# Patient Record
Sex: Female | Born: 1960
Health system: Southern US, Community
[De-identification: ages and names within clinical notes are randomized; demographics above are authoritative.]

## PROBLEM LIST (undated history)

## (undated) DIAGNOSIS — G35D Multiple sclerosis, unspecified: Secondary | ICD-10-CM

## (undated) DIAGNOSIS — B1920 Unspecified viral hepatitis C without hepatic coma: Secondary | ICD-10-CM

## (undated) DIAGNOSIS — G35 Multiple sclerosis: Secondary | ICD-10-CM

## (undated) DIAGNOSIS — F32A Depression, unspecified: Secondary | ICD-10-CM

## (undated) DIAGNOSIS — G8929 Other chronic pain: Secondary | ICD-10-CM

## (undated) DIAGNOSIS — Z5189 Encounter for other specified aftercare: Secondary | ICD-10-CM

## (undated) DIAGNOSIS — G9050 Complex regional pain syndrome I, unspecified: Secondary | ICD-10-CM

## (undated) DIAGNOSIS — J189 Pneumonia, unspecified organism: Secondary | ICD-10-CM

## (undated) DIAGNOSIS — F419 Anxiety disorder, unspecified: Secondary | ICD-10-CM

## (undated) DIAGNOSIS — I82409 Acute embolism and thrombosis of unspecified deep veins of unspecified lower extremity: Secondary | ICD-10-CM

## (undated) DIAGNOSIS — R011 Cardiac murmur, unspecified: Secondary | ICD-10-CM

## (undated) DIAGNOSIS — K5900 Constipation, unspecified: Secondary | ICD-10-CM

## (undated) DIAGNOSIS — J40 Bronchitis, not specified as acute or chronic: Secondary | ICD-10-CM

## (undated) DIAGNOSIS — R51 Headache: Secondary | ICD-10-CM

## (undated) DIAGNOSIS — M542 Cervicalgia: Secondary | ICD-10-CM

## (undated) DIAGNOSIS — G459 Transient cerebral ischemic attack, unspecified: Secondary | ICD-10-CM

## (undated) DIAGNOSIS — F329 Major depressive disorder, single episode, unspecified: Secondary | ICD-10-CM

## (undated) HISTORY — PX: APPENDECTOMY: SHX54

## (undated) HISTORY — PX: COLONOSCOPY: SHX174

## (undated) HISTORY — PX: ABDOMINAL HYSTERECTOMY: SHX81

## (undated) HISTORY — PX: ANKLE SURGERY: SHX546

## (undated) HISTORY — PX: WEDGE RESECTION: SHX5070

## (undated) HISTORY — PX: NECK SURGERY: SHX720

---

## 1999-08-28 ENCOUNTER — Encounter: Admission: RE | Admit: 1999-08-28 | Discharge: 1999-08-28 | Payer: Self-pay | Admitting: Family Medicine

## 1999-08-28 ENCOUNTER — Encounter: Payer: Self-pay | Admitting: Family Medicine

## 2000-07-07 ENCOUNTER — Other Ambulatory Visit: Admission: RE | Admit: 2000-07-07 | Discharge: 2000-07-07 | Payer: Self-pay | Admitting: Obstetrics and Gynecology

## 2001-02-24 ENCOUNTER — Encounter: Payer: Self-pay | Admitting: Obstetrics and Gynecology

## 2001-02-24 ENCOUNTER — Inpatient Hospital Stay (HOSPITAL_COMMUNITY): Admission: AD | Admit: 2001-02-24 | Discharge: 2001-02-24 | Payer: Self-pay | Admitting: Obstetrics and Gynecology

## 2001-03-06 ENCOUNTER — Encounter (INDEPENDENT_AMBULATORY_CARE_PROVIDER_SITE_OTHER): Payer: Self-pay

## 2001-03-06 ENCOUNTER — Ambulatory Visit (HOSPITAL_COMMUNITY): Admission: RE | Admit: 2001-03-06 | Discharge: 2001-03-06 | Payer: Self-pay | Admitting: Obstetrics and Gynecology

## 2001-08-02 ENCOUNTER — Emergency Department (HOSPITAL_COMMUNITY): Admission: EM | Admit: 2001-08-02 | Discharge: 2001-08-02 | Payer: Self-pay | Admitting: Emergency Medicine

## 2001-08-26 ENCOUNTER — Encounter: Payer: Self-pay | Admitting: Family Medicine

## 2001-08-26 ENCOUNTER — Encounter: Admission: RE | Admit: 2001-08-26 | Discharge: 2001-08-26 | Payer: Self-pay | Admitting: Family Medicine

## 2002-03-24 ENCOUNTER — Other Ambulatory Visit: Admission: RE | Admit: 2002-03-24 | Discharge: 2002-03-24 | Payer: Self-pay | Admitting: Obstetrics and Gynecology

## 2002-04-08 ENCOUNTER — Encounter: Payer: Self-pay | Admitting: Family Medicine

## 2002-04-08 ENCOUNTER — Encounter: Admission: RE | Admit: 2002-04-08 | Discharge: 2002-04-08 | Payer: Self-pay | Admitting: Family Medicine

## 2002-10-12 ENCOUNTER — Encounter (INDEPENDENT_AMBULATORY_CARE_PROVIDER_SITE_OTHER): Payer: Self-pay | Admitting: Specialist

## 2002-10-12 ENCOUNTER — Encounter: Payer: Self-pay | Admitting: Obstetrics and Gynecology

## 2002-10-12 ENCOUNTER — Inpatient Hospital Stay (HOSPITAL_COMMUNITY): Admission: RE | Admit: 2002-10-12 | Discharge: 2002-10-15 | Payer: Self-pay | Admitting: Obstetrics and Gynecology

## 2002-10-14 ENCOUNTER — Encounter: Payer: Self-pay | Admitting: Obstetrics and Gynecology

## 2002-10-16 ENCOUNTER — Emergency Department (HOSPITAL_COMMUNITY): Admission: EM | Admit: 2002-10-16 | Discharge: 2002-10-17 | Payer: Self-pay | Admitting: Emergency Medicine

## 2002-10-16 ENCOUNTER — Encounter: Payer: Self-pay | Admitting: Emergency Medicine

## 2003-04-15 ENCOUNTER — Emergency Department (HOSPITAL_COMMUNITY): Admission: EM | Admit: 2003-04-15 | Discharge: 2003-04-15 | Payer: Self-pay | Admitting: Emergency Medicine

## 2003-05-10 ENCOUNTER — Other Ambulatory Visit: Admission: RE | Admit: 2003-05-10 | Discharge: 2003-05-10 | Payer: Self-pay | Admitting: Obstetrics and Gynecology

## 2003-08-24 ENCOUNTER — Encounter: Payer: Self-pay | Admitting: Family Medicine

## 2003-08-24 ENCOUNTER — Encounter: Admission: RE | Admit: 2003-08-24 | Discharge: 2003-08-24 | Payer: Self-pay | Admitting: Family Medicine

## 2003-10-27 ENCOUNTER — Emergency Department (HOSPITAL_COMMUNITY): Admission: EM | Admit: 2003-10-27 | Discharge: 2003-10-27 | Payer: Self-pay | Admitting: Emergency Medicine

## 2003-11-02 ENCOUNTER — Encounter: Admission: RE | Admit: 2003-11-02 | Discharge: 2003-11-02 | Payer: Self-pay | Admitting: Family Medicine

## 2004-07-11 ENCOUNTER — Other Ambulatory Visit: Admission: RE | Admit: 2004-07-11 | Discharge: 2004-07-11 | Payer: Self-pay | Admitting: Obstetrics and Gynecology

## 2004-08-25 ENCOUNTER — Emergency Department (HOSPITAL_COMMUNITY): Admission: EM | Admit: 2004-08-25 | Discharge: 2004-08-26 | Payer: Self-pay | Admitting: Emergency Medicine

## 2004-10-30 ENCOUNTER — Ambulatory Visit: Payer: Self-pay | Admitting: Gastroenterology

## 2004-11-19 ENCOUNTER — Ambulatory Visit: Payer: Self-pay | Admitting: Gastroenterology

## 2004-11-23 ENCOUNTER — Encounter (INDEPENDENT_AMBULATORY_CARE_PROVIDER_SITE_OTHER): Payer: Self-pay | Admitting: *Deleted

## 2004-11-23 ENCOUNTER — Ambulatory Visit (HOSPITAL_COMMUNITY): Admission: RE | Admit: 2004-11-23 | Discharge: 2004-11-23 | Payer: Self-pay | Admitting: Gastroenterology

## 2004-12-06 ENCOUNTER — Ambulatory Visit: Payer: Self-pay | Admitting: Internal Medicine

## 2004-12-13 ENCOUNTER — Ambulatory Visit: Payer: Self-pay | Admitting: Gastroenterology

## 2004-12-27 ENCOUNTER — Ambulatory Visit: Payer: Self-pay | Admitting: Gastroenterology

## 2005-01-10 ENCOUNTER — Ambulatory Visit: Payer: Self-pay | Admitting: Internal Medicine

## 2005-01-24 ENCOUNTER — Ambulatory Visit: Payer: Self-pay | Admitting: Gastroenterology

## 2005-02-28 ENCOUNTER — Ambulatory Visit: Payer: Self-pay | Admitting: Gastroenterology

## 2005-03-28 ENCOUNTER — Ambulatory Visit: Payer: Self-pay | Admitting: Internal Medicine

## 2005-05-11 ENCOUNTER — Emergency Department (HOSPITAL_COMMUNITY): Admission: EM | Admit: 2005-05-11 | Discharge: 2005-05-12 | Payer: Self-pay | Admitting: Emergency Medicine

## 2005-08-30 ENCOUNTER — Encounter (INDEPENDENT_AMBULATORY_CARE_PROVIDER_SITE_OTHER): Payer: Self-pay | Admitting: Specialist

## 2005-08-30 ENCOUNTER — Ambulatory Visit (HOSPITAL_COMMUNITY): Admission: RE | Admit: 2005-08-30 | Discharge: 2005-08-30 | Payer: Self-pay | Admitting: Obstetrics and Gynecology

## 2006-08-28 ENCOUNTER — Inpatient Hospital Stay (HOSPITAL_COMMUNITY): Admission: RE | Admit: 2006-08-28 | Discharge: 2006-08-30 | Payer: Self-pay | Admitting: Obstetrics and Gynecology

## 2006-08-28 ENCOUNTER — Encounter (INDEPENDENT_AMBULATORY_CARE_PROVIDER_SITE_OTHER): Payer: Self-pay | Admitting: *Deleted

## 2006-12-27 ENCOUNTER — Emergency Department (HOSPITAL_COMMUNITY): Admission: EM | Admit: 2006-12-27 | Discharge: 2006-12-27 | Payer: Self-pay | Admitting: Family Medicine

## 2007-11-12 DIAGNOSIS — G9050 Complex regional pain syndrome I, unspecified: Secondary | ICD-10-CM

## 2007-11-12 HISTORY — DX: Complex regional pain syndrome I, unspecified: G90.50

## 2007-12-14 ENCOUNTER — Encounter: Admission: RE | Admit: 2007-12-14 | Discharge: 2007-12-14 | Payer: Self-pay | Admitting: Family Medicine

## 2008-05-11 ENCOUNTER — Emergency Department (HOSPITAL_COMMUNITY): Admission: EM | Admit: 2008-05-11 | Discharge: 2008-05-11 | Payer: Self-pay | Admitting: Emergency Medicine

## 2008-06-08 ENCOUNTER — Encounter: Admission: RE | Admit: 2008-06-08 | Discharge: 2008-06-08 | Payer: Self-pay | Admitting: Occupational Medicine

## 2008-07-09 ENCOUNTER — Emergency Department (HOSPITAL_COMMUNITY): Admission: EM | Admit: 2008-07-09 | Discharge: 2008-07-09 | Payer: Self-pay | Admitting: Emergency Medicine

## 2008-07-13 ENCOUNTER — Encounter: Admission: RE | Admit: 2008-07-13 | Discharge: 2008-08-08 | Payer: Self-pay | Admitting: Internal Medicine

## 2008-08-18 ENCOUNTER — Encounter: Admission: RE | Admit: 2008-08-18 | Discharge: 2008-08-18 | Payer: Self-pay | Admitting: Orthopedic Surgery

## 2008-09-11 ENCOUNTER — Encounter: Admission: RE | Admit: 2008-09-11 | Discharge: 2008-09-11 | Payer: Self-pay | Admitting: Orthopedic Surgery

## 2009-02-18 ENCOUNTER — Emergency Department (HOSPITAL_COMMUNITY): Admission: EM | Admit: 2009-02-18 | Discharge: 2009-02-18 | Payer: Self-pay | Admitting: Emergency Medicine

## 2009-04-25 ENCOUNTER — Encounter: Admission: RE | Admit: 2009-04-25 | Discharge: 2009-04-25 | Payer: Self-pay | Admitting: Obstetrics and Gynecology

## 2009-06-23 ENCOUNTER — Emergency Department (HOSPITAL_COMMUNITY): Admission: EM | Admit: 2009-06-23 | Discharge: 2009-06-23 | Payer: Self-pay | Admitting: Emergency Medicine

## 2009-06-26 ENCOUNTER — Emergency Department (HOSPITAL_COMMUNITY): Admission: EM | Admit: 2009-06-26 | Discharge: 2009-06-26 | Payer: Self-pay | Admitting: Emergency Medicine

## 2010-04-18 ENCOUNTER — Encounter: Admission: RE | Admit: 2010-04-18 | Discharge: 2010-04-18 | Payer: Self-pay | Admitting: Family Medicine

## 2010-07-30 ENCOUNTER — Inpatient Hospital Stay (HOSPITAL_COMMUNITY): Admission: EM | Admit: 2010-07-30 | Discharge: 2010-08-02 | Payer: Self-pay | Admitting: Family Medicine

## 2010-07-31 ENCOUNTER — Encounter (INDEPENDENT_AMBULATORY_CARE_PROVIDER_SITE_OTHER): Payer: Self-pay | Admitting: Internal Medicine

## 2010-07-31 ENCOUNTER — Ambulatory Visit: Payer: Self-pay | Admitting: Vascular Surgery

## 2010-09-10 ENCOUNTER — Ambulatory Visit (HOSPITAL_COMMUNITY): Admission: RE | Admit: 2010-09-10 | Discharge: 2010-09-11 | Payer: Self-pay | Admitting: Neurosurgery

## 2010-10-16 ENCOUNTER — Ambulatory Visit (HOSPITAL_COMMUNITY)
Admission: RE | Admit: 2010-10-16 | Discharge: 2010-10-16 | Payer: Self-pay | Source: Home / Self Care | Attending: Family Medicine | Admitting: Family Medicine

## 2010-10-16 ENCOUNTER — Encounter (INDEPENDENT_AMBULATORY_CARE_PROVIDER_SITE_OTHER): Payer: Self-pay | Admitting: Family Medicine

## 2010-12-01 ENCOUNTER — Encounter: Payer: Self-pay | Admitting: Family Medicine

## 2010-12-28 ENCOUNTER — Other Ambulatory Visit: Payer: Self-pay | Admitting: Gastroenterology

## 2011-01-23 LAB — CBC
HCT: 44.9 % (ref 36.0–46.0)
Hemoglobin: 16.2 g/dL — ABNORMAL HIGH (ref 12.0–15.0)
MCH: 31.8 pg (ref 26.0–34.0)
MCHC: 36.1 g/dL — ABNORMAL HIGH (ref 30.0–36.0)
MCV: 88.2 fL (ref 78.0–100.0)
Platelets: 228 10*3/uL (ref 150–400)
RBC: 5.09 MIL/uL (ref 3.87–5.11)
RDW: 12.4 % (ref 11.5–15.5)
WBC: 3 10*3/uL — ABNORMAL LOW (ref 4.0–10.5)

## 2011-01-23 LAB — BASIC METABOLIC PANEL
BUN: 5 mg/dL — ABNORMAL LOW (ref 6–23)
CO2: 28 mEq/L (ref 19–32)
Calcium: 9.6 mg/dL (ref 8.4–10.5)
Chloride: 108 mEq/L (ref 96–112)
Creatinine, Ser: 0.77 mg/dL (ref 0.4–1.2)
GFR calc Af Amer: >60 mL/min (ref >60)
GFR calc non Af Amer: >60 mL/min (ref >60)
Glucose, Bld: 124 mg/dL — ABNORMAL HIGH (ref 70–99)
Potassium: 5.1 mEq/L (ref 3.5–5.1)
Sodium: 140 mEq/L (ref 135–145)

## 2011-01-23 LAB — SURGICAL PCR SCREEN
MRSA, PCR: NEGATIVE
Staphylococcus aureus: NEGATIVE

## 2011-01-23 LAB — URINALYSIS, ROUTINE W REFLEX MICROSCOPIC
Glucose, UA: NEGATIVE mg/dL
Hgb urine dipstick: NEGATIVE
Protein, ur: NEGATIVE mg/dL
Specific Gravity, Urine: 1.004 — ABNORMAL LOW (ref 1.005–1.030)

## 2011-01-23 LAB — PROTIME-INR
INR: 0.97 (ref 0.00–1.49)
Prothrombin Time: 13.1 seconds (ref 11.6–15.2)

## 2011-01-23 LAB — APTT: aPTT: 33 seconds (ref 24–37)

## 2011-01-24 LAB — FUNGUS CULTURE W SMEAR: Fungal Smear: NONE SEEN

## 2011-01-24 LAB — COMPREHENSIVE METABOLIC PANEL
ALT: 37 U/L — ABNORMAL HIGH (ref 0–35)
Albumin: 3.5 g/dL (ref 3.5–5.2)
Alkaline Phosphatase: 121 U/L — ABNORMAL HIGH (ref 39–117)
Potassium: 4.1 mEq/L (ref 3.5–5.1)
Sodium: 138 mEq/L (ref 135–145)
Total Protein: 6.6 g/dL (ref 6.0–8.3)

## 2011-01-24 LAB — PROTEIN AND GLUCOSE, CSF
Glucose, CSF: 62 mg/dL (ref 43–76)
Total  Protein, CSF: 40 mg/dL (ref 15–45)

## 2011-01-24 LAB — GLUCOSE, CAPILLARY
Glucose-Capillary: 103 mg/dL — ABNORMAL HIGH (ref 70–99)
Glucose-Capillary: 110 mg/dL — ABNORMAL HIGH (ref 70–99)
Glucose-Capillary: 89 mg/dL (ref 70–99)

## 2011-01-24 LAB — PROTIME-INR
INR: 0.95 (ref 0.00–1.49)
Prothrombin Time: 12.9 seconds (ref 11.6–15.2)

## 2011-01-24 LAB — ANTI-DNA ANTIBODY, DOUBLE-STRANDED: ds DNA Ab: 1 IU/mL (ref <30)

## 2011-01-24 LAB — CBC
Platelets: 205 10*3/uL (ref 150–400)
RDW: 12.3 % (ref 11.5–15.5)
WBC: 5 10*3/uL (ref 4.0–10.5)

## 2011-01-24 LAB — CSF CELL COUNT WITH DIFFERENTIAL: Tube #: 4

## 2011-01-24 LAB — CSF CULTURE W GRAM STAIN
Culture: NO GROWTH
Gram Stain: NONE SEEN

## 2011-01-24 LAB — SJOGRENS SYNDROME-A EXTRACTABLE NUCLEAR ANTIBODY: SSA (Ro) (ENA) Antibody, IgG: <1 AU/mL (ref <30)

## 2011-01-24 LAB — C-REACTIVE PROTEIN: CRP: 0 mg/dL — ABNORMAL LOW (ref <0.6)

## 2011-01-24 LAB — CARDIAC PANEL(CRET KIN+CKTOT+MB+TROPI)
Relative Index: 1.4 (ref 0.0–2.5)
Total CK: 337 U/L — ABNORMAL HIGH (ref 7–177)
Troponin I: 0.01 ng/mL (ref 0.00–0.06)

## 2011-01-24 LAB — LIPID PANEL
Cholesterol: 145 mg/dL (ref 0–200)
HDL: 71 mg/dL (ref >39)
LDL Cholesterol: 60 mg/dL (ref 0–99)
Total CHOL/HDL Ratio: 2 RATIO

## 2011-01-24 LAB — SJOGRENS SYNDROME-B EXTRACTABLE NUCLEAR ANTIBODY: SSB (La) (ENA) Antibody, IgG: <1 AU/mL (ref <30)

## 2011-01-24 LAB — PROTEIN ELECTROPH W RFLX QUANT IMMUNOGLOBULINS
Albumin ELP: 55.8 % (ref 55.8–66.1)
Alpha-1-Globulin: 4.7 % (ref 2.9–4.9)
Total Protein ELP: 6.8 g/dL (ref 6.0–8.3)

## 2011-01-24 LAB — HEMOGLOBIN A1C
Hgb A1c MFr Bld: 5.8 % — ABNORMAL HIGH (ref <5.7)
Mean Plasma Glucose: 120 mg/dL — ABNORMAL HIGH (ref <117)

## 2011-01-24 LAB — ANTI-NEUTROPHIL ANTIBODY

## 2011-01-24 LAB — ANA: Anti Nuclear Antibody(ANA): NEGATIVE

## 2011-01-24 LAB — HIV ANTIBODY (ROUTINE TESTING W REFLEX): HIV: NONREACTIVE

## 2011-02-20 LAB — DIFFERENTIAL
Basophils Absolute: 0.1 10*3/uL (ref 0.0–0.1)
Basophils Relative: 1 % (ref 0–1)
Monocytes Relative: 8 % (ref 3–12)
Neutro Abs: 2.7 10*3/uL (ref 1.7–7.7)
Neutrophils Relative %: 56 % (ref 43–77)

## 2011-02-20 LAB — CBC
HCT: 44.6 % (ref 36.0–46.0)
Hemoglobin: 15.6 g/dL — ABNORMAL HIGH (ref 12.0–15.0)
MCHC: 35 g/dL (ref 30.0–36.0)
MCV: 92.5 fL (ref 78.0–100.0)
RDW: 12.1 % (ref 11.5–15.5)

## 2011-02-20 LAB — URINALYSIS, ROUTINE W REFLEX MICROSCOPIC
Bilirubin Urine: NEGATIVE
Glucose, UA: NEGATIVE mg/dL
Hgb urine dipstick: NEGATIVE
Ketones, ur: NEGATIVE mg/dL
Nitrite: NEGATIVE
Protein, ur: NEGATIVE mg/dL
Specific Gravity, Urine: 1.009 (ref 1.005–1.030)
Urobilinogen, UA: 1 mg/dL (ref 0.0–1.0)
pH: 7 (ref 5.0–8.0)

## 2011-02-20 LAB — RAPID URINE DRUG SCREEN, HOSP PERFORMED
Barbiturates: NOT DETECTED
Opiates: NOT DETECTED

## 2011-02-20 LAB — POCT PREGNANCY, URINE: Preg Test, Ur: NEGATIVE

## 2011-02-20 LAB — COMPREHENSIVE METABOLIC PANEL
Alkaline Phosphatase: 119 U/L — ABNORMAL HIGH (ref 39–117)
BUN: 6 mg/dL (ref 6–23)
Creatinine, Ser: 0.77 mg/dL (ref 0.4–1.2)
Glucose, Bld: 95 mg/dL (ref 70–99)
Potassium: 3.4 mEq/L — ABNORMAL LOW (ref 3.5–5.1)
Total Protein: 7.5 g/dL (ref 6.0–8.3)

## 2011-02-20 LAB — TSH: TSH: 1.18 u[IU]/mL (ref 0.350–4.500)

## 2011-02-20 LAB — T4, FREE: Free T4: 0.97 ng/dL (ref 0.80–1.80)

## 2011-03-29 NOTE — Discharge Summary (Signed)
Becky Wolfe, Becky Wolfe               ACCOUNT NO.:  [id number]   MEDICAL RECORD NO.:  [id number]          PATIENT TYPE:  INP   LOCATION:  9311                          FACILITY:  [facility]   PHYSICIAN:  Juluis Mire, M.D.   DATE OF BIRTH:  10/01/1961   DATE OF ADMISSION:  08/28/2006  DATE OF DISCHARGE:  08/30/2006                                 DISCHARGE SUMMARY   ADMITTING DIAGNOSIS:  Ovarian entrapment with chronic cyst formation.   DISCHARGE DIAGNOSIS:  Ovarian entrapment with chronic cyst formation.   OPERATIVE PROCEDURE:  Open laparoscopy with subsequent exploratory  laparotomy, left salpingo-oophorectomy, and repair of cystotomy site.   HISTORY AND PHYSICAL:  For complete history and physical, please dictate  note.   COURSE IN HOSPITAL:  The patient underwent open laparoscopy.  We were  dissecting the ovary.  It was noted that a hole was made in the bladder.  We  subsequently did exploratory surgery, removed the left tube and ovary, and  repaired the cystotomy site.  Postoperatively, the patient did well and was  discharged home on her second postoperative day.  At that time, she was  afebrile with stable vital signs.  Her abdomen was soft and nontender.  Bowel sounds were active.  All incisions were clear.  Urine was draining  clearly into the Foley.  Postoperative hemoglobin was 11.9.   COMPLICATIONS:  No __________.  The patient was discharged home in stable  condition.   DISPOSITION:  Routine postoperative instructions were given.  She is to  avoid heavy lifting or driving a car.  She is to watch for signs of  infection, nausea, vomiting, increase in abdominal pain, or sign of deep  venous thrombosis or pulmonary embolus.  She will follow up in the office  early next week where her Foley will be removed and staples removed.  Discharged home with Tylox as needed for pain and prophylactic antibiotic in  the form of ciprofloxacin.      Juluis Mire, M.D.  Electronically  Signed     JSM/MEDQ  D:  08/30/2006  T:  09/01/2006  Job:  [id number]

## 2011-03-29 NOTE — H&P (Signed)
NAME:  Becky Wolfe, Becky Wolfe NO.:  [id number]   MEDICAL RECORD NO.:  [id number]          PATIENT TYPE:  INP   LOCATION:                                FACILITY:  [facility]   PHYSICIAN:  Juluis Mire, M.D.   DATE OF BIRTH:  18-Jun-1961   DATE OF ADMISSION:  DATE OF DISCHARGE:                                HISTORY & PHYSICAL   The patient is a 50 year old gravida 2, para 2 female who presents for  laparoscopic evaluation, possible left salpingo-oophorectomy, possible  exploratory laparotomy with removal of left tube and ovary.   In relation to the present admission the patient has had a longstanding  history of pelvic adhesions and pelvic pain. Because of this in 2003 she  underwent a total abdominal hysterectomy. It is of note that her right ovary  has been previously removed in 1994 due to recurrent ovarian cysts. She  evidently had a wedge resection done at that time of the other ovary. Over  the past times since the hysterectomy she has had recurrent left ovarian  entrapment with cyst formation and pain. She underwent a previous  laparoscopic evaluation last year. We removed the cyst and freed up the  ovary. Now she has had recurrent bouts with pain and cysts. She has had a  persistent 4 to 5 cm cyst with extensive pain requiring narcotics. In view  of this the patient now presents for the above-noted surgery.   ALLERGIES:  No known drug allergies.   MEDICATIONS:  None.   PAST MEDICAL HISTORY:  Childhood diseases. No significant sequelae.   PAST SURGICAL HISTORY:  She has had cesarean section done in 1993 and 1998.  In 1994 she had the right ovary removed. In 1983 she had exploratory  surgery. Read resection of both ovaries. She has had two previous diagnostic  laparoscopies. As noted above had the hysterectomy done.   FAMILY HISTORY:  Father has a history of diabetes. Mother with history of  hypertension.   SOCIAL HISTORY:  Does reveal tobacco use but no alcohol  use.   REVIEW OF SYSTEMS:  Noncontributory.   PHYSICAL EXAMINATION:  VITAL SIGNS:  The patient is afebrile, stable vital  signs.  HEENT:  Normocephalic. Pupils are equal, round, react to light and  accommodation. Extraocular movements were intact. Sclerae and conjunctivae  are clear. Oropharynx clear.  NECK:  Without thyromegaly.  BREASTS:  No discrete masses.  LUNGS:  Clear.  CARDIOVASCULAR:  Regular rate. No murmurs or gallops.  ABDOMEN:  Benign. No masses, organomegaly or tenderness.  PELVIC:  Normal external genitalia. Vaginal mucosa is clear. Cuff intact.  Bimanual exam unremarkable. Left-sided fullness.  EXTREMITIES:  Trace edema.  NEUROLOGIC:  Grossly within normal limits.   IMPRESSION:  Left ovarian entrapment with persistent cystic enlargement and  pain.   PLAN:  The patient to undergo the above noted surgery.   PLAN:  To remove the ovary either laparoscopically or through an abdominal  incision. She does understand this will lead to hormonal withdrawal and  menopausal symptoms that could require estrogen replacement. Risks of  surgery  have been discussed including risk of infection, risk of hemorrhage,  could require transfusion, risk of AIDS or hepatitis, risk of injury to  adjacent organs including bladder, bowel or ureters could require further  exploratory surgery. Risk of deep venous thrombosis and pulmonary embolus.  The patient appears to understand indications and risks.      Juluis Mire, M.D.  Electronically Signed     JSM/MEDQ  D:  08/28/2006  T:  08/28/2006  Job:  [id number]

## 2011-03-29 NOTE — Op Note (Signed)
NAME:  Becky Wolfe, Becky Wolfe                         ACCOUNT NO.:  [id number]   MEDICAL RECORD NO.:  [id number]                   PATIENT TYPE:  INP   LOCATION:  9323                                 FACILITY:  [facility]   PHYSICIAN:  Juluis Mire, M.D.                DATE OF BIRTH:  07-Jun-1961   DATE OF PROCEDURE:  10/12/2002  DATE OF DISCHARGE:                                 OPERATIVE REPORT   PREOPERATIVE DIAGNOSES:  Menorrhagia and pelvic pain thought to be secondary  to pelvic adhesions and uterine adenomyosis.   POSTOPERATIVE DIAGNOSES:  1. Menorrhagia and pelvic pain thought to be secondary to pelvic adhesions     and uterine adenomyosis.  2. Left ovarian cyst.   OPERATIVE PROCEDURE:  1. Exploratory laparotomy and lysis of adhesions.  2. Left ovarian cystectomy.  3. Total abdominal hysterectomy.   SURGEON:  Juluis Mire, M.D.   ANESTHESIA:  General endotracheal.   ESTIMATED BLOOD LOSS:  300 cc.   PACKS AND DRAINS:  None.   INTRAOPERATIVE BLOOD PLACED:  None.   COMPLICATIONS:  None.   INDICATIONS:  Dictated in the history and physical.   PROCEDURE:  The patient was taken to the OR and placed in supine position.  After a satisfactory level of general endotracheal anesthesia was obtained,  the patient's abdomen, perineum, and vagina were prepped out with Betadine  and draped as a sterile field.  A prior low transverse skin incision was  excised.  This incision was extended through subcutaneous tissue.  The  fascia was entered sharply and the incision of the fascia extended  laterally.  The fascia taken off the muscles superiorly and inferiorly.  Rectus muscles were separated in the midline.  The anterior peritoneum was  entered.  Dense omental adhesions were noted to the peritoneum.  We  gradually were able to extend the incision of the peritoneum both superiorly  and inferiorly as we took down the omental adhesions.  No bowel was  encountered in these adhesions.   Eventually freed up enough of the omentum  to place in the O'Connor-O-Sullivan retractor and bowel contents were packed  superiorly out of the pelvic cavity.  The uterus was elevated.  The right  tube and ovary were surgically absent.  There were some omental adhesions to  the left side.  These were also taken down.  Left ovary had a 3-4 cm cyst.  We did a left ovarian cystectomy.  Part of the cyst wall was sent for  pathologic review.  Appeared to be a corpus luteum.  We did cauterize the  ovarian cystotomy site and oversewn it with 3-0 PDS.  With this we had good  hemostasis.  At this point in time the right round ligament was clamped,  cut, and suture ligated with 0 Vicryl.  The bladder flap was developed.  The  left round ligament was clamped, cut, and suture  ligated with 0 Vicryl.  The  left utero-ovarian pedicle was isolated, clamped, cut, and doubly ligated,  first with a free tie of 0 Vicryl, then a suture ligature of 0 Vicryl.  Bladder flap was fully developed.  Uterine vessels were skeletonized,  clamped, cut, and suture ligated with 0 Vicryl.  Using the clamp, cut, and  tie technique with suture ligature of 0 Vicryl, the parametrium was serially  separated from the side of the uterus.  Vaginal angles were clamped, cut,  and the intervening vaginal mucosa was excised.  The uterus was passed off  the operative field.  Held angles were secured with suture ligatures of 0  Vicryl and intervening vaginal mucosa with two figure-of-eights and 0  Vicryl.  Pelvic area was irrigated.  We had good hemostasis.  The left ovary  was secured to the left round ligament, elevated out of the pelvis.  We  thoroughly irrigated the pelvis.  Hemostasis was excellent.  At this point  in time we looked for the appendix.  We found the cecum, but could not see  the appendix.  It may have been surgically removed at the time of her right  ovarian cystectomy.  At this point in time we reirrigated.  We had  good  hemostasis.  Urine output remained clear, but concentrated.  All packs and  the self retaining retractor were removed.  The muscles reapproximated with  a running suture of 3-0 Vicryl.  Fascia closed with running suture of 0 PDS.  We mobilized the skin and then closed the skin with staples and Steri-  Strips.  Sponge, instrument, needle count reported as correct by circulating  nurse x2.  Foley catheter remained clear at time of closure.  The patient  did tolerate procedure well.  Was returned to recovery room in excellent  condition.                                               Juluis Mire, M.D.    JSM/MEDQ  D:  10/12/2002  T:  10/12/2002  Job:  [id number]

## 2011-03-29 NOTE — H&P (Signed)
NAME:  Becky Wolfe, Becky Wolfe                         ACCOUNT NO.:  [id number]   MEDICAL RECORD NO.:  [id number]                   PATIENT TYPE:  INP   LOCATION:  9323                                 FACILITY:  [facility]   PHYSICIAN:  Juluis Mire, M.D.                DATE OF BIRTH:  [birth year]   DATE OF ADMISSION:  10/12/2002  DATE OF DISCHARGE:                                HISTORY & PHYSICAL   HISTORY OF PRESENT ILLNESS:  The patient is a 50 year old gravida 2, para 2  married black female who presents for a total abdominal hysterectomy for  management of known pelvic adhesions with associated continued pelvic pain.   In relation to the present admission, patient had a previous diagnostic  laparoscopy done in April 2002 with finding of significant pelvic adhesions  and probable uterine adenomyosis.  It is of note in 1994 she had a previous  right salpingo-oophorectomy done for an ovarian cyst and she has also had  previous ovarian wedge resection.  Her cycles are every 28 days.  She has  three days of flow that have been heavier than normal.  Flow requires the  use of a pad every hour or less.  This is associated with significant clots  and increasing pain.  She has been using over-the-counter agents without  response.  Ultrasound evaluation has revealed overall uterine enlargement  but no fibroids have been noted.  These symptoms are extremely limiting from  the patient's standpoint.  Due to the age and use of tobacco, birth control  pills are contraindicated.  In view of this, the patient now presents for  total abdominal hysterectomy.   ALLERGIES:  She has no known drug allergies.   MEDICATIONS:  None.   PAST MEDICAL HISTORY:  Usual childhood diseases without any significant  sequelae.   PAST SURGICAL HISTORY:  She had a cesarean section done in 1993.  She had a  follow-up cesarean section done in 1998.  She had a previous right  oophorectomy done in 1994.  In 1983 she had  exploratory surgery and wedge  resection of both ovaries and as noted, she had previous laparoscopy.   FAMILY HISTORY:  Father has a history of borderline diabetes.  Mother with a  history of hypertension.   SOCIAL HISTORY:  Does reveal two to three cigarettes per day and occasional  alcohol use.   REVIEW OF SYSTEMS:  Noncontributory.   PHYSICAL EXAMINATION:  VITAL SIGNS:  The patient is afebrile with stable  vital signs.  HEENT:  The patient is normocephalic.  Pupils are equal, round, and reactive  to light and accommodation.  Extraocular movements are intact.  Sclerae and  conjunctivae clear.  Oropharynx clear.  NECK:  Without thyromegaly.  BREASTS:  No discreet masses.  LUNGS:  Clear.  CARDIOVASCULAR:  Regular rhythm and rate without murmurs or gallops.  ABDOMEN:  Benign.  No mass, organomegaly,  or tenderness.  Well healed low  transverse incision.  PELVIC:  Normal external genitalia.  Vaginal mucosa is clear.  Cervix  unremarkable.  Uterus upper limits of normal size.  Adnexa unremarkable.  Rectovaginal examination is clear.  EXTREMITIES:  Trace edema.  NEUROLOGIC:  Grossly within normal limits.   IMPRESSION:  Abnormal uterine bleeding, pelvic pain possibly secondary to  uterine adenomyosis or pelvic adhesions.   PLAN:  The patient will undergo total abdominal hysterectomy.  Remaining  ovary will be left in place at patient's request.  Potential malignant  transformation has been discussed.  Risks of surgery have been explained  including the risk of infection, the risk of hemorrhage that could  necessitate transfusion with the risk of AIDS or hepatitis, the risk of  injury to adjacent organs including bladder, bowel, ureters that could  require further exploratory surgery, the risk of deep venous thrombosis and  pulmonary embolus.  The patient expressed understanding of indications and  risks.                                               Juluis Mire, M.D.     JSM/MEDQ  D:  10/12/2002  T:  10/12/2002  Job:  [id number]

## 2011-03-29 NOTE — H&P (Signed)
Becky Wolfe, NOURI NO.:  [id number]   MEDICAL RECORD NO.:  [id number]          PATIENT TYPE:  AMB   LOCATION:  SDC                           FACILITY:  [facility]   PHYSICIAN:  Juluis Mire, M.D.   DATE OF BIRTH:  05/08/1961   DATE OF ADMISSION:  08/30/2005  DATE OF DISCHARGE:                                HISTORY & PHYSICAL   The patient is a 50 year old gravida 2, para 2, female who presents for  diagnostic laparoscopy with laser standby.   In relation to the present admission, the patient's past history is  significant in that in 1982 she had removal of the right ovary for  persistent cystic enlargement.  Subsequently because of pelvic pain and  associated abnormal bleeding, she did undergo follow-up laparoscopy.  She  had a corpus luteal cyst of the left ovary and extensive pelvic adhesions.  Because of this, she subsequently in 2003 had a total abdominal  hysterectomy.  She also had a recurrent corpus luteal cyst of the left  ovary, but everything else was relatively unremarkable.  She had been doing  relatively well from that standpoint.  However, on exam in August of this  year, she had a left-sided fullness.  We did a sonographic evaluation, and  she had a 3.4 x 2.4 cm cyst of the remaining left ovary.  She was having  some problem with pain at that time.  It subsequently resolved on follow-up.  She has had recurrent pain and discomfort.  On repeat ultrasound evaluation  she is found to have a 4.7 x 4.6 cm cyst of the left ovary in addition to a  3.6 cm cyst.  These may be functional in origin.  The patient is having  significant pain.  We have discussed with her surgically removing the left  ovary to prevent the recurrent cyst.  This may be secondary to adhesions  around the ovary.  She really just wants the cyst removed, which we are  going to proceed at this point in time.  Her CA125 is negative.   ALLERGIES:  No known drug allergies.   MEDICATIONS:   None.   PAST MEDICAL HISTORY:  She had the usual childhood diseases without any  significant sequelae.   PAST SURGICAL HISTORY:  She has had two cesarean sections.  In 1994 she had  her right ovary removed due to persistent cystic enlargement.  In 1983 she  had a wedge resection of both ovaries and, as noted, she had subsequent  diagnostic laparoscopy in 2002 with finding of pelvic adhesions,  subsequently in 2003 had a total abdominal hysterectomy.   FAMILY HISTORY:  Noncontributory.   SOCIAL HISTORY:  No tobacco or alcohol use.   REVIEW OF SYSTEMS:  Noncontributory.   PHYSICAL EXAMINATION:  VITAL SIGNS:  The patient is afebrile with stable  vital signs.  HEENT:  Patient normocephalic.  Pupils equal, round, and reactive to light  and accommodation.  Extraocular movements were intact.  Sclerae and  conjunctivae clear.  Oropharynx clear.  NECK:  Without thyromegaly.  BREASTS:  No discrete masses.  LUNGS:  Clear.  CARDIAC:  Regular rhythm and rate with no murmurs or gallops.  ABDOMEN:  Benign.  No mass, organomegaly or tenderness.  PELVIC:  Normal external genitalia.  Vaginal mucosa is clear.  Cuff intact.  Bimanual exam:  Left-sided fullness.  Right adnexa unremarkable.  EXTREMITIES:  Trace edema.  NEUROLOGIC:  Grossly within normal limits.   IMPRESSION:  Persistent cystic enlargement of the left ovary.   PLAN:  The patient will undergo laparoscopic evaluation.  Per her request,  the cyst will be removed and sent for pathologic review.  We have discussed  removing the ovary, declined by patient.  The risks of surgery have been  discussed, including the risk of infection; the risk of hemorrhage that  could require transfusion with the risk of AIDS or hepatitis; the risk of  injury to adjacent organs requiring exploratory surgery; the risk of deep  venous thrombosis and pulmonary embolus.  The patient expressed an  understanding of the indications and risks.      Juluis Mire, M.D.  Electronically Signed     JSM/MEDQ  D:  08/30/2005  T:  08/30/2005  Job:  [id number]

## 2011-03-29 NOTE — Op Note (Signed)
[facility]  Patient:    Becky Wolfe, Becky Wolfe                      MRN: [id number] Proc. Date: 03/06/01 Adm. Date:  [id number] Disc. Date: [id number] Attending:  Frederich Balding                           Operative Report  PREOPERATIVE DIAGNOSIS:       Pelvic pain.  POSTOPERATIVE DIAGNOSES:      1. Pelvic adhesions.                               2. Left corpus luteum cyst of the left ovary.  OPERATIVE PROCEDURES:         1. Diagnostic laparoscopy.                               2. Lysis of adhesions.                               3. Left ovarian cystectomy.  SURGEON:                      Juluis Mire, M.D.  ANESTHESIA:                   General endotracheal.  ESTIMATED BLOOD LOSS:         Minimal.  PACKS AND DRAINS:             None.  INTRAOPERATIVE BLOOD REPLACED:               None.  COMPLICATIONS:                None.  INDICATIONS:                  Dictated in the history and physical.  DESCRIPTION OF PROCEDURE:     The patient was taken to the OR and placed in the supine position.  After a satisfactory level of general endotracheal anesthesia was obtained, the patient was placed in the dorsal lithotomy position using the Allen stirrups.  The abdomen, perineum and vagina were prepped out with Betadine.  The bladder was empty with catheterization.  A Hulka tenaculum was then put in place.  The patient was draped out for laparoscopy.  A subumbilical incision was made with a knife.  A Veress needle was introduced into the abdominal cavity.  The abdomen was insufflated with approximately 2 L of carbon dioxide.  The operating laparoscope was then introduced.  Thin omental adhesions were noted on the periabdominal wall. There was no evidence of bowel involvement.  A 5 mm trocar was put in place in the suprapubic area under direct visualization and also went through the adhesed omentum.  At this point in time, we placed a 5 mm laparoscope  through the 5 mm trocar to visualize the upper abdomen.  Again, in the omental adhesions, there was no evidence of bowel.  Therefore, we continued with the procedure.  The uterus was enlarged and somewhat irregular.  The right tube and ovary were surgically absent.  The left tube and ovary were unremarkable except for a resolving apparent functional cyst.  We did make an incision  in the ovarian capsule.  There was what looked like a hemorrhagic corpus luteum. Pieces of the cyst wall were removed and sent for pathologic review. Hemostasis was excellent.  The pelvic cavity was thoroughly irrigated.  She had some adhesions from the cul-de-sac area to the right that were also taken down.  No active endometriosis was noted.  It was felt that she may have uterine ______ as the patients symptomatology.  At this point in time, we revisualized the abdominal and pelvic area.  There was no evidence of injury to adjacent organs.  Hemostasis was excellent.  The abdomen was desufflated of the carbon dioxide.  All trocars were removed.  The subumbilical incision was closed with interrupted subcuticular 4-0 Vicryl.  The suprapubic incision was closed with Steri-Strips.  The Hulka tenaculum was then removed.  The patient was recatheterized.  Urine output was clear and adequate.  The patient was taken out of the dorsal lithotomy position.  Once alert and extubated, she was transferred to the recovery room in good condition. DD:  03/06/01 TD:  03/06/01 Job: [id number]

## 2011-03-29 NOTE — H&P (Signed)
[facility]  Patient:    Becky Wolfe, Becky Wolfe                        MRN: [id number] Adm. Date:  03/06/01 Attending:  Juluis Mire, M.D.                         History and Physical                                Patient is a 50 year old gravida 2, para 2 married female who presents for diagnostic laparoscopy for evaluation of persistent left lower quadrant pain and cystic enlargement of the left ovary.                                In relation to the present admission, patient has had a long-standing history of intermittent left lower quadrant pain.  For this she has undergone evaluation with ultrasound which did reveal a cystic enlargement of the left ovary that subsequently did persist.  Due to this persistent cystic enlargement and continued pelvic pain, the patient now presents for laparoscopic evaluation to rule out such things as pelvic adhesions or endometriosis.  PAST MEDICAL HISTORY:         Usual childhood diseases without any significant sequelae.  In 1983 or 1984 she had bilateral ovarian wedge resections as well as an appendectomy.  Subsequently, because of persistent cystic enlargement of the right ovary she underwent exploratory laparotomy with right salpingo-oophorectomy and salpingectomy.  This was done in 1994.  Therefore, concern is obviously she may have pelvic adhesions.  ALLERGIES:                    No known drug allergies.  MEDICATIONS:                  None.  PAST SURGICAL HISTORY:        As noted previously she had the bilateral ovarian wedge resection as well as appendectomy.  Subsequently had exploratory laparotomy with right salpingo-oophorectomy.  She has also had two prior cesarean sections.  FAMILY HISTORY:               Noncontributory.  SOCIAL HISTORY:               No tobacco or alcohol use.  REVIEW OF SYSTEMS:            Noncontributory.  PHYSICAL EXAMINATION  VITAL SIGNS:                  Patient is  afebrile with stable vital signs.  HEENT:                        Normocephalic.  Pupils are equal, round and reactive to light and accommodation.  Extraocular movements are intact. Sclerae and conjunctivae:  Clear.  Oropharynx:  Clear.  NECK:                         Without thyromegaly.  BREASTS:                      No discreet masses.  LUNGS:  Clear.  CARDIOVASCULAR:               Regular rhythm and rate with no murmurs or gallops.  ABDOMEN:                      Benign.  PELVIC:                       Normal external genitalia.  Vaginal mucosa: Clear.  Cervix:  Unremarkable.  Uterus:  Upper limits of normal size.  Slight adnexal fullness and tenderness.  Right adnexa:  Unremarkable.  EXTREMITIES:                  Trace edema.  NEUROLOGIC:                   Grossly within normal limits.  BASIC IMPRESSION:             Chronic left lower quadrant pain with cystic enlargement of left adnexa.  Rule out endometriosis versus adhesive process.  PLAN:                         The patient will undergo laparoscopic evaluation with laser standby.  The risks of surgery have been discussed including the risks of anesthesia, the risk of incisional infection or bruising, the risk of injury to adjacent organs such as bladder or bowel that could require further exploratory surgery, the risk of vascular injury that could require transfusion with associated risk of AIDS or hepatitis.  Also discussed the risk of deep venous thrombosis and resultant pulmonary embolus.  Patient expressed understanding of indications and risks. DD:  03/06/01 TD:  03/06/01 Job: [id number]

## 2011-03-29 NOTE — Discharge Summary (Signed)
   NAME:  Becky Wolfe, Becky Wolfe                         ACCOUNT NO.:  [id number]   MEDICAL RECORD NO.:  [id number]                   PATIENT TYPE:  INP   LOCATION:  9323                                 FACILITY:  [facility]   PHYSICIAN:  Juluis Mire, M.D.                DATE OF BIRTH:  1961-02-06   DATE OF ADMISSION:  10/12/2002  DATE OF DISCHARGE:  10/15/2002                                 DISCHARGE SUMMARY   ADMISSION DIAGNOSIS:  Menorrhagia and pelvic pain thought to be secondary to  uterine adenomyosis.   DISCHARGE DIAGNOSES:  1. Menorrhagia and pelvic pain though to be secondary to uterine     adenomyosis.  2. Left ovarian cyst.   PROCEDURE:  Exploratory laparotomy with lysis of adhesions, left ovarian  cystectomy and total abdominal hysterectomy.   HISTORY OF PRESENT ILLNESS:  For complete History and Physical, please see  dictated note.   HOSPITAL COURSE:  The patient underwent above-noted surgery.  Postoperatively, that evening after surgery, she began having low O2  saturations.  She was seen by Dr. Arby Barrette, anesthesiologist in the  hospital.  Chest x-ray did reveal atelectasis.  She was started on pulmonary  toilet.  The following day, she seemed to be doing better.  Postop  hemoglobin was 9.  However, she continued to have low saturations.  Arterial  blood gas was subsequently obtained.  On the morning of postop day #2, pO2  of 53.4.  She was sent for spiral CT to rule out pulmonary embolus.  This  was negative except for atelectasis.  She was maintained on pulmonary toilet  with incentive spirometry that had improving O2 saturations.  She was  discharged home on postop day #3.  She was afebrile with stable vital signs.  Abdomen was soft and nontender.  Bowel sounds were active.  She was passing  flatus.  The incision was intact.  She was voiding without difficulty and  had no active vaginal bleeding.  O2 saturations were 95% on room air.   CONDITION ON DISCHARGE:  She is  discharged home in stable condition.   DISPOSITION:  Routine postop instructions are already given.   ACTIVITY:  She is to avoid heavy lifting, vaginal entrance or driving a car.   SPECIAL INSTRUCTIONS:  She is to watch for signs of infection, nausea,  vomiting, increasing abdominal pain or active vaginal bleeding.   DISCHARGE MEDICATIONS:  1. Tylox.  2. Iron sulfate.   FOLLOW UP:  Reassessment in the office in one week.                                               Juluis Mire, M.D.   JSM/MEDQ  D:  10/15/2002  T:  10/16/2002  Job:  [id number]

## 2011-03-29 NOTE — Op Note (Signed)
NAMEJOHNNA, BOLLIER               ACCOUNT NO.:  [id number]   MEDICAL RECORD NO.:  [id number]          PATIENT TYPE:  INP   LOCATION:  9311                          [facility]ACILITY:  [facility]   PHYSICIAN:  Juluis Mire, M.D.   DATE OF BIRTH:  02-Sep-1961   DATE OF PROCEDURE:  08/28/2006  DATE OF DISCHARGE:                                 OPERATIVE REPORT   PREOPERATIVE DIAGNOSIS:  Persistent cystic enlargement of the left ovary  with left ovarian entrapment.   POSTOPERATIVE DIAGNOSIS:  Persistent cystic enlargement of the left ovary  with left ovarian entrapment, cystotomy.   OPERATIVE PROCEDURE:  Open laparoscopy, lysis of adhesions, development of  the cystotomy, exploratory laparotomy with left salpingo-oophorectomy,  cystoscopy and replacement of cystotomy site.   SURGEON:  Juluis Mire, M.D.   ANESTHESIA:  General endotracheal anesthesia.   ESTIMATED BLOOD LOSS:  200 mL.   PACKS AND DRAINS:  None.   INTEROPERATIVE BLOOD REPLACEMENT:  None.   COMPLICATIONS:  Cystotomy.   For indications please see dictated history and physical.   PROCEDURE IN DETAIL:  The patient was taken to the OR and placed in the  supine position.  After a satisfactory level of general endotracheal  anesthesia was obtained, the patient was placed in the dorsal lithotomy  position using the Allen stirrups.  The abdomen was prepped out with  Betadine, the bladder was placed to straight drain with an indwelling Foley.  The patient was then draped in a sterile field.  A subumbilical incision was  made with the knife and extended through the subcutaneous tissue.  The  fascia was entered sharply and incision fashioned laterally.  The peritoneum  was entered with blunt pressure.  The taut open laparoscopic trocar was put  in place and secured, the laparoscope was introduced.  She had some omental  adhesions above the umbilicus, no bowel was noted.  At this point, a 5 mm  trocar was put in place in the suprapubic  area in the left lower quadrant.  Visualization revealed the ovary on the left to be entrapped and cystically  enlarged.  The uterus and right ovary were surgically absent.  We were able  to elevate the ovary.  We then made an incision in the peritoneum to the  lateral side of the ovary.  We then developed the retroperitoneal space.  We  then began the dissection towards the bladder.  We isolated the ovarian  vasculature above the ureter, but as we dissected down towards the bladder,  the bladder was densely adherent to actually the ovarian attachment to the  sidewall and a cystotomy incision was made.  The Foley bulb was relatively  small.  At this point in time, the decision was to proceed with exploratory  surgery so we could complete the removal of the ovary and repair the  cystotomy site.   The patient's legs were repositioned.  A low transverse skin incision was  made with the knife and carried through the subcutaneous tissue.  The fascia  was entered sharply and incision fashioned in a lateral fashion, taking off  the muscles superiorly and inferiorly.  The rectus muscles were separated  midline.  The peritoneum was entered sharply and the incision in the  peritoneum extended both superiorly inferiorly.  Omental adhesions to the  upper abdominal wall were taken down.  A O'Connor-O'Sullivan retractor was  put in place.  The left ovary was then elevated, the ureter was easily  identified in the retroperitoneal space, the ovarian vasculature was  isolated, clamped and cut and the ovary was passed off the operative field.  The vessels were then ligated with two free ties of 0 Vicryl.  We had good  hemostasis at that point.   We then identified the cystotomy site.  We gave the patient indigo carmine.  We then introduced a cystoscope through the cystotomy site which, of note,  was in the fundal area of the bladder.  We could easily identify the  trigone, it was not involved.  Both  ureters were seen to be spilling the  blue tinged urine.  At this point in time, the cystoscope was removed.  We  closed the bladder mucosa with a running suture of 3-0 chromic, the second  layer was brought over this with 2-0 Vicryl, and a third layer was brought  over using 2-0 Vicryl.  The urine output was clearing at this point in time.  We thoroughly irrigated the pelvis and had excellent hemostasis.  At this  point in time, the peritoneum was closed with a running suture of 3-0  Vicryl, the fascia was closed with a suture of 0 PDS, the skin was closed  with staples and Steri-Strips.  The subumbilical fascia was closed with two  figure-of-eights of 0 Vicryl, the skin with interrupted subcuticular 4-0  Vicryl.  The incision in the left lower quadrant was closed with Dermabond.  At this point in time, the patient was taken out of the dorsal lithotomy  position.  Once alert and extubated, transferred to the recovery room in  good condition.  Sponge, instrument, and needle count was correct by  circulating nurse x2.      Juluis Mire, M.D.  Electronically Signed     JSM/MEDQ  D:  08/28/2006  T:  08/29/2006  Job:  [id number]

## 2011-03-29 NOTE — Op Note (Signed)
Becky Wolfe, Becky Wolfe               ACCOUNT NO.:  [id number]   MEDICAL RECORD NO.:  [id number]          PATIENT TYPE:  AMB   LOCATION:  SDC                           [facility]ILITY:  [facility]   PHYSICIAN:  Juluis Mire, M.D.   DATE OF BIRTH:  [birth year]   DATE OF PROCEDURE:  08/30/2005  DATE OF DISCHARGE:                                 OPERATIVE REPORT   PREOPERATIVE DIAGNOSIS:  Cystic enlargement of the left ovary.   POSTOPERATIVE DIAGNOSES:  1.  Pelvic adhesions.  2.  Cystic enlargement of the left ovary, probably simple functional cyst.   OPERATIVE PROCEDURE:  1.  Open laparoscopy.  2.  Lysis of adhesions.  3.  Left ovarian cystectomy.   SURGEON:  Dr. Arelia Sneddon   ANESTHESIA:  General.   ESTIMATED BLOOD LOSS:  Minimal.   PACKS AND DRAINS:  None.   INTRAOPERATIVE BLOOD REPLACEMENT:  None.   COMPLICATIONS:  None.   INDICATIONS:  Dictated in history and physical.   DESCRIPTION OF PROCEDURE:  The patient was taken to the OR and placed in a  supine position.  After a satisfactory level of general endotracheal  anesthesia was obtained, the patient was placed in the dorsal lithotomy  position using the Allen stirrups.  The abdomen, perineum, and vagina were  prepped out with Betadine.  A sponge-stick was placed in the vaginal vault.  The bladder was __________ catheterization.  The patient was then draped in  a sterile field. A subumbilical incision was made with the knife and  extended through subcutaneous tissue.  The fascia was then sharply extended  __________.  We entered through the peritoneum using blunt pressure with the  finger.  The taut open laparoscopic trocar was put in placed and secured.  Laparoscope was then introduced.  There was no evidence of injury to  adjacent organs.  She did have some omental adhesions around the umbilical  area.  These were taken down using the Gyrus bipolar.  Next, a 5 mm trocar  was put in place in the suprapubic area under direct  visualization.  The  uterus, right tube, and ovary were surgically absent.  The left ovary was  adherent to the pelvic sidewall.  It had the tube draping over it.  There  was a cyst located on the superior surface.  We first used the Gyrus to free  up the ovary from adhesions.  We then identified the cyst and entered it  sharply and dissected the cyst wall free.  It was a simple-appearing cyst  and sent for pathological review.  There was a smaller cyst on the inferior  aspect that was also drained.  At the end of the procedure, both cysts were  adequately removed.  We had no active bleeding or signs of injury to  adjacent organs.  The abdomen was deflated with carbon dioxide.  All trocars  were removed.  The subumbilical fascia was closed with 2 figure-of-eights of  0 Vicryl, skin with interrupted subcuticulars of 4-0 Vicryl.  Suprapubic  incision was closed with Dermabond.  Sponge on sponge-stick was removed from  the vaginal vault, the patient taken out of the dorsolithotomy position.  Once alert and extubated, transferred to the recovery room in good  condition.      Juluis Mire, M.D.  Electronically Signed     JSM/MEDQ  D:  08/30/2005  T:  08/31/2005  Job:  [id number]

## 2011-06-25 ENCOUNTER — Emergency Department (HOSPITAL_COMMUNITY)
Admission: EM | Admit: 2011-06-25 | Discharge: 2011-06-25 | Disposition: A | Discharge status: Home / Self Care | Payer: 59 | Attending: Emergency Medicine | Admitting: Emergency Medicine

## 2011-06-25 ENCOUNTER — Emergency Department (HOSPITAL_COMMUNITY): Payer: 59

## 2011-06-25 DIAGNOSIS — R339 Retention of urine, unspecified: Secondary | ICD-10-CM | POA: Insufficient documentation

## 2011-06-25 DIAGNOSIS — R141 Gas pain: Secondary | ICD-10-CM | POA: Insufficient documentation

## 2011-06-25 DIAGNOSIS — R142 Eructation: Secondary | ICD-10-CM | POA: Insufficient documentation

## 2011-06-25 DIAGNOSIS — F411 Generalized anxiety disorder: Secondary | ICD-10-CM | POA: Insufficient documentation

## 2011-06-25 DIAGNOSIS — K921 Melena: Secondary | ICD-10-CM | POA: Insufficient documentation

## 2011-06-25 DIAGNOSIS — R300 Dysuria: Secondary | ICD-10-CM | POA: Insufficient documentation

## 2011-06-25 DIAGNOSIS — R3911 Hesitancy of micturition: Secondary | ICD-10-CM | POA: Insufficient documentation

## 2011-06-25 DIAGNOSIS — R10814 Left lower quadrant abdominal tenderness: Secondary | ICD-10-CM | POA: Insufficient documentation

## 2011-06-25 DIAGNOSIS — R109 Unspecified abdominal pain: Secondary | ICD-10-CM | POA: Insufficient documentation

## 2011-06-25 DIAGNOSIS — R29898 Other symptoms and signs involving the musculoskeletal system: Secondary | ICD-10-CM | POA: Insufficient documentation

## 2011-06-25 DIAGNOSIS — R110 Nausea: Secondary | ICD-10-CM | POA: Insufficient documentation

## 2011-06-25 DIAGNOSIS — Z8619 Personal history of other infectious and parasitic diseases: Secondary | ICD-10-CM | POA: Insufficient documentation

## 2011-06-25 DIAGNOSIS — Z79899 Other long term (current) drug therapy: Secondary | ICD-10-CM | POA: Insufficient documentation

## 2011-06-25 LAB — DIFFERENTIAL
Basophils Absolute: 0 10*3/uL (ref 0.0–0.1)
Eosinophils Relative: 2 % (ref 0–5)
Lymphocytes Relative: 51 % — ABNORMAL HIGH (ref 12–46)
Monocytes Absolute: 0.3 10*3/uL (ref 0.1–1.0)
Monocytes Relative: 7 % (ref 3–12)

## 2011-06-25 LAB — CBC
HCT: 41.7 % (ref 36.0–46.0)
MCH: 31.2 pg (ref 26.0–34.0)
MCHC: 36 g/dL (ref 30.0–36.0)
MCV: 86.7 fL (ref 78.0–100.0)
RDW: 12.4 % (ref 11.5–15.5)

## 2011-06-25 LAB — BASIC METABOLIC PANEL
BUN: 6 mg/dL (ref 6–23)
Calcium: 9.2 mg/dL (ref 8.4–10.5)
Creatinine, Ser: 0.66 mg/dL (ref 0.50–1.10)
GFR calc Af Amer: >60 mL/min (ref >60)
GFR calc non Af Amer: >60 mL/min (ref >60)
Glucose, Bld: 78 mg/dL (ref 70–99)
Potassium: 4.2 mEq/L (ref 3.5–5.1)

## 2011-06-25 LAB — URINALYSIS, ROUTINE W REFLEX MICROSCOPIC
Bilirubin Urine: NEGATIVE
Ketones, ur: NEGATIVE mg/dL
Nitrite: NEGATIVE
Specific Gravity, Urine: 1.019 (ref 1.005–1.030)
Urobilinogen, UA: 1 mg/dL (ref 0.0–1.0)

## 2011-06-25 MED ORDER — IOHEXOL 300 MG/ML  SOLN
80.0000 mL | Freq: Once | INTRAMUSCULAR | Status: AC | PRN
  Administered 2011-06-25: 80 mL via INTRAVENOUS

## 2011-08-08 ENCOUNTER — Other Ambulatory Visit: Payer: Self-pay | Admitting: Gastroenterology

## 2011-08-08 ENCOUNTER — Ambulatory Visit (INDEPENDENT_AMBULATORY_CARE_PROVIDER_SITE_OTHER): Payer: Medicare Other | Admitting: Gastroenterology

## 2011-08-08 DIAGNOSIS — D649 Anemia, unspecified: Secondary | ICD-10-CM

## 2011-08-08 DIAGNOSIS — B182 Chronic viral hepatitis C: Secondary | ICD-10-CM

## 2011-08-12 ENCOUNTER — Other Ambulatory Visit: Payer: Self-pay | Admitting: Gastroenterology

## 2011-08-12 LAB — CBC WITH DIFFERENTIAL/PLATELET
Basophils Relative: 1 % (ref 0–1)
Eosinophils Absolute: 0 10*3/uL (ref 0.0–0.7)
Eosinophils Relative: 1 % (ref 0–5)
MCH: 30.9 pg (ref 26.0–34.0)
MCHC: 34.4 g/dL (ref 30.0–36.0)
MCV: 89.8 fL (ref 78.0–100.0)
Neutrophils Relative %: 42 % — ABNORMAL LOW (ref 43–77)
Platelets: 250 10*3/uL (ref 150–400)
RDW: 12.8 % (ref 11.5–15.5)

## 2011-08-12 LAB — PROTIME-INR
INR: 1.02 (ref <1.50)
Prothrombin Time: 13.8 seconds (ref 11.6–15.2)

## 2011-08-13 LAB — COMPLETE METABOLIC PANEL WITH GFR
ALT: 45 U/L — ABNORMAL HIGH (ref 0–35)
Alkaline Phosphatase: 155 U/L — ABNORMAL HIGH (ref 39–117)
Creat: 0.71 mg/dL (ref 0.50–1.10)
GFR, Est African American: >60 mL/min (ref >60)
GFR, Est Non African American: >60 mL/min (ref >60)
Sodium: 142 mEq/L (ref 135–145)
Total Bilirubin: 0.7 mg/dL (ref 0.3–1.2)
Total Protein: 7.6 g/dL (ref 6.0–8.3)

## 2011-08-13 LAB — ANA: Anti Nuclear Antibody(ANA): NEGATIVE

## 2011-08-13 LAB — HEPATITIS A ANTIBODY, TOTAL: Hep A Total Ab: POSITIVE — AB

## 2011-08-13 LAB — HEPATITIS B SURFACE ANTIBODY,QUALITATIVE: Hep B S Ab: POSITIVE — AB

## 2011-08-14 LAB — INTERLEUKIN 28B POLYMORPHISM GENOTYPE, RT-PCR

## 2011-08-15 NOTE — Progress Notes (Signed)
Becky Wolfe, Becky Wolfe  MR#:  [id number]      DATE:  08/08/2011  DOB:  November 24, 1960    cc: Consulting Physician:  Levert Feinstein, MD, [facility], [address], [place], [place] [address], [place] [phone] Harlen Labs, MD, [facility], [facility], [place], [place] [address], Fax [phone] Primary Care Physician:  Wallis Mart, MD, [facility] [place], [address] [place], [address], [place], [place] [address], Fax [phone] Referring Physician:  Willis Modena, MD, [facility], [address], [place], [place] [address], Fax [phone] Richard A. Epimenio Foot, MD, [facility], [address], [address], [place], [place] [address], Phone [phone], Fax [phone]    Reason for referral:  Genotype 1a hepatitis C, null responder to previous treatment at [facility].   History:  The patient is a 50 year old woman who I have been asked to see in consultation by Dr. Dulce Sellar regarding genotype 1a hepatitis C, previously null responder to therapy.  The patient was previously seen by our practice regarding her genotype 1a hepatitis C starting on 10/30/2004. She was evaluated for her genotype 1a hepatitis C with a liver biopsy on 11/23/2004,  showing grade 2, stage zero disease. She was thereafter started on treatment with combination of pegylated interferon and ribavirin on 11/30/2004. She was a null responder with a viral load starting at  594,000 international units per mL dropping to 218,000 international units per mL at treatment week 12, at which point treatment was discontinued. She did not require dose reduction in therapy for any  significant cytopenias. She did develop depression for which she was on Celexa.  She was supposed to be seen in followup but did not return and Dr. Dulce Sellar has now referred her back for reconsideration of treatment for hepatitis C.  There are  currently no symptoms referable to her history of hepatitis C nor are there symptoms to suggest cryoglobulin mediated or decompensated liver disease.  With respect to risk factors for liver disease, she denied any history of significant alcohol use over the course of her lifetime, and has not had any alcohol since her diagnosis in 2005. There is no history  of intravenous or intranasal drug use. There is no history of tattoos, or unsterile body piercing. She had a blood transfusion in 1974. There is no family history of liver disease, and she believes she has been  vaccinated against hepatitis A and B, but in fact looking at records at [facility] she received only 1 hepatitis A vaccine and 2 of the 3 hepatitis B vaccines.   Past medical history:  Significant for reflex sympathetic dystrophy that has developed since last being seen. In addition, she reports a history of multiple sclerosis. Initially she reports seeing a Dr. Terrace Arabia in [place]. She  was then referred on to Dr. Renne Crigler at [facility] who was unsure as to whether she had MS or possibly sarcoidosis based on the possible low ACE level. I do not have any records regarding this. She reports that  Dr. Renne Crigler had then sent her to Dr. Epimenio Foot in [facility] for further evaluation. Dr. Epimenio Foot wanted to discontinue the Rebif (interferon) that she was on for treatment of MS and reassess things with another  scan of her brain by November 2012. The appointment has already been scheduled. Otherwise her past medical history is significant for  migraine headaches. There is no history of diabetes, dysthyroidism, or coronary disease.   PAST SURGICAL  HISTORY:  Neck surgery, tarsal tunnel release.   Past psychiatric history:  Denies.   CURRENT MEDICATIONS:  Nucynta 200 mg p.o. t.i.d., clonazepam 1 mg p.o. b.i.d., gabapentin 100 mg p.o. 6 times a day, Exalgo 8 mg p.o. at bedtime, clonidine 0.1 mg p.o. q. 8 hours, citalopram 20 mg p.o. daily, Vivelle-Dot.    ALLERGIES:    Codeine causes swelling.    Habits:  Smoking, few cigarettes per day. Alcohol, as above.   FAMILY HISTORY:  As above.   SOCIAL HISTORY:  Married with 2 children. She is currently disabled, previously worked as a Aeronautical engineer.   REVIEW OF SYSTEMS:  All 10 systems reviewed today on the review of systems form, which was signed and placed in the chart. The patient reports that she is able to perform all of her ADLs and ambulate to a limited degree around  stores with a cane but denies any shortness of breath or chest pain with exercise. CES-D was 20.   PHYSICAL EXAMINATION:   CONSTITUTIONAL:  Appears stated age. There is no significant peripheral wasting. VITAL SIGNS: Height 62 inches, weight 179 pounds, blood pressure 146/74, pulse 69, temperature 97.9 Fahrenheit. EARS,  NOSE, MOUTH, AND THROAT: Unremarkable oropharynx. No thyromegaly or neck masses.  CHEST:  Resonant to percussion. Clear to auscultation.  CARDIOVASCULAR:  Heart sounds normal, S1, S2 without murmurs, rubs. There is no peripheral edema.  ABDOMINAL EXAMINATION:  Normal bowel sounds. No masses or tenderness. I could not appreciate liver edge or spleen tip. LYMPHATICS: No  cervical or inguinal lymphadenopathy. CENTRAL NERVOUS SYSTEM: No asterixis or focal neurologic findings.  DERMATOLOGIC:  Anicteric. No palmar erythema.  EYES:  Anicteric sclera. Pupils equal and reactive to light.   Laboratories:  From 03/19/2011; TSH 1.18. Genotype was 1a, viral load 608,750 international units per mL.   On 11/26/2010; her creatinine was 0.59, her AST was 58, ALT 52, ALP 131, albumin 3.9, total bilirubin 0.7.   ASSESSMENT:  The patient is a 50 year old woman with a history genotype 1a hepatitis C with a previous biopsy on 11/23/2004, showing grade 2 stage zero disease. She has not been re-biopsied. Her IL 28 b status  is unknown. She was a null responder to 12 weeks of pegylated interferon and ribavirin without dose  reductions. I think she is a good candidate for another course of therapy, but I would like to  determine the extent of her disease with another biopsy. In addition, I will check her IL 28 b status. If she did indeed have multiple scleroses, this would not be a contraindication to the use of  interferon as interferon is used to treat this. However, there seems to be some mention of sarcoidosis according to the patient and I would like to make sure that was not the case because this could flare on  the influence of interferon. I will need records from her neurologist.  In my discussion today with the patient, we discussed her previous treatment and its implications in terms of future treatment. I have explained to her that as a null responder she would need a course of  pegylated interferon, ribavirin and a protease inhibitor probably for up to 12 months. We discussed response rates. I discussed the specific system, constitutional, psychiatric side effects of therapy,  particularly that of the protease inhibitor. I have explained to her, I would like to have her old records regarding her neurologic condition particularly that of the concern about sarcoidosis. I have  explained that sarcoidosis  could flare under the influence of interferon. We agreed that she would undergo a liver biopsy. She agreed that she would contact Dr. Bonnita Hollow office to help get records  from all her other neurologists sent to me, that the records that he may retain sent to me, and then she could come back to discuss results of all the testing.  I also discussed the possibility of her participating in clinical trial with Brooke Dare, our clinical trials coordinator who will contact the patient if appropriate.   plan:  1. Will arrange for liver biopsy. 2. Will test for hepatitis A and B immunity, as she did not complete her previous series. 3. Will test an IL 28 b in addition to standard labs. 4. To return in approximately  4-6 months' time to review the results of lab testing and biopsy results. 5. I have faxed off a request for information from all of her neurologists.            Brooke Dare, MD   ADDENDUM:  Fredric Mare B TT   Hep A and B immune  I reviewed a CT of the chest from 01/08/11 done at [facility] that specifically said that there was no evidence of sarcoidosis.  However, Dr Renne Crigler from [facility] was concerned about evidence of sarcoidosis on her CSF analysis and mentions an elevated ACE.a,d wondered if her cervical disc disease was from sarcoid too.  She doubted the diagnosis of MS.  I note that she was given Rebif and if sarcoid is exacerbated by interferons, I would have expected a flare of some organ involvement.  [id number]  D:  Thu Sep 27 18:31:43 2012 ; T:  Thu Sep 27 20:02:27 2012  Job #:  [id number]

## 2011-08-30 ENCOUNTER — Other Ambulatory Visit: Payer: Self-pay | Admitting: Gastroenterology

## 2011-08-30 DIAGNOSIS — B182 Chronic viral hepatitis C: Secondary | ICD-10-CM

## 2011-09-10 ENCOUNTER — Other Ambulatory Visit: Payer: Self-pay | Admitting: Interventional Radiology

## 2011-09-10 ENCOUNTER — Ambulatory Visit (HOSPITAL_COMMUNITY)
Admission: RE | Admit: 2011-09-10 | Discharge: 2011-09-10 | Disposition: A | Discharge status: Home / Self Care | Payer: 59 | Source: Ambulatory Visit | Attending: Gastroenterology | Admitting: Gastroenterology

## 2011-09-10 DIAGNOSIS — B182 Chronic viral hepatitis C: Secondary | ICD-10-CM | POA: Insufficient documentation

## 2011-09-10 LAB — CBC
HCT: 39.7 % (ref 36.0–46.0)
Hemoglobin: 14.6 g/dL (ref 12.0–15.0)
MCV: 87.1 fL (ref 78.0–100.0)
RDW: 12.1 % (ref 11.5–15.5)
WBC: 4 10*3/uL (ref 4.0–10.5)

## 2011-09-10 LAB — APTT: aPTT: 35 seconds (ref 24–37)

## 2011-09-12 ENCOUNTER — Encounter: Payer: Self-pay | Admitting: Gastroenterology

## 2011-09-25 ENCOUNTER — Other Ambulatory Visit: Payer: Self-pay | Admitting: Gastroenterology

## 2011-09-25 DIAGNOSIS — R1011 Right upper quadrant pain: Secondary | ICD-10-CM

## 2011-09-26 ENCOUNTER — Ambulatory Visit
Admission: RE | Admit: 2011-09-26 | Discharge: 2011-09-26 | Disposition: A | Discharge status: Home / Self Care | Payer: Medicare Other | Source: Ambulatory Visit | Attending: Gastroenterology | Admitting: Gastroenterology

## 2011-09-26 DIAGNOSIS — R1011 Right upper quadrant pain: Secondary | ICD-10-CM

## 2012-01-18 ENCOUNTER — Emergency Department (HOSPITAL_COMMUNITY)
Admission: EM | Admit: 2012-01-18 | Discharge: 2012-01-19 | Disposition: A | Discharge status: Home / Self Care | Payer: 59 | Attending: Emergency Medicine | Admitting: Emergency Medicine

## 2012-01-18 ENCOUNTER — Encounter (HOSPITAL_COMMUNITY): Payer: Self-pay | Admitting: Emergency Medicine

## 2012-01-18 DIAGNOSIS — Z0389 Encounter for observation for other suspected diseases and conditions ruled out: Secondary | ICD-10-CM | POA: Insufficient documentation

## 2012-01-18 HISTORY — DX: Multiple sclerosis, unspecified: G35.D

## 2012-01-18 HISTORY — DX: Multiple sclerosis: G35

## 2012-01-18 HISTORY — DX: Unspecified viral hepatitis C without hepatic coma: B19.20

## 2012-01-18 HISTORY — DX: Complex regional pain syndrome I, unspecified: G90.50

## 2012-01-18 NOTE — ED Notes (Signed)
PT. REPORTS RIGHT ANKLE PAIN , STATES SLIPPED AND FELL THIS EVENING AT [facility]. AMBULATORY.

## 2012-01-18 NOTE — ED Notes (Signed)
Pt. Slipped and fell at bath tub this evening , reports headache,neck pain , upper back pain , brief loc , ambulatory.

## 2012-01-19 NOTE — ED Notes (Signed)
No answer when called 

## 2012-06-18 ENCOUNTER — Ambulatory Visit (INDEPENDENT_AMBULATORY_CARE_PROVIDER_SITE_OTHER): Payer: 59 | Admitting: Gastroenterology

## 2012-06-18 DIAGNOSIS — B182 Chronic viral hepatitis C: Secondary | ICD-10-CM

## 2012-06-18 NOTE — Patient Instructions (Addendum)
1. To reach me at [facility] my address is: [facility] 4th Floor [facility] [address] [place], [place]  [address] Phone [phone] Fax [phone] Identify yourself as a [place] patient 2.  [email or web address] for information about PegIFN/ribavirin/simeprevir  Or sofosbuvir/ledipasivr as future treatments 3. I would see you in the new year

## 2012-06-25 NOTE — Progress Notes (Signed)
NAMELATORI, BEGGS    MR#:  [id number]      DATE:  06/18/2012  DOB:  Jun 18, 1961    cc:     REFERRING PHYSICIAN:   Willis Modena, MD, [facility], [address], [place], [place] [address], fax [phone].  primary care physician:  Lupita Raider, MD, [facility], [address], [address], [place], [place]  [address], fax [phone].  consulting physician:  Levert Feinstein, MD, [facility], [address], [place], [place]  [address], fax [phone];   Harlen Labs, MD, [facility], [facility], [place], [place]  [address], fax [phone];   Pearletha Furl. Epimenio Foot, MD, [facility], [address], [address], [place], [place]  [address], fax [phone].  reason for visit:  Followup genotype 1A hepatitis C, null responder to previous treatment at [facility].   HISTORY: The patient returned today accompanied by her husband. It will be recalled that since I last saw on 08/08/2011 to reassess her hepatitis she underwent an ultrasound and got a biopsy on 09/10/2011. This showed a modified HAI score of 6/18 for inflammation and a fibrosis score of 2/6, roughly translating to a low-grade VATS or Metavir score of 1 for fibrosis.   ASSESSMENT: This represents a slight change, although possibly not significant since on 11/23/2004 at which time she had grade 2 stage zero disease on her biopsy. She was contacted after seeing Dr. Clelia Croft on 06/10/2012.  Liver tests were obtained. She recalls being called by the nurse for Dr. Clelia Croft and being told her liver tests were abnormal and that protein was elevated, which could indicate liver dysfunction. This frightened her, leading her to believe that she had significant liver dysfunction. There are no symptoms to suggest decompensated liver disease nor other symptoms to suggest cryoglobulin mediated disease.   PAST MEDICAL HISTORY:  Significant for reflex sympathetic dystrophy. It will be recalled that she has been seen by a number of neurologists in the past for the possibility of multiple sclerosis. It should also be recalled when she saw Dr. Renne Crigler at [facility] she was unsure as to whether she had MS, but Dr. Renne Crigler also raised the possibility of having sarcoid based on a low ACE level. At that time, the patient was already taking Rebif which is an interferon. The plan was to ultimately stop that and to reassess her. Now the patient reports she is being referred to a neurologist at [facility], the name of which she cannot recall, but the appointment is coming up in the next few weeks.   CURRENT MEDICATIONS:  1. Neurontin 800 mg 3-4 times a day.  2. Citalopram 20 mg daily.  3. Exalgo 8 mg at bedtime.  4. Vivelle dot patch biweekly. 5. Clonidine 0.1 to 0.2 mg b.i.d. p.r.n.  6. Nucynta 200 mg every 4 hours p.r.n.  7. Zofran 4 mg every 6 hours p.r.n.  8. Clonazepam 0.5 mg to 1 mg b.i.d. p.r.n.   ALLERGIES: CODEINE causes swelling.  habits:  Still smoking a few cigarettes per day. Alcohol denies interval consumption.   REVIEW OF SYSTEMS: All 10 systems reviewed today with the patient and they are negative other than which is mentioned above. Her CES-D was 21.   PHYSICAL EXAMINATION:  Constitutional: Stated age. Vital signs: Height 62 inches, weight 179 pounds, unchanged from previously, blood pressure 125/81, pulse 77, temperature 98.0 Fahrenheit.   LABORATORIES: Laboratory work received from the clinic note of 06/10/2012: Total protein  8.5, albumin 4.3, for globulins of 4.2, total bilirubin 1.1, AST 48, ALT 48, ALP 142. Her creatinine was 0.74. Electrolytes were normal.   assessment:  The patient is a 51 year old woman with a history of genotype 1A hepatitis C with IL-28B TT with liver biopsy on 11/23/2004, showing grade 2 stage 0 disease. She was a null responder to 12 weeks of pegylated interferon and ribavirin without dose  reduction. I rebiopsied her on 09/10/2011 which showed an MHAI of 6/18 for inflammation and a score of 2/6 for fibrosis representing a minimal change in her disease activity since 2006.   There is a previous mention of sarcoidosis based on an ACE level but I should point out that she was able tolerate 12 weeks of interferon therapy without exacerbation of any sarcoidosis. Furthermore, it is not clear that she does have sarcoidosis. She has not had any end organ involvement with sarcoid and this is more based on some lab test abnormalities. It would seem to me that there is insufficient evidence of sarcoidosis to say that she could not be treated for hepatitis C with interferon based therapies. Certainly in terms of MS, other formulations of interferon are used to treat MS and I would expect that that would not exacerbate her multiple sclerosis, if indeed she has MS.  Hopefully there will be further information forthcoming when she sees the neurologist at [facility].   As a null responder it would be best to wait for the availability of interferon, ribavirin and simeprevir which would require up to 48 weeks of interferon-based therapy. Alternatively the combination of Sofosbuvir with ledipasvir may be helpful, but that is not expected out until 2015. This would have the advantage, however, of being all oral therapy without interferon.  In my discussion today with the patient and her husband, who came with her today, we discussed her previous evaluations. I discussed Epivir and Sofosbuvir and the outcomes to date as they are currently available. We discussed waiting for these drugs and the patient is in agreement. I also reassured her that the labs that she had drawn by Dr. Clelia Croft are not significantly different than previous and that the elevated protein is likely circulating antibodies related to her neurologic condition as much as it is related to her liver disease.   PLAN:  1. Hepatitis A and B immune.  2. I  await the neurology opinion from [facility]. I have given her our address at [facility] for this.  3. I have encouraged her to review some relative websites regarding the newer combinations of therapy as they are expected.  4. I expect to see her in the new year to discuss treatment with pegylated interferon, ribavirin and simeprevir or perhaps deferring until the availability of Sofosbuvir in combination with ledipasvir if the data looks favorable and a diagnosis of sarcoidosis has been confirmed at [facility].               Brooke Dare, MD   [phone]  D:  Thu Aug 08 19:17:32 2013 ; T:  Caleen Essex Aug 09 09:04:25 2013  Job #:  [id number]

## 2012-07-07 ENCOUNTER — Emergency Department (HOSPITAL_COMMUNITY)
Admission: EM | Admit: 2012-07-07 | Discharge: 2012-07-08 | Disposition: A | Discharge status: Home / Self Care | Payer: 59 | Attending: Emergency Medicine | Admitting: Emergency Medicine

## 2012-07-07 ENCOUNTER — Emergency Department (HOSPITAL_COMMUNITY): Payer: 59

## 2012-07-07 ENCOUNTER — Encounter (HOSPITAL_COMMUNITY): Payer: Self-pay | Admitting: Emergency Medicine

## 2012-07-07 DIAGNOSIS — S060X9A Concussion with loss of consciousness of unspecified duration, initial encounter: Secondary | ICD-10-CM

## 2012-07-07 DIAGNOSIS — G35 Multiple sclerosis: Secondary | ICD-10-CM | POA: Insufficient documentation

## 2012-07-07 DIAGNOSIS — M5030 Other cervical disc degeneration, unspecified cervical region: Secondary | ICD-10-CM | POA: Insufficient documentation

## 2012-07-07 DIAGNOSIS — W1809XA Striking against other object with subsequent fall, initial encounter: Secondary | ICD-10-CM | POA: Insufficient documentation

## 2012-07-07 DIAGNOSIS — S060XAA Concussion with loss of consciousness status unknown, initial encounter: Secondary | ICD-10-CM

## 2012-07-07 DIAGNOSIS — Z8619 Personal history of other infectious and parasitic diseases: Secondary | ICD-10-CM | POA: Insufficient documentation

## 2012-07-07 DIAGNOSIS — M542 Cervicalgia: Secondary | ICD-10-CM | POA: Insufficient documentation

## 2012-07-07 DIAGNOSIS — S0990XA Unspecified injury of head, initial encounter: Secondary | ICD-10-CM

## 2012-07-07 LAB — COMPREHENSIVE METABOLIC PANEL
ALT: 40 U/L — ABNORMAL HIGH (ref 0–35)
AST: 47 U/L — ABNORMAL HIGH (ref 0–37)
Alkaline Phosphatase: 145 U/L — ABNORMAL HIGH (ref 39–117)
CO2: 22 mEq/L (ref 19–32)
Calcium: 8.9 mg/dL (ref 8.4–10.5)
Chloride: 104 mEq/L (ref 96–112)
GFR calc Af Amer: >90 mL/min (ref >90)
GFR calc non Af Amer: >90 mL/min (ref >90)
Glucose, Bld: 84 mg/dL (ref 70–99)
Sodium: 136 mEq/L (ref 135–145)
Total Bilirubin: 0.6 mg/dL (ref 0.3–1.2)

## 2012-07-07 LAB — RAPID URINE DRUG SCREEN, HOSP PERFORMED
Amphetamines: NOT DETECTED
Barbiturates: NOT DETECTED

## 2012-07-07 LAB — CBC WITH DIFFERENTIAL/PLATELET
Eosinophils Relative: 2 % (ref 0–5)
HCT: 42.7 % (ref 36.0–46.0)
Lymphocytes Relative: 55 % — ABNORMAL HIGH (ref 12–46)
Lymphs Abs: 2.8 10*3/uL (ref 0.7–4.0)
MCV: 87 fL (ref 78.0–100.0)
Neutro Abs: 1.8 10*3/uL (ref 1.7–7.7)
Platelets: 194 10*3/uL (ref 150–400)
RBC: 4.91 MIL/uL (ref 3.87–5.11)
WBC: 5.1 10*3/uL (ref 4.0–10.5)

## 2012-07-07 LAB — ETHANOL: Alcohol, Ethyl (B): <11 mg/dL (ref 0–11)

## 2012-07-07 MED ORDER — AMMONIA AROMATIC IN INHA
RESPIRATORY_TRACT | Status: AC
  Administered 2012-07-07: 23:00:00
  Filled 2012-07-07: qty 20

## 2012-07-07 MED ORDER — SODIUM CHLORIDE 0.9 % IV SOLN
INTRAVENOUS | Status: DC
  Administered 2012-07-07: 23:00:00 via INTRAVENOUS

## 2012-07-07 NOTE — ED Notes (Signed)
Per EMS pt. Was witnessed falling from standing position at home. Pt. Hit head on chair with 1 min of LOC. Pupils sluggish, hand grips equal, no facial droop.  Pt. Has indention on right forehead.  Inital BP: 118/84 P 78 CHG 77

## 2012-07-08 MED ORDER — MORPHINE SULFATE 4 MG/ML IJ SOLN
6.0000 mg | Freq: Once | INTRAMUSCULAR | Status: AC
  Administered 2012-07-08: 6 mg via INTRAVENOUS
  Filled 2012-07-08: qty 2

## 2012-07-08 NOTE — ED Provider Notes (Signed)
History     CSN: [id number]  Arrival date & time 07/07/12  2213   First MD Initiated Contact with Patient 07/07/12 2306      Chief Complaint  Patient presents with  . Fall     Patient is a 51 y.o. female presenting with fall. The history is provided by the patient and the spouse.  Fall   For that the patient became lightheaded and fell from a standing position at home hitting her head on the chair.  She reports she lost consciousness for approximately 1 minute.  She presented to the ER with some alteration in her mental status.  She has a history of multiple sclerosis and RSD.  She also has a history of hepatitis.  She takes Dilaudid in the evening for her RSD.  Family reports her mental status is improving at this time.  No recent fevers or vomiting.  No history of seizures.  Denies weakness in upper lower extremities.  Mild neck pain.   Past Medical History  Diagnosis Date  . Multiple sclerosis   . RSD (reflex sympathetic dystrophy)   . Hepatitis C     Past Surgical History  Procedure Date  . Neck surgery   . Cesarean section   . Abdominal hysterectomy     History reviewed. No pertinent family history.  History  Substance Use Topics  . Smoking status: Current Everyday Smoker  . Smokeless tobacco: Not on file  . Alcohol Use: No    OB History    Grav Para Term Preterm Abortions TAB SAB Ect Mult Living                  Review of Systems  All other systems reviewed and are negative.    Allergies  Lodine  Home Medications   Current Outpatient Rx  Name Route Sig Dispense Refill  . AMITRIPTYLINE HCL 10 MG PO TABS Oral Take 10-20 mg by mouth at bedtime.    Marland Kitchen CITALOPRAM HYDROBROMIDE 20 MG PO TABS Oral Take 20 mg by mouth daily.    Marland Kitchen CLONAZEPAM 0.5 MG PO TABS Oral Take 0.5-1 mg by mouth 2 (two) times daily as needed. For anxiety.    Marland Kitchen CLONIDINE HCL 0.1 MG PO TABS Oral Take 0.1 mg by mouth every 8 (eight) hours as needed. For anxiety.    . CYCLOBENZAPRINE HCL  10 MG PO TABS Oral Take 10-20 mg by mouth at bedtime.     Marland Kitchen ESTRADIOL 0.1 MG/24HR TD PTTW Transdermal Place 1 patch onto the skin 2 (two) times a week.    Marland Kitchen GABAPENTIN 400 MG PO CAPS Oral Take 800 mg by mouth 3 (three) times daily.    Marland Kitchen HYDROMORPHONE HCL ER 8 MG PO TB24 Oral Take 8 mg by mouth every evening.     Marland Kitchen TAPENTADOL HCL 50 MG PO TABS Oral Take 200 mg by mouth 3 (three) times daily as needed. Pain      BP 122/57  Pulse 72  Temp 97.6 F (36.4 C) (Oral)  Resp 13  SpO2 100%  LMP 10/16/2011  Physical Exam  Nursing note and vitals reviewed. Constitutional: She is oriented to person, place, and time. She appears well-developed and well-nourished. No distress.  HENT:  Head: Normocephalic and atraumatic.  Eyes: EOM are normal.  Neck: Neck supple.       Immobilized in cervical collar.  Mild cervical and paracervical tenderness.  No cervical step-offs.  Cardiovascular: Normal rate, regular rhythm and normal heart sounds.  Pulmonary/Chest: Effort normal and breath sounds normal.  Abdominal: Soft. She exhibits no distension. There is no tenderness.  Musculoskeletal: Normal range of motion.       No thoracic or lumbar tenderness  Neurological: She is alert and oriented to person, place, and time.  Skin: Skin is warm and dry.  Psychiatric: She has a normal mood and affect. Judgment normal.    ED Course  Procedures (including critical care time)  Labs Reviewed  CBC WITH DIFFERENTIAL - Abnormal; Notable for the following:    Hemoglobin 15.5 (*)     MCHC 36.3 (*)     Neutrophils Relative 35 (*)     Lymphocytes Relative 55 (*)     All other components within normal limits  COMPREHENSIVE METABOLIC PANEL - Abnormal; Notable for the following:    AST 47 (*)     ALT 40 (*)     Alkaline Phosphatase 145 (*)     All other components within normal limits  ETHANOL  URINE RAPID DRUG SCREEN (HOSP PERFORMED)  GLUCOSE, CAPILLARY  AMMONIA   Ct Head Wo Contrast  07/07/2012  *RADIOLOGY  REPORT*  Clinical Data:  Fall, head trauma.  CT HEAD WITHOUT CONTRAST CT CERVICAL SPINE WITHOUT CONTRAST  Technique:  Multidetector CT imaging of the head and cervical spine was performed following the standard protocol without intravenous contrast.  Multiplanar CT image reconstructions of the cervical spine were also generated.  Comparison:  07/31/2010 MRI, 06/26/2009 head CT  CT HEAD  Findings: Periventricular and subcortical white matter hypodensities are most in keeping with chronic microangiopathic change. There is no evidence for acute hemorrhage, hydrocephalus, mass lesion, or abnormal extra-axial fluid collection.  No definite CT evidence for acute infarction.   No displaced calvarial fracture. The visualized paranasal sinuses and mastoid air cells are predominately clear.  IMPRESSION: White matter changes as above.  No definite acute intracranial abnormality.  CT CERVICAL SPINE  Findings: Mild apical opacities/scarring.  The the maintained craniocervical relationship.  No dens fracture.  Status post anterior plate and screw fixation at C6-7.  C5-6 degenerative disc disease.  Ligamentous calcification at C6 results in mild to moderate central canal narrowing.  No acute fracture or dislocation identified.  Paravertebral soft tissues within normal limits.  IMPRESSION: Postoperative changes at C6-7.  Degenerative disc disease at C5-6. No acute osseous abnormality.   Original Report Authenticated By: Waneta Martins, M.D.    Ct Cervical Spine Wo Contrast  07/07/2012  *RADIOLOGY REPORT*  Clinical Data:  Fall, head trauma.  CT HEAD WITHOUT CONTRAST CT CERVICAL SPINE WITHOUT CONTRAST  Technique:  Multidetector CT imaging of the head and cervical spine was performed following the standard protocol without intravenous contrast.  Multiplanar CT image reconstructions of the cervical spine were also generated.  Comparison:  07/31/2010 MRI, 06/26/2009 head CT  CT HEAD  Findings: Periventricular and subcortical  white matter hypodensities are most in keeping with chronic microangiopathic change. There is no evidence for acute hemorrhage, hydrocephalus, mass lesion, or abnormal extra-axial fluid collection.  No definite CT evidence for acute infarction.   No displaced calvarial fracture. The visualized paranasal sinuses and mastoid air cells are predominately clear.  IMPRESSION: White matter changes as above.  No definite acute intracranial abnormality.  CT CERVICAL SPINE  Findings: Mild apical opacities/scarring.  The the maintained craniocervical relationship.  No dens fracture.  Status post anterior plate and screw fixation at C6-7.  C5-6 degenerative disc disease.  Ligamentous calcification at C6 results in mild to moderate  central canal narrowing.  No acute fracture or dislocation identified.  Paravertebral soft tissues within normal limits.  IMPRESSION: Postoperative changes at C6-7.  Degenerative disc disease at C5-6. No acute osseous abnormality.   Original Report Authenticated By: Waneta Martins, M.D.     I personally reviewed the imaging tests through PACS system  I reviewed available ER/hospitalization records thought the EMR   1. Head injury   2. Concussion       MDM  Patient is well-appearing.  This may represent concussion.  Her mental status is significantly improving.  Process we consideration for possible seizure and postictal state although not convinced.  There is no noted seizure activity by the son.  I think this is all concussion.  Urine drug screen alcohol pneumonia all normal.  All these labs were ordered prior to my evaluation.        Lyanne Co, MD 07/08/12 [phone]

## 2012-09-02 ENCOUNTER — Emergency Department (HOSPITAL_COMMUNITY): Payer: 59

## 2012-09-02 ENCOUNTER — Observation Stay (HOSPITAL_COMMUNITY): Payer: 59

## 2012-09-02 ENCOUNTER — Observation Stay (HOSPITAL_COMMUNITY)
Admission: EM | Admit: 2012-09-02 | Discharge: 2012-09-03 | Disposition: A | Discharge status: Home / Self Care | Payer: 59 | Attending: Emergency Medicine | Admitting: Emergency Medicine

## 2012-09-02 ENCOUNTER — Encounter (HOSPITAL_COMMUNITY): Payer: Self-pay | Admitting: Emergency Medicine

## 2012-09-02 DIAGNOSIS — G8194 Hemiplegia, unspecified affecting left nondominant side: Secondary | ICD-10-CM

## 2012-09-02 DIAGNOSIS — R29898 Other symptoms and signs involving the musculoskeletal system: Principal | ICD-10-CM | POA: Insufficient documentation

## 2012-09-02 DIAGNOSIS — G35 Multiple sclerosis: Secondary | ICD-10-CM | POA: Insufficient documentation

## 2012-09-02 DIAGNOSIS — R4789 Other speech disturbances: Secondary | ICD-10-CM | POA: Insufficient documentation

## 2012-09-02 LAB — CBC WITH DIFFERENTIAL/PLATELET
Basophils Absolute: 0 10*3/uL (ref 0.0–0.1)
Eosinophils Absolute: 0.1 10*3/uL (ref 0.0–0.7)
Eosinophils Relative: 2 % (ref 0–5)
HCT: 41.4 % (ref 36.0–46.0)
MCH: 31.2 pg (ref 26.0–34.0)
MCHC: 36.2 g/dL — ABNORMAL HIGH (ref 30.0–36.0)
MCV: 86.1 fL (ref 78.0–100.0)
Monocytes Absolute: 0.3 10*3/uL (ref 0.1–1.0)
Platelets: 155 10*3/uL (ref 150–400)
RDW: 12.3 % (ref 11.5–15.5)
WBC: 4.5 10*3/uL (ref 4.0–10.5)

## 2012-09-02 LAB — COMPREHENSIVE METABOLIC PANEL
ALT: 40 U/L — ABNORMAL HIGH (ref 0–35)
AST: 53 U/L — ABNORMAL HIGH (ref 0–37)
CO2: 27 mEq/L (ref 19–32)
Calcium: 9.1 mg/dL (ref 8.4–10.5)
Creatinine, Ser: 0.71 mg/dL (ref 0.50–1.10)
GFR calc Af Amer: >90 mL/min (ref >90)
GFR calc non Af Amer: >90 mL/min (ref >90)
Sodium: 139 mEq/L (ref 135–145)
Total Protein: 7.5 g/dL (ref 6.0–8.3)

## 2012-09-02 LAB — URINALYSIS, ROUTINE W REFLEX MICROSCOPIC
Glucose, UA: NEGATIVE mg/dL
Ketones, ur: NEGATIVE mg/dL
Leukocytes, UA: NEGATIVE
Nitrite: NEGATIVE
Protein, ur: NEGATIVE mg/dL
pH: 6 (ref 5.0–8.0)

## 2012-09-02 LAB — RAPID URINE DRUG SCREEN, HOSP PERFORMED: Opiates: NOT DETECTED

## 2012-09-02 LAB — POCT I-STAT TROPONIN I: Troponin i, poc: 0.02 ng/mL (ref 0.00–0.08)

## 2012-09-02 LAB — PROTIME-INR: INR: 1.04 (ref 0.00–1.49)

## 2012-09-02 LAB — LIPID PANEL
HDL: 60 mg/dL (ref >39)
Triglycerides: 124 mg/dL (ref <150)

## 2012-09-02 MED ORDER — GABAPENTIN 400 MG PO CAPS
800.0000 mg | ORAL_CAPSULE | Freq: Three times a day (TID) | ORAL | Status: DC
  Administered 2012-09-03: 800 mg via ORAL
  Filled 2012-09-02 (×4): qty 2

## 2012-09-02 MED ORDER — CLONAZEPAM 0.5 MG PO TABS
0.5000 mg | ORAL_TABLET | Freq: Two times a day (BID) | ORAL | Status: DC | PRN
  Administered 2012-09-03: 1 mg via ORAL
  Filled 2012-09-02: qty 2

## 2012-09-02 MED ORDER — CLONIDINE HCL 0.1 MG PO TABS
0.1000 mg | ORAL_TABLET | Freq: Three times a day (TID) | ORAL | Status: DC | PRN
  Administered 2012-09-03: 0.1 mg via ORAL
  Filled 2012-09-02: qty 1

## 2012-09-02 MED ORDER — OXYCODONE HCL 5 MG PO TABS
10.0000 mg | ORAL_TABLET | Freq: Four times a day (QID) | ORAL | Status: DC | PRN
  Administered 2012-09-03: 10 mg via ORAL
  Filled 2012-09-02: qty 2

## 2012-09-02 MED ORDER — CYCLOBENZAPRINE HCL 10 MG PO TABS
10.0000 mg | ORAL_TABLET | Freq: Every day | ORAL | Status: DC
  Administered 2012-09-03: 20 mg via ORAL
  Filled 2012-09-02: qty 2

## 2012-09-02 NOTE — ED Notes (Signed)
Patient transported to CT 

## 2012-09-02 NOTE — ED Provider Notes (Signed)
9:00 PM Assumed care of patient in the CDU from Tatyana A Kirichenko, PA-C.  Patient is on TIA protocol.  Earlier today patient developed some weakness of her left hand and some slurred speech.  Symptoms have resolved at this time.  Patient also began having pain in her neck.  PMH significant for neck surgery last year.  CT head w/o contrast did not show any acute abnormalities.  Labs unremarkable.  Plan is for the patient to have a MRI brain, MRI cervical spine, and MRA brain.  Patient is also to have Carotid Doppler in the morning.  Patient had a recent Echocardiogram of her heart a few weeks ago.  Reassessed patient.  No acute distress, Alert and orientated x 3, Heart RRR, Lungs CTAB, PEARLA, EOM intact, CN II-XII grossly intact, Grip strength 5/5 bilaterally.    11:51 PM Results of the imaging as follows: MRI CERVICAL SPINE WITHOUT CONTRAST IMPRESSION:  1. Mild focal central and bilateral foraminal narrowing at C4-5, worse on the right. 2. Moderate central canal stenosis at C5-6. 3. Mild foraminal narrowing bilaterally at C5-6 is worse on the left. 4. Status post fusion at C6-7 without significant stenosis. 5. Moderate left central and mild left foraminal stenosis at C7- T1.  MRI brain: IMPRESSION:  1. Extensive periventricular and subcortical white matter change, advanced for age, but stable from the prior exam. The finding is nonspecific but can be seen in the setting of chronic microvascular ischemia, a demyelinating process such as multiple sclerosis, vasculitis, complicated migraine headaches, or as the sequelae of a prior infectious or inflammatory process. 2. No acute intracranial abnormality. 3. Minimal sphenoid sinus disease.   MRA brain IMPRESSION: Normal variant MRA circle of Willis without evidence for significant proximal stenosis, aneurysm, or branch vessel occlusion   Discussed the results with the patient.  Patient is currently being worked up for MS  outpatient.  Patient is to have a Carotid Doppler in the morning.     Pascal Lux [place], PA-C 09/03/12 0003

## 2012-09-02 NOTE — ED Provider Notes (Signed)
History     CSN: [id number]  Arrival date & time 09/02/12  1713   First MD Initiated Contact with Patient 09/02/12 1725      No chief complaint on file.   (Consider location/radiation/quality/duration/timing/severity/associated sxs/prior treatment) HPI Comments: Becky Wolfe is a 51 y.o. Female who presents with complaint of left sided weakness, cramping in left hand, intermittent speech problem, neck pain. Pt states she was walking to her mailbox when noted that her left hand cramped up, was painful, and she could not separate her thumb from her index finger. States she was unable to move her hand, and felt like entire left side was 'weak.'   Pt states she came inside, and felt like everything was dark. She has then called her family and told them that she was having a stroke. Per family, pt was able to talk to them, but her speech was slurred intermittently. She was oriented. Husband states when he got to the house, he called ems, and pt was given some tests to do. According to him, pt was able to repeat sentence she was asked to say the firest time, but not again the 2nd time. Pt was unable to hold her left arm in front of her. Pt was unable to smile on both sides. Per EMS, pt was not able to hold left arm for them the first time, but was able to hold int just a minute later. Pt also states she developed neck pain and "warm sensation" while in EMS. State that is still present. Pt denies fever, chills, malaise prior to this incident. Symptoms began at 4pm.     Past Medical History  Diagnosis Date  . Multiple sclerosis   . RSD (reflex sympathetic dystrophy)   . Hepatitis C     Past Surgical History  Procedure Date  . Neck surgery   . Cesarean section   . Abdominal hysterectomy     No family history on file.  History  Substance Use Topics  . Smoking status: Current Every Day Smoker  . Smokeless tobacco: Not on file  . Alcohol Use: No    OB History    Grav Para Term  Preterm Abortions TAB SAB Ect Mult Living                  Review of Systems  Constitutional: Negative for fever, chills and fatigue.  HENT: Positive for neck pain. Negative for neck stiffness.   Eyes: Positive for visual disturbance. Negative for photophobia.  Respiratory: Negative.   Cardiovascular: Negative.   Gastrointestinal: Negative.   Genitourinary: Negative for dysuria and flank pain.  Neurological: Positive for speech difficulty, weakness, numbness and headaches. Negative for dizziness.  Hematological: Negative.   Psychiatric/Behavioral: Negative for confusion and agitation.    Allergies  Lodine  Home Medications   Current Outpatient Rx  Name Route Sig Dispense Refill  . CLONAZEPAM 0.5 MG PO TABS Oral Take 0.5-1 mg by mouth 2 (two) times daily as needed. For anxiety.    Marland Kitchen CLONIDINE HCL 0.1 MG PO TABS Oral Take 0.1 mg by mouth every 8 (eight) hours as needed. For anxiety.    . CYCLOBENZAPRINE HCL 10 MG PO TABS Oral Take 10-20 mg by mouth at bedtime.     Marland Kitchen ESTRADIOL 0.1 MG/24HR TD PTTW Transdermal Place 1 patch onto the skin 2 (two) times a week.    Marland Kitchen GABAPENTIN 400 MG PO CAPS Oral Take 800 mg by mouth 3 (three) times daily.    Marland Kitchen  TAPENTADOL HCL 50 MG PO TABS Oral Take 200 mg by mouth 3 (three) times daily as needed. Pain      LMP 10/16/2011  Physical Exam  Nursing note and vitals reviewed. Constitutional: She is oriented to person, place, and time. She appears well-developed and well-nourished. No distress.  HENT:  Head: Normocephalic and atraumatic.  Right Ear: External ear normal.  Left Ear: External ear normal.  Nose: Nose normal.  Mouth/Throat: Oropharynx is clear and moist.  Eyes: Conjunctivae normal and EOM are normal. Pupils are equal, round, and reactive to light.  Neck: Normal range of motion. Neck supple.       Tender to midline and paravertebral cervical spine bilaterally, worse on the right side. Full ROM of the neck.  Cardiovascular: Normal rate,  regular rhythm and normal heart sounds.   Pulmonary/Chest: Effort normal and breath sounds normal. No respiratory distress. She has no wheezes. She has no rales.  Abdominal: Soft. Bowel sounds are normal. She exhibits no distension. There is no tenderness. There is no rebound.  Musculoskeletal: She exhibits no edema.  Neurological: She is alert and oriented to person, place, and time. No cranial nerve deficit. Coordination normal.       5/5 and equal upper and lower extremity strength bilaterally. Equal grip strength bilaterally. Normal finger to nose and heel to shin. No pronator drift. Patellar reflexes 2+. Visual fields intact   Skin: Skin is warm and dry.  Psychiatric: She has a normal mood and affect.    ED Course  Procedures (including critical care time)  5:45 PM  Pt seen and examined. Pt with intermittent weakness to the left hand, intermittent slurred speech. No exam findings on my exam at present. Suspect TIA vs MS, apparently pt is being worked up for but not officially diagnosed with MS yet. Will get CT, labs, monitor.   Results for orders placed during the hospital encounter of 09/02/12  CBC WITH DIFFERENTIAL      Component Value Range   WBC 4.5  4.0 - 10.5 K/uL   RBC 4.81  3.87 - 5.11 MIL/uL   Hemoglobin 15.0  12.0 - 15.0 g/dL   HCT 16.1  09.6 - 04.5 %   MCV 86.1  78.0 - 100.0 fL   MCH 31.2  26.0 - 34.0 pg   MCHC 36.2 (*) 30.0 - 36.0 g/dL   RDW 40.9  81.1 - 91.4 %   Platelets 155  150 - 400 K/uL   Neutrophils Relative 36 (*) 43 - 77 %   Neutro Abs 1.6 (*) 1.7 - 7.7 K/uL   Lymphocytes Relative 54 (*) 12 - 46 %   Lymphs Abs 2.4  0.7 - 4.0 K/uL   Monocytes Relative 7  3 - 12 %   Monocytes Absolute 0.3  0.1 - 1.0 K/uL   Eosinophils Relative 2  0 - 5 %   Eosinophils Absolute 0.1  0.0 - 0.7 K/uL   Basophils Relative 0  0 - 1 %   Basophils Absolute 0.0  0.0 - 0.1 K/uL  COMPREHENSIVE METABOLIC PANEL      Component Value Range   Sodium 139  135 - 145 mEq/L   Potassium  4.2  3.5 - 5.1 mEq/L   Chloride 103  96 - 112 mEq/L   CO2 27  19 - 32 mEq/L   Glucose, Bld 97  70 - 99 mg/dL   BUN 9  6 - 23 mg/dL   Creatinine, Ser 7.82  0.50 - 1.10 mg/dL  Calcium 9.1  8.4 - 10.5 mg/dL   Total Protein 7.5  6.0 - 8.3 g/dL   Albumin 3.5  3.5 - 5.2 g/dL   AST 53 (*) 0 - 37 U/L   ALT 40 (*) 0 - 35 U/L   Alkaline Phosphatase 152 (*) 39 - 117 U/L   Total Bilirubin 0.6  0.3 - 1.2 mg/dL   GFR calc non Af Amer >90  >90 mL/min   GFR calc Af Amer >90  >90 mL/min  PROTIME-INR      Component Value Range   Prothrombin Time 13.5  11.6 - 15.2 seconds   INR 1.04  0.00 - 1.49  URINALYSIS, ROUTINE W REFLEX MICROSCOPIC      Component Value Range   Color, Urine YELLOW  YELLOW   APPearance HAZY (*) CLEAR   Specific Gravity, Urine 1.014  1.005 - 1.030   pH 6.0  5.0 - 8.0   Glucose, UA NEGATIVE  NEGATIVE mg/dL   Hgb urine dipstick NEGATIVE  NEGATIVE   Bilirubin Urine NEGATIVE  NEGATIVE   Ketones, ur NEGATIVE  NEGATIVE mg/dL   Protein, ur NEGATIVE  NEGATIVE mg/dL   Urobilinogen, UA 1.0  0.0 - 1.0 mg/dL   Nitrite NEGATIVE  NEGATIVE   Leukocytes, UA NEGATIVE  NEGATIVE  URINE RAPID DRUG SCREEN (HOSP PERFORMED)      Component Value Range   Opiates NONE DETECTED  NONE DETECTED   Cocaine NONE DETECTED  NONE DETECTED   Benzodiazepines NONE DETECTED  NONE DETECTED   Amphetamines NONE DETECTED  NONE DETECTED   Tetrahydrocannabinol NONE DETECTED  NONE DETECTED   Barbiturates NONE DETECTED  NONE DETECTED  POCT I-STAT TROPONIN I      Component Value Range   Troponin i, poc 0.02  0.00 - 0.08 ng/mL   Comment 3           LIPID PANEL      Component Value Range   Cholesterol 129  0 - 200 mg/dL   Triglycerides 161  <096 mg/dL   HDL 60  >04 mg/dL   Total CHOL/HDL Ratio 2.2     VLDL 25  0 - 40 mg/dL   LDL Cholesterol 44  0 - 99 mg/dL  HEMOGLOBIN V4U      Component Value Range   Hemoglobin A1C 5.6  <5.7 %   Mean Plasma Glucose 114  <117 mg/dL   Ct Head Wo Contrast  09/02/2012   *RADIOLOGY REPORT*  Clinical Data: Neck pain, headache, left arm and leg weakness, sensation of intense heat at head and neck  CT HEAD WITHOUT CONTRAST  Technique:  Contiguous axial images were obtained from the base of the skull through the vertex without contrast.  Comparison: 07/07/2012  Findings: Beam hardening artifacts at skull base and brain stem. Normal ventricular morphology. No midline shift or mass effect. Chronic poorly defined areas of white matter hypoattenuation likely small vessel chronic ischemic change. No intracranial hemorrhage, mass lesion, or evidence of acute infarction. No extra-axial fluid collections. Visualized paranasal sinuses and mastoid air cells clear. No acute osseous findings.  IMPRESSION: Chronic white matter hypoattenuation likely related to small vessel chronic ischemic change. No acute intracranial abnormalities.   Original Report Authenticated By: Lollie Marrow, M.D.    Pt placed in CDU under TIA protocol. Pt had recent ECHO, spoke with Dr. Dione Housekeeper, who stated normal EF 60% with no significant abnormalities. PT agreeable to stay over night for further testing. symptomc currently completely resolved.     No  diagnosis found.    MDM          Lottie Mussel, PA 09/04/12 [phone]

## 2012-09-02 NOTE — ED Notes (Signed)
Pt st's while walking from mail box she could not separate left index and thumb and had pain in left hand.  St's then she developed bil leg weakness.  Family st's she called them and told them she was having a stroke.  Speech was appropriate at that time.  St's speech would be normal then would not.  St's abnormal speech would come and go.  On EMS arrival pt was alert and oriented x's 3.  Enroute to ED when asked to hold both arms up pt would drop left arm but when asked to try harder pt would hold bil arms up with no drift.

## 2012-09-03 DIAGNOSIS — G8190 Hemiplegia, unspecified affecting unspecified side: Secondary | ICD-10-CM

## 2012-09-03 LAB — HEMOGLOBIN A1C: Hgb A1c MFr Bld: 5.6 % (ref <5.7)

## 2012-09-03 MED ORDER — HYDROMORPHONE HCL PF 1 MG/ML IJ SOLN
1.0000 mg | Freq: Once | INTRAMUSCULAR | Status: AC
  Administered 2012-09-03: 1 mg via INTRAVENOUS
  Filled 2012-09-03: qty 1

## 2012-09-03 MED ORDER — ONDANSETRON HCL 4 MG/2ML IJ SOLN
4.0000 mg | Freq: Once | INTRAMUSCULAR | Status: AC
  Administered 2012-09-03: 4 mg via INTRAVENOUS
  Filled 2012-09-03: qty 2

## 2012-09-03 NOTE — ED Notes (Signed)
Pt requested to eat/ Notified PA Verl Bangs agreed that pt good have something to eat

## 2012-09-03 NOTE — ED Notes (Signed)
IV team at bedside 

## 2012-09-03 NOTE — ED Provider Notes (Signed)
Medical screening examination/treatment/procedure(s) were performed by non-physician practitioner and as supervising physician I was immediately available for consultation/collaboration.   Issiah Huffaker B. Bernette Mayers, MD 09/03/12 1026

## 2012-09-03 NOTE — ED Notes (Signed)
Pt has holter monitor lying at bedside.  Pt states she removed the monitor last night due to inability to access the phone that works with the monitor.

## 2012-09-03 NOTE — Progress Notes (Signed)
  Vascular lab preliminary report Carotid Doppler has been completed. Bilateral:  No evidence of hemodynamically significant internal carotid artery stenosis.   Vertebral artery flow is antegrade.    Farrel Demark, RDMS, RVT

## 2012-09-03 NOTE — ED Provider Notes (Signed)
Physical Exam  BP 124/63  Pulse 67  Temp 97.8 F (36.6 C) (Oral)  Resp 18  SpO2 100%  LMP 10/16/2011  Physical Exam  ED Course  Procedures  MDM  Assumed care of patient in the CDU. Patient is on TIA protocol. Yesterday patient developed some weakness of her left hand and some slurred speech. Symptoms have resolved at this time. Patient also began having pain in her neck. PMH significant for neck surgery last year. CT head w/o contrast did not show any acute abnormalities. Labs unremarkable. Brain MRI/MRA shows finding suggestive of MS, pt currently being work up for MS outpatient. Patient is also to have Carotid Doppler in the morning. Patient had a recent Echocardiogram of her heart a few weeks ago.  Patient reports overnight her RSD disease is causing her R leg to spasm and increasing pain because she was unable to take her daily meds.  Otherwise denies worsening neck pain, and denies numbness/weakness to her L side. On exam, pt appears in no acute distress, Alert and orientated x 3, Heart RRR, Lungs CTAB, PEARLA, EOM intact, CN II-XII grossly intact, Grip strength 5/5 bilaterally.    Pt does have a neurologist, Dr. Terrace Arabia who previously diagnosed her with MS.  However, pt has second opinion from [place], and sts they did not think it is MS.  She is currently being work up outpatient at [facility].    9:36 AM Carotid doppler result is unremarkable.  Will d/c pt and have her f/u with her neurologist for outpt work up.  Pt voice understanding and agrees with plan.     Author:  Glendale Chard  Service:  Vascular Lab  Author Type:  Cardiovascular Sonographer   Filed:  09/03/12 0844  Note Time:  09/03/12 0843          Vascular lab preliminary report  Carotid Doppler has been completed.  Bilateral: No evidence of hemodynamically significant internal carotid artery stenosis. Vertebral artery flow is antegrade.  Farrel Demark, RDMS, RVT   Results for orders placed during the hospital encounter of  09/02/12  CBC WITH DIFFERENTIAL      Component Value Range   WBC 4.5  4.0 - 10.5 K/uL   RBC 4.81  3.87 - 5.11 MIL/uL   Hemoglobin 15.0  12.0 - 15.0 g/dL   HCT 96.0  45.4 - 09.8 %   MCV 86.1  78.0 - 100.0 fL   MCH 31.2  26.0 - 34.0 pg   MCHC 36.2 (*) 30.0 - 36.0 g/dL   RDW 11.9  14.7 - 82.9 %   Platelets 155  150 - 400 K/uL   Neutrophils Relative 36 (*) 43 - 77 %   Neutro Abs 1.6 (*) 1.7 - 7.7 K/uL   Lymphocytes Relative 54 (*) 12 - 46 %   Lymphs Abs 2.4  0.7 - 4.0 K/uL   Monocytes Relative 7  3 - 12 %   Monocytes Absolute 0.3  0.1 - 1.0 K/uL   Eosinophils Relative 2  0 - 5 %   Eosinophils Absolute 0.1  0.0 - 0.7 K/uL   Basophils Relative 0  0 - 1 %   Basophils Absolute 0.0  0.0 - 0.1 K/uL  COMPREHENSIVE METABOLIC PANEL      Component Value Range   Sodium 139  135 - 145 mEq/L   Potassium 4.2  3.5 - 5.1 mEq/L   Chloride 103  96 - 112 mEq/L   CO2 27  19 - 32 mEq/L  Glucose, Bld 97  70 - 99 mg/dL   BUN 9  6 - 23 mg/dL   Creatinine, Ser 0.86  0.50 - 1.10 mg/dL   Calcium 9.1  8.4 - 57.8 mg/dL   Total Protein 7.5  6.0 - 8.3 g/dL   Albumin 3.5  3.5 - 5.2 g/dL   AST 53 (*) 0 - 37 U/L   ALT 40 (*) 0 - 35 U/L   Alkaline Phosphatase 152 (*) 39 - 117 U/L   Total Bilirubin 0.6  0.3 - 1.2 mg/dL   GFR calc non Af Amer >90  >90 mL/min   GFR calc Af Amer >90  >90 mL/min  PROTIME-INR      Component Value Range   Prothrombin Time 13.5  11.6 - 15.2 seconds   INR 1.04  0.00 - 1.49  URINALYSIS, ROUTINE W REFLEX MICROSCOPIC      Component Value Range   Color, Urine YELLOW  YELLOW   APPearance HAZY (*) CLEAR   Specific Gravity, Urine 1.014  1.005 - 1.030   pH 6.0  5.0 - 8.0   Glucose, UA NEGATIVE  NEGATIVE mg/dL   Hgb urine dipstick NEGATIVE  NEGATIVE   Bilirubin Urine NEGATIVE  NEGATIVE   Ketones, ur NEGATIVE  NEGATIVE mg/dL   Protein, ur NEGATIVE  NEGATIVE mg/dL   Urobilinogen, UA 1.0  0.0 - 1.0 mg/dL   Nitrite NEGATIVE  NEGATIVE   Leukocytes, UA NEGATIVE  NEGATIVE  URINE RAPID  DRUG SCREEN (HOSP PERFORMED)      Component Value Range   Opiates NONE DETECTED  NONE DETECTED   Cocaine NONE DETECTED  NONE DETECTED   Benzodiazepines NONE DETECTED  NONE DETECTED   Amphetamines NONE DETECTED  NONE DETECTED   Tetrahydrocannabinol NONE DETECTED  NONE DETECTED   Barbiturates NONE DETECTED  NONE DETECTED  POCT I-STAT TROPONIN I      Component Value Range   Troponin i, poc 0.02  0.00 - 0.08 ng/mL   Comment 3           LIPID PANEL      Component Value Range   Cholesterol 129  0 - 200 mg/dL   Triglycerides 469  <629 mg/dL   HDL 60  >52 mg/dL   Total CHOL/HDL Ratio 2.2     VLDL 25  0 - 40 mg/dL   LDL Cholesterol 44  0 - 99 mg/dL   Ct Head Wo Contrast  09/02/2012  *RADIOLOGY REPORT*  Clinical Data: Neck pain, headache, left arm and leg weakness, sensation of intense heat at head and neck  CT HEAD WITHOUT CONTRAST  Technique:  Contiguous axial images were obtained from the base of the skull through the vertex without contrast.  Comparison: 07/07/2012  Findings: Beam hardening artifacts at skull base and brain stem. Normal ventricular morphology. No midline shift or mass effect. Chronic poorly defined areas of white matter hypoattenuation likely small vessel chronic ischemic change. No intracranial hemorrhage, mass lesion, or evidence of acute infarction. No extra-axial fluid collections. Visualized paranasal sinuses and mastoid air cells clear. No acute osseous findings.  IMPRESSION: Chronic white matter hypoattenuation likely related to small vessel chronic ischemic change. No acute intracranial abnormalities.   Original Report Authenticated By: Lollie Marrow, M.D.    Mr Brain Wo Contrast  09/02/2012  *RADIOLOGY REPORT*  Clinical Data:  Left-sided weakness.  Dizziness and confusion.  MRI HEAD WITHOUT CONTRAST MRA HEAD WITHOUT CONTRAST  Technique:  Multiplanar, multiecho pulse sequences of the brain and surrounding structures  were obtained without intravenous contrast.  Angiographic images of the head were obtained using MRA technique without contrast.  Comparison:  CT head without contrast 09/02/2012.  MRI brain 07/31/2010.  MRI HEAD  Findings:  The diffusion weighted images demonstrate no evidence for acute or subacute infarction.  Extensive periventricular subcortical white matter change is present.  This is advanced for age, but stable from the prior exam. No hemorrhage or mass lesion is present.  The ventricles are of normal size.  No significant extra-axial fluid collection is present.  Flow is present in the major intracranial arteries.  The globes and orbits are intact.  Mild mucosal thickening is present in the anterior ethmoid air cells.  A fluid level is present in the sphenoid sinus.  The mastoid air cells are clear.  IMPRESSION:  1.  Extensive periventricular and subcortical white matter change, advanced for age, but stable from the prior exam. The finding is nonspecific but can be seen in the setting of chronic microvascular ischemia, a demyelinating process such as multiple sclerosis, vasculitis, complicated migraine headaches, or as the sequelae of a prior infectious or inflammatory process. 2.  No acute intracranial abnormality. 3.  Minimal sphenoid sinus disease.  MRA HEAD  Findings: The internal carotid arteries are within normal limits from the high cervical segments through the ICA termini.  The A1 and M1 segments are normal.  The anterior communicating artery is patent.  The MCA bifurcations are normal.  The ACA and MCA branch vessels are within normal limits.  The right vertebral artery is the dominant vessel.  The right PICA origin is visualized and normal.  The left PICA is not visualized. The basilar artery is within normal limits.  Both posterior cerebral arteries originate from basilar tip.  The PCA branch vessels are within normal limits.  IMPRESSION: Normal variant MRA circle of Willis without evidence for significant proximal stenosis, aneurysm, or  branch vessel occlusion.  *RADIOLOGY REPORT*  MRI CERVICAL SPINE WITHOUT CONTRAST  Technique:  Multiplanar and multiecho pulse sequences of the cervical spine, to include the craniocervical junction and cervicothoracic junction, were obtained without intravenous contrast.  Comparison:   None  Findings:  Normal signal is present in the cervical and upper thoracic spinal cord to the lowest imaged level, T2.  The craniocervical junction is within normal limits.  The soft tissues are unremarkable.  The patient is status post ACDF at C6-7.  T1 marrow signal is depressed at C5.  C2-3:  Negative.  C3-4:  A shallow central disc protrusion is present.  C4-5:  A more prominent central disc protrusion contacts the ventral surface of the cord.  Uncovertebral disease results in mild foraminal narrowing bilaterally, right greater than left.  C5-6:  A prominent central disc protrusion contacts distorts the ventral surface of the cord.  The central canal is narrowed to 7 mm.  Mild foraminal narrowing is present bilaterally, worse on the left.  C6-7:  The patient is fused.  Minimal residual osteophyte formation is evident centrally without a significant stenosis.  C7-T1:  A leftward disc osteophyte complex effaces the ventral CSF and contacts the ventral surface of the cord.  Mild left foraminal narrowing is present.  T1-2:  Negative.  IMPRESSION:  1.  Mild focal central and bilateral foraminal narrowing at C4-5, worse on the right. 2.  Moderate central canal stenosis at C5-6. 3.  Mild foraminal narrowing bilaterally at C5-6 is worse on the left. 4.  Status post fusion at C6-7 without significant stenosis. 5.  Moderate left central and mild left foraminal stenosis at C7- T1.   Original Report Authenticated By: Jamesetta Orleans. MATTERN, M.D.    Mr Cervical Spine Wo Contrast  09/02/2012  *RADIOLOGY REPORT*  Clinical Data:  Left-sided weakness.  Dizziness and confusion.  MRI HEAD WITHOUT CONTRAST MRA HEAD WITHOUT CONTRAST  Technique:   Multiplanar, multiecho pulse sequences of the brain and surrounding structures were obtained without intravenous contrast. Angiographic images of the head were obtained using MRA technique without contrast.  Comparison:  CT head without contrast 09/02/2012.  MRI brain 07/31/2010.  MRI HEAD  Findings:  The diffusion weighted images demonstrate no evidence for acute or subacute infarction.  Extensive periventricular subcortical white matter change is present.  This is advanced for age, but stable from the prior exam. No hemorrhage or mass lesion is present.  The ventricles are of normal size.  No significant extra-axial fluid collection is present.  Flow is present in the major intracranial arteries.  The globes and orbits are intact.  Mild mucosal thickening is present in the anterior ethmoid air cells.  A fluid level is present in the sphenoid sinus.  The mastoid air cells are clear.  IMPRESSION:  1.  Extensive periventricular and subcortical white matter change, advanced for age, but stable from the prior exam. The finding is nonspecific but can be seen in the setting of chronic microvascular ischemia, a demyelinating process such as multiple sclerosis, vasculitis, complicated migraine headaches, or as the sequelae of a prior infectious or inflammatory process. 2.  No acute intracranial abnormality. 3.  Minimal sphenoid sinus disease.  MRA HEAD  Findings: The internal carotid arteries are within normal limits from the high cervical segments through the ICA termini.  The A1 and M1 segments are normal.  The anterior communicating artery is patent.  The MCA bifurcations are normal.  The ACA and MCA branch vessels are within normal limits.  The right vertebral artery is the dominant vessel.  The right PICA origin is visualized and normal.  The left PICA is not visualized. The basilar artery is within normal limits.  Both posterior cerebral arteries originate from basilar tip.  The PCA branch vessels are within normal  limits.  IMPRESSION: Normal variant MRA circle of Willis without evidence for significant proximal stenosis, aneurysm, or branch vessel occlusion.  *RADIOLOGY REPORT*  MRI CERVICAL SPINE WITHOUT CONTRAST  Technique:  Multiplanar and multiecho pulse sequences of the cervical spine, to include the craniocervical junction and cervicothoracic junction, were obtained without intravenous contrast.  Comparison:   None  Findings:  Normal signal is present in the cervical and upper thoracic spinal cord to the lowest imaged level, T2.  The craniocervical junction is within normal limits.  The soft tissues are unremarkable.  The patient is status post ACDF at C6-7.  T1 marrow signal is depressed at C5.  C2-3:  Negative.  C3-4:  A shallow central disc protrusion is present.  C4-5:  A more prominent central disc protrusion contacts the ventral surface of the cord.  Uncovertebral disease results in mild foraminal narrowing bilaterally, right greater than left.  C5-6:  A prominent central disc protrusion contacts distorts the ventral surface of the cord.  The central canal is narrowed to 7 mm.  Mild foraminal narrowing is present bilaterally, worse on the left.  C6-7:  The patient is fused.  Minimal residual osteophyte formation is evident centrally without a significant stenosis.  C7-T1:  A leftward disc osteophyte complex effaces the ventral CSF and contacts the  ventral surface of the cord.  Mild left foraminal narrowing is present.  T1-2:  Negative.  IMPRESSION:  1.  Mild focal central and bilateral foraminal narrowing at C4-5, worse on the right. 2.  Moderate central canal stenosis at C5-6. 3.  Mild foraminal narrowing bilaterally at C5-6 is worse on the left. 4.  Status post fusion at C6-7 without significant stenosis. 5.  Moderate left central and mild left foraminal stenosis at C7- T1.   Original Report Authenticated By: Jamesetta Orleans. MATTERN, M.D.    Mr Maxine Glenn Head/brain Wo Cm  09/02/2012  *RADIOLOGY REPORT*  Clinical  Data:  Left-sided weakness.  Dizziness and confusion.  MRI HEAD WITHOUT CONTRAST MRA HEAD WITHOUT CONTRAST  Technique:  Multiplanar, multiecho pulse sequences of the brain and surrounding structures were obtained without intravenous contrast. Angiographic images of the head were obtained using MRA technique without contrast.  Comparison:  CT head without contrast 09/02/2012.  MRI brain 07/31/2010.  MRI HEAD  Findings:  The diffusion weighted images demonstrate no evidence for acute or subacute infarction.  Extensive periventricular subcortical white matter change is present.  This is advanced for age, but stable from the prior exam. No hemorrhage or mass lesion is present.  The ventricles are of normal size.  No significant extra-axial fluid collection is present.  Flow is present in the major intracranial arteries.  The globes and orbits are intact.  Mild mucosal thickening is present in the anterior ethmoid air cells.  A fluid level is present in the sphenoid sinus.  The mastoid air cells are clear.  IMPRESSION:  1.  Extensive periventricular and subcortical white matter change, advanced for age, but stable from the prior exam. The finding is nonspecific but can be seen in the setting of chronic microvascular ischemia, a demyelinating process such as multiple sclerosis, vasculitis, complicated migraine headaches, or as the sequelae of a prior infectious or inflammatory process. 2.  No acute intracranial abnormality. 3.  Minimal sphenoid sinus disease.  MRA HEAD  Findings: The internal carotid arteries are within normal limits from the high cervical segments through the ICA termini.  The A1 and M1 segments are normal.  The anterior communicating artery is patent.  The MCA bifurcations are normal.  The ACA and MCA branch vessels are within normal limits.  The right vertebral artery is the dominant vessel.  The right PICA origin is visualized and normal.  The left PICA is not visualized. The basilar artery is within  normal limits.  Both posterior cerebral arteries originate from basilar tip.  The PCA branch vessels are within normal limits.  IMPRESSION: Normal variant MRA circle of Willis without evidence for significant proximal stenosis, aneurysm, or branch vessel occlusion.  *RADIOLOGY REPORT*  MRI CERVICAL SPINE WITHOUT CONTRAST  Technique:  Multiplanar and multiecho pulse sequences of the cervical spine, to include the craniocervical junction and cervicothoracic junction, were obtained without intravenous contrast.  Comparison:   None  Findings:  Normal signal is present in the cervical and upper thoracic spinal cord to the lowest imaged level, T2.  The craniocervical junction is within normal limits.  The soft tissues are unremarkable.  The patient is status post ACDF at C6-7.  T1 marrow signal is depressed at C5.  C2-3:  Negative.  C3-4:  A shallow central disc protrusion is present.  C4-5:  A more prominent central disc protrusion contacts the ventral surface of the cord.  Uncovertebral disease results in mild foraminal narrowing bilaterally, right greater than left.  C5-6:  A prominent central disc protrusion contacts distorts the ventral surface of the cord.  The central canal is narrowed to 7 mm.  Mild foraminal narrowing is present bilaterally, worse on the left.  C6-7:  The patient is fused.  Minimal residual osteophyte formation is evident centrally without a significant stenosis.  C7-T1:  A leftward disc osteophyte complex effaces the ventral CSF and contacts the ventral surface of the cord.  Mild left foraminal narrowing is present.  T1-2:  Negative.  IMPRESSION:  1.  Mild focal central and bilateral foraminal narrowing at C4-5, worse on the right. 2.  Moderate central canal stenosis at C5-6. 3.  Mild foraminal narrowing bilaterally at C5-6 is worse on the left. 4.  Status post fusion at C6-7 without significant stenosis. 5.  Moderate left central and mild left foraminal stenosis at C7- T1.   Original Report  Authenticated By: Jamesetta Orleans. MATTERN, M.D.       Fayrene Helper, PA-C 09/03/12 [phone]

## 2012-09-03 NOTE — ED Notes (Signed)
Received call back from IV team

## 2012-09-03 NOTE — ED Notes (Signed)
Ultrasound called.  Transport in route for patient procedure

## 2012-09-03 NOTE — ED Notes (Signed)
Pt information taken in regard to cell phone that this RN is unable to locate.  Charge RN [employer] notified and contact information given to same.

## 2012-09-03 NOTE — ED Notes (Signed)
Attempted 3 times to  insert IV without success/ paged IV team

## 2012-09-03 NOTE — ED Notes (Signed)
Breakfast tray received.

## 2012-09-08 NOTE — ED Provider Notes (Signed)
Medical screening examination/treatment/procedure(s) were performed by non-physician practitioner and as supervising physician I was immediately available for consultation/collaboration.  Aiden Helzer, MD 09/08/12 2307 

## 2012-09-08 NOTE — ED Provider Notes (Signed)
Medical screening examination/treatment/procedure(s) were performed by non-physician practitioner and as supervising physician I was immediately available for consultation/collaboration.  Ellias Mcelreath, MD 09/08/12 2307 

## 2012-09-18 ENCOUNTER — Other Ambulatory Visit: Payer: Self-pay | Admitting: Neurosurgery

## 2012-09-18 ENCOUNTER — Encounter (HOSPITAL_COMMUNITY): Payer: Self-pay

## 2012-09-21 ENCOUNTER — Inpatient Hospital Stay (HOSPITAL_COMMUNITY): Payer: 59

## 2012-09-21 ENCOUNTER — Encounter (HOSPITAL_COMMUNITY): Payer: Self-pay | Admitting: *Deleted

## 2012-09-21 ENCOUNTER — Encounter (HOSPITAL_COMMUNITY)
Admission: RE | Disposition: A | Discharge status: Home / Self Care | Payer: Self-pay | Source: Ambulatory Visit | Attending: Neurosurgery

## 2012-09-21 ENCOUNTER — Inpatient Hospital Stay (HOSPITAL_COMMUNITY): Payer: 59 | Admitting: Certified Registered Nurse Anesthetist

## 2012-09-21 ENCOUNTER — Observation Stay (HOSPITAL_COMMUNITY)
Admission: RE | Admit: 2012-09-21 | Discharge: 2012-09-22 | Disposition: A | Discharge status: Home / Self Care | Payer: 59 | Source: Ambulatory Visit | Attending: Neurosurgery | Admitting: Neurosurgery

## 2012-09-21 ENCOUNTER — Encounter (HOSPITAL_COMMUNITY): Payer: Self-pay | Admitting: Certified Registered Nurse Anesthetist

## 2012-09-21 DIAGNOSIS — M5000 Cervical disc disorder with myelopathy, unspecified cervical region: Principal | ICD-10-CM | POA: Insufficient documentation

## 2012-09-21 DIAGNOSIS — I10 Essential (primary) hypertension: Secondary | ICD-10-CM | POA: Insufficient documentation

## 2012-09-21 DIAGNOSIS — M4712 Other spondylosis with myelopathy, cervical region: Secondary | ICD-10-CM | POA: Insufficient documentation

## 2012-09-21 DIAGNOSIS — F17200 Nicotine dependence, unspecified, uncomplicated: Secondary | ICD-10-CM | POA: Insufficient documentation

## 2012-09-21 HISTORY — DX: Pneumonia, unspecified organism: J18.9

## 2012-09-21 HISTORY — DX: Cardiac murmur, unspecified: R01.1

## 2012-09-21 HISTORY — DX: Anxiety disorder, unspecified: F41.9

## 2012-09-21 HISTORY — DX: Headache: R51

## 2012-09-21 HISTORY — DX: Cervicalgia: M54.2

## 2012-09-21 HISTORY — DX: Transient cerebral ischemic attack, unspecified: G45.9

## 2012-09-21 HISTORY — DX: Other chronic pain: G89.29

## 2012-09-21 HISTORY — DX: Bronchitis, not specified as acute or chronic: J40

## 2012-09-21 HISTORY — PX: ANTERIOR CERVICAL DECOMP/DISCECTOMY FUSION: SHX1161

## 2012-09-21 LAB — CBC
Hemoglobin: 14.5 g/dL (ref 12.0–15.0)
MCH: 31 pg (ref 26.0–34.0)
MCHC: 36 g/dL (ref 30.0–36.0)
Platelets: 174 10*3/uL (ref 150–400)
RDW: 12.4 % (ref 11.5–15.5)

## 2012-09-21 LAB — COMPREHENSIVE METABOLIC PANEL
ALT: 43 U/L — ABNORMAL HIGH (ref 0–35)
CO2: 25 mEq/L (ref 19–32)
Calcium: 9.2 mg/dL (ref 8.4–10.5)
Chloride: 105 mEq/L (ref 96–112)
GFR calc Af Amer: >90 mL/min (ref >90)
GFR calc non Af Amer: >90 mL/min (ref >90)
Glucose, Bld: 95 mg/dL (ref 70–99)
Sodium: 139 mEq/L (ref 135–145)
Total Bilirubin: 0.7 mg/dL (ref 0.3–1.2)

## 2012-09-21 SURGERY — ANTERIOR CERVICAL DECOMPRESSION/DISCECTOMY FUSION 2 LEVELS
Anesthesia: General | Site: Neck | Wound class: Clean

## 2012-09-21 MED ORDER — BISACODYL 10 MG RE SUPP: 10.0000 mg | Freq: Every day | RECTAL | Status: DC | PRN

## 2012-09-21 MED ORDER — CLONIDINE HCL 0.1 MG PO TABS
0.1000 mg | ORAL_TABLET | Freq: Three times a day (TID) | ORAL | Status: DC
  Filled 2012-09-21 (×4): qty 1

## 2012-09-21 MED ORDER — MUPIROCIN 2 % EX OINT
TOPICAL_OINTMENT | CUTANEOUS | Status: AC
  Filled 2012-09-21: qty 22

## 2012-09-21 MED ORDER — SODIUM CHLORIDE 0.9 % IV SOLN: 250.0000 mL | INTRAVENOUS | Status: DC

## 2012-09-21 MED ORDER — HYDROMORPHONE HCL PF 1 MG/ML IJ SOLN
0.2500 mg | INTRAMUSCULAR | Status: DC | PRN
  Administered 2012-09-21 (×4): 0.5 mg via INTRAVENOUS

## 2012-09-21 MED ORDER — LACTATED RINGERS IV SOLN
INTRAVENOUS | Status: DC
  Administered 2012-09-21: via INTRAVENOUS

## 2012-09-21 MED ORDER — MIDAZOLAM HCL 5 MG/5ML IJ SOLN
INTRAMUSCULAR | Status: DC | PRN
  Administered 2012-09-21: 2 mg via INTRAVENOUS

## 2012-09-21 MED ORDER — ROCURONIUM BROMIDE 100 MG/10ML IV SOLN
INTRAVENOUS | Status: DC | PRN
  Administered 2012-09-21 (×2): 10 mg via INTRAVENOUS
  Administered 2012-09-21: 50 mg via INTRAVENOUS
  Administered 2012-09-21 (×2): 10 mg via INTRAVENOUS
  Administered 2012-09-21: 30 mg via INTRAVENOUS

## 2012-09-21 MED ORDER — ARTIFICIAL TEARS OP OINT
TOPICAL_OINTMENT | OPHTHALMIC | Status: DC | PRN
  Administered 2012-09-21: 1 via OPHTHALMIC

## 2012-09-21 MED ORDER — LIDOCAINE-EPINEPHRINE 1 %-1:100000 IJ SOLN
INTRAMUSCULAR | Status: DC | PRN
  Administered 2012-09-21: 10 mL

## 2012-09-21 MED ORDER — ONDANSETRON HCL 4 MG/2ML IJ SOLN
INTRAMUSCULAR | Status: DC | PRN
  Administered 2012-09-21: 4 mg via INTRAVENOUS

## 2012-09-21 MED ORDER — 0.9 % SODIUM CHLORIDE (POUR BTL) OPTIME
TOPICAL | Status: DC | PRN
  Administered 2012-09-21: 1000 mL

## 2012-09-21 MED ORDER — LACTATED RINGERS IV SOLN
INTRAVENOUS | Status: DC | PRN
  Administered 2012-09-21: 14:00:00 via INTRAVENOUS

## 2012-09-21 MED ORDER — GLYCOPYRROLATE 0.2 MG/ML IJ SOLN
INTRAMUSCULAR | Status: DC | PRN
  Administered 2012-09-21: .3 mg via INTRAVENOUS

## 2012-09-21 MED ORDER — GABAPENTIN 800 MG PO TABS: 800.0000 mg | ORAL_TABLET | Freq: Three times a day (TID) | ORAL | Status: DC

## 2012-09-21 MED ORDER — METHOCARBAMOL 100 MG/ML IJ SOLN
500.0000 mg | Freq: Four times a day (QID) | INTRAMUSCULAR | Status: DC | PRN
  Administered 2012-09-21: 500 mg via INTRAVENOUS
  Filled 2012-09-21: qty 5

## 2012-09-21 MED ORDER — CLONAZEPAM 0.5 MG PO TABS: 1.0000 mg | ORAL_TABLET | Freq: Two times a day (BID) | ORAL | Status: DC | PRN

## 2012-09-21 MED ORDER — CYCLOBENZAPRINE HCL 10 MG PO TABS
10.0000 mg | ORAL_TABLET | Freq: Every day | ORAL | Status: DC
  Administered 2012-09-21: 10 mg via ORAL
  Filled 2012-09-21: qty 1
  Filled 2012-09-21: qty 2

## 2012-09-21 MED ORDER — DEXTROSE 5 % IV SOLN
INTRAVENOUS | Status: DC | PRN
  Administered 2012-09-21: 14:00:00 via INTRAVENOUS

## 2012-09-21 MED ORDER — DEXAMETHASONE SODIUM PHOSPHATE 4 MG/ML IJ SOLN
INTRAMUSCULAR | Status: DC | PRN
  Administered 2012-09-21: 8 mg via INTRAVENOUS

## 2012-09-21 MED ORDER — METHOCARBAMOL 500 MG PO TABS
500.0000 mg | ORAL_TABLET | Freq: Four times a day (QID) | ORAL | Status: DC | PRN
  Filled 2012-09-21: qty 1

## 2012-09-21 MED ORDER — HYDROCODONE-ACETAMINOPHEN 5-325 MG PO TABS: 1.0000 | ORAL_TABLET | ORAL | Status: DC | PRN

## 2012-09-21 MED ORDER — BACITRACIN 50000 UNITS IM SOLR
INTRAMUSCULAR | Status: AC
  Filled 2012-09-21: qty 1

## 2012-09-21 MED ORDER — DOCUSATE SODIUM 100 MG PO CAPS
100.0000 mg | ORAL_CAPSULE | Freq: Two times a day (BID) | ORAL | Status: DC
  Administered 2012-09-21 – 2012-09-22 (×2): 100 mg via ORAL
  Filled 2012-09-21 (×2): qty 1

## 2012-09-21 MED ORDER — MAGNESIUM HYDROXIDE 400 MG/5ML PO SUSP: 30.0000 mL | Freq: Every day | ORAL | Status: DC | PRN

## 2012-09-21 MED ORDER — HYDROMORPHONE HCL PF 1 MG/ML IJ SOLN
INTRAMUSCULAR | Status: AC
  Administered 2012-09-21: 0.5 mg via INTRAVENOUS
  Filled 2012-09-21: qty 1

## 2012-09-21 MED ORDER — SODIUM CHLORIDE 0.9 % IJ SOLN: 3.0000 mL | INTRAMUSCULAR | Status: DC | PRN

## 2012-09-21 MED ORDER — SODIUM CHLORIDE 0.9 % IJ SOLN
3.0000 mL | Freq: Two times a day (BID) | INTRAMUSCULAR | Status: DC
  Administered 2012-09-21: 3 mL via INTRAVENOUS

## 2012-09-21 MED ORDER — ACETAMINOPHEN 325 MG PO TABS: 650.0000 mg | ORAL_TABLET | ORAL | Status: DC | PRN

## 2012-09-21 MED ORDER — PROPOFOL 10 MG/ML IV BOLUS
INTRAVENOUS | Status: DC | PRN
  Administered 2012-09-21: 160 mg via INTRAVENOUS

## 2012-09-21 MED ORDER — MUPIROCIN 2 % EX OINT
TOPICAL_OINTMENT | Freq: Two times a day (BID) | CUTANEOUS | Status: DC
  Administered 2012-09-21: 1 via NASAL
  Filled 2012-09-21: qty 22

## 2012-09-21 MED ORDER — SODIUM CHLORIDE 0.9 % IV SOLN
INTRAVENOUS | Status: AC
  Filled 2012-09-21: qty 500

## 2012-09-21 MED ORDER — PROMETHAZINE HCL 25 MG/ML IJ SOLN: 6.2500 mg | INTRAMUSCULAR | Status: DC | PRN

## 2012-09-21 MED ORDER — ACETAMINOPHEN 650 MG RE SUPP: 650.0000 mg | RECTAL | Status: DC | PRN

## 2012-09-21 MED ORDER — TAPENTADOL HCL 50 MG PO TABS
100.0000 mg | ORAL_TABLET | Freq: Three times a day (TID) | ORAL | Status: DC
  Administered 2012-09-21 – 2012-09-22 (×2): 100 mg via ORAL
  Filled 2012-09-21 (×4): qty 2

## 2012-09-21 MED ORDER — HEMOSTATIC AGENTS (NO CHARGE) OPTIME
TOPICAL | Status: DC | PRN
  Administered 2012-09-21: 1 via TOPICAL

## 2012-09-21 MED ORDER — GABAPENTIN 400 MG PO CAPS
800.0000 mg | ORAL_CAPSULE | Freq: Three times a day (TID) | ORAL | Status: DC
  Administered 2012-09-21 – 2012-09-22 (×2): 800 mg via ORAL
  Filled 2012-09-21 (×4): qty 2

## 2012-09-21 MED ORDER — OXYCODONE-ACETAMINOPHEN 5-325 MG PO TABS: 1.0000 | ORAL_TABLET | ORAL | Status: DC | PRN

## 2012-09-21 MED ORDER — LIDOCAINE HCL 4 % MT SOLN
OROMUCOSAL | Status: DC | PRN
  Administered 2012-09-21: 4 mL via TOPICAL

## 2012-09-21 MED ORDER — CEFAZOLIN SODIUM-DEXTROSE 2-3 GM-% IV SOLR
INTRAVENOUS | Status: AC
  Administered 2012-09-21: 2 g via INTRAVENOUS
  Filled 2012-09-21: qty 50

## 2012-09-21 MED ORDER — MORPHINE SULFATE 2 MG/ML IJ SOLN: 1.0000 mg | INTRAMUSCULAR | Status: DC | PRN

## 2012-09-21 MED ORDER — NEOSTIGMINE METHYLSULFATE 1 MG/ML IJ SOLN
INTRAMUSCULAR | Status: DC | PRN
  Administered 2012-09-21: 2 mg via INTRAVENOUS
  Administered 2012-09-21: 3 mg via INTRAVENOUS

## 2012-09-21 MED ORDER — PHENOL 1.4 % MT LIQD: 1.0000 | OROMUCOSAL | Status: DC | PRN

## 2012-09-21 MED ORDER — ONDANSETRON HCL 4 MG/2ML IJ SOLN: 4.0000 mg | INTRAMUSCULAR | Status: DC | PRN

## 2012-09-21 MED ORDER — MENTHOL 3 MG MT LOZG: 1.0000 | LOZENGE | OROMUCOSAL | Status: DC | PRN

## 2012-09-21 MED ORDER — LIDOCAINE HCL (CARDIAC) 20 MG/ML IV SOLN
INTRAVENOUS | Status: DC | PRN
  Administered 2012-09-21: 80 mg via INTRAVENOUS

## 2012-09-21 MED ORDER — KETOROLAC TROMETHAMINE 30 MG/ML IJ SOLN
30.0000 mg | Freq: Four times a day (QID) | INTRAMUSCULAR | Status: DC
  Administered 2012-09-21 – 2012-09-22 (×2): 30 mg via INTRAVENOUS
  Filled 2012-09-21 (×4): qty 1

## 2012-09-21 MED ORDER — OXYCODONE HCL 5 MG/5ML PO SOLN: 5.0000 mg | Freq: Once | ORAL | Status: DC | PRN

## 2012-09-21 MED ORDER — ZOLPIDEM TARTRATE 5 MG PO TABS: 5.0000 mg | ORAL_TABLET | Freq: Every evening | ORAL | Status: DC | PRN

## 2012-09-21 MED ORDER — OXYCODONE HCL 5 MG PO TABS: 5.0000 mg | ORAL_TABLET | Freq: Once | ORAL | Status: DC | PRN

## 2012-09-21 MED ORDER — FENTANYL CITRATE 0.05 MG/ML IJ SOLN
INTRAMUSCULAR | Status: DC | PRN
  Administered 2012-09-21 (×4): 50 ug via INTRAVENOUS
  Administered 2012-09-21 (×2): 100 ug via INTRAVENOUS

## 2012-09-21 MED ORDER — THROMBIN 5000 UNITS EX KIT
PACK | CUTANEOUS | Status: DC | PRN
  Administered 2012-09-21 (×3): 5000 [IU] via TOPICAL

## 2012-09-21 MED ORDER — SODIUM CHLORIDE 0.9 % IR SOLN
Status: DC | PRN
  Administered 2012-09-21: 15:00:00

## 2012-09-21 MED ORDER — CEFAZOLIN SODIUM 1-5 GM-% IV SOLN
1.0000 g | Freq: Three times a day (TID) | INTRAVENOUS | Status: DC
  Administered 2012-09-21 – 2012-09-22 (×2): 1 g via INTRAVENOUS
  Filled 2012-09-21 (×3): qty 50

## 2012-09-21 MED ORDER — LACTATED RINGERS IV SOLN
INTRAVENOUS | Status: DC | PRN
  Administered 2012-09-21 (×2): via INTRAVENOUS

## 2012-09-21 MED ORDER — PHENYLEPHRINE HCL 10 MG/ML IJ SOLN
10.0000 mg | INTRAVENOUS | Status: DC | PRN
  Administered 2012-09-21: 10 ug/min via INTRAVENOUS

## 2012-09-21 MED ORDER — TAPENTADOL HCL 100 MG PO TABS: 100.0000 mg | ORAL_TABLET | Freq: Three times a day (TID) | ORAL | Status: DC

## 2012-09-21 MED ORDER — HYDROMORPHONE HCL ER 8 MG PO T24A
8.0000 mg | EXTENDED_RELEASE_TABLET | ORAL | Status: DC
  Administered 2012-09-22: 8 mg via ORAL
  Filled 2012-09-21: qty 1

## 2012-09-21 SURGICAL SUPPLY — 60 items
APL SKNCLS STERI-STRIP NONHPOA (GAUZE/BANDAGES/DRESSINGS) ×1
BAG DECANTER FOR FLEXI CONT (MISCELLANEOUS) ×2 IMPLANT
BANDAGE GAUZE ELAST BULKY 4 IN (GAUZE/BANDAGES/DRESSINGS) ×4 IMPLANT
BENZOIN TINCTURE PRP APPL 2/3 (GAUZE/BANDAGES/DRESSINGS) ×2 IMPLANT
BIT DRILL 2.3 12 FIXED (INSTRUMENTS) IMPLANT
BONE CERV LORDOTIC 14.5X12X7 (Bone Implant) ×2 IMPLANT
BONE CERV LORDOTIC 14.5X12X8 (Bone Implant) ×2 IMPLANT
BUR MATCHSTICK NEURO 3.0 LAGG (BURR) ×2 IMPLANT
CANISTER SUCTION 2500CC (MISCELLANEOUS) ×2 IMPLANT
CLOTH BEACON ORANGE TIMEOUT ST (SAFETY) ×2 IMPLANT
CONT SPEC 4OZ CLIKSEAL STRL BL (MISCELLANEOUS) ×2 IMPLANT
DRAPE LAPAROTOMY 100X72 PEDS (DRAPES) ×2 IMPLANT
DRAPE MICROSCOPE LEICA (MISCELLANEOUS) ×2 IMPLANT
DRAPE POUCH INSTRU U-SHP 10X18 (DRAPES) ×2 IMPLANT
DRILL 12MM (INSTRUMENTS) ×2
DURAPREP 6ML APPLICATOR 50/CS (WOUND CARE) ×2 IMPLANT
ELECT REM PT RETURN 9FT ADLT (ELECTROSURGICAL) ×2
ELECTRODE REM PT RTRN 9FT ADLT (ELECTROSURGICAL) ×1 IMPLANT
GAUZE SPONGE 4X4 16PLY XRAY LF (GAUZE/BANDAGES/DRESSINGS) IMPLANT
GLOVE BIO SURGEON STRL SZ 6.5 (GLOVE) ×3 IMPLANT
GLOVE ECLIPSE 7.5 STRL STRAW (GLOVE) ×3 IMPLANT
GLOVE EXAM NITRILE LRG STRL (GLOVE) IMPLANT
GLOVE EXAM NITRILE MD LF STRL (GLOVE) IMPLANT
GLOVE EXAM NITRILE XL STR (GLOVE) IMPLANT
GLOVE EXAM NITRILE XS STR PU (GLOVE) IMPLANT
GLOVE INDICATOR 6.5 STRL GRN (GLOVE) ×1 IMPLANT
GLOVE INDICATOR 7.0 STRL GRN (GLOVE) ×1 IMPLANT
GLOVE INDICATOR 8.0 STRL GRN (GLOVE) ×1 IMPLANT
GLOVE SS BIOGEL STRL SZ 6.5 (GLOVE) IMPLANT
GLOVE SUPERSENSE BIOGEL SZ 6.5 (GLOVE) ×3
GOWN BRE IMP SLV AUR LG STRL (GOWN DISPOSABLE) ×2 IMPLANT
GOWN BRE IMP SLV AUR XL STRL (GOWN DISPOSABLE) ×3 IMPLANT
GOWN STRL REIN 2XL LVL4 (GOWN DISPOSABLE) IMPLANT
GRAFT BNE SPCR VG2 14.5X12X7 (Bone Implant) IMPLANT
GRAFT BNE SPCR VG2 14.5X12X8 (Bone Implant) IMPLANT
HEAD HALTER (SOFTGOODS) ×2 IMPLANT
KIT BASIN OR (CUSTOM PROCEDURE TRAY) ×2 IMPLANT
KIT ROOM TURNOVER OR (KITS) ×2 IMPLANT
NDL HYPO 25X1 1.5 SAFETY (NEEDLE) ×1 IMPLANT
NDL SPNL 20GX3.5 QUINCKE YW (NEEDLE) ×1 IMPLANT
NEEDLE HYPO 22GX1.5 SAFETY (NEEDLE) ×1 IMPLANT
NEEDLE HYPO 25X1 1.5 SAFETY (NEEDLE) ×2 IMPLANT
NEEDLE SPNL 20GX3.5 QUINCKE YW (NEEDLE) ×2 IMPLANT
NS IRRIG 1000ML POUR BTL (IV SOLUTION) ×2 IMPLANT
PACK LAMINECTOMY NEURO (CUSTOM PROCEDURE TRAY) ×2 IMPLANT
PAD ARMBOARD 7.5X6 YLW CONV (MISCELLANEOUS) ×6 IMPLANT
PATTIES SURGICAL .75X.75 (GAUZE/BANDAGES/DRESSINGS) ×2 IMPLANT
PIN DISTRACTION 14MM (PIN) ×4 IMPLANT
PLATE 16MM (Plate) ×2 IMPLANT
RUBBERBAND STERILE (MISCELLANEOUS) ×4 IMPLANT
SCREW 12MM (Screw) ×8 IMPLANT
SPONGE GAUZE 4X4 12PLY (GAUZE/BANDAGES/DRESSINGS) ×2 IMPLANT
SPONGE INTESTINAL PEANUT (DISPOSABLE) ×3 IMPLANT
STRIP CLOSURE SKIN 1/2X4 (GAUZE/BANDAGES/DRESSINGS) ×2 IMPLANT
SUT VIC AB 3-0 SH 8-18 (SUTURE) ×3 IMPLANT
SYR 20ML ECCENTRIC (SYRINGE) ×2 IMPLANT
TAPE CLOTH SURG 4X10 WHT LF (GAUZE/BANDAGES/DRESSINGS) ×1 IMPLANT
TOWEL OR 17X24 6PK STRL BLUE (TOWEL DISPOSABLE) ×2 IMPLANT
TOWEL OR 17X26 10 PK STRL BLUE (TOWEL DISPOSABLE) ×2 IMPLANT
WATER STERILE IRR 1000ML POUR (IV SOLUTION) ×2 IMPLANT

## 2012-09-21 NOTE — Interval H&P Note (Signed)
History and Physical Interval Note:  09/21/2012 1:27 PM  Becky Wolfe  has presented today for surgery, with the diagnosis of Cervical hnp with myelopathy, Cervical spondylosis with myelopathy, Cervical stenosis  The various methods of treatment have been discussed with the patient and family. After consideration of risks, benefits and other options for treatment, the patient has consented to  Procedure(s) (LRB) with comments: ANTERIOR CERVICAL DECOMPRESSION/DISCECTOMY FUSION 2 LEVELS (N/A) - C5-6 C7-T1 Anterior cervical decompression/diskectomy/fusion/Lifenet bone/trestle plate/Prior J1-9 Anterior cervical fusion/Removing trestle plate as a surgical intervention .  The patient's history has been reviewed, patient examined, no change in status, stable for surgery.  I have reviewed the patient's chart and labs.  Questions were answered to the patient's satisfaction.     [name] R

## 2012-09-21 NOTE — H&P (Signed)
See H& P.

## 2012-09-21 NOTE — Anesthesia Postprocedure Evaluation (Signed)
Anesthesia Post Note  Patient: Becky Wolfe  Procedure(s) Performed: Procedure(s) (LRB): ANTERIOR CERVICAL DECOMPRESSION/DISCECTOMY FUSION 2 LEVELS (N/A)  Anesthesia type: general  Patient location: PACU  Post pain: Pain level controlled  Post assessment: Patient's Cardiovascular Status Stable  Last Vitals:  Filed Vitals:   09/21/12 1855  BP: 157/78  Pulse: 101  Temp:   Resp: 16    Post vital signs: Reviewed and stable  Level of consciousness: sedated  Complications: No apparent anesthesia complications

## 2012-09-21 NOTE — Preoperative (Signed)
Beta Blockers   Reason not to administer Beta Blockers:Not Applicable 

## 2012-09-21 NOTE — Anesthesia Procedure Notes (Signed)
Procedure Name: Intubation Date/Time: 09/21/2012 1:43 PM Performed by: Tyrone Nine Pre-anesthesia Checklist: Patient identified, Timeout performed, Emergency Drugs available, Suction available and Patient being monitored Patient Re-evaluated:Patient Re-evaluated prior to inductionOxygen Delivery Method: Circle system utilized Preoxygenation: Pre-oxygenation with 100% oxygen Intubation Type: IV induction Ventilation: Oral airway inserted - appropriate to patient size Laryngoscope Size: Mac and 3 Grade View: Grade I Tube type: Oral Tube size: 7.5 mm Number of attempts: 1 Airway Equipment and Method: Stylet Placement Confirmation: ETT inserted through vocal cords under direct vision,  positive ETCO2,  CO2 detector and breath sounds checked- equal and bilateral Secured at: 22 cm Tube secured with: Tape Dental Injury: Teeth and Oropharynx as per pre-operative assessment

## 2012-09-21 NOTE — Transfer of Care (Signed)
Immediate Anesthesia Transfer of Care Note  Patient: Becky Wolfe  Procedure(s) Performed: Procedure(s) (LRB) with comments: ANTERIOR CERVICAL DECOMPRESSION/DISCECTOMY FUSION 2 LEVELS (N/A) - Cervical five-six, cervical seven-thoracic one Anterior cervical decompression/diskectomy/fusion/Lifenet bone/trestle plate/Prior Cervical six--seven Anterior cervical fusion/Removing trestle plate  Patient Location: PACU  Anesthesia Type:General  Level of Consciousness: awake, alert , oriented and patient cooperative  Airway & Oxygen Therapy: Patient Spontanous Breathing and Patient connected to nasal cannula oxygen  Post-op Assessment: Report given to PACU RN and Post -op Vital signs reviewed and stable  Post vital signs: Reviewed and stable  Complications: No apparent anesthesia complications

## 2012-09-21 NOTE — Op Note (Signed)
Copy - original titled h&p in computer - this cut and pasted under correct heading  09/21/2012  5:38 PM  PATIENT: Becky Wolfe 51 y.o. female  PRE-OPERATIVE DIAGNOSIS: Cervical herniated nucleus pulposus with myelopathy, Cervical spondylosis with myelopathy, Cervical stenosis  POST-OPERATIVE DIAGNOSIS: Cervical herniated nucleus pulposus with myelopathy, Cervical spondylosis with myelopathy, Cervical stenosis  PROCEDURE: Procedure(s):  ANTERIOR CERVICAL DECOMPRESSION/DISCECTOMY FUSION C5-6 and C7T1, lifenet bone, trestle plate(x2), removal plate  SURGEON: Surgeon(s):  Clydene Fake, MD  Hewitt Shorts, MD-assist  ANESTHESIA: general  EBL: Total I/O  In: 2650 [I.V.:2650]  Out: 75 [Blood:75]  BLOOD ADMINISTERED:none  DRAINS: none  SPECIMEN: No Specimen  DICTATION: Patient with neck and left arm pain numbness. Patient had prior ACF at [facility] for myelopathy she underwent MRI for current symptoms and has central stenosis at 56 and a left-sided disc herniation a very large at C7-T1 and patient brought in for 2 level ACF at these 2 levels at.  Patient brought in the operating room general anesthesia induced patient placed in 10 pounds of traction prepped draped sterile fashion 7 incision injected with 10 cc 1% lidocaine with epinephrine. Incision was then made centered previous scar in the anterior neck in the center to the slight incision taken of the platysma hemostasis obtained with Bovie cauterization platysma incised the Bovie and blunt dissection taken to the intracerebral fascia the intracerebral spine. Exposed the previous plate that was there and from the disc space above and below at. We removed the previous plate and replace soaking retractors suite and see the C7-T1 area placed a marker at that level and the 56 level took an x-ray cannot see the lower levels we did see the needle was at the 56 level confirming or positioning. Distraction pins were placed this C7-T1 interspace distracted  microscope was brought in for microdissection and discectomy started with pituitary rongeurs and curettes went to more Kerrison punches were used to remove posterior ligament of Treitz the disc and from the fragments of disc herniation remove this decompressed the central canal and bilateral nerve roots especially the left side were finished we did decompression. We is a high-speed drill to remove callus templated measured the space to be a 6 mm dissection or LifeNet allograft bone was tapped in place. A Tressel anterior cervical plate was placed with anterior cervical spine 2 screws placed this T1 to and screws were placed in the C7 these were tightened down. The distraction and discectomy pituitary rongeurs and curettes placed distraction pins distracted interspace and discectomy continued with one to more Kerrison punches removing the disc osteophyte and hypertrophic ligaments decompression the central canal and the foramen were open. High-speed drill was used to remove callus endplate measured height a displaced 35 hours 5-postop place. Placed Tressel anterior cervical plate over the spine and 2 screws placed in the see 5 to C6 these were tightened lateral x-rays attention good position of plate screws at C5 and just a posterior aspect the plug at 56 showing is in good position we could see the lower the. No intraoperative good positioning of classification bone grafts. About solution had very good hemostasis retractors removed the platysma closed through Vicryl interrupted sutures subcutaneous she closed the same skin closed benzoin Steri-Strips dressing was placed patient woken (and transferred recovery.was removed prior that. Removed the retractors of suite C5-6 level incise the disc space  PLAN OF CARE: Admit for overnight observation  PATIENT DISPOSITION: PACU - hemodynamically stable.

## 2012-09-21 NOTE — Progress Notes (Signed)
Office notified that orders need to be signed. Shanda Bumps states that she will take of it,

## 2012-09-21 NOTE — Anesthesia Preprocedure Evaluation (Addendum)
Anesthesia Evaluation  Patient identified by MRN, date of birth, ID band Patient awake    Reviewed: Allergy & Precautions, H&P , NPO status , Patient's Chart, lab work & pertinent test results  History of Anesthesia Complications (+) PONVNegative for: history of anesthetic complications  Airway Mallampati: I  Neck ROM: Full    Dental  (+) Teeth Intact and Dental Advisory Given   Pulmonary pneumonia -, resolved, Current Smoker,  breath sounds clear to auscultation  Pulmonary exam normal       Cardiovascular Exercise Tolerance: Poor hypertension, Pt. on medications + Valvular Problems/Murmurs Rhythm:Regular Rate:Normal  --------------------------------------------------------------------  Study Conclusions   - Left ventricle: Systolic function was normal. The estimated    ejection fraction was in the range of 55% to 65%. Doppler    parameters are consistent with abnormal left ventricular    relaxation (grade 1 diastolic dysfunction).  - Aortic valve: Moderate regurgitation.  - Mitral valve: Calcified annulus. Mild to moderate regurgitation.  Transthoracic echocardiography. M-mode, complete 2D, spectral  Doppler, and color Doppler. Height: Height: 160cm. Height: 63in.  Weight: Weight: 65.8kg. Weight: 144.7lb. Body mass index: BMI:  25.7kg/m^2. Body surface area: BSA: 1.29m^2. Blood pressure: 116/76.  Patient status: Inpatient. Location: Echo laboratory. 07-31-10   Neuro/Psych  Headaches, Anxiety TIA Neuromuscular disease    GI/Hepatic negative GI ROS, (+) Hepatitis -, C  Endo/Other  negative endocrine ROS  Renal/GU negative Renal ROS     Musculoskeletal   Abdominal   Peds  Hematology negative hematology ROS (+)   Anesthesia Other Findings RSD of lower extremity.  Chronic Pain Management PT.  Dr Roderic Ovens.  Reproductive/Obstetrics                      Anesthesia Physical Anesthesia Plan  ASA:  II  Anesthesia Plan: General   Post-op Pain Management:    Induction: Intravenous  Airway Management Planned: Oral ETT  Additional Equipment:   Intra-op Plan:   Post-operative Plan: Extubation in OR  Informed Consent: I have reviewed the patients History and Physical, chart, labs and discussed the procedure including the risks, benefits and alternatives for the proposed anesthesia with the patient or authorized representative who has indicated his/her understanding and acceptance.   Dental advisory given  Plan Discussed with: CRNA and Surgeon  Anesthesia Plan Comments:         Anesthesia Quick Evaluation

## 2012-09-21 NOTE — H&P (Signed)
09/21/2012  5:38 PM  PATIENT:  Becky Wolfe  51 y.o. female  PRE-OPERATIVE DIAGNOSIS:  Cervical herniated nucleus pulposus with myelopathy, Cervical spondylosis with myelopathy, Cervical stenosis  POST-OPERATIVE DIAGNOSIS:  Cervical herniated nucleus pulposus with myelopathy, Cervical spondylosis with myelopathy, Cervical stenosis  PROCEDURE:  Procedure(s): ANTERIOR CERVICAL DECOMPRESSION/DISCECTOMY FUSION C5-6 and C7T1, lifenet bone, trestle plate(x2), removal plate  SURGEON:  Surgeon(s): Clydene Fake, MD Hewitt Shorts, MD-assist    ANESTHESIA:   general  EBL:  Total I/O In: 2650 [I.V.:2650] Out: 75 [Blood:75]  BLOOD ADMINISTERED:none  DRAINS: none   SPECIMEN:  No Specimen  DICTATION: Patient with neck and left arm pain numbness. Patient had prior ACF at [facility] for myelopathy she underwent MRI for current symptoms and has central stenosis at 56 and a left-sided disc herniation a very large at C7-T1 and patient brought in for 2 level ACF at these 2 levels at.  Patient brought in the operating room general anesthesia induced patient placed in 10 pounds of traction prepped draped sterile fashion 7 incision injected with 10 cc 1% lidocaine with epinephrine. Incision was then made centered previous scar in the anterior neck in the center to the slight incision taken of the platysma hemostasis obtained with Bovie cauterization platysma incised the Bovie and blunt dissection taken to the intracerebral fascia the intracerebral spine. Exposed the previous plate that was there and from the disc space above and below at. We removed the previous plate and replace soaking retractors suite and see the C7-T1 area placed a marker at that level and the 56 level took an x-ray cannot see the lower levels we did see the needle was at the 56 level confirming or positioning. Distraction pins were placed this C7-T1 interspace distracted microscope was brought in for microdissection and discectomy  started with pituitary rongeurs and curettes went to more Kerrison punches were used to remove posterior ligament of Treitz the disc and from the fragments of disc herniation remove this decompressed the central canal and bilateral nerve roots especially the left side were finished we did decompression. We is a high-speed drill to remove callus templated measured the space to be a 6 mm dissection or LifeNet allograft bone was tapped in place. A Tressel anterior cervical plate was placed with anterior cervical spine 2 screws placed this T1 to and screws were placed in the C7 these were tightened down. The distraction and discectomy pituitary rongeurs and curettes placed distraction pins distracted interspace and discectomy continued with one to more Kerrison punches removing the disc osteophyte and hypertrophic ligaments decompression the central canal and the foramen were open. High-speed drill was used to remove callus endplate measured height a displaced 35 hours 5-postop place. Placed Tressel anterior cervical plate over the spine and 2 screws placed in the see 5 to C6 these were tightened lateral x-rays attention good position of plate screws at C5 and just a posterior aspect the plug at 56 showing is in good position we could see the lower the. No intraoperative good positioning of classification bone grafts. About solution had very good hemostasis retractors removed the platysma closed through Vicryl interrupted sutures subcutaneous she closed the same skin closed benzoin Steri-Strips dressing was placed patient woken (and transferred recovery.was removed prior that. Removed the retractors of suite C5-6 level incise the disc space  PLAN OF CARE: Admit for overnight observation  PATIENT DISPOSITION:  PACU - hemodynamically stable.

## 2012-09-22 ENCOUNTER — Encounter (HOSPITAL_COMMUNITY): Payer: Self-pay | Admitting: Neurosurgery

## 2012-09-22 MED ORDER — PNEUMOCOCCAL VAC POLYVALENT 25 MCG/0.5ML IJ INJ
0.5000 mL | INJECTION | INTRAMUSCULAR | Status: DC
  Filled 2012-09-22: qty 0.5

## 2012-09-22 MED ORDER — OXYCODONE-ACETAMINOPHEN 5-325 MG PO TABS: 1.0000 | ORAL_TABLET | ORAL | Status: DC | PRN

## 2012-09-22 NOTE — Discharge Summary (Signed)
Physician Discharge Summary  Patient ID: Becky Wolfe MRN: [id number] DOB/AGE: September 14, 1961 51 y.o.  Admit date: 09/21/2012 Discharge date: 09/22/2012  Admission Diagnoses:Cervical herniated nucleus pulposus with myelopathy, Cervical spondylosis with myelopathy, Cervical stenosis   Discharge Diagnoses: Cervical herniated nucleus pulposus with myelopathy, Cervical spondylosis with myelopathy, Cervical stenosis  Active Problems:  * No active hospital problems. *    Discharged Condition: good  Hospital Course: pt admitted day of surgery  - underwent procedure below - pt doing well, less arm pain, eating, voiding, moves all 4 well  Consults: None  Significant Diagnostic Studies: none  Treatments: surgery: ANTERIOR CERVICAL DECOMPRESSION/DISCECTOMY FUSION C5-6 and C7T1, lifenet bone, trestle plate(x2), removal plate   Discharge Exam: Blood pressure 153/73, pulse 84, temperature 98.6 F (37 C), temperature source Oral, resp. rate 18, height 5\' 2"  (1.575 m), weight 72.576 kg (160 lb), last menstrual period 10/16/2011, SpO2 92.00%. Wound:c/d/i  Disposition: home     Medication List     As of 09/22/2012  8:50 AM    TAKE these medications         aspirin 81 MG tablet   Take 81 mg by mouth daily.      clonazePAM 0.5 MG tablet   Commonly known as: KLONOPIN   Take 1-2 mg by mouth 2 (two) times daily as needed. For anxiety.      cloNIDine 0.1 MG tablet   Commonly known as: CATAPRES   Take 0.1 mg by mouth every 8 (eight) hours as needed. For anxiety.      cyclobenzaprine 10 MG tablet   Commonly known as: FLEXERIL   Take 10-20 mg by mouth at bedtime.      gabapentin 800 MG tablet   Commonly known as: NEURONTIN   Take 800 mg by mouth 3 (three) times daily.      HYDROmorphone HCl 8 MG T24a   Commonly known as: EXALGO   Take by mouth daily.      oxyCODONE-acetaminophen 5-325 MG per tablet   Commonly known as: PERCOCET/ROXICET   Take 1-2 tablets by mouth every 4 (four)  hours as needed for pain.      Tapentadol HCl 100 MG Tabs   Take 100 mg by mouth 3 (three) times daily.         SignedClydene Fake, MD 09/22/2012, 8:50 AM

## 2012-09-22 NOTE — Progress Notes (Signed)
Utilization review completed. Aara Jacquot, RN, BSN. 

## 2012-10-19 DIAGNOSIS — G379 Demyelinating disease of central nervous system, unspecified: Secondary | ICD-10-CM | POA: Insufficient documentation

## 2012-11-19 ENCOUNTER — Emergency Department (HOSPITAL_BASED_OUTPATIENT_CLINIC_OR_DEPARTMENT_OTHER)
Admission: EM | Admit: 2012-11-19 | Discharge: 2012-11-19 | Disposition: A | Discharge status: Home / Self Care | Payer: 59 | Attending: Emergency Medicine | Admitting: Emergency Medicine

## 2012-11-19 ENCOUNTER — Encounter (HOSPITAL_BASED_OUTPATIENT_CLINIC_OR_DEPARTMENT_OTHER): Payer: Self-pay

## 2012-11-19 ENCOUNTER — Emergency Department (HOSPITAL_BASED_OUTPATIENT_CLINIC_OR_DEPARTMENT_OTHER): Payer: 59

## 2012-11-19 DIAGNOSIS — X58XXXA Exposure to other specified factors, initial encounter: Secondary | ICD-10-CM | POA: Insufficient documentation

## 2012-11-19 DIAGNOSIS — IMO0002 Reserved for concepts with insufficient information to code with codable children: Secondary | ICD-10-CM | POA: Insufficient documentation

## 2012-11-19 DIAGNOSIS — Z8619 Personal history of other infectious and parasitic diseases: Secondary | ICD-10-CM | POA: Insufficient documentation

## 2012-11-19 DIAGNOSIS — G8929 Other chronic pain: Secondary | ICD-10-CM | POA: Insufficient documentation

## 2012-11-19 DIAGNOSIS — Z8673 Personal history of transient ischemic attack (TIA), and cerebral infarction without residual deficits: Secondary | ICD-10-CM | POA: Insufficient documentation

## 2012-11-19 DIAGNOSIS — Z8679 Personal history of other diseases of the circulatory system: Secondary | ICD-10-CM | POA: Insufficient documentation

## 2012-11-19 DIAGNOSIS — Z8701 Personal history of pneumonia (recurrent): Secondary | ICD-10-CM | POA: Insufficient documentation

## 2012-11-19 DIAGNOSIS — G35 Multiple sclerosis: Secondary | ICD-10-CM | POA: Insufficient documentation

## 2012-11-19 DIAGNOSIS — S0993XA Unspecified injury of face, initial encounter: Secondary | ICD-10-CM | POA: Insufficient documentation

## 2012-11-19 DIAGNOSIS — G501 Atypical facial pain: Secondary | ICD-10-CM | POA: Insufficient documentation

## 2012-11-19 DIAGNOSIS — Y939 Activity, unspecified: Secondary | ICD-10-CM | POA: Insufficient documentation

## 2012-11-19 DIAGNOSIS — Z8709 Personal history of other diseases of the respiratory system: Secondary | ICD-10-CM | POA: Insufficient documentation

## 2012-11-19 DIAGNOSIS — Z7982 Long term (current) use of aspirin: Secondary | ICD-10-CM | POA: Insufficient documentation

## 2012-11-19 DIAGNOSIS — S0990XA Unspecified injury of head, initial encounter: Secondary | ICD-10-CM

## 2012-11-19 DIAGNOSIS — G9050 Complex regional pain syndrome I, unspecified: Secondary | ICD-10-CM | POA: Insufficient documentation

## 2012-11-19 DIAGNOSIS — M542 Cervicalgia: Secondary | ICD-10-CM | POA: Insufficient documentation

## 2012-11-19 DIAGNOSIS — F411 Generalized anxiety disorder: Secondary | ICD-10-CM | POA: Insufficient documentation

## 2012-11-19 DIAGNOSIS — Y92009 Unspecified place in unspecified non-institutional (private) residence as the place of occurrence of the external cause: Secondary | ICD-10-CM | POA: Insufficient documentation

## 2012-11-19 DIAGNOSIS — R51 Headache: Secondary | ICD-10-CM | POA: Insufficient documentation

## 2012-11-19 DIAGNOSIS — Z79899 Other long term (current) drug therapy: Secondary | ICD-10-CM | POA: Insufficient documentation

## 2012-11-19 DIAGNOSIS — S199XXA Unspecified injury of neck, initial encounter: Secondary | ICD-10-CM | POA: Insufficient documentation

## 2012-11-19 DIAGNOSIS — F17200 Nicotine dependence, unspecified, uncomplicated: Secondary | ICD-10-CM | POA: Insufficient documentation

## 2012-11-19 DIAGNOSIS — R259 Unspecified abnormal involuntary movements: Secondary | ICD-10-CM | POA: Insufficient documentation

## 2012-11-19 LAB — CBC WITH DIFFERENTIAL/PLATELET
Basophils Absolute: 0 10*3/uL (ref 0.0–0.1)
Eosinophils Absolute: 0.1 10*3/uL (ref 0.0–0.7)
Eosinophils Relative: 2 % (ref 0–5)
Lymphocytes Relative: 49 % — ABNORMAL HIGH (ref 12–46)
MCH: 31.4 pg (ref 26.0–34.0)
MCV: 86.9 fL (ref 78.0–100.0)
Neutrophils Relative %: 40 % — ABNORMAL LOW (ref 43–77)
Platelets: 159 10*3/uL (ref 150–400)
RDW: 12.1 % (ref 11.5–15.5)
WBC: 4.8 10*3/uL (ref 4.0–10.5)

## 2012-11-19 LAB — BASIC METABOLIC PANEL
Calcium: 9.1 mg/dL (ref 8.4–10.5)
GFR calc non Af Amer: >90 mL/min (ref >90)
Potassium: 3.6 mEq/L (ref 3.5–5.1)
Sodium: 139 mEq/L (ref 135–145)

## 2012-11-19 NOTE — ED Notes (Addendum)
Pt states she was found passed out by husband approx 530am in laundry room-pt last recall is walking to laundry room with coffee-pt c/o pain to forehead, left side of face and right eye

## 2012-11-19 NOTE — ED Provider Notes (Signed)
History     CSN: [id number]  Arrival date & time 11/19/12  2017   First MD Initiated Contact with Patient 11/19/12 2047      Chief Complaint  Patient presents with  . Loss of Consciousness    HPI Pt states she was folding laundry at 0530 this am.  She remembers spilling coffee on her gown.  She is not sure if she spilled something on the floor.  The next thing she knows is her husband found her on the floor.   Pt had a small abrasion to her head.   She initially did not think too much of it.  Through the day she has had some pain in her head and face.  She has had some twitching of her left eye. Pt did have an episode similar to this a few months ago.  She was told that she was unconscious for 45 minutes.  She was told she had a concussion at that time.  Past Medical History  Diagnosis Date  . Multiple sclerosis   . RSD (reflex sympathetic dystrophy)   . Hepatitis C   . Heart murmur     Dr. Dione Housekeeper  . Anxiety   . Pneumonia     hx of  . Bronchitis     hx of  . TIA (transient ischemic attack)     hx of  . Headache     migraines  . Chronic neck pain     Past Surgical History  Procedure Date  . Neck surgery   . Cesarean section   . Abdominal hysterectomy   . Appendectomy   . Wedge resection     left ovary with cyst removed   . Anterior cervical decomp/discectomy fusion 09/21/2012    Procedure: ANTERIOR CERVICAL DECOMPRESSION/DISCECTOMY FUSION 2 LEVELS;  Surgeon: Clydene Fake, MD;  Location: [facility];  Service: Neurosurgery;  Laterality: N/A;  Cervical five-six, cervical seven-thoracic one Anterior cervical decompression/diskectomy/fusion/Lifenet bone/trestle plate/Prior Cervical six--seven Anterior cervical fusion/Removing trestle plate    No family history on file.  History  Substance Use Topics  . Smoking status: Current Every Day Smoker -- 0.2 packs/day for 20 years  . Smokeless tobacco: Not on file  . Alcohol Use: No    OB History    Grav Para Term  Preterm Abortions TAB SAB Ect Mult Living                  Review of Systems  All other systems reviewed and are negative.    Allergies  Lodine  Home Medications   Current Outpatient Rx  Name  Route  Sig  Dispense  Refill  . ASPIRIN 81 MG PO TABS   Oral   Take 81 mg by mouth daily.         Marland Kitchen CLONAZEPAM 0.5 MG PO TABS   Oral   Take 1-2 mg by mouth 2 (two) times daily as needed. For anxiety.         Marland Kitchen CLONIDINE HCL 0.1 MG PO TABS   Oral   Take 0.1 mg by mouth every 8 (eight) hours as needed. For anxiety.         . CYCLOBENZAPRINE HCL 10 MG PO TABS   Oral   Take 10-20 mg by mouth at bedtime.          Marland Kitchen GABAPENTIN 800 MG PO TABS   Oral   Take 800 mg by mouth 3 (three) times daily.         Marland Kitchen  HYDROMORPHONE HCL ER 8 MG PO T24A   Oral   Take by mouth daily.         . OXYCODONE-ACETAMINOPHEN 5-325 MG PO TABS   Oral   Take 1-2 tablets by mouth every 4 (four) hours as needed for pain.   51 tablet   0   . TAPENTADOL HCL 100 MG PO TABS   Oral   Take 100 mg by mouth 3 (three) times daily.           BP 141/78  Pulse 86  Temp 98.3 F (36.8 C) (Oral)  Resp 16  Ht 5\' 2"  (1.575 m)  Wt 150 lb (68.04 kg)  BMI 27.44 kg/m2  SpO2 99%  LMP 10/16/2011  Physical Exam  Nursing note and vitals reviewed. Constitutional: No distress.  HENT:  Head: Normocephalic.  Right Ear: External ear normal.  Left Ear: External ear normal.       Small abrasion left forehead  Eyes: Conjunctivae normal are normal. Right eye exhibits no discharge. Left eye exhibits no discharge. No scleral icterus.  Neck: Neck supple. No tracheal deviation present.  Cardiovascular: Normal rate, regular rhythm and intact distal pulses.   Pulmonary/Chest: Effort normal and breath sounds normal. No stridor. No respiratory distress. She has no wheezes. She has no rales.  Abdominal: Soft. Bowel sounds are normal. She exhibits no distension. There is no tenderness. There is no rebound and no  guarding.  Musculoskeletal: She exhibits tenderness. She exhibits no edema.       Cervical back: She exhibits tenderness.       Lumbar back: She exhibits tenderness.       No deformity right lower extremity, patient does have tenderness to palpation but she states this is associated with her reflex sympathetic dystrophy  Neurological: She is alert. She is not disoriented. No sensory deficit. Cranial nerve deficit:  no gross defecits noted. She exhibits normal muscle tone. She displays no seizure activity. Coordination normal. GCS eye subscore is 4. GCS verbal subscore is 5. GCS motor subscore is 6.  Skin: Skin is warm and dry. No rash noted. She is not diaphoretic.  Psychiatric: She has a normal mood and affect.    ED Course  Procedures (including critical care time)  Rate: 78  Rhythm: normal sinus rhythm  QRS Axis: normal  Intervals: normal  ST/T Wave abnormalities: normal  Conduction Disutrbances:none  Narrative Interpretation: Normal  Old EKG Reviewed: No significant change since last tracing  Labs Reviewed  CBC WITH DIFFERENTIAL - Abnormal; Notable for the following:    MCHC 36.1 (*)     Neutrophils Relative 40 (*)     Lymphocytes Relative 49 (*)     All other components within normal limits  BASIC METABOLIC PANEL - Abnormal; Notable for the following:    Glucose, Bld 116 (*)     All other components within normal limits   Dg Lumbar Spine Complete  11/19/2012  *RADIOLOGY REPORT*  Clinical Data: Loss of consciousness.  Back pain.  LUMBAR SPINE - COMPLETE 4+ VIEW  Comparison: CT 06/24/2012.  Findings: There is a mild dextroconvex curvature of the thoracolumbar spine with the apex at about T11.  Lower lumbar facet arthrosis is present.  This is greater on the right than left.  There is no fracture.  No pars defects. Abdominal aortic atherosclerosis is present.  IMPRESSION: No acute osseous abnormality.  Mild curvature of the thoracolumbar spine may be positional or secondary to spasm.   Right greater than left  L5-S1 facet arthrosis.   Original Report Authenticated By: Andreas Newport, M.D.    Ct Head Wo Contrast  11/19/2012  *RADIOLOGY REPORT*  Clinical Data:  Syncope.  Headache.  Recent neck surgery.  History of multiple sclerosis.  CT HEAD WITHOUT CONTRAST CT CERVICAL SPINE WITHOUT CONTRAST  Technique:  Multidetector CT imaging of the head and cervical spine was performed following the standard protocol without intravenous contrast.  Multiplanar CT image reconstructions of the cervical spine were also generated.  Comparison:  MRI 09/02/2012.  CT HEAD  Findings: Diffuse white matter disease is present, visualized on prior MRI.  No acute intracranial abnormality.  No mass lesion, mass effect, midline shift, hydrocephalus, or hemorrhage.  Calvarium intact. Sphenoid sinus mucous retention cyst or polyp.  IMPRESSION: No acute intracranial abnormality.  Diffuse white matter disease compatible with history multiple sclerosis.  CT CERVICAL SPINE  Findings: 5 mm left apical pulmonary nodule is present.   This nodule will require follow-up.  The alignment is within normal limits allowing for fusion. C5-T1 ACDF is present.  There is prominent posterior osseous ridging at C6 with mild residual central stenosis.  Findings suggestive of partially resected posterior longitudinal ligament ossification.  There is no cervical spine fracture or dislocation.  No hardware complication.  Solid fusion is present at C6-C7 with incorporation of the interbody bone graft.  This fusion likely predates the C5-C6 and C7-T1 fusion.  IMPRESSION: 1.  No acute osseous abnormality.  C5-T1 ACDF.  No complication. 2.  5 mm anterior left apical pulmonary nodule. If the patient is at high risk for bronchogenic carcinoma, follow-up chest CT at 6-12 months is recommended.  If the patient is at low risk for bronchogenic carcinoma, follow-up chest CT at 12 months is recommended.  This recommendation follows the consensus statement:  Guidelines for Management of Small Pulmonary Nodules Detected on CT Scans: A Statement from the Fleischner Society as published in Radiology 2005; 237:395-400.   Original Report Authenticated By: Andreas Newport, M.D.    Ct Cervical Spine Wo Contrast  11/19/2012  *RADIOLOGY REPORT*  Clinical Data:  Syncope.  Headache.  Recent neck surgery.  History of multiple sclerosis.  CT HEAD WITHOUT CONTRAST CT CERVICAL SPINE WITHOUT CONTRAST  Technique:  Multidetector CT imaging of the head and cervical spine was performed following the standard protocol without intravenous contrast.  Multiplanar CT image reconstructions of the cervical spine were also generated.  Comparison:  MRI 09/02/2012.  CT HEAD  Findings: Diffuse white matter disease is present, visualized on prior MRI.  No acute intracranial abnormality.  No mass lesion, mass effect, midline shift, hydrocephalus, or hemorrhage.  Calvarium intact. Sphenoid sinus mucous retention cyst or polyp.  IMPRESSION: No acute intracranial abnormality.  Diffuse white matter disease compatible with history multiple sclerosis.  CT CERVICAL SPINE  Findings: 5 mm left apical pulmonary nodule is present.   This nodule will require follow-up.  The alignment is within normal limits allowing for fusion. C5-T1 ACDF is present.  There is prominent posterior osseous ridging at C6 with mild residual central stenosis.  Findings suggestive of partially resected posterior longitudinal ligament ossification.  There is no cervical spine fracture or dislocation.  No hardware complication.  Solid fusion is present at C6-C7 with incorporation of the interbody bone graft.  This fusion likely predates the C5-C6 and C7-T1 fusion.  IMPRESSION: 1.  No acute osseous abnormality.  C5-T1 ACDF.  No complication. 2.  5 mm anterior left apical pulmonary nodule. If the patient is at high  risk for bronchogenic carcinoma, follow-up chest CT at 6-12 months is recommended.  If the patient is at low risk for  bronchogenic carcinoma, follow-up chest CT at 12 months is recommended.  This recommendation follows the consensus statement: Guidelines for Management of Small Pulmonary Nodules Detected on CT Scans: A Statement from the Fleischner Society as published in Radiology 2005; 237:395-400.   Original Report Authenticated By: Andreas Newport, M.D.     1. Minor head injury     MDM  The patient had a fall this morning. Unclear if she had a syncopal episode or she slipped and fell sustaining a mild concussion.  The patient does have multiple medical problems including MS and reflex sympathetic dystrophy. She is on chronic pain medications and it is possible that could have affected her coordination causing her to slip and fall. At this time there doesn't appear to be any evidence of serious injury. I have low suspicion for TIA or life-threatening cardiac dysrhythmia. The patient followup with her primary Dr.  Regarding her fall and injuries there does not appear to be evidence of any serious injury.       Celene Kras, MD 11/19/12 2215

## 2012-11-19 NOTE — ED Notes (Signed)
Patient states that her husband found her in the laundry room this am.  Patient remembers spilling tea on her gown and went to the laundry room to change her clothes.  Patient has bruising to her left forehead.  C/o right side neck pain, headache (mostly on the left side), slight blurred vision.  Patient has history of MS.  She takes pain medication for her neck (recently had neck surgery) and also for her restless leg syndrome.  Patient denies any nausea or vomiting.  Alert and oriented x 3.

## 2013-02-03 ENCOUNTER — Emergency Department (HOSPITAL_BASED_OUTPATIENT_CLINIC_OR_DEPARTMENT_OTHER)
Admission: EM | Admit: 2013-02-03 | Discharge: 2013-02-03 | Disposition: A | Discharge status: Home / Self Care | Payer: 59 | Attending: Emergency Medicine | Admitting: Emergency Medicine

## 2013-02-03 ENCOUNTER — Encounter (HOSPITAL_BASED_OUTPATIENT_CLINIC_OR_DEPARTMENT_OTHER): Payer: Self-pay | Admitting: *Deleted

## 2013-02-03 ENCOUNTER — Emergency Department (HOSPITAL_BASED_OUTPATIENT_CLINIC_OR_DEPARTMENT_OTHER): Payer: 59

## 2013-02-03 DIAGNOSIS — F411 Generalized anxiety disorder: Secondary | ICD-10-CM | POA: Insufficient documentation

## 2013-02-03 DIAGNOSIS — R011 Cardiac murmur, unspecified: Secondary | ICD-10-CM | POA: Insufficient documentation

## 2013-02-03 DIAGNOSIS — Z8679 Personal history of other diseases of the circulatory system: Secondary | ICD-10-CM | POA: Insufficient documentation

## 2013-02-03 DIAGNOSIS — G9050 Complex regional pain syndrome I, unspecified: Secondary | ICD-10-CM | POA: Insufficient documentation

## 2013-02-03 DIAGNOSIS — Z8701 Personal history of pneumonia (recurrent): Secondary | ICD-10-CM | POA: Insufficient documentation

## 2013-02-03 DIAGNOSIS — Z8673 Personal history of transient ischemic attack (TIA), and cerebral infarction without residual deficits: Secondary | ICD-10-CM | POA: Insufficient documentation

## 2013-02-03 DIAGNOSIS — F17200 Nicotine dependence, unspecified, uncomplicated: Secondary | ICD-10-CM | POA: Insufficient documentation

## 2013-02-03 DIAGNOSIS — Z79899 Other long term (current) drug therapy: Secondary | ICD-10-CM | POA: Insufficient documentation

## 2013-02-03 DIAGNOSIS — Z8739 Personal history of other diseases of the musculoskeletal system and connective tissue: Secondary | ICD-10-CM | POA: Insufficient documentation

## 2013-02-03 DIAGNOSIS — Z981 Arthrodesis status: Secondary | ICD-10-CM | POA: Insufficient documentation

## 2013-02-03 DIAGNOSIS — Z8619 Personal history of other infectious and parasitic diseases: Secondary | ICD-10-CM | POA: Insufficient documentation

## 2013-02-03 DIAGNOSIS — Z7982 Long term (current) use of aspirin: Secondary | ICD-10-CM | POA: Insufficient documentation

## 2013-02-03 DIAGNOSIS — I82451 Acute embolism and thrombosis of right peroneal vein: Secondary | ICD-10-CM

## 2013-02-03 DIAGNOSIS — I824Z9 Acute embolism and thrombosis of unspecified deep veins of unspecified distal lower extremity: Secondary | ICD-10-CM | POA: Insufficient documentation

## 2013-02-03 HISTORY — DX: Encounter for other specified aftercare: Z51.89

## 2013-02-03 LAB — BASIC METABOLIC PANEL
GFR calc Af Amer: >90 mL/min (ref >90)
GFR calc non Af Amer: 83 mL/min — ABNORMAL LOW (ref >90)
Potassium: 3.9 mEq/L (ref 3.5–5.1)
Sodium: 141 mEq/L (ref 135–145)

## 2013-02-03 MED ORDER — HYDROMORPHONE HCL PF 1 MG/ML IJ SOLN
1.0000 mg | Freq: Once | INTRAMUSCULAR | Status: AC
  Administered 2013-02-03: 1 mg via INTRAMUSCULAR

## 2013-02-03 MED ORDER — RIVAROXABAN 20 MG PO TABS: 20.0000 mg | ORAL_TABLET | Freq: Every day | ORAL | Status: DC

## 2013-02-03 MED ORDER — HYDROMORPHONE HCL PF 1 MG/ML IJ SOLN
INTRAMUSCULAR | Status: AC
  Administered 2013-02-03: 1 mg via INTRAMUSCULAR
  Filled 2013-02-03: qty 1

## 2013-02-03 MED ORDER — HYDROMORPHONE HCL PF 1 MG/ML IJ SOLN
1.0000 mg | Freq: Once | INTRAMUSCULAR | Status: AC
  Administered 2013-02-03: 1 mg via INTRAMUSCULAR
  Filled 2013-02-03: qty 1

## 2013-02-03 NOTE — ED Notes (Signed)
Pt c/o pain in right leg- states she feels dizzy- has hx of RSD

## 2013-02-03 NOTE — ED Provider Notes (Signed)
History     CSN: [id number]  Arrival date & time 02/03/13  1614   First MD Initiated Contact with Patient 02/03/13 1632      Chief Complaint  Patient presents with  . Leg Pain    (Consider location/radiation/quality/duration/timing/severity/associated sxs/prior treatment) HPI Comments: Pt states that she has rsd in her right leg:pt states that her pain medication is not working:pt states that she has history of clot in the leg and she thinks that she may have one  Patient is a 52 y.o. female presenting with leg pain. The history is provided by the patient. No language interpreter was used.  Leg Pain Location:  Leg Injury: no   Leg location:  R leg Pain details:    Quality:  Aching   Radiates to:  Does not radiate   Severity:  Moderate   Timing:  Constant   Progression:  Unchanged Foreign body present:  Unable to specify Ineffective treatments:  None tried Associated symptoms: no numbness     Past Medical History  Diagnosis Date  . Multiple sclerosis   . RSD (reflex sympathetic dystrophy)   . Hepatitis C   . Heart murmur     Dr. Dione Housekeeper  . Anxiety   . Pneumonia     hx of  . Bronchitis     hx of  . TIA (transient ischemic attack)     hx of  . Headache     migraines  . Chronic neck pain   . Blood transfusion without reported diagnosis     Past Surgical History  Procedure Laterality Date  . Neck surgery    . Cesarean section    . Abdominal hysterectomy    . Appendectomy    . Wedge resection      left ovary with cyst removed   . Anterior cervical decomp/discectomy fusion  09/21/2012    Procedure: ANTERIOR CERVICAL DECOMPRESSION/DISCECTOMY FUSION 2 LEVELS;  Surgeon: Clydene Fake, MD;  Location: [facility];  Service: Neurosurgery;  Laterality: N/A;  Cervical five-six, cervical seven-thoracic one Anterior cervical decompression/diskectomy/fusion/Lifenet bone/trestle plate/Prior Cervical six--seven Anterior cervical fusion/Removing trestle plate  . Ankle  surgery      No family history on file.  History  Substance Use Topics  . Smoking status: Current Every Day Smoker -- 0.25 packs/day for 20 years  . Smokeless tobacco: Never Used  . Alcohol Use: No    OB History   Grav Para Term Preterm Abortions TAB SAB Ect Mult Living                  Review of Systems  Constitutional: Negative.   Respiratory: Negative.   Cardiovascular: Negative.     Allergies  Lodine  Home Medications   Current Outpatient Rx  Name  Route  Sig  Dispense  Refill  . aspirin 81 MG tablet   Oral   Take 81 mg by mouth daily.         . clonazePAM (KLONOPIN) 0.5 MG tablet   Oral   Take 1-2 mg by mouth 2 (two) times daily as needed. For anxiety.         . cloNIDine (CATAPRES) 0.1 MG tablet   Oral   Take 0.1 mg by mouth every 8 (eight) hours as needed. For anxiety.         . cyclobenzaprine (FLEXERIL) 10 MG tablet   Oral   Take 10-20 mg by mouth at bedtime.          Marland Kitchen  gabapentin (NEURONTIN) 800 MG tablet   Oral   Take 800 mg by mouth 3 (three) times daily.         Marland Kitchen HYDROmorphone HCl (EXALGO) 8 MG T24A   Oral   Take by mouth daily.         . Tapentadol HCl 100 MG TABS   Oral   Take 100 mg by mouth 3 (three) times daily.         Marland Kitchen oxyCODONE-acetaminophen (PERCOCET/ROXICET) 5-325 MG per tablet   Oral   Take 1-2 tablets by mouth every 4 (four) hours as needed for pain.   51 tablet   0     BP 120/75  Pulse 82  Temp(Src) 98.2 F (36.8 C) (Oral)  Resp 18  Ht 5\' 2"  (1.575 m)  Wt 150 lb (68.04 kg)  BMI 27.43 kg/m2  SpO2 100%  LMP 10/16/2011  Physical Exam  Nursing note and vitals reviewed. Constitutional: She is oriented to person, place, and time. She appears well-developed and well-nourished.  HENT:  Head: Normocephalic and atraumatic.  Cardiovascular: Normal rate and regular rhythm.   Pulmonary/Chest: Effort normal and breath sounds normal.  Abdominal: Soft. Bowel sounds are normal.  Musculoskeletal: Normal  range of motion.  No gross or deformity or swelling noted:pt has full rom  Neurological: She is alert and oriented to person, place, and time.  Skin: Skin is warm and dry.    ED Course  Procedures (including critical care time)  Labs Reviewed - No data to display US Venous Img Lower Unilateral Right  02/03/2013  *RADIOLOGY REPORT*  Clinical Data: Right lower extremity pain and swelling.  RIGHT LOWER EXTREMITY VENOUS DUPLEX ULTRASOUND  Technique:  Gray-scale sonography with graded compression, as well as color Doppler and duplex ultrasound, were performed to evaluate the deep venous system of the lower extremity from the level of the common femoral vein through the popliteal and proximal calf veins. Spectral Doppler was utilized to evaluate flow at rest and with distal augmentation maneuvers.  Comparison:  None.  Findings: There is patent flow in the common femoral, profunda femoral, superficial femoral and popliteal veins.  These vessels were completely compressible and demonstrate increased flow with augmentation.  The peroneal vein in the calf is not compressible.  IMPRESSION:  1.  Peroneal vein thrombosis in the calf. 2.  No other significant findings.   Original Report Authenticated By: Rudie Meyer, M.D.      1. Peroneal DVT (deep venous thrombosis), right       MDM  Pt placed on xarelto and told to follow up with pcp this week:discussed case with Dr. Carlean Jews, NP 02/03/13 1909

## 2013-02-03 NOTE — ED Provider Notes (Signed)
Medical screening examination/treatment/procedure(s) were performed by non-physician practitioner and as supervising physician I was immediately available for consultation/collaboration.   Gwyneth Sprout, MD 02/03/13 2219

## 2013-02-04 NOTE — ED Notes (Signed)
Patient called to state she has documented on her chart that she takes Percocet.  Patient wants this removed from her chart.  Chart reviewed by Dr. Bernette Mayers.  Noted that the prescription for above was prescribed in Nov 2013 by another Physician which shows up as a current prescription.  The prescription for Percocet was marked for removal by the triage nurse during the pt's health assessment on 02/03/13.  S/W patient and explained that the medication is marked for removal. Patient thanked [place] for her help.

## 2013-03-04 ENCOUNTER — Emergency Department (HOSPITAL_BASED_OUTPATIENT_CLINIC_OR_DEPARTMENT_OTHER): Payer: 59

## 2013-03-04 ENCOUNTER — Emergency Department (HOSPITAL_BASED_OUTPATIENT_CLINIC_OR_DEPARTMENT_OTHER)
Admission: EM | Admit: 2013-03-04 | Discharge: 2013-03-05 | Disposition: A | Discharge status: Home / Self Care | Payer: 59 | Attending: Emergency Medicine | Admitting: Emergency Medicine

## 2013-03-04 DIAGNOSIS — G8929 Other chronic pain: Secondary | ICD-10-CM | POA: Insufficient documentation

## 2013-03-04 DIAGNOSIS — Z8619 Personal history of other infectious and parasitic diseases: Secondary | ICD-10-CM | POA: Insufficient documentation

## 2013-03-04 DIAGNOSIS — Z8669 Personal history of other diseases of the nervous system and sense organs: Secondary | ICD-10-CM | POA: Insufficient documentation

## 2013-03-04 DIAGNOSIS — Z79899 Other long term (current) drug therapy: Secondary | ICD-10-CM | POA: Insufficient documentation

## 2013-03-04 DIAGNOSIS — Z8701 Personal history of pneumonia (recurrent): Secondary | ICD-10-CM | POA: Insufficient documentation

## 2013-03-04 DIAGNOSIS — Z8709 Personal history of other diseases of the respiratory system: Secondary | ICD-10-CM | POA: Insufficient documentation

## 2013-03-04 DIAGNOSIS — M79604 Pain in right leg: Secondary | ICD-10-CM

## 2013-03-04 DIAGNOSIS — G43909 Migraine, unspecified, not intractable, without status migrainosus: Secondary | ICD-10-CM | POA: Insufficient documentation

## 2013-03-04 DIAGNOSIS — M79609 Pain in unspecified limb: Secondary | ICD-10-CM | POA: Insufficient documentation

## 2013-03-04 DIAGNOSIS — Z86718 Personal history of other venous thrombosis and embolism: Secondary | ICD-10-CM | POA: Insufficient documentation

## 2013-03-04 DIAGNOSIS — F17200 Nicotine dependence, unspecified, uncomplicated: Secondary | ICD-10-CM | POA: Insufficient documentation

## 2013-03-04 DIAGNOSIS — G90521 Complex regional pain syndrome I of right lower limb: Secondary | ICD-10-CM

## 2013-03-04 DIAGNOSIS — F411 Generalized anxiety disorder: Secondary | ICD-10-CM | POA: Insufficient documentation

## 2013-03-04 DIAGNOSIS — R011 Cardiac murmur, unspecified: Secondary | ICD-10-CM | POA: Insufficient documentation

## 2013-03-04 DIAGNOSIS — G90529 Complex regional pain syndrome I of unspecified lower limb: Secondary | ICD-10-CM | POA: Insufficient documentation

## 2013-03-04 DIAGNOSIS — Z8673 Personal history of transient ischemic attack (TIA), and cerebral infarction without residual deficits: Secondary | ICD-10-CM | POA: Insufficient documentation

## 2013-03-04 DIAGNOSIS — Z7982 Long term (current) use of aspirin: Secondary | ICD-10-CM | POA: Insufficient documentation

## 2013-03-04 MED ORDER — HYDROMORPHONE HCL PF 2 MG/ML IJ SOLN
2.0000 mg | Freq: Once | INTRAMUSCULAR | Status: AC
  Administered 2013-03-04: 2 mg via INTRAMUSCULAR
  Filled 2013-03-04: qty 1

## 2013-03-04 NOTE — ED Notes (Signed)
Note also given to husband for the time he was here

## 2013-03-04 NOTE — ED Notes (Signed)
Pt. Reports R leg pain today and she reports she can't stand the pain.    Pt. Reports she was treated for a blood clot in March 2014 [facility].  Pt. Reports she was placed on a medicine to dissolve the clot.  Pt. Moves the R leg in triage with no difficulty.  Pt. con't to say "I dont know wats goin on but it hurts in the thigh of my leg.  Pt. Reports "I cant take this".

## 2013-03-05 NOTE — ED Provider Notes (Signed)
History     CSN: [id number]  Arrival date & time 03/04/13  2102   First MD Initiated Contact with Patient 03/04/13 2210      Chief Complaint  Patient presents with  . Leg Pain    (Consider location/radiation/quality/duration/timing/severity/associated sxs/prior treatment) HPI Comments: Patient with history of RSD, diagnosed last month with DVT in the same leg.  Presents complaining of severe pain in the right thigh that started this afternoon.  There was no injury or trauma.  She has been taking her xarelto as prescribed.    Patient is a 52 y.o. female presenting with leg pain. The history is provided by the patient.  Leg Pain Lower extremity pain location: right thigh. Injury: no   Pain details:    Quality:  Throbbing and shooting   Radiates to:  Does not radiate   Severity:  Severe   Onset quality:  Sudden   Duration:  8 hours   Timing:  Constant   Progression:  Worsening Chronicity:  New Dislocation: no   Relieved by:  Nothing Worsened by:  Activity, extension, bearing weight, abduction, flexion, adduction, exercise and rotation Ineffective treatments: home narcotic pain meds.   Past Medical History  Diagnosis Date  . Multiple sclerosis   . RSD (reflex sympathetic dystrophy)   . Hepatitis C   . Heart murmur     Dr. Dione Housekeeper  . Anxiety   . Pneumonia     hx of  . Bronchitis     hx of  . TIA (transient ischemic attack)     hx of  . Headache     migraines  . Chronic neck pain   . Blood transfusion without reported diagnosis     Past Surgical History  Procedure Laterality Date  . Neck surgery    . Cesarean section    . Abdominal hysterectomy    . Appendectomy    . Wedge resection      left ovary with cyst removed   . Anterior cervical decomp/discectomy fusion  09/21/2012    Procedure: ANTERIOR CERVICAL DECOMPRESSION/DISCECTOMY FUSION 2 LEVELS;  Surgeon: Clydene Fake, MD;  Location: [facility];  Service: Neurosurgery;  Laterality: N/A;  Cervical  five-six, cervical seven-thoracic one Anterior cervical decompression/diskectomy/fusion/Lifenet bone/trestle plate/Prior Cervical six--seven Anterior cervical fusion/Removing trestle plate  . Ankle surgery      No family history on file.  History  Substance Use Topics  . Smoking status: Current Every Day Smoker -- 0.25 packs/day for 20 years  . Smokeless tobacco: Never Used  . Alcohol Use: No    OB History   Grav Para Term Preterm Abortions TAB SAB Ect Mult Living                  Review of Systems  Respiratory: Negative for shortness of breath.   Cardiovascular: Negative for chest pain.  All other systems reviewed and are negative.    Allergies  Lodine  Home Medications   Current Outpatient Rx  Name  Route  Sig  Dispense  Refill  . aspirin 81 MG tablet   Oral   Take 81 mg by mouth daily.         . clonazePAM (KLONOPIN) 0.5 MG tablet   Oral   Take 1-2 mg by mouth 2 (two) times daily as needed. For anxiety.         . cloNIDine (CATAPRES) 0.1 MG tablet   Oral   Take 0.1 mg by mouth every 8 (eight) hours as  needed. For anxiety.         . cyclobenzaprine (FLEXERIL) 10 MG tablet   Oral   Take 10-20 mg by mouth at bedtime.          . gabapentin (NEURONTIN) 800 MG tablet   Oral   Take 800 mg by mouth 3 (three) times daily.         Marland Kitchen HYDROmorphone HCl (EXALGO) 8 MG T24A   Oral   Take by mouth daily.         Marland Kitchen oxyCODONE-acetaminophen (PERCOCET/ROXICET) 5-325 MG per tablet   Oral   Take 1-2 tablets by mouth every 4 (four) hours as needed for pain.   51 tablet   0   . Rivaroxaban (XARELTO) 20 MG TABS   Oral   Take 1 tablet (20 mg total) by mouth daily with supper.   30 tablet   0   . Tapentadol HCl 100 MG TABS   Oral   Take 100 mg by mouth 3 (three) times daily.           BP 112/70  Pulse 74  Temp(Src) 98 F (36.7 C) (Oral)  Resp 14  Ht 5\' 2"  (1.575 m)  Wt 115 lb (52.164 kg)  BMI 21.03 kg/m2  SpO2 98%  LMP 10/16/2011  Physical  Exam  Nursing note and vitals reviewed. Constitutional: She is oriented to person, place, and time. She appears well-developed and well-nourished.  Patient appears very uncomfortable.  She is writhing and moaning.    HENT:  Head: Normocephalic and atraumatic.  Neck: Normal range of motion. Neck supple.  Cardiovascular: Normal rate and regular rhythm.   No murmur heard. Pulmonary/Chest: Effort normal and breath sounds normal. No respiratory distress.  Musculoskeletal:  The right thigh and calf appear grossly normal.  There is no evidence of redness or erythema or swelling.  She is exquisitely ttp in the right medial mid-thigh.  I am unable to palpate any cords or other abnormality.  The DP and PT pulses are easily palpable and she is able move all toes.  Sensation is intact in the foot and toes.    Neurological: She is alert and oriented to person, place, and time.  Skin: Skin is warm and dry.    ED Course  Procedures (including critical care time)  Labs Reviewed - No data to display US Venous Img Lower Unilateral Right  03/04/2013  *RADIOLOGY REPORT*  Clinical Data: LEG PAIN.  HISTORY OF PERONEAL VEIN THROMBOSIS.  ON BLOOD THINNERS.  RIGHT LOWER EXTREMITY VENOUS DUPLEX ULTRASOUND  Technique: Gray-scale sonography with compression, as well as color and duplex ultrasound, were performed to evaluate the deep venous system from the level of the common femoral vein through the popliteal and proximal calf veins.  Comparison: 02/03/2013  Findings:  Normal compressibility and normal Doppler signal within the common femoral, superficial femoral and popliteal veins, down to the proximal calf veins.  No grayscale filling defects to suggest DVT.  Resolution of previously described peroneal vein thrombosis.  IMPRESSION: No evidence of right lower extremity deep vein thrombosis.  Previously described peroneal vein thrombosis has resolved.   Original Report Authenticated By: Jeronimo Greaves, M.D.      1. Right  leg pain   2. RSD lower limb, right       MDM  The patient presents quite dramatically with complaints of pain in the right thigh.  The exam is completely normal.  As she was diagnosed with a dvt last month, I repeated  an ultrasound to evaluate whether this may have increased in size.  The [place] was negative for dvt.  She was given an injection of dilaudid for her pain which seems to have improved her pain.  I am uncertain as to what the cause of her discomfort is, however there is no evidence that there is a threatened limb.  Pulses, motor, and sensory are all intact.  I doubt cellulitis or infection as there is no warmth, redness, or erythema.  This could potentially be related to her RSD.  I have informed her of the results of the tests and she appears more comfortable.  She will be discharged to home, to return prn for any problems.          Geoffery Lyons, MD 03/05/13 [phone]

## 2013-03-24 ENCOUNTER — Emergency Department (HOSPITAL_BASED_OUTPATIENT_CLINIC_OR_DEPARTMENT_OTHER)
Admission: EM | Admit: 2013-03-24 | Discharge: 2013-03-24 | Disposition: A | Discharge status: Home / Self Care | Payer: 59 | Attending: Emergency Medicine | Admitting: Emergency Medicine

## 2013-03-24 ENCOUNTER — Encounter (HOSPITAL_BASED_OUTPATIENT_CLINIC_OR_DEPARTMENT_OTHER): Payer: Self-pay | Admitting: *Deleted

## 2013-03-24 ENCOUNTER — Emergency Department (HOSPITAL_BASED_OUTPATIENT_CLINIC_OR_DEPARTMENT_OTHER): Payer: 59

## 2013-03-24 DIAGNOSIS — Z8669 Personal history of other diseases of the nervous system and sense organs: Secondary | ICD-10-CM | POA: Insufficient documentation

## 2013-03-24 DIAGNOSIS — F411 Generalized anxiety disorder: Secondary | ICD-10-CM | POA: Insufficient documentation

## 2013-03-24 DIAGNOSIS — M542 Cervicalgia: Secondary | ICD-10-CM | POA: Insufficient documentation

## 2013-03-24 DIAGNOSIS — F17200 Nicotine dependence, unspecified, uncomplicated: Secondary | ICD-10-CM | POA: Insufficient documentation

## 2013-03-24 DIAGNOSIS — Z8619 Personal history of other infectious and parasitic diseases: Secondary | ICD-10-CM | POA: Insufficient documentation

## 2013-03-24 DIAGNOSIS — Z79899 Other long term (current) drug therapy: Secondary | ICD-10-CM | POA: Insufficient documentation

## 2013-03-24 DIAGNOSIS — R071 Chest pain on breathing: Secondary | ICD-10-CM | POA: Insufficient documentation

## 2013-03-24 DIAGNOSIS — G8929 Other chronic pain: Secondary | ICD-10-CM | POA: Insufficient documentation

## 2013-03-24 DIAGNOSIS — Z8701 Personal history of pneumonia (recurrent): Secondary | ICD-10-CM | POA: Insufficient documentation

## 2013-03-24 DIAGNOSIS — Z86718 Personal history of other venous thrombosis and embolism: Secondary | ICD-10-CM | POA: Insufficient documentation

## 2013-03-24 DIAGNOSIS — Z7901 Long term (current) use of anticoagulants: Secondary | ICD-10-CM | POA: Insufficient documentation

## 2013-03-24 DIAGNOSIS — Z8709 Personal history of other diseases of the respiratory system: Secondary | ICD-10-CM | POA: Insufficient documentation

## 2013-03-24 DIAGNOSIS — R0789 Other chest pain: Secondary | ICD-10-CM

## 2013-03-24 DIAGNOSIS — Z7982 Long term (current) use of aspirin: Secondary | ICD-10-CM | POA: Insufficient documentation

## 2013-03-24 DIAGNOSIS — G43909 Migraine, unspecified, not intractable, without status migrainosus: Secondary | ICD-10-CM | POA: Insufficient documentation

## 2013-03-24 DIAGNOSIS — R011 Cardiac murmur, unspecified: Secondary | ICD-10-CM | POA: Insufficient documentation

## 2013-03-24 DIAGNOSIS — Z8673 Personal history of transient ischemic attack (TIA), and cerebral infarction without residual deficits: Secondary | ICD-10-CM | POA: Insufficient documentation

## 2013-03-24 LAB — CBC WITH DIFFERENTIAL/PLATELET
Basophils Absolute: 0 10*3/uL (ref 0.0–0.1)
Basophils Relative: 0 % (ref 0–1)
Eosinophils Relative: 2 % (ref 0–5)
HCT: 37.6 % (ref 36.0–46.0)
Hemoglobin: 14 g/dL (ref 12.0–15.0)
Lymphocytes Relative: 63 % — ABNORMAL HIGH (ref 12–46)
Monocytes Relative: 7 % (ref 3–12)
Neutro Abs: 1 10*3/uL — ABNORMAL LOW (ref 1.7–7.7)
RBC: 4.42 MIL/uL (ref 3.87–5.11)
RDW: 12.1 % (ref 11.5–15.5)
WBC: 3.4 10*3/uL — ABNORMAL LOW (ref 4.0–10.5)

## 2013-03-24 LAB — BASIC METABOLIC PANEL
Chloride: 104 mEq/L (ref 96–112)
GFR calc Af Amer: >90 mL/min (ref >90)
Potassium: 3.7 mEq/L (ref 3.5–5.1)
Sodium: 140 mEq/L (ref 135–145)

## 2013-03-24 MED ORDER — OXYCODONE-ACETAMINOPHEN 5-325 MG PO TABS
2.0000 | ORAL_TABLET | Freq: Once | ORAL | Status: AC
  Administered 2013-03-24: 2 via ORAL
  Filled 2013-03-24 (×2): qty 2

## 2013-03-24 MED ORDER — IOHEXOL 350 MG/ML SOLN
100.0000 mL | Freq: Once | INTRAVENOUS | Status: AC | PRN
  Administered 2013-03-24: 100 mL via INTRAVENOUS

## 2013-03-24 NOTE — ED Provider Notes (Signed)
History     CSN: [id number]  Arrival date & time 03/24/13  1318   First MD Initiated Contact with Patient 03/24/13 1407      Chief Complaint  Patient presents with  . Chest Pain    (Consider location/radiation/quality/duration/timing/severity/associated sxs/prior treatment) Patient is a 52 y.o. female presenting with chest pain. The history is provided by the patient. No language interpreter was used.  Chest Pain Pain location:  L chest Pain quality: aching   Pain radiates to:  Does not radiate Pain radiates to the back: no   Pain severity:  Moderate Timing:  Constant Progression:  Unchanged Context: breathing   Relieved by:  Nothing Worsened by:  Nothing tried Ineffective treatments:  Antacids Associated symptoms comment:  Recent dvt Pt complains of pain in her left chest.   Pt was diagnosed with a   Past Medical History  Diagnosis Date  . Multiple sclerosis   . RSD (reflex sympathetic dystrophy)   . Hepatitis C   . Heart murmur     Dr. Dione Housekeeper  . Anxiety   . Pneumonia     hx of  . Bronchitis     hx of  . TIA (transient ischemic attack)     hx of  . Headache     migraines  . Chronic neck pain   . Blood transfusion without reported diagnosis     Past Surgical History  Procedure Laterality Date  . Neck surgery    . Cesarean section    . Abdominal hysterectomy    . Appendectomy    . Wedge resection      left ovary with cyst removed   . Anterior cervical decomp/discectomy fusion  09/21/2012    Procedure: ANTERIOR CERVICAL DECOMPRESSION/DISCECTOMY FUSION 2 LEVELS;  Surgeon: Clydene Fake, MD;  Location: [facility];  Service: Neurosurgery;  Laterality: N/A;  Cervical five-six, cervical seven-thoracic one Anterior cervical decompression/diskectomy/fusion/Lifenet bone/trestle plate/Prior Cervical six--seven Anterior cervical fusion/Removing trestle plate  . Ankle surgery      History reviewed. No pertinent family history.  History  Substance Use Topics   . Smoking status: Current Every Day Smoker -- 0.25 packs/day for 20 years  . Smokeless tobacco: Never Used  . Alcohol Use: No    OB History   Grav Para Term Preterm Abortions TAB SAB Ect Mult Living                  Review of Systems  Cardiovascular: Positive for chest pain.  All other systems reviewed and are negative.    Allergies  Lodine  Home Medications   Current Outpatient Rx  Name  Route  Sig  Dispense  Refill  . aspirin 81 MG tablet   Oral   Take 81 mg by mouth daily.         . clonazePAM (KLONOPIN) 0.5 MG tablet   Oral   Take 1-2 mg by mouth 2 (two) times daily as needed. For anxiety.         . cloNIDine (CATAPRES) 0.1 MG tablet   Oral   Take 0.1 mg by mouth every 8 (eight) hours as needed. For anxiety.         . cyclobenzaprine (FLEXERIL) 10 MG tablet   Oral   Take 10-20 mg by mouth at bedtime.          . gabapentin (NEURONTIN) 800 MG tablet   Oral   Take 800 mg by mouth 3 (three) times daily.         Marland Kitchen  HYDROmorphone HCl (EXALGO) 8 MG T24A   Oral   Take by mouth daily.         Marland Kitchen oxyCODONE-acetaminophen (PERCOCET/ROXICET) 5-325 MG per tablet   Oral   Take 1-2 tablets by mouth every 4 (four) hours as needed for pain.   51 tablet   0   . Rivaroxaban (XARELTO) 20 MG TABS   Oral   Take 1 tablet (20 mg total) by mouth daily with supper.   30 tablet   0   . Tapentadol HCl 100 MG TABS   Oral   Take 100 mg by mouth 3 (three) times daily.           BP 132/75  Pulse 75  Temp(Src) 98.2 F (36.8 C) (Oral)  Resp 14  Ht 5\' 11"  (1.803 m)  Wt 145 lb (65.772 kg)  BMI 20.23 kg/m2  SpO2 100%  LMP 10/16/2011  Physical Exam  Nursing note and vitals reviewed. Constitutional: She appears well-developed and well-nourished.  HENT:  Head: Normocephalic and atraumatic.  Eyes: Pupils are equal, round, and reactive to light.  Neck: Normal range of motion.  Cardiovascular: Normal rate and normal heart sounds.   Pulmonary/Chest: Effort  normal and breath sounds normal.  Abdominal: Soft.  Musculoskeletal: Normal range of motion. She exhibits tenderness.  Tender right calf  Neurological: She is alert.  Skin: Skin is warm.  Psychiatric: She has a normal mood and affect.    ED Course  Procedures (including critical care time)  Labs Reviewed  BASIC METABOLIC PANEL - Abnormal; Notable for the following:    Glucose, Bld 111 (*)    GFR calc non Af Amer 83 (*)    All other components within normal limits  CBC WITH DIFFERENTIAL - Abnormal; Notable for the following:    WBC 3.4 (*)    MCHC 37.2 (*)    Neutrophils Relative % 28 (*)    Lymphocytes Relative 63 (*)    Neutro Abs 1.0 (*)    All other components within normal limits   Ct Angio Chest W/cm &/or Wo Cm  03/24/2013   *RADIOLOGY REPORT*  Clinical Data: Chest pain and tightness.  Left-sided.  History deep venous thrombosis.  Hepatitis C.  Prior TIA.  Smoker.  CT ANGIOGRAPHY CHEST  Technique:  Multidetector CT imaging of the chest using the standard protocol during bolus administration of intravenous contrast. Multiplanar reconstructed images including MIPs were obtained and reviewed to evaluate the vascular anatomy.  Contrast: OMNIPAQUE IOHEXOL 350 MG/ML SOLN  Comparison: Plain film 02/10/2013.  No prior chest CT.  Findings: Lung windows demonstrate minimal centrilobular emphysema. Minimal exclusion of the far lung bases. Pleural-based left apical area of nodularity measures 5 mm transverse image 13/series 7 and 6 mm on coronal image 62.  Likely adjacent or contiguous smaller nodule more inferiorly. 4 mm left upper lobe lung nodule on image 36/series 7. 4 mm left upper lobe nodule on image 43/series 7. 4 mm left lower lobe lung nodule on image 70/series 7.  Smaller left lower lobe nodules on images 55 and 41.  Soft tissue windows:  The quality of this exam for evaluation of pulmonary embolism is good to excellent.  No evidence of pulmonary embolism.  1.4 cm left thyroid  nodule suspected on image 6/series 5.  This area is degraded by beam hardening artifact from overlying soft tissues.  Normal aortic caliber without dissection. Bovine arch.  Normal heart size without pericardial or pleural effusion.  No mediastinal or hilar  adenopathy.  Minimal residual thymic tissue within the anterior mediastinum.  Limited abdominal imaging demonstrates mild cirrhosis, incompletely imaged. Lower cervical spine fixation.  IMPRESSION:  1. No evidence of pulmonary embolism. 2.  Pulmonary nodules up to 6 mm. Given the concurrent centrilobular emphysema, follow-up chest CT at 6 - 12 months is recommended.  This recommendation follows the consensus statement: "Guidelines for Management of Small Pulmonary Nodules Detected on CT Scans: A Statement from the Fleischner Society" as published in Radiology 2005; 237:395-400. Online at: DietDisorder.cz. 3.  Mild cirrhosis. 4.  Possible left-sided thyroid nodule. Consider further evaluation with thyroid ultrasound.  If patient is clinically hyperthyroid, consider nuclear medicine thyroid uptake and scan.   Original Report Authenticated By: Jeronimo Greaves, M.D.     No diagnosis found.    MDM   Results for orders placed during the hospital encounter of 03/24/13  BASIC METABOLIC PANEL      Result Value Range   Sodium 140  135 - 145 mEq/L   Potassium 3.7  3.5 - 5.1 mEq/L   Chloride 104  96 - 112 mEq/L   CO2 26  19 - 32 mEq/L   Glucose, Bld 111 (*) 70 - 99 mg/dL   BUN 9  6 - 23 mg/dL   Creatinine, Ser 8.65  0.50 - 1.10 mg/dL   Calcium 9.4  8.4 - 78.4 mg/dL   GFR calc non Af Amer 83 (*) >90 mL/min   GFR calc Af Amer >90  >90 mL/min  CBC WITH DIFFERENTIAL      Result Value Range   WBC 3.4 (*) 4.0 - 10.5 K/uL   RBC 4.42  3.87 - 5.11 MIL/uL   Hemoglobin 14.0  12.0 - 15.0 g/dL   HCT 69.6  29.5 - 28.4 %   MCV 85.1  78.0 - 100.0 fL   MCH 31.7  26.0 - 34.0 pg   MCHC 37.2 (*) 30.0 - 36.0 g/dL   RDW 13.2  44.0  - 10.2 %   Platelets 164  150 - 400 K/uL   Neutrophils Relative % 28 (*) 43 - 77 %   Lymphocytes Relative 63 (*) 12 - 46 %   Monocytes Relative 7  3 - 12 %   Eosinophils Relative 2  0 - 5 %   Basophils Relative 0  0 - 1 %   Neutro Abs 1.0 (*) 1.7 - 7.7 K/uL   Lymphs Abs 2.1  0.7 - 4.0 K/uL   Monocytes Absolute 0.2  0.1 - 1.0 K/uL   Eosinophils Absolute 0.1  0.0 - 0.7 K/uL   Basophils Absolute 0.0  0.0 - 0.1 K/uL   Ct Angio Chest W/cm &/or Wo Cm  03/24/2013   *RADIOLOGY REPORT*  Clinical Data: Chest pain and tightness.  Left-sided.  History deep venous thrombosis.  Hepatitis C.  Prior TIA.  Smoker.  CT ANGIOGRAPHY CHEST  Technique:  Multidetector CT imaging of the chest using the standard protocol during bolus administration of intravenous contrast. Multiplanar reconstructed images including MIPs were obtained and reviewed to evaluate the vascular anatomy.  Contrast: OMNIPAQUE IOHEXOL 350 MG/ML SOLN  Comparison: Plain film 02/10/2013.  No prior chest CT.  Findings: Lung windows demonstrate minimal centrilobular emphysema. Minimal exclusion of the far lung bases. Pleural-based left apical area of nodularity measures 5 mm transverse image 13/series 7 and 6 mm on coronal image 62.  Likely adjacent or contiguous smaller nodule more inferiorly. 4 mm left upper lobe lung nodule on image 36/series 7. 4 mm  left upper lobe nodule on image 43/series 7. 4 mm left lower lobe lung nodule on image 70/series 7.  Smaller left lower lobe nodules on images 55 and 41.  Soft tissue windows:  The quality of this exam for evaluation of pulmonary embolism is good to excellent.  No evidence of pulmonary embolism.  1.4 cm left thyroid nodule suspected on image 6/series 5.  This area is degraded by beam hardening artifact from overlying soft tissues.  Normal aortic caliber without dissection. Bovine arch.  Normal heart size without pericardial or pleural effusion.  No mediastinal or hilar adenopathy.  Minimal residual  thymic tissue within the anterior mediastinum.  Limited abdominal imaging demonstrates mild cirrhosis, incompletely imaged. Lower cervical spine fixation.  IMPRESSION:  1. No evidence of pulmonary embolism. 2.  Pulmonary nodules up to 6 mm. Given the concurrent centrilobular emphysema, follow-up chest CT at 6 - 12 months is recommended.  This recommendation follows the consensus statement: "Guidelines for Management of Small Pulmonary Nodules Detected on CT Scans: A Statement from the Fleischner Society" as published in Radiology 2005; 237:395-400. Online at: DietDisorder.cz. 3.  Mild cirrhosis. 4.  Possible left-sided thyroid nodule. Consider further evaluation with thyroid ultrasound.  If patient is clinically hyperthyroid, consider nuclear medicine thyroid uptake and scan.   Original Report Authenticated By: Jeronimo Greaves, M.D.   US Venous Img Lower Unilateral Right  03/04/2013   *RADIOLOGY REPORT*  Clinical Data: LEG PAIN.  HISTORY OF PERONEAL VEIN THROMBOSIS.  ON BLOOD THINNERS.  RIGHT LOWER EXTREMITY VENOUS DUPLEX ULTRASOUND  Technique: Gray-scale sonography with compression, as well as color and duplex ultrasound, were performed to evaluate the deep venous system from the level of the common femoral vein through the popliteal and proximal calf veins.  Comparison: 02/03/2013  Findings:  Normal compressibility and normal Doppler signal within the common femoral, superficial femoral and popliteal veins, down to the proximal calf veins.  No grayscale filling defects to suggest DVT.  Resolution of previously described peroneal vein thrombosis.  IMPRESSION: No evidence of right lower extremity deep vein thrombosis.  Previously described peroneal vein thrombosis has resolved.   Original Report Authenticated By: Jeronimo Greaves, M.D.     Date: 03/24/2013  Rate: 82  Rhythm: normal sinus rhythm  QRS Axis: normal  Intervals: normal  ST/T Wave abnormalities: normal  Conduction  Disutrbances:none  Narrative Interpretation:   Old EKG Reviewed: unchanged   Pt advised no sign of a blood clot.  Pt advised she needs follow up ct of chest in 6 months and that she needs to have an ultrasound of her thyroid.   Pt advised to continue xarelto and current medications    Elson Areas, PA-C 03/24/13 1554  Lonia Skinner [place], [place] 03/24/13 1557

## 2013-03-24 NOTE — ED Provider Notes (Signed)
Medical screening examination/treatment/procedure(s) were performed by non-physician practitioner and as supervising physician I was immediately available for consultation/collaboration.   Rolan Bucco, MD 03/24/13 [phone]

## 2013-03-24 NOTE — ED Notes (Signed)
Pt c/o left side chest pain while standing with nausea only . Recent DX DVT 4/24

## 2013-04-06 ENCOUNTER — Other Ambulatory Visit: Payer: Self-pay | Admitting: Family Medicine

## 2013-04-06 DIAGNOSIS — E041 Nontoxic single thyroid nodule: Secondary | ICD-10-CM

## 2013-04-09 ENCOUNTER — Ambulatory Visit
Admission: RE | Admit: 2013-04-09 | Discharge: 2013-04-09 | Disposition: A | Discharge status: Home / Self Care | Payer: 59 | Source: Ambulatory Visit | Attending: Family Medicine | Admitting: Family Medicine

## 2013-04-09 DIAGNOSIS — E041 Nontoxic single thyroid nodule: Secondary | ICD-10-CM

## 2013-04-22 ENCOUNTER — Observation Stay (HOSPITAL_COMMUNITY)
Admission: EM | Admit: 2013-04-22 | Discharge: 2013-04-23 | Disposition: A | Discharge status: Home / Self Care | Payer: 59 | Attending: Internal Medicine | Admitting: Internal Medicine

## 2013-04-22 ENCOUNTER — Encounter (HOSPITAL_COMMUNITY): Payer: Self-pay | Admitting: Emergency Medicine

## 2013-04-22 DIAGNOSIS — G894 Chronic pain syndrome: Secondary | ICD-10-CM | POA: Diagnosis present

## 2013-04-22 DIAGNOSIS — G35 Multiple sclerosis: Secondary | ICD-10-CM | POA: Insufficient documentation

## 2013-04-22 DIAGNOSIS — Z79899 Other long term (current) drug therapy: Secondary | ICD-10-CM | POA: Insufficient documentation

## 2013-04-22 DIAGNOSIS — Z86718 Personal history of other venous thrombosis and embolism: Secondary | ICD-10-CM | POA: Insufficient documentation

## 2013-04-22 DIAGNOSIS — M79661 Pain in right lower leg: Secondary | ICD-10-CM

## 2013-04-22 DIAGNOSIS — G90521 Complex regional pain syndrome I of right lower limb: Secondary | ICD-10-CM

## 2013-04-22 DIAGNOSIS — M79604 Pain in right leg: Secondary | ICD-10-CM | POA: Diagnosis present

## 2013-04-22 DIAGNOSIS — M79609 Pain in unspecified limb: Secondary | ICD-10-CM | POA: Insufficient documentation

## 2013-04-22 DIAGNOSIS — I82409 Acute embolism and thrombosis of unspecified deep veins of unspecified lower extremity: Principal | ICD-10-CM | POA: Insufficient documentation

## 2013-04-22 DIAGNOSIS — Z7901 Long term (current) use of anticoagulants: Secondary | ICD-10-CM

## 2013-04-22 DIAGNOSIS — G90529 Complex regional pain syndrome I of unspecified lower limb: Secondary | ICD-10-CM | POA: Diagnosis present

## 2013-04-22 DIAGNOSIS — O2230 Deep phlebothrombosis in pregnancy, unspecified trimester: Secondary | ICD-10-CM | POA: Diagnosis present

## 2013-04-22 HISTORY — DX: Acute embolism and thrombosis of unspecified deep veins of unspecified lower extremity: I82.409

## 2013-04-22 LAB — CBC WITH DIFFERENTIAL/PLATELET
Basophils Relative: 1 % (ref 0–1)
Eosinophils Relative: 2 % (ref 0–5)
HCT: 37.5 % (ref 36.0–46.0)
Hemoglobin: 13.8 g/dL (ref 12.0–15.0)
MCH: 31.7 pg (ref 26.0–34.0)
MCHC: 36.8 g/dL — ABNORMAL HIGH (ref 30.0–36.0)
MCV: 86 fL (ref 78.0–100.0)
Monocytes Absolute: 0.3 10*3/uL (ref 0.1–1.0)
Monocytes Relative: 8 % (ref 3–12)
Neutro Abs: 1.1 10*3/uL — ABNORMAL LOW (ref 1.7–7.7)
RDW: 12.6 % (ref 11.5–15.5)

## 2013-04-22 MED ORDER — ONDANSETRON HCL 4 MG/2ML IJ SOLN
4.0000 mg | Freq: Once | INTRAMUSCULAR | Status: AC
  Administered 2013-04-22: 4 mg via INTRAVENOUS
  Filled 2013-04-22: qty 2

## 2013-04-22 MED ORDER — HYDROMORPHONE HCL PF 1 MG/ML IJ SOLN
1.0000 mg | Freq: Once | INTRAMUSCULAR | Status: AC
  Administered 2013-04-22: 1 mg via INTRAVENOUS
  Filled 2013-04-22: qty 1

## 2013-04-22 NOTE — ED Notes (Signed)
PMS remains intact. Pedal pulse weak. Pt c/o mild tingling in leg

## 2013-04-22 NOTE — ED Notes (Signed)
PT. REPORTS PAIN AT RIGHT LEG WITH SWELLING ONSET TODAY , DENIES INJURY OR FALL , PT. STATED HISTORY OF DVT ,  FAINT PEDAL PULSE .

## 2013-04-22 NOTE — ED Notes (Signed)
Ron Hermin, Husband of Pt requests to be contacted if discharged tonight at the following number: [phone]

## 2013-04-22 NOTE — ED Provider Notes (Signed)
History     CSN: [id number]  Arrival date & time 04/22/13  1952   First MD Initiated Contact with Patient 04/22/13 2326      Chief Complaint  Patient presents with  . Leg Pain    (Consider location/radiation/quality/duration/timing/severity/associated sxs/prior treatment) Patient is a 52 y.o. female presenting with leg pain. The history is provided by the patient.  Leg Pain She is currently being treated for DVT with rivaroxaban. She had onset this morning of pain in her right calf associated with swelling of the right lower leg. Pain is severe and she rated it at 10/10. It is slightly better here while laying still and it is down to 8/10. She states that her leg feels tight. It is difficult for her to identify the specific pain today because she also has reflex sympathetic dystrophy in the same leg. In March, she had been diagnosed with DVT and started on rivaroxaban,   and repeat ultrasound in April showed resolution of the clot. She also has a history of a superficial thrombophlebitis in the past. She has no risk factors for DVT and her PCP was considering evaluation for hypercoagulable state. She denies any history of trauma. She took a dose of new symptoms at home with no relief. She is under the care of a chronic pain management clinic.   Past Medical History  Diagnosis Date  . Multiple sclerosis   . RSD (reflex sympathetic dystrophy)   . Hepatitis C   . Heart murmur     Dr. Dione Housekeeper  . Anxiety   . Pneumonia     hx of  . Bronchitis     hx of  . TIA (transient ischemic attack)     hx of  . Headache(784.0)     migraines  . Chronic neck pain   . Blood transfusion without reported diagnosis   . DVT (deep venous thrombosis)     Past Surgical History  Procedure Laterality Date  . Neck surgery    . Cesarean section    . Abdominal hysterectomy    . Appendectomy    . Wedge resection      left ovary with cyst removed   . Anterior cervical decomp/discectomy fusion   09/21/2012    Procedure: ANTERIOR CERVICAL DECOMPRESSION/DISCECTOMY FUSION 2 LEVELS;  Surgeon: Clydene Fake, MD;  Location: [facility];  Service: Neurosurgery;  Laterality: N/A;  Cervical five-six, cervical seven-thoracic one Anterior cervical decompression/diskectomy/fusion/Lifenet bone/trestle plate/Prior Cervical six--seven Anterior cervical fusion/Removing trestle plate  . Ankle surgery      No family history on file.  History  Substance Use Topics  . Smoking status: Current Every Day Smoker -- 0.25 packs/day for 20 years  . Smokeless tobacco: Never Used  . Alcohol Use: No    OB History   Grav Para Term Preterm Abortions TAB SAB Ect Mult Living                  Review of Systems  All other systems reviewed and are negative.    Allergies  Lodine  Home Medications   Current Outpatient Rx  Name  Route  Sig  Dispense  Refill  . clonazePAM (KLONOPIN) 0.5 MG tablet   Oral   Take 0.5 mg by mouth 2 (two) times daily as needed for anxiety.          . cloNIDine (CATAPRES) 0.1 MG tablet   Oral   Take 0.1 mg by mouth every 8 (eight) hours as needed (anxiety).          Marland Kitchen  cyclobenzaprine (FLEXERIL) 10 MG tablet   Oral   Take 10 mg by mouth 4 (four) times daily as needed for muscle spasms.          Marland Kitchen gabapentin (NEURONTIN) 800 MG tablet   Oral   Take 800 mg by mouth 4 (four) times daily.          Marland Kitchen HYDROmorphone HCl (EXALGO) 12 MG T24A   Oral   Take 12 mg by mouth daily as needed (pain).         . mometasone (NASONEX) 50 MCG/ACT nasal spray   Nasal   Place 1 spray into the nose daily as needed (seasonal allergies).         . Multiple Vitamins-Minerals (HAIR/SKIN/NAILS) TABS   Oral   Take 1 tablet by mouth daily.         . Rivaroxaban (XARELTO) 20 MG TABS   Oral   Take 1 tablet (20 mg total) by mouth daily with supper.   30 tablet   0   . Tapentadol HCl 100 MG TABS   Oral   Take 100 mg by mouth 3 (three) times daily.           BP 127/71   Pulse 71  Temp(Src) 98.8 F (37.1 C) (Oral)  Resp 14  SpO2 99%  LMP 10/16/2011  Physical Exam  Nursing note and vitals reviewed.  52 year old female, resting comfortably and in no acute distress. Vital signs are  normal . Oxygen saturation is 99%, which is normal. Head is normocephalic and atraumatic. PERRLA, EOMI. Oropharynx is clear. Neck is nontender and supple without adenopathy or JVD. Back is nontender and there is no CVA tenderness. Lungs are clear without rales, wheezes, or rhonchi. Chest is nontender. Heart has regular rate and rhythm without murmur. Abdomen is soft, flat, nontender without masses or hepatosplenomegaly and peristalsis is normoactive. Extremities have no cyanosis or edema, full range of motion is present. right calf circumference is approximately 2 cm greater than left calf circumference. The right Is tender diffusely without any palpable cord, and no warmth, no erythema. Distal neurovascular is intact with normal sensation, prompt capillary refill. Skin is warm and dry without rash. Neurologic: Mental status is normal, cranial nerves are intact, there are no motor or sensory deficits.  ED Course  Procedures (including critical care time)  Results for orders placed during the hospital encounter of 04/22/13  CBC WITH DIFFERENTIAL      Result Value Range   WBC 4.2  4.0 - 10.5 K/uL   RBC 4.36  3.87 - 5.11 MIL/uL   Hemoglobin 13.8  12.0 - 15.0 g/dL   HCT 96.0  45.4 - 09.8 %   MCV 86.0  78.0 - 100.0 fL   MCH 31.7  26.0 - 34.0 pg   MCHC 36.8 (*) 30.0 - 36.0 g/dL   RDW 11.9  14.7 - 82.9 %   Platelets 150  150 - 400 K/uL   Neutrophils Relative % 25 (*) 43 - 77 %   Neutro Abs 1.1 (*) 1.7 - 7.7 K/uL   Lymphocytes Relative 65 (*) 12 - 46 %   Lymphs Abs 2.7  0.7 - 4.0 K/uL   Monocytes Relative 8  3 - 12 %   Monocytes Absolute 0.3  0.1 - 1.0 K/uL   Eosinophils Relative 2  0 - 5 %   Eosinophils Absolute 0.1  0.0 - 0.7 K/uL   Basophils Relative 1  0 - 1 %  Basophils Absolute 0.0  0.0 - 0.1 K/uL  BASIC METABOLIC PANEL      Result Value Range   Sodium 139  135 - 145 mEq/L   Potassium 4.4  3.5 - 5.1 mEq/L   Chloride 103  96 - 112 mEq/L   CO2 31  19 - 32 mEq/L   Glucose, Bld 81  70 - 99 mg/dL   BUN 10  6 - 23 mg/dL   Creatinine, Ser 1.61  0.50 - 1.10 mg/dL   Calcium 9.4  8.4 - 09.6 mg/dL   GFR calc non Af Amer >90  >90 mL/min   GFR calc Af Amer >90  >90 mL/min     1. Right calf pain       MDM  Right calf pain which could represent recurrence of DVT, could represent spontaneous bleeding into the calf. She will need venous Doppler to evaluate. She is in severe pain currently and will be treated with intravenous hydromorphone.  She got no relief with the dose of hydromorphone. A second dose of hydromorphone will be given. Case is discussed with Dr. Conley Rolls of [facility]. She will be admitted under observation status for pain control and to get venous Doppler in the morning.  Dione Booze, MD 04/23/13 [phone]

## 2013-04-22 NOTE — ED Notes (Signed)
Dr. Glick at bedside.  

## 2013-04-23 ENCOUNTER — Encounter (HOSPITAL_COMMUNITY): Payer: Self-pay | Admitting: Internal Medicine

## 2013-04-23 DIAGNOSIS — M79609 Pain in unspecified limb: Secondary | ICD-10-CM

## 2013-04-23 DIAGNOSIS — G90529 Complex regional pain syndrome I of unspecified lower limb: Secondary | ICD-10-CM | POA: Diagnosis present

## 2013-04-23 DIAGNOSIS — M7989 Other specified soft tissue disorders: Secondary | ICD-10-CM

## 2013-04-23 DIAGNOSIS — Z7901 Long term (current) use of anticoagulants: Secondary | ICD-10-CM

## 2013-04-23 DIAGNOSIS — O2230 Deep phlebothrombosis in pregnancy, unspecified trimester: Secondary | ICD-10-CM | POA: Diagnosis present

## 2013-04-23 DIAGNOSIS — M79604 Pain in right leg: Secondary | ICD-10-CM | POA: Diagnosis present

## 2013-04-23 DIAGNOSIS — G894 Chronic pain syndrome: Secondary | ICD-10-CM

## 2013-04-23 DIAGNOSIS — Z86718 Personal history of other venous thrombosis and embolism: Secondary | ICD-10-CM

## 2013-04-23 LAB — BASIC METABOLIC PANEL
BUN: 10 mg/dL (ref 6–23)
Calcium: 9.4 mg/dL (ref 8.4–10.5)
Chloride: 103 mEq/L (ref 96–112)
Creatinine, Ser: 0.68 mg/dL (ref 0.50–1.10)
GFR calc Af Amer: >90 mL/min (ref >90)

## 2013-04-23 MED ORDER — DOCUSATE SODIUM 100 MG PO CAPS
100.0000 mg | ORAL_CAPSULE | Freq: Two times a day (BID) | ORAL | Status: DC
  Administered 2013-04-23: 100 mg via ORAL
  Filled 2013-04-23 (×2): qty 1

## 2013-04-23 MED ORDER — GABAPENTIN 400 MG PO CAPS
800.0000 mg | ORAL_CAPSULE | Freq: Three times a day (TID) | ORAL | Status: DC
  Administered 2013-04-23 (×2): 800 mg via ORAL
  Filled 2013-04-23 (×6): qty 2

## 2013-04-23 MED ORDER — HYDROMORPHONE HCL ER 12 MG PO T24A: 12.0000 mg | EXTENDED_RELEASE_TABLET | Freq: Every day | ORAL | Status: DC | PRN

## 2013-04-23 MED ORDER — ONDANSETRON HCL 4 MG/2ML IJ SOLN: 4.0000 mg | Freq: Four times a day (QID) | INTRAMUSCULAR | Status: DC | PRN

## 2013-04-23 MED ORDER — CLONAZEPAM 0.5 MG PO TABS
0.5000 mg | ORAL_TABLET | Freq: Two times a day (BID) | ORAL | Status: DC | PRN
  Administered 2013-04-23: 0.5 mg via ORAL
  Filled 2013-04-23: qty 1

## 2013-04-23 MED ORDER — CLONIDINE HCL 0.1 MG PO TABS
0.1000 mg | ORAL_TABLET | Freq: Three times a day (TID) | ORAL | Status: DC | PRN
  Filled 2013-04-23: qty 1

## 2013-04-23 MED ORDER — HYDROMORPHONE HCL PF 1 MG/ML IJ SOLN
1.0000 mg | INTRAMUSCULAR | Status: DC | PRN
  Administered 2013-04-23 (×2): 1 mg via INTRAVENOUS
  Filled 2013-04-23 (×2): qty 1

## 2013-04-23 MED ORDER — OXYCODONE HCL 5 MG PO TABS
10.0000 mg | ORAL_TABLET | ORAL | Status: DC | PRN
  Administered 2013-04-23: 10 mg via ORAL
  Filled 2013-04-23: qty 2

## 2013-04-23 MED ORDER — HYDROMORPHONE HCL PF 1 MG/ML IJ SOLN
2.0000 mg | Freq: Once | INTRAMUSCULAR | Status: AC
  Administered 2013-04-23: 2 mg via INTRAVENOUS
  Filled 2013-04-23: qty 2

## 2013-04-23 MED ORDER — ONDANSETRON HCL 4 MG PO TABS: 4.0000 mg | ORAL_TABLET | Freq: Four times a day (QID) | ORAL | Status: DC | PRN

## 2013-04-23 MED ORDER — RIVAROXABAN 20 MG PO TABS
20.0000 mg | ORAL_TABLET | Freq: Every day | ORAL | Status: DC
  Filled 2013-04-23: qty 1

## 2013-04-23 MED ORDER — HYDROMORPHONE HCL 2 MG PO TABS: 4.0000 mg | ORAL_TABLET | Freq: Three times a day (TID) | ORAL | Status: DC | PRN

## 2013-04-23 MED ORDER — WHITE PETROLATUM GEL
Status: AC
  Administered 2013-04-23: 0.2
  Filled 2013-04-23: qty 5

## 2013-04-23 MED ORDER — CYCLOBENZAPRINE HCL 10 MG PO TABS
10.0000 mg | ORAL_TABLET | Freq: Four times a day (QID) | ORAL | Status: DC | PRN
  Filled 2013-04-23: qty 1

## 2013-04-23 MED ORDER — FLUTICASONE PROPIONATE 50 MCG/ACT NA SUSP
1.0000 | Freq: Every day | NASAL | Status: DC
  Administered 2013-04-23: 1 via NASAL
  Filled 2013-04-23 (×2): qty 16

## 2013-04-23 NOTE — Discharge Summary (Signed)
Physician Discharge Summary  Becky Wolfe WUJ:[id number] DOB: 09-26-1961 DOA: 04/22/2013  PCP: Becky Raider, MD  Admit date: 04/22/2013 Discharge date: 04/23/2013  Time spent: 45 minutes  Recommendations for Outpatient Follow-up:  Adjust pain meds per pain management  Discharge Diagnoses:     Right leg pain    DVT (deep vein thrombosis)    Chronic pain disorder   RSD lower limb   Chronic anticoagulation   Discharge Condition: stable   Filed Weights   04/23/13 0311  Weight: 75.5 kg (166 lb 7.2 oz)    History of present illness:  RAFEEF Wolfe is an 52 y.o. female with hx of chronic pain syndrome, including pain of the right lower leg from RSD (reflex sympathetic dystrophy), on chronic narcotics, hx of DVT about a month ago, on Xarelto with compliance, anxiety, Multiple sclerosis, fell in her garage about a week ago, presents to the ER with severe pain in the right leg. There has been no fever or chills, no significant swelling of her legs. She didn't run out of her pain meds. She denied any contusion on the leg and no new medication. Evaluation in the ER included a normal WBC and unremarkable electrolytes with normal renal fx tests. She had difficult with pain control in the ER, and no available doppler at this time so hospitalist was asked to admit her for pain control and to r/out DVT.   Hospital Course:  Doppler negative. Pain improved. F/u with pain management  Discharge Exam: Filed Vitals:   04/23/13 0200 04/23/13 0311 04/23/13 0546 04/23/13 0921  BP: 105/62 133/64 127/63 155/95  Pulse: 76 80 67 80  Temp:  97.9 F (36.6 C) 97.5 F (36.4 C) 97.5 F (36.4 C)  TempSrc:  Oral Oral Oral  Resp: 18 18 18 18   Height:  5\' 2"  (1.575 m)    Weight:  75.5 kg (166 lb 7.2 oz)    SpO2: 96% 98% 98% 99%    General: pleasant, occasionally tearful when talking about chronic pain struggles Leg:  Tender. Well healed surgical scar  Discharge Instructions  Discharge Orders    Future Orders Complete By Expires     Activity as tolerated - No restrictions  As directed     Diet general  As directed         Medication List    TAKE these medications       clonazePAM 0.5 MG tablet  Commonly known as:  KLONOPIN  Take 0.5 mg by mouth 2 (two) times daily as needed for anxiety.     cloNIDine 0.1 MG tablet  Commonly known as:  CATAPRES  Take 0.1 mg by mouth every 8 (eight) hours as needed (anxiety).     cyclobenzaprine 10 MG tablet  Commonly known as:  FLEXERIL  Take 10 mg by mouth 4 (four) times daily as needed for muscle spasms.     gabapentin 800 MG tablet  Commonly known as:  NEURONTIN  Take 800 mg by mouth 4 (four) times daily.     HAIR/SKIN/NAILS Tabs  Take 1 tablet by mouth daily.     HYDROmorphone HCl 12 MG T24a  Commonly known as:  EXALGO  Take 12 mg by mouth daily as needed (pain).     mometasone 50 MCG/ACT nasal spray  Commonly known as:  NASONEX  Place 1 spray into the nose daily as needed (seasonal allergies).     Rivaroxaban 20 MG Tabs  Commonly known as:  XARELTO  Take 1 tablet (20  mg total) by mouth daily with supper.     Tapentadol HCl 100 MG Tabs  Take 100 mg by mouth 3 (three) times daily.       Allergies  Allergen Reactions  . Lodine (Etodolac) Swelling    Facial swelling      The results of significant diagnostics from this hospitalization (including imaging, microbiology, ancillary and laboratory) are listed below for reference.    Significant Diagnostic Studies: Ct Angio Chest W/cm &/or Wo Cm  03/24/2013   *RADIOLOGY REPORT*  Clinical Data: Chest pain and tightness.  Left-sided.  History deep venous thrombosis.  Hepatitis C.  Prior TIA.  Smoker.  CT ANGIOGRAPHY CHEST  Technique:  Multidetector CT imaging of the chest using the standard protocol during bolus administration of intravenous contrast. Multiplanar reconstructed images including MIPs were obtained and reviewed to evaluate the vascular anatomy.  Contrast:  OMNIPAQUE IOHEXOL 350 MG/ML SOLN  Comparison: Plain film 02/10/2013.  No prior chest CT.  Findings: Lung windows demonstrate minimal centrilobular emphysema. Minimal exclusion of the far lung bases. Pleural-based left apical area of nodularity measures 5 mm transverse image 13/series 7 and 6 mm on coronal image 62.  Likely adjacent or contiguous smaller nodule more inferiorly. 4 mm left upper lobe lung nodule on image 36/series 7. 4 mm left upper lobe nodule on image 43/series 7. 4 mm left lower lobe lung nodule on image 70/series 7.  Smaller left lower lobe nodules on images 55 and 41.  Soft tissue windows:  The quality of this exam for evaluation of pulmonary embolism is good to excellent.  No evidence of pulmonary embolism.  1.4 cm left thyroid nodule suspected on image 6/series 5.  This area is degraded by beam hardening artifact from overlying soft tissues.  Normal aortic caliber without dissection. Bovine arch.  Normal heart size without pericardial or pleural effusion.  No mediastinal or hilar adenopathy.  Minimal residual thymic tissue within the anterior mediastinum.  Limited abdominal imaging demonstrates mild cirrhosis, incompletely imaged. Lower cervical spine fixation.  IMPRESSION:  1. No evidence of pulmonary embolism. 2.  Pulmonary nodules up to 6 mm. Given the concurrent centrilobular emphysema, follow-up chest CT at 6 - 12 months is recommended.  This recommendation follows the consensus statement: "Guidelines for Management of Small Pulmonary Nodules Detected on CT Scans: A Statement from the Fleischner Society" as published in Radiology 2005; 237:395-400. Online at: DietDisorder.cz. 3.  Mild cirrhosis. 4.  Possible left-sided thyroid nodule. Consider further evaluation with thyroid ultrasound.  If patient is clinically hyperthyroid, consider nuclear medicine thyroid uptake and scan.   Original Report Authenticated By: Jeronimo Greaves, M.D.   US Soft Tissue  Head/neck  04/09/2013   *RADIOLOGY REPORT*  Clinical Data: Thyroid nodule  THYROID ULTRASOUND  Technique: Ultrasound examination of the thyroid gland and adjacent soft tissues was performed.  Comparison:  None.  Findings:  There is relative homogeneity of the thyroid parenchymal echotexture.  Right thyroid lobe:  6.2 x 1.8 x 1.8 cm Left thyroid lobe:  5.8 x 1.6 x 1.8 cm Isthmus:  0.6 cm in diameter  Focal nodules:  Tiny sub centimeter hypoechoic nodules are seen within both lobes of the thyroid, right greater than left.  Dominant nodule within the right lobe of the thyroid measures 3 mm in diameter while dominant nodule within the left lobe of the thyroid measures 1 mm in diameter.  Lymphadenopathy:  None visualized.  IMPRESSION: Tiny (<3 mm) nodules within both lobes of the thyroid, right greater than  left, none of which meet size criteria for percutaneous sampling. Otherwise, unremarkable thyroid ultrasound.   Original Report Authenticated By: Tacey Ruiz, MD    Microbiology: No results found for this or any previous visit (from the past 240 hour(s)).   Labs: Basic Metabolic Panel:  Recent Labs Lab 04/22/13 2339  NA 139  K 4.4  CL 103  CO2 31  GLUCOSE 81  BUN 10  CREATININE 0.68  CALCIUM 9.4   Liver Function Tests: No results found for this basename: AST, ALT, ALKPHOS, BILITOT, PROT, ALBUMIN,  in the last 168 hours No results found for this basename: LIPASE, AMYLASE,  in the last 168 hours No results found for this basename: AMMONIA,  in the last 168 hours CBC:  Recent Labs Lab 04/22/13 2339  WBC 4.2  NEUTROABS 1.1*  HGB 13.8  HCT 37.5  MCV 86.0  PLT 150    Signed:  Cherrill Scrima L  [facility] 04/23/2013, 12:56 PM

## 2013-04-23 NOTE — ED Notes (Addendum)
Pedal pulse palpable, right extremity slightly cooler to touch than left leg

## 2013-04-23 NOTE — ED Notes (Signed)
Dr Le at bedside.  

## 2013-04-23 NOTE — H&P (Signed)
[facility] History and Physical  [name]:[id number] DOB: May 17, 1961    PCP:   Lupita Raider, MD   Chief Complaint: acute right leg pain.  HPI: Becky Wolfe is an 52 y.o. female with hx of chronic pain syndrome, including pain of the right lower leg from RSD (reflex sympathetic dystrophy), on chronic narcotics, hx of DVT about a month ago, on Xarelto with compliance, anxiety, Multiple sclerosis, fell in her garage about a week ago, presents to the ER with severe pain in the right leg.  There has been no fever or chills, no significant swelling of her legs.  She didn't run out of her pain meds.  She denied any contusion on the leg and no new medication.  Evaluation in the ER included a normal WBC and unremarkable electrolytes with normal renal fx tests.  She had difficult with pain control in the ER, and no available doppler at this time so hospitalist was asked to admit her for pain control and to r/out DVT.  Rewiew of Systems:  Constitutional: Negative for malaise, fever and chills. No significant weight loss or weight gain Eyes: Negative for eye pain, redness and discharge, diplopia, visual changes, or flashes of light. ENMT: Negative for ear pain, hoarseness, nasal congestion, sinus pressure and sore throat. No headaches; tinnitus, drooling, or problem swallowing. Cardiovascular: Negative for chest pain, palpitations, diaphoresis, dyspnea and peripheral edema. ; No orthopnea, PND Respiratory: Negative for cough, hemoptysis, wheezing and stridor. No pleuritic chestpain. Gastrointestinal: Negative for nausea, vomiting, diarrhea, constipation, abdominal pain, melena, blood in stool, hematemesis, jaundice and rectal bleeding.    Genitourinary: Negative for frequency, dysuria, incontinence,flank pain and hematuria; Musculoskeletal: Negative for  neck pain.  Skin: . Negative for pruritus, rash, abrasions, bruising and skin lesion.; ulcerations Neuro: Negative for headache,  lightheadedness and neck stiffness. Negative for weakness, altered level of consciousness , altered mental status, extremity weakness, burning feet, involuntary movement, seizure and syncope.  Psych: negative for anxiety, depression, insomnia, tearfulness, panic attacks, hallucinations, paranoia, suicidal or homicidal ideation    Past Medical History  Diagnosis Date  . Multiple sclerosis   . RSD (reflex sympathetic dystrophy)   . Hepatitis C   . Heart murmur     Dr. Dione Housekeeper  . Anxiety   . Pneumonia     hx of  . Bronchitis     hx of  . TIA (transient ischemic attack)     hx of  . Headache(784.0)     migraines  . Chronic neck pain   . Blood transfusion without reported diagnosis   . DVT (deep venous thrombosis)     Past Surgical History  Procedure Laterality Date  . Neck surgery    . Cesarean section    . Abdominal hysterectomy    . Appendectomy    . Wedge resection      left ovary with cyst removed   . Anterior cervical decomp/discectomy fusion  09/21/2012    Procedure: ANTERIOR CERVICAL DECOMPRESSION/DISCECTOMY FUSION 2 LEVELS;  Surgeon: Clydene Fake, MD;  Location: [facility];  Service: Neurosurgery;  Laterality: N/A;  Cervical five-six, cervical seven-thoracic one Anterior cervical decompression/diskectomy/fusion/Lifenet bone/trestle plate/Prior Cervical six--seven Anterior cervical fusion/Removing trestle plate  . Ankle surgery      Medications:  HOME MEDS: Prior to Admission medications   Medication Sig Start Date End Date Taking? Authorizing Provider  clonazePAM (KLONOPIN) 0.5 MG tablet Take 0.5 mg by mouth 2 (two) times daily as needed for anxiety.    Yes  Historical Provider, MD  cloNIDine (CATAPRES) 0.1 MG tablet Take 0.1 mg by mouth every 8 (eight) hours as needed (anxiety).    Yes Historical Provider, MD  cyclobenzaprine (FLEXERIL) 10 MG tablet Take 10 mg by mouth 4 (four) times daily as needed for muscle spasms.  04/28/12  Yes Historical Provider, MD   gabapentin (NEURONTIN) 800 MG tablet Take 800 mg by mouth 4 (four) times daily.    Yes Historical Provider, MD  HYDROmorphone HCl (EXALGO) 12 MG T24A Take 12 mg by mouth daily as needed (pain).   Yes Historical Provider, MD  mometasone (NASONEX) 50 MCG/ACT nasal spray Place 1 spray into the nose daily as needed (seasonal allergies).   Yes Historical Provider, MD  Multiple Vitamins-Minerals (HAIR/SKIN/NAILS) TABS Take 1 tablet by mouth daily.   Yes Historical Provider, MD  Rivaroxaban (XARELTO) 20 MG TABS Take 1 tablet (20 mg total) by mouth daily with supper. 02/03/13  Yes Teressa Lower, NP  Tapentadol HCl 100 MG TABS Take 100 mg by mouth 3 (three) times daily.   Yes Historical Provider, MD     Allergies:  Allergies  Allergen Reactions  . Lodine (Etodolac) Swelling    Facial swelling    Social History:   reports that she has been smoking.  She has never used smokeless tobacco. She reports that she does not drink alcohol or use illicit drugs.  Family History: No family history on file.   Physical Exam: Filed Vitals:   04/23/13 0015 04/23/13 0045 04/23/13 0100 04/23/13 0130  BP: 124/75 122/67 130/72 110/72  Pulse: 72 73 78 70  Temp:      TempSrc:      Resp: 13 15 20 20   SpO2: 99% 99% 100% 95%   Blood pressure 110/72, pulse 70, temperature 98.8 F (37.1 C), temperature source Oral, resp. rate 20, last menstrual period 10/16/2011, SpO2 95.00%.  GEN:  Pleasant patient lying in the stretcher in no acute distress; cooperative with exam. PSYCH:  alert and oriented x4; does not appear anxious or depressed; affect is appropriate. HEENT: Mucous membranes pink and anicteric; PERRLA; EOM intact; no cervical lymphadenopathy nor thyromegaly or carotid bruit; no JVD; There were no stridor. Neck is very supple. Breasts:: Not examined CHEST WALL: No tenderness CHEST: Normal respiration, clear to auscultation bilaterally.  HEART: Regular rate and rhythm.  There are no murmur, rub, or  gallops.   BACK: No kyphosis or scoliosis; no CVA tenderness ABDOMEN: soft and non-tender; no masses, no organomegaly, normal abdominal bowel sounds; no pannus; no intertriginous candida. There is no rebound and no distention. Rectal Exam: Not done EXTREMITIES: No bone or joint deformity; age-appropriate arthropathy of the hands and knees; right leg is cooler but with good distal pulse.  No loss in sensation and good motor. Genitalia: not examined PULSES: 2+ and symmetric SKIN: Normal hydration no rash or ulceration CNS: Cranial nerves 2-12 grossly intact no focal lateralizing neurologic deficit.  Speech is fluent; uvula elevated with phonation, facial symmetry and tongue midline. DTR are normal bilaterally, cerebella exam is intact, barbinski is negative and strengths are equaled bilaterally.  No sensory loss.   Labs on Admission:  Basic Metabolic Panel:  Recent Labs Lab 04/22/13 2339  NA 139  K 4.4  CL 103  CO2 31  GLUCOSE 81  BUN 10  CREATININE 0.68  CALCIUM 9.4   Liver Function Tests: No results found for this basename: AST, ALT, ALKPHOS, BILITOT, PROT, ALBUMIN,  in the last 168 hours No results found for  this basename: LIPASE, AMYLASE,  in the last 168 hours No results found for this basename: AMMONIA,  in the last 168 hours CBC:  Recent Labs Lab 04/22/13 2339  WBC 4.2  NEUTROABS 1.1*  HGB 13.8  HCT 37.5  MCV 86.0  PLT 150   Cardiac Enzymes: No results found for this basename: CKTOTAL, CKMB, CKMBINDEX, TROPONINI,  in the last 168 hours  CBG: No results found for this basename: GLUCAP,  in the last 168 hours   Radiological Exams on Admission: No results found.  Assessment/Plan Present on Admission:  . Right leg pain . DVT (deep vein thrombosis) in pregnancy . Chronic pain disorder . RSD lower limb  PLAN: She has chronic pain from RSD, and has increase pain today.  She fell a week ago, but there is no obvious injury.  Although she has been compliant with  her Xarelto, she could have anther DVT and need to have a repeated US tomorrow.  I will continue her medications and give a little IV Dilaudid for pain.  There is no evidence of infection or bleeding into the calf.  The coolness of the right leg is likely RSD itself.  She is stable, full code, and will be admitted to [facility] service. Thank you for allowing me to take care of your nice patient.  Other plans as per orders.  Code Status: FULL Unk Lightning, MD. [facility] Pager [phone] 7pm to 7am.  04/23/2013, 2:14 AM

## 2013-04-23 NOTE — Progress Notes (Signed)
*  PRELIMINARY RESULTS* Vascular Ultrasound Right lower extremity venous duplex has been completed.  Preliminary findings: Right = negative for DVT. A small baker's cyst is noted.    Farrel Demark, RDMS, RVT  04/23/2013, 11:20 AM

## 2013-04-23 NOTE — Progress Notes (Signed)
Patient discharged home with family. Patient was discharged with instructions. No new prescriptions were given. Patient was told to call doctor with questions and concerns. Patient was stable upon discharge.

## 2013-06-07 ENCOUNTER — Other Ambulatory Visit: Payer: Self-pay | Admitting: Internal Medicine

## 2013-06-07 DIAGNOSIS — K7460 Unspecified cirrhosis of liver: Secondary | ICD-10-CM

## 2013-06-10 ENCOUNTER — Ambulatory Visit
Admission: RE | Admit: 2013-06-10 | Discharge: 2013-06-10 | Disposition: A | Discharge status: Home / Self Care | Payer: 59 | Source: Ambulatory Visit | Attending: Internal Medicine | Admitting: Internal Medicine

## 2013-06-10 ENCOUNTER — Other Ambulatory Visit: Payer: 59

## 2013-06-10 DIAGNOSIS — K7460 Unspecified cirrhosis of liver: Secondary | ICD-10-CM

## 2013-06-14 ENCOUNTER — Encounter (HOSPITAL_COMMUNITY): Payer: Self-pay | Admitting: Pharmacy Technician

## 2013-06-16 ENCOUNTER — Encounter (HOSPITAL_COMMUNITY): Payer: Self-pay | Admitting: Pharmacy Technician

## 2013-06-16 ENCOUNTER — Encounter (HOSPITAL_COMMUNITY): Payer: Self-pay | Admitting: *Deleted

## 2013-06-16 DIAGNOSIS — F419 Anxiety disorder, unspecified: Secondary | ICD-10-CM

## 2013-06-16 HISTORY — DX: Anxiety disorder, unspecified: F41.9

## 2013-06-29 ENCOUNTER — Other Ambulatory Visit: Payer: Self-pay | Admitting: Gastroenterology

## 2013-06-30 ENCOUNTER — Encounter (HOSPITAL_COMMUNITY)
Admission: RE | Disposition: A | Discharge status: Home / Self Care | Payer: Self-pay | Source: Ambulatory Visit | Attending: Gastroenterology

## 2013-06-30 ENCOUNTER — Ambulatory Visit (HOSPITAL_COMMUNITY)
Admission: RE | Admit: 2013-06-30 | Discharge: 2013-06-30 | Disposition: A | Discharge status: Home / Self Care | Payer: 59 | Source: Ambulatory Visit | Attending: Gastroenterology | Admitting: Gastroenterology

## 2013-06-30 ENCOUNTER — Encounter (HOSPITAL_COMMUNITY): Payer: Self-pay

## 2013-06-30 ENCOUNTER — Encounter (HOSPITAL_COMMUNITY): Payer: Self-pay | Admitting: Anesthesiology

## 2013-06-30 ENCOUNTER — Ambulatory Visit (HOSPITAL_COMMUNITY): Payer: 59 | Admitting: Anesthesiology

## 2013-06-30 DIAGNOSIS — G709 Myoneural disorder, unspecified: Secondary | ICD-10-CM | POA: Insufficient documentation

## 2013-06-30 DIAGNOSIS — K7460 Unspecified cirrhosis of liver: Secondary | ICD-10-CM | POA: Insufficient documentation

## 2013-06-30 DIAGNOSIS — G90529 Complex regional pain syndrome I of unspecified lower limb: Secondary | ICD-10-CM | POA: Insufficient documentation

## 2013-06-30 DIAGNOSIS — Z8701 Personal history of pneumonia (recurrent): Secondary | ICD-10-CM | POA: Insufficient documentation

## 2013-06-30 DIAGNOSIS — K2970 Gastritis, unspecified, without bleeding: Secondary | ICD-10-CM | POA: Insufficient documentation

## 2013-06-30 DIAGNOSIS — Z8673 Personal history of transient ischemic attack (TIA), and cerebral infarction without residual deficits: Secondary | ICD-10-CM | POA: Insufficient documentation

## 2013-06-30 DIAGNOSIS — Z7901 Long term (current) use of anticoagulants: Secondary | ICD-10-CM | POA: Insufficient documentation

## 2013-06-30 DIAGNOSIS — Z79899 Other long term (current) drug therapy: Secondary | ICD-10-CM | POA: Insufficient documentation

## 2013-06-30 DIAGNOSIS — F17200 Nicotine dependence, unspecified, uncomplicated: Secondary | ICD-10-CM | POA: Insufficient documentation

## 2013-06-30 DIAGNOSIS — R011 Cardiac murmur, unspecified: Secondary | ICD-10-CM | POA: Insufficient documentation

## 2013-06-30 DIAGNOSIS — F411 Generalized anxiety disorder: Secondary | ICD-10-CM | POA: Insufficient documentation

## 2013-06-30 DIAGNOSIS — K589 Irritable bowel syndrome without diarrhea: Secondary | ICD-10-CM | POA: Insufficient documentation

## 2013-06-30 DIAGNOSIS — I10 Essential (primary) hypertension: Secondary | ICD-10-CM | POA: Insufficient documentation

## 2013-06-30 DIAGNOSIS — Z8249 Family history of ischemic heart disease and other diseases of the circulatory system: Secondary | ICD-10-CM | POA: Insufficient documentation

## 2013-06-30 DIAGNOSIS — B182 Chronic viral hepatitis C: Secondary | ICD-10-CM | POA: Insufficient documentation

## 2013-06-30 DIAGNOSIS — E049 Nontoxic goiter, unspecified: Secondary | ICD-10-CM | POA: Insufficient documentation

## 2013-06-30 HISTORY — PX: ESOPHAGOGASTRODUODENOSCOPY (EGD) WITH PROPOFOL: SHX5813

## 2013-06-30 SURGERY — ESOPHAGOGASTRODUODENOSCOPY (EGD) WITH PROPOFOL
Anesthesia: Monitor Anesthesia Care

## 2013-06-30 MED ORDER — BUTAMBEN-TETRACAINE-BENZOCAINE 2-2-14 % EX AERO
INHALATION_SPRAY | CUTANEOUS | Status: DC | PRN
  Administered 2013-06-30: 2 via TOPICAL

## 2013-06-30 MED ORDER — KETAMINE HCL 10 MG/ML IJ SOLN
INTRAMUSCULAR | Status: DC | PRN
  Administered 2013-06-30 (×3): 10 mg via INTRAVENOUS

## 2013-06-30 MED ORDER — LACTATED RINGERS IV SOLN
INTRAVENOUS | Status: DC
  Administered 2013-06-30: 1000 mL via INTRAVENOUS

## 2013-06-30 MED ORDER — PROPOFOL INFUSION 10 MG/ML OPTIME
INTRAVENOUS | Status: DC | PRN
  Administered 2013-06-30: 180 ug/kg/min via INTRAVENOUS

## 2013-06-30 MED ORDER — SODIUM CHLORIDE 0.9 % IV SOLN: INTRAVENOUS | Status: DC

## 2013-06-30 MED ORDER — ONDANSETRON HCL 4 MG/2ML IJ SOLN
INTRAMUSCULAR | Status: DC | PRN
  Administered 2013-06-30: 4 mg via INTRAVENOUS

## 2013-06-30 MED ORDER — MIDAZOLAM HCL 5 MG/5ML IJ SOLN
INTRAMUSCULAR | Status: DC | PRN
  Administered 2013-06-30: 2 mg via INTRAVENOUS

## 2013-06-30 SURGICAL SUPPLY — 15 items

## 2013-06-30 NOTE — H&P (Signed)
Patient interval history reviewed.  Patient examined again.  There has been no change from documented H/P dated 06/09/13 (scanned into chart from our office) except as documented above.  Assessment:  1.  Hepatitis C with cirrhosis.  Plan:  1.  Endoscopy for variceal screening.  ON xarelto. 2.  Risks (bleeding, infection, bowel perforation that could require surgery, sedation-related changes in cardiopulmonary systems), benefits (identification and possible treatment of source of symptoms, exclusion of certain causes of symptoms), and alternatives (watchful waiting, radiographic imaging studies, empiric medical treatment) of upper endoscopy (EGD) were explained to patient/family in detail and patient wishes to proceed.

## 2013-06-30 NOTE — Transfer of Care (Signed)
Immediate Anesthesia Transfer of Care Note  Patient: Becky Wolfe  Procedure(s) Performed: Procedure(s): ESOPHAGOGASTRODUODENOSCOPY (EGD) WITH PROPOFOL (N/A)  Patient Location: PACU and Endoscopy Unit  Anesthesia Type:MAC  Level of Consciousness: awake and alert   Airway & Oxygen Therapy: Patient Spontanous Breathing and Patient connected to nasal cannula oxygen  Post-op Assessment: Report given to PACU RN and Post -op Vital signs reviewed and stable  Post vital signs: Reviewed and stable  Complications: No apparent anesthesia complications

## 2013-06-30 NOTE — Anesthesia Preprocedure Evaluation (Signed)
Anesthesia Evaluation  Patient identified by MRN, date of birth, ID band Patient awake    Reviewed: Allergy & Precautions, H&P , NPO status , Patient's Chart, lab work & pertinent test results  History of Anesthesia Complications (+) PONVNegative for: history of anesthetic complications  Airway Mallampati: I  Neck ROM: Full    Dental  (+) Teeth Intact and Dental Advisory Given   Pulmonary pneumonia -, resolved, Current Smoker,  breath sounds clear to auscultation  Pulmonary exam normal       Cardiovascular Exercise Tolerance: Poor hypertension, Pt. on medications + Valvular Problems/Murmurs Rhythm:Regular Rate:Normal  --------------------------------------------------------------------  Study Conclusions   - Left ventricle: Systolic function was normal. The estimated    ejection fraction was in the range of 55% to 65%. Doppler    parameters are consistent with abnormal left ventricular    relaxation (grade 1 diastolic dysfunction).  - Aortic valve: Moderate regurgitation.  - Mitral valve: Calcified annulus. Mild to moderate regurgitation.  Transthoracic echocardiography. M-mode, complete 2D, spectral  Doppler, and color Doppler. Height: Height: 160cm. Height: 63in.  Weight: Weight: 65.8kg. Weight: 144.7lb. Body mass index: BMI:  25.7kg/m^2. Body surface area: BSA: 1.50m^2. Blood pressure: 116/76.  Patient status: Inpatient. Location: Echo laboratory. 07-31-10   Neuro/Psych  Headaches, Anxiety TIA Neuromuscular disease (RSD)    GI/Hepatic negative GI ROS, (+) Hepatitis -, C  Endo/Other  negative endocrine ROS  Renal/GU negative Renal ROS     Musculoskeletal   Abdominal   Peds  Hematology negative hematology ROS (+)   Anesthesia Other Findings RSD of lower extremity.  Chronic Pain Management PT.  Dr Roderic Ovens.  Reproductive/Obstetrics                           Anesthesia Physical  Anesthesia  Plan  ASA: II  Anesthesia Plan: General   Post-op Pain Management:    Induction: Intravenous  Airway Management Planned: Oral ETT  Additional Equipment:   Intra-op Plan:   Post-operative Plan: Extubation in OR  Informed Consent: I have reviewed the patients History and Physical, chart, labs and discussed the procedure including the risks, benefits and alternatives for the proposed anesthesia with the patient or authorized representative who has indicated his/her understanding and acceptance.   Dental advisory given  Plan Discussed with: CRNA and Surgeon  Anesthesia Plan Comments:         Anesthesia Quick Evaluation

## 2013-06-30 NOTE — Addendum Note (Signed)
Addended by: Willis Modena on: 06/30/2013 08:17 AM   Modules accepted: Orders

## 2013-06-30 NOTE — Op Note (Signed)
[facility] [address] [place], [address]   ENDOSCOPY PROCEDURE REPORT  PATIENT: Becky, Wolfe.  MR#: [id number] BIRTHDATE: 1961-09-03 , 52  yrs. old GENDER: Female ENDOSCOPIST: Willis Modena, MD REFERRED BY:  Lupita Raider, M.D. PROCEDURE DATE:  06/30/2013 PROCEDURE:  EGD, diagnostic ASA CLASS:     Class II INDICATIONS:  cirrhosis, variceal screening. MEDICATIONS: MAC sedation, administered by CRNA TOPICAL ANESTHETIC: Cetacaine Spray  DESCRIPTION OF PROCEDURE: After the risks benefits and alternatives of the procedure were thoroughly explained, informed consent was obtained.  The diagnostic forward-viewing endoscope was introduced through the mouth and advanced to the second portion of the duodenum. Without limitations.  The instrument was slowly withdrawn as the mucosa was fully examined.    Findings:  Normal esophagus; no evidence of esophageal varices. Mild antral-predominant gastritis, no portal gastropathy and no gastric varices, otherwise normal stomach.              The scope was then withdrawn from the patient and the procedure completed.  ENDOSCOPIC IMPRESSION:     As above.  No evidence of esophageal or gastric varices.  RECOMMENDATIONS:     1.  Watch for potential complications of procedure. 2.  Repeat EGD in 2-3 years for repeat variceal screening. 3.  Follow-up with [facility] on as-needed basis (she sees  eSigned:  Willis Modena, MD 06/30/2013 12:17 PM   CC:

## 2013-06-30 NOTE — Anesthesia Postprocedure Evaluation (Signed)
  Anesthesia Post-op Note  Patient: Becky Wolfe  Procedure(s) Performed: Procedure(s) (LRB): ESOPHAGOGASTRODUODENOSCOPY (EGD) WITH PROPOFOL (N/A)  Patient Location: PACU  Anesthesia Type: MAC  Level of Consciousness: awake and alert   Airway and Oxygen Therapy: Patient Spontanous Breathing  Post-op Pain: mild  Post-op Assessment: Post-op Vital signs reviewed, Patient's Cardiovascular Status Stable, Respiratory Function Stable, Patent Airway and No signs of Nausea or vomiting  Last Vitals:  Filed Vitals:   06/30/13 1245  BP: 134/77  Temp:   Resp: 59    Post-op Vital Signs: stable   Complications: No apparent anesthesia complications

## 2013-07-01 ENCOUNTER — Encounter (HOSPITAL_COMMUNITY): Payer: Self-pay | Admitting: Gastroenterology

## 2013-07-11 ENCOUNTER — Encounter (HOSPITAL_COMMUNITY): Payer: Self-pay | Admitting: Emergency Medicine

## 2013-07-11 ENCOUNTER — Emergency Department (HOSPITAL_COMMUNITY)
Admission: EM | Admit: 2013-07-11 | Discharge: 2013-07-11 | Disposition: A | Discharge status: Home / Self Care | Payer: 59 | Attending: Emergency Medicine | Admitting: Emergency Medicine

## 2013-07-11 ENCOUNTER — Emergency Department (HOSPITAL_COMMUNITY): Payer: 59

## 2013-07-11 DIAGNOSIS — F411 Generalized anxiety disorder: Secondary | ICD-10-CM | POA: Insufficient documentation

## 2013-07-11 DIAGNOSIS — M79604 Pain in right leg: Secondary | ICD-10-CM

## 2013-07-11 DIAGNOSIS — Z86718 Personal history of other venous thrombosis and embolism: Secondary | ICD-10-CM | POA: Insufficient documentation

## 2013-07-11 DIAGNOSIS — G9050 Complex regional pain syndrome I, unspecified: Secondary | ICD-10-CM

## 2013-07-11 DIAGNOSIS — Z79899 Other long term (current) drug therapy: Secondary | ICD-10-CM | POA: Insufficient documentation

## 2013-07-11 DIAGNOSIS — Z8709 Personal history of other diseases of the respiratory system: Secondary | ICD-10-CM | POA: Insufficient documentation

## 2013-07-11 DIAGNOSIS — R0602 Shortness of breath: Secondary | ICD-10-CM | POA: Insufficient documentation

## 2013-07-11 DIAGNOSIS — R011 Cardiac murmur, unspecified: Secondary | ICD-10-CM | POA: Insufficient documentation

## 2013-07-11 DIAGNOSIS — Z8619 Personal history of other infectious and parasitic diseases: Secondary | ICD-10-CM | POA: Insufficient documentation

## 2013-07-11 DIAGNOSIS — Z8669 Personal history of other diseases of the nervous system and sense organs: Secondary | ICD-10-CM | POA: Insufficient documentation

## 2013-07-11 DIAGNOSIS — G90529 Complex regional pain syndrome I of unspecified lower limb: Secondary | ICD-10-CM | POA: Insufficient documentation

## 2013-07-11 DIAGNOSIS — Z8701 Personal history of pneumonia (recurrent): Secondary | ICD-10-CM | POA: Insufficient documentation

## 2013-07-11 DIAGNOSIS — Z87891 Personal history of nicotine dependence: Secondary | ICD-10-CM | POA: Insufficient documentation

## 2013-07-11 DIAGNOSIS — Z8673 Personal history of transient ischemic attack (TIA), and cerebral infarction without residual deficits: Secondary | ICD-10-CM | POA: Insufficient documentation

## 2013-07-11 DIAGNOSIS — R0789 Other chest pain: Secondary | ICD-10-CM | POA: Insufficient documentation

## 2013-07-11 DIAGNOSIS — G8929 Other chronic pain: Secondary | ICD-10-CM | POA: Insufficient documentation

## 2013-07-11 LAB — COMPREHENSIVE METABOLIC PANEL
AST: 26 U/L (ref 0–37)
Albumin: 4.1 g/dL (ref 3.5–5.2)
Calcium: 10.1 mg/dL (ref 8.4–10.5)
Creatinine, Ser: 0.78 mg/dL (ref 0.50–1.10)
Total Protein: 8.4 g/dL — ABNORMAL HIGH (ref 6.0–8.3)

## 2013-07-11 LAB — CBC WITH DIFFERENTIAL/PLATELET
Basophils Absolute: 0 10*3/uL (ref 0.0–0.1)
Basophils Relative: 1 % (ref 0–1)
Eosinophils Absolute: 0.1 10*3/uL (ref 0.0–0.7)
Eosinophils Relative: 1 % (ref 0–5)
HCT: 41.6 % (ref 36.0–46.0)
MCH: 31.1 pg (ref 26.0–34.0)
MCHC: 37.3 g/dL — ABNORMAL HIGH (ref 30.0–36.0)
MCV: 83.4 fL (ref 78.0–100.0)
Monocytes Absolute: 0.4 10*3/uL (ref 0.1–1.0)
Neutro Abs: 1.6 10*3/uL — ABNORMAL LOW (ref 1.7–7.7)
RDW: 12.2 % (ref 11.5–15.5)

## 2013-07-11 LAB — TROPONIN I: Troponin I: <0.3 ng/mL (ref <0.30)

## 2013-07-11 MED ORDER — IOHEXOL 350 MG/ML SOLN: 100.0000 mL | Freq: Once | INTRAVENOUS | Status: DC | PRN

## 2013-07-11 MED ORDER — HYDROMORPHONE HCL PF 1 MG/ML IJ SOLN
1.0000 mg | Freq: Once | INTRAMUSCULAR | Status: AC
  Administered 2013-07-11: 1 mg via INTRAVENOUS
  Filled 2013-07-11: qty 1

## 2013-07-11 MED ORDER — LORAZEPAM 2 MG/ML IJ SOLN
1.0000 mg | Freq: Once | INTRAMUSCULAR | Status: AC
  Administered 2013-07-11: 1 mg via INTRAVENOUS
  Filled 2013-07-11: qty 1

## 2013-07-11 NOTE — Progress Notes (Signed)
VASCULAR LAB PRELIMINARY  PRELIMINARY  PRELIMINARY  PRELIMINARY  Bilateral lower extremity venous Dopplers completed.    Preliminary report:  There is no DVT or SVT noted in the bilateral lower extremities.  Alcus Bradly, RVT 07/11/2013, 3:05 PM

## 2013-07-11 NOTE — ED Provider Notes (Signed)
CSN: [id number]     Arrival date & time 07/11/13  1314 History   First MD Initiated Contact with Patient 07/11/13 1326     Chief Complaint  Patient presents with  . Leg Pain  . Chest Pain   (Consider location/radiation/quality/duration/timing/severity/associated sxs/prior Treatment) HPI Comments: Patient presents with cramps in her right leg that are more severe than usual. She has a history of RSD and chronic lower extremity pain and cramping. She reports 3 minute episode of left-sided chest pain today that is now resolved. He was underneath her left breast and associated with shortness of breath. She denies any cough, fever, chills, nausea or vomiting. She is concerned about a blood clot as she has a history of DVT with states compliance with xarelto. She denies any focal weakness, numbness or tingling. She's been taking her medications at home as scheduled.  The history is provided by the patient.    Past Medical History  Diagnosis Date  . Multiple sclerosis     dx. -64yrs ago, then med stopped-due to liver enzymes changes,occ. periods of weakness in arms"drops things"  . Heart murmur     Dr. Dione Housekeeper  . Bronchitis     hx of  . TIA (transient ischemic attack)     hx of (weakness left side)-none in 1 yr  . Chronic neck pain   . Blood transfusion without reported diagnosis     20 yrs ago after childbirth  . DVT (deep venous thrombosis)     rt. leg   . Anxiety 06-16-13    hx. panic attacks, none recent  . Headache(784.0)     migraines-has decreased  . Hepatitis C     dx. Hep. C(s/p transfusion age 60) low Hgb  .-multiple transfusions.  . RSD (reflex sympathetic dystrophy) 06-16-13    legs  . Pneumonia     hx of , ? mild emphysema on CT scan 5'14, multiple pulmonary nodules   Past Surgical History  Procedure Laterality Date  . Neck surgery    . Cesarean section    . Abdominal hysterectomy    . Appendectomy    . Wedge resection      left ovary with cyst removed ,hx. multiple  cysts removed prior  . Anterior cervical decomp/discectomy fusion  09/21/2012    Procedure: ANTERIOR CERVICAL DECOMPRESSION/DISCECTOMY FUSION 2 LEVELS;  Surgeon: Clydene Fake, MD;  Location: [facility];  Service: Neurosurgery;  Laterality: N/A;  Cervical five-six, cervical seven-thoracic one Anterior cervical decompression/diskectomy/fusion/Lifenet bone/trestle plate/Prior Cervical six--seven Anterior cervical fusion/Removing trestle plate  . Ankle surgery Right     no retained hardware  . Esophagogastroduodenoscopy (egd) with propofol N/A 06/30/2013    Procedure: ESOPHAGOGASTRODUODENOSCOPY (EGD) WITH PROPOFOL;  Surgeon: Willis Modena, MD;  Location: [facility];  Service: Endoscopy;  Laterality: N/A;   History reviewed. No pertinent family history. History  Substance Use Topics  . Smoking status: Former Smoker -- 0.25 packs/day for 20 years    Quit date: 02/14/2013  . Smokeless tobacco: Never Used  . Alcohol Use: No   OB History   Grav Para Term Preterm Abortions TAB SAB Ect Mult Living                 Review of Systems  Constitutional: Negative for activity change and appetite change.  HENT: Negative for congestion and rhinorrhea.   Respiratory: Positive for chest tightness and shortness of breath. Negative for cough.   Cardiovascular: Negative for chest pain and leg swelling.  Gastrointestinal: Negative  for nausea, vomiting and abdominal pain.  Genitourinary: Negative for dysuria and hematuria.  Musculoskeletal: Positive for myalgias and arthralgias.  Neurological: Negative for dizziness, weakness and headaches.  A complete 10 system review of systems was obtained and all systems are negative except as noted in the HPI and PMH.    Allergies  Lodine  Home Medications   Current Outpatient Rx  Name  Route  Sig  Dispense  Refill  . aspirin-sod bicarb-citric acid (ALKA-SELTZER) 325 MG TBEF tablet   Oral   Take 325 mg by mouth once as needed (for sinus problems).          . clonazePAM (KLONOPIN) 0.5 MG tablet   Oral   Take 0.5 mg by mouth 2 (two) times daily as needed for anxiety.          . cloNIDine (CATAPRES) 0.1 MG tablet   Oral   Take 0.1 mg by mouth every 8 (eight) hours as needed (anxiety).          . cyclobenzaprine (FLEXERIL) 10 MG tablet   Oral   Take 10 mg by mouth 4 (four) times daily as needed for muscle spasms.          . furosemide (LASIX) 20 MG tablet   Oral   Take 20 mg by mouth daily.         Marland Kitchen gabapentin (NEURONTIN) 800 MG tablet   Oral   Take 800 mg by mouth 4 (four) times daily.          Marland Kitchen HYDROmorphone HCl (EXALGO) 12 MG T24A   Oral   Take 12 mg by mouth daily as needed (pain).         . Oxycodone HCl 10 MG TABS   Oral   Take 20 mg by mouth every 6 (six) hours as needed (for pain).         . Rivaroxaban (XARELTO) 20 MG TABS tablet   Oral   Take 20 mg by mouth daily with supper.         . Simeprevir Sodium (OLYSIO) 150 MG CAPS   Oral   Take 150 mg by mouth at bedtime.          . Sofosbuvir (SOVALDI) 400 MG TABS   Oral   Take 400 mg by mouth at bedtime.         . Tapentadol HCl 100 MG TABS   Oral   Take 200 mg by mouth 3 (three) times daily as needed (for pain).           BP 128/78  Pulse 78  Temp(Src) 98.3 F (36.8 C) (Oral)  Resp 20  SpO2 98%  LMP 10/16/2011 Physical Exam  Constitutional: She is oriented to person, place, and time. She appears well-developed and well-nourished. No distress.  Tearful, anxious  HENT:  Head: Normocephalic.  Mouth/Throat: Oropharynx is clear and moist. No oropharyngeal exudate.  Eyes: Conjunctivae and EOM are normal. Pupils are equal, round, and reactive to light.  Neck: Normal range of motion. Neck supple.  Cardiovascular: Normal rate, regular rhythm and normal heart sounds.   No murmur heard. Pulmonary/Chest: Effort normal and breath sounds normal. No respiratory distress.  Abdominal: Soft. There is no tenderness. There is no rebound and no  guarding.  Musculoskeletal: Normal range of motion. She exhibits tenderness. She exhibits no edema.  Lower extremities diffusely tender to palpation. +2 DP,PT pulses bilaterally, no calf tenderness.  Neurological: She is alert and oriented to person, place, and time.  No cranial nerve deficit. She exhibits normal muscle tone. Coordination normal.  Skin: Skin is warm.    ED Course  Procedures (including critical care time) Labs Review Labs Reviewed  CBC WITH DIFFERENTIAL - Abnormal; Notable for the following:    Hemoglobin 15.5 (*)    MCHC 37.3 (*)    Neutrophils Relative % 27 (*)    Neutro Abs 1.6 (*)    Lymphocytes Relative 65 (*)    All other components within normal limits  COMPREHENSIVE METABOLIC PANEL - Abnormal; Notable for the following:    Total Protein 8.4 (*)    Alkaline Phosphatase 145 (*)    All other components within normal limits  TROPONIN I   Imaging Review Dg Chest 2 View  07/11/2013   *RADIOLOGY REPORT*  Clinical Data: Cramping in chest today.  Nausea.  CHEST - 2 VIEW  Comparison: 03/24/2013.  Findings:  Cardiopericardial silhouette within normal limits. Mediastinal contours normal. Trachea midline.  No airspace disease or effusion.  Pulmonary nodules seen on recent chest CT are poorly resolved radiographically.  IMPRESSION: No active cardiopulmonary disease.   Original Report Authenticated By: Andreas Newport, M.D.   Ct Angio Chest Pe W/cm &/or Wo Cm  07/11/2013   *RADIOLOGY REPORT*  Clinical Data: Chest pain and leg cramps.  CT ANGIOGRAPHY CHEST  Technique:  Multidetector CT imaging of the chest using the standard protocol during bolus administration of intravenous contrast. Multiplanar reconstructed images including MIPs were obtained and reviewed to evaluate the vascular anatomy.  Contrast:  100 ml Omnipaque 3-D IV  Comparison: 03/24/2013  Findings: The pulmonary arteries are well opacified and there is no evidence of pulmonary embolism.  The thoracic aorta is of  normal caliber.  Lung windows show no evidence of pulmonary infiltrates or edema.  No nodules or enlarged lymph nodes are seen.  There is no evidence of pleural or pericardial fluid.  Heart size is normal. The visualized airway shows no abnormalities.  The bony thorax is unremarkable.  IMPRESSION: Normal CTA of the chest.  No evidence of pulmonary embolism.   Original Report Authenticated By: Irish Lack, M.D.    MDM   1. Right leg pain   2. RSD (reflex sympathetic dystrophy)   3. Atypical chest pain    History of RSV with worsening leg cramps and transient chest pain, now resolved. Pain lasted 3 minutes and is now gone. No shortness of breath. States compliance with xarelto.  Chest x-ray is negative. Dopplers negative for DVT bilaterally. Patient's chest pain is resolved atypical for ACS as it lasted only 3 minutes. EKG is normal sinus rhythm. Patient's lower extremity pain is treated. She is neurovascularly intact.  CT negative for PE. Suspect her lower extremity pain is from her RSD versus chronic pain. No evidence of acute thrombus. Chest pain has resolved and is atypical for ACS or PE.   Date: 07/11/2013  Rate: 89  Rhythm: normal sinus rhythm  QRS Axis: normal  Intervals: normal  ST/T Wave abnormalities: normal  Conduction Disutrbances:none  Narrative Interpretation:   Old EKG Reviewed: unchanged    Glynn Octave, MD 07/11/13 1811

## 2013-07-11 NOTE — ED Notes (Signed)
Patient transported to CT 

## 2013-07-11 NOTE — ED Notes (Addendum)
Pt brought in by EMS. Pt was in the bath this morning, when she started having a cramp in her right leg. Pt bent over to massage the leg, and started to having a cramp under her left breast, which she states lasted 3 mins. Pt rated pain as 8/10. Pt has hx of DVT in the right leg. Pt states she took 2 oxycodone tablets around 0500 this AM.

## 2013-10-05 ENCOUNTER — Other Ambulatory Visit: Payer: Self-pay | Admitting: Family Medicine

## 2013-10-05 ENCOUNTER — Other Ambulatory Visit: Payer: 59

## 2013-10-05 DIAGNOSIS — R109 Unspecified abdominal pain: Secondary | ICD-10-CM

## 2013-10-05 DIAGNOSIS — K7460 Unspecified cirrhosis of liver: Secondary | ICD-10-CM

## 2013-10-05 DIAGNOSIS — R140 Abdominal distension (gaseous): Secondary | ICD-10-CM

## 2013-10-14 ENCOUNTER — Ambulatory Visit
Admission: RE | Admit: 2013-10-14 | Discharge: 2013-10-14 | Disposition: A | Discharge status: Home / Self Care | Payer: 59 | Source: Ambulatory Visit | Attending: Family Medicine | Admitting: Family Medicine

## 2013-10-14 DIAGNOSIS — K7460 Unspecified cirrhosis of liver: Secondary | ICD-10-CM

## 2013-10-14 DIAGNOSIS — R109 Unspecified abdominal pain: Secondary | ICD-10-CM

## 2013-10-14 DIAGNOSIS — R140 Abdominal distension (gaseous): Secondary | ICD-10-CM

## 2013-11-11 DIAGNOSIS — I82409 Acute embolism and thrombosis of unspecified deep veins of unspecified lower extremity: Secondary | ICD-10-CM

## 2013-11-11 HISTORY — DX: Acute embolism and thrombosis of unspecified deep veins of unspecified lower extremity: I82.409

## 2013-11-16 ENCOUNTER — Emergency Department (HOSPITAL_BASED_OUTPATIENT_CLINIC_OR_DEPARTMENT_OTHER): Payer: 59

## 2013-11-16 ENCOUNTER — Emergency Department (HOSPITAL_BASED_OUTPATIENT_CLINIC_OR_DEPARTMENT_OTHER)
Admission: EM | Admit: 2013-11-16 | Discharge: 2013-11-16 | Disposition: A | Discharge status: Home / Self Care | Payer: 59 | Attending: Emergency Medicine | Admitting: Emergency Medicine

## 2013-11-16 ENCOUNTER — Encounter (HOSPITAL_BASED_OUTPATIENT_CLINIC_OR_DEPARTMENT_OTHER): Payer: Self-pay | Admitting: Emergency Medicine

## 2013-11-16 DIAGNOSIS — Z8619 Personal history of other infectious and parasitic diseases: Secondary | ICD-10-CM | POA: Insufficient documentation

## 2013-11-16 DIAGNOSIS — G8929 Other chronic pain: Secondary | ICD-10-CM | POA: Insufficient documentation

## 2013-11-16 DIAGNOSIS — G43909 Migraine, unspecified, not intractable, without status migrainosus: Secondary | ICD-10-CM | POA: Insufficient documentation

## 2013-11-16 DIAGNOSIS — R011 Cardiac murmur, unspecified: Secondary | ICD-10-CM | POA: Insufficient documentation

## 2013-11-16 DIAGNOSIS — F410 Panic disorder [episodic paroxysmal anxiety] without agoraphobia: Secondary | ICD-10-CM | POA: Insufficient documentation

## 2013-11-16 DIAGNOSIS — Z87891 Personal history of nicotine dependence: Secondary | ICD-10-CM | POA: Insufficient documentation

## 2013-11-16 DIAGNOSIS — Z8701 Personal history of pneumonia (recurrent): Secondary | ICD-10-CM | POA: Insufficient documentation

## 2013-11-16 DIAGNOSIS — M79609 Pain in unspecified limb: Secondary | ICD-10-CM | POA: Insufficient documentation

## 2013-11-16 DIAGNOSIS — M79605 Pain in left leg: Secondary | ICD-10-CM

## 2013-11-16 DIAGNOSIS — Z86718 Personal history of other venous thrombosis and embolism: Secondary | ICD-10-CM | POA: Insufficient documentation

## 2013-11-16 DIAGNOSIS — Z79899 Other long term (current) drug therapy: Secondary | ICD-10-CM | POA: Insufficient documentation

## 2013-11-16 DIAGNOSIS — IMO0001 Reserved for inherently not codable concepts without codable children: Secondary | ICD-10-CM | POA: Insufficient documentation

## 2013-11-16 DIAGNOSIS — Z7901 Long term (current) use of anticoagulants: Secondary | ICD-10-CM | POA: Insufficient documentation

## 2013-11-16 DIAGNOSIS — Z8709 Personal history of other diseases of the respiratory system: Secondary | ICD-10-CM | POA: Insufficient documentation

## 2013-11-16 DIAGNOSIS — G2581 Restless legs syndrome: Secondary | ICD-10-CM | POA: Insufficient documentation

## 2013-11-16 DIAGNOSIS — Z8673 Personal history of transient ischemic attack (TIA), and cerebral infarction without residual deficits: Secondary | ICD-10-CM | POA: Insufficient documentation

## 2013-11-16 MED ORDER — OXYCODONE-ACETAMINOPHEN 5-325 MG PO TABS
2.0000 | ORAL_TABLET | Freq: Once | ORAL | Status: AC
Start: 2013-11-16 — End: 2013-11-16
  Administered 2013-11-16: 2 via ORAL
  Filled 2013-11-16: qty 2

## 2013-11-16 MED ORDER — ONDANSETRON 4 MG PO TBDP
4.0000 mg | ORAL_TABLET | Freq: Once | ORAL | Status: AC
  Administered 2013-11-16: 4 mg via ORAL
  Filled 2013-11-16: qty 1

## 2013-11-16 NOTE — ED Provider Notes (Signed)
CSN: [id number]     Arrival date & time 11/16/13  1339 History   First MD Initiated Contact with Patient 11/16/13 1516     Chief Complaint  Patient presents with  . Leg Pain   (Consider location/radiation/quality/duration/timing/severity/associated sxs/prior Treatment) HPI Comments: Patient presents with pain to her left leg. She has a history of a DVT in the right leg about one year ago. She's been on several to for about the last 11 months but stopped it 3 weeks ago or an upcoming dental procedure. She states over the last 2 days she's had pain to the posterior aspect of her left leg. She does have a history of RSD on the right and takes chronic pain medications for that but she hasn't had prior problems in the left leg. She denies any numbness in the leg. She denies any swelling or weakness to her leg. She denies any fevers or chills. There's no shortness of breath or chest pain. She denies any injuries to her leg.  Patient is a 53 y.o. female presenting with leg pain.  Leg Pain Associated symptoms: no back pain, no fatigue and no fever     Past Medical History  Diagnosis Date  . Multiple sclerosis     dx. -37yrs ago, then med stopped-due to liver enzymes changes,occ. periods of weakness in arms"drops things"  . Heart murmur     Dr. Etter Sjogren  . Bronchitis     hx of  . TIA (transient ischemic attack)     hx of (weakness left side)-none in 1 yr  . Chronic neck pain   . Blood transfusion without reported diagnosis     20 yrs ago after childbirth  . DVT (deep venous thrombosis)     rt. leg   . Anxiety 06-16-13    hx. panic attacks, none recent  . Headache(784.0)     migraines-has decreased  . Hepatitis C     dx. Hep. C(s/p transfusion age 75) low Hgb  .-multiple transfusions.  . RSD (reflex sympathetic dystrophy) 06-16-13    legs  . Pneumonia     hx of , ? mild emphysema on CT scan 5'14, multiple pulmonary nodules   Past Surgical History  Procedure Laterality Date  . Neck surgery     . Cesarean section    . Abdominal hysterectomy    . Appendectomy    . Wedge resection      left ovary with cyst removed ,hx. multiple cysts removed prior  . Anterior cervical decomp/discectomy fusion  09/21/2012    Procedure: ANTERIOR CERVICAL DECOMPRESSION/DISCECTOMY FUSION 2 LEVELS;  Surgeon: Otilio Connors, MD;  Location: [facility];  Service: Neurosurgery;  Laterality: N/A;  Cervical five-six, cervical seven-thoracic one Anterior cervical decompression/diskectomy/fusion/Lifenet bone/trestle plate/Prior Cervical six--seven Anterior cervical fusion/Removing trestle plate  . Ankle surgery Right     no retained hardware  . Esophagogastroduodenoscopy (egd) with propofol N/A 06/30/2013    Procedure: ESOPHAGOGASTRODUODENOSCOPY (EGD) WITH PROPOFOL;  Surgeon: Arta Silence, MD;  Location: [facility];  Service: Endoscopy;  Laterality: N/A;   No family history on file. History  Substance Use Topics  . Smoking status: Former Smoker -- 0.25 packs/day for 20 years    Quit date: 02/14/2013  . Smokeless tobacco: Never Used  . Alcohol Use: No   OB History   Grav Para Term Preterm Abortions TAB SAB Ect Mult Living                 Review of Systems  Constitutional:  Negative for fever, chills, diaphoresis and fatigue.  HENT: Negative for congestion, rhinorrhea and sneezing.   Eyes: Negative.   Respiratory: Negative for cough, chest tightness and shortness of breath.   Cardiovascular: Negative for chest pain and leg swelling.  Gastrointestinal: Negative for nausea, vomiting, abdominal pain, diarrhea and blood in stool.  Genitourinary: Negative for frequency, hematuria, flank pain and difficulty urinating.  Musculoskeletal: Positive for myalgias. Negative for arthralgias and back pain.  Skin: Negative for rash.  Neurological: Negative for dizziness, speech difficulty, weakness, numbness and headaches.    Allergies  Lodine  Home Medications   Current Outpatient Rx  Name  Route  Sig   Dispense  Refill  . aspirin-sod bicarb-citric acid (ALKA-SELTZER) 325 MG TBEF tablet   Oral   Take 325 mg by mouth once as needed (for sinus problems).         . clonazePAM (KLONOPIN) 0.5 MG tablet   Oral   Take 0.5 mg by mouth 2 (two) times daily as needed for anxiety.          . cloNIDine (CATAPRES) 0.1 MG tablet   Oral   Take 0.1 mg by mouth every 8 (eight) hours as needed (anxiety).          . cyclobenzaprine (FLEXERIL) 10 MG tablet   Oral   Take 10 mg by mouth 4 (four) times daily as needed for muscle spasms.          . furosemide (LASIX) 20 MG tablet   Oral   Take 20 mg by mouth daily.         Marland Kitchen gabapentin (NEURONTIN) 800 MG tablet   Oral   Take 800 mg by mouth 4 (four) times daily.          Marland Kitchen HYDROmorphone HCl (EXALGO) 12 MG T24A   Oral   Take 12 mg by mouth daily as needed (pain).         . Oxycodone HCl 10 MG TABS   Oral   Take 20 mg by mouth every 6 (six) hours as needed (for pain).         . Rivaroxaban (XARELTO) 20 MG TABS tablet   Oral   Take 20 mg by mouth daily with supper.         . Simeprevir Sodium (OLYSIO) 150 MG CAPS   Oral   Take 150 mg by mouth at bedtime.          . Sofosbuvir (SOVALDI) 400 MG TABS   Oral   Take 400 mg by mouth at bedtime.         . Tapentadol HCl 100 MG TABS   Oral   Take 200 mg by mouth 3 (three) times daily as needed (for pain).           BP 112/63  Pulse 80  Temp(Src) 98.1 F (36.7 C) (Oral)  Resp 16  Wt 167 lb (75.751 kg)  SpO2 99%  LMP 10/16/2011 Physical Exam  Constitutional: She is oriented to person, place, and time. She appears well-developed and well-nourished.  HENT:  Head: Normocephalic and atraumatic.  Eyes: Pupils are equal, round, and reactive to light.  Neck: Normal range of motion. Neck supple.  Cardiovascular: Normal rate, regular rhythm and normal heart sounds.   Pulmonary/Chest: Effort normal and breath sounds normal. No respiratory distress. She has no wheezes. She  has no rales. She exhibits no tenderness.  Abdominal: Soft. Bowel sounds are normal. There is no tenderness. There is no rebound  and no guarding.  Musculoskeletal: Normal range of motion. She exhibits no edema.  Positive tenderness to the posterior calf. There is no pain on range of motion and palpation of the knee hip or ankle. Pedal pulses are intact. She has normal sensation to the foot. Normal motor function in the leg.  Lymphadenopathy:    She has no cervical adenopathy.  Neurological: She is alert and oriented to person, place, and time.  Skin: Skin is warm and dry. No rash noted.  Psychiatric: She has a normal mood and affect.    ED Course  Procedures (including critical care time) Labs Review Labs Reviewed - No data to display Imaging Review US Venous Img Lower Unilateral Left  11/16/2013   CLINICAL DATA:  Left calf pain and swelling  EXAM: Left LOWER EXTREMITY VENOUS DOPPLER ULTRASOUND  TECHNIQUE: Gray-scale sonography with graded compression, as well as color Doppler and duplex ultrasound, were performed to evaluate the deep venous system from the level of the common femoral vein through the popliteal and proximal calf veins. Spectral Doppler was utilized to evaluate flow at rest and with distal augmentation maneuvers.  COMPARISON:  None.  FINDINGS: Thrombus within deep veins:  None visualized.  Compressibility of deep veins:  Normal.  Duplex waveform respiratory phasicity:  Normal.  Duplex waveform response to augmentation:  Normal.  Venous reflux:  None visualized.  Other findings: A hypoechoic structure is noted in the popliteal fossa measuring 3.7 cm in greatest dimension. This likely represents a mildly complicated popliteal cyst.  IMPRESSION: No evidence of deep venous thrombosis.  Likely popliteal cyst.   Electronically Signed   By: Inez Catalina M.D.   On: 11/16/2013 16:24    EKG Interpretation   None       MDM   1. Leg pain, left    Patient with pain to the posterior  aspect of left leg. There is no evidence of DVT. I did advise her of the pain continues it's prudent to repeat ultrasound in about one week. I encouraged her followup with her primary care physician. She's on chronic pain medications at home so I did not prescribe her anything else this.    Malvin Johns, MD 11/16/13 [phone]

## 2013-11-16 NOTE — ED Notes (Signed)
Left leg pain and swelling all night. Hx of blood clot in her right leg.

## 2013-11-16 NOTE — Discharge Instructions (Signed)
Musculoskeletal Pain °Musculoskeletal pain is muscle and boney aches and pains. These pains can occur in any part of the body. Your caregiver may treat you without knowing the cause of the pain. They may treat you if blood or urine tests, X-rays, and other tests were normal.  °CAUSES °There is often not a definite cause or reason for these pains. These pains may be caused by a type of germ (virus). The discomfort may also come from overuse. Overuse includes working out too hard when your body is not fit. Boney aches also come from weather changes. Bone is sensitive to atmospheric pressure changes. °HOME CARE INSTRUCTIONS  °· Ask when your test results will be ready. Make sure you get your test results. °· Only take over-the-counter or prescription medicines for pain, discomfort, or fever as directed by your caregiver. If you were given medications for your condition, do not drive, operate machinery or power tools, or sign legal documents for 24 hours. Do not drink alcohol. Do not take sleeping pills or other medications that may interfere with treatment. °· Continue all activities unless the activities cause more pain. When the pain lessens, slowly resume normal activities. Gradually increase the intensity and duration of the activities or exercise. °· During periods of severe pain, bed rest may be helpful. Lay or sit in any position that is comfortable. °· Putting ice on the injured area. °· Put ice in a bag. °· Place a towel between your skin and the bag. °· Leave the ice on for 15 to 20 minutes, 3 to 4 times a day. °· Follow up with your caregiver for continued problems and no reason can be found for the pain. If the pain becomes worse or does not go away, it may be necessary to repeat tests or do additional testing. Your caregiver may need to look further for a possible cause. °SEEK IMMEDIATE MEDICAL CARE IF: °· You have pain that is getting worse and is not relieved by medications. °· You develop chest pain  that is associated with shortness or breath, sweating, feeling sick to your stomach (nauseous), or throw up (vomit). °· Your pain becomes localized to the abdomen. °· You develop any new symptoms that seem different or that concern you. °MAKE SURE YOU:  °· Understand these instructions. °· Will watch your condition. °· Will get help right away if you are not doing well or get worse. °Document Released: 10/28/2005 Document Revised: 01/20/2012 Document Reviewed: 06/17/2008 °[employer]® Patient Information ©2014 [employer]. ° °

## 2013-11-27 ENCOUNTER — Other Ambulatory Visit: Payer: Self-pay

## 2013-11-27 ENCOUNTER — Emergency Department (HOSPITAL_COMMUNITY)
Admission: EM | Admit: 2013-11-27 | Discharge: 2013-11-28 | Disposition: A | Discharge status: Home / Self Care | Payer: 59 | Attending: Emergency Medicine | Admitting: Emergency Medicine

## 2013-11-27 ENCOUNTER — Encounter (HOSPITAL_COMMUNITY): Payer: Self-pay | Admitting: Emergency Medicine

## 2013-11-27 DIAGNOSIS — Z87891 Personal history of nicotine dependence: Secondary | ICD-10-CM | POA: Insufficient documentation

## 2013-11-27 DIAGNOSIS — F411 Generalized anxiety disorder: Secondary | ICD-10-CM | POA: Insufficient documentation

## 2013-11-27 DIAGNOSIS — Z8619 Personal history of other infectious and parasitic diseases: Secondary | ICD-10-CM | POA: Insufficient documentation

## 2013-11-27 DIAGNOSIS — M79604 Pain in right leg: Secondary | ICD-10-CM

## 2013-11-27 DIAGNOSIS — Z79899 Other long term (current) drug therapy: Secondary | ICD-10-CM | POA: Insufficient documentation

## 2013-11-27 DIAGNOSIS — G90529 Complex regional pain syndrome I of unspecified lower limb: Secondary | ICD-10-CM | POA: Insufficient documentation

## 2013-11-27 DIAGNOSIS — G8929 Other chronic pain: Secondary | ICD-10-CM | POA: Insufficient documentation

## 2013-11-27 DIAGNOSIS — R079 Chest pain, unspecified: Secondary | ICD-10-CM | POA: Insufficient documentation

## 2013-11-27 DIAGNOSIS — G9050 Complex regional pain syndrome I, unspecified: Secondary | ICD-10-CM

## 2013-11-27 DIAGNOSIS — Z8673 Personal history of transient ischemic attack (TIA), and cerebral infarction without residual deficits: Secondary | ICD-10-CM | POA: Insufficient documentation

## 2013-11-27 DIAGNOSIS — Z7901 Long term (current) use of anticoagulants: Secondary | ICD-10-CM | POA: Insufficient documentation

## 2013-11-27 DIAGNOSIS — Z8701 Personal history of pneumonia (recurrent): Secondary | ICD-10-CM | POA: Insufficient documentation

## 2013-11-27 DIAGNOSIS — M79609 Pain in unspecified limb: Secondary | ICD-10-CM | POA: Insufficient documentation

## 2013-11-27 DIAGNOSIS — R011 Cardiac murmur, unspecified: Secondary | ICD-10-CM | POA: Insufficient documentation

## 2013-11-27 DIAGNOSIS — Z86718 Personal history of other venous thrombosis and embolism: Secondary | ICD-10-CM | POA: Insufficient documentation

## 2013-11-27 DIAGNOSIS — Z8709 Personal history of other diseases of the respiratory system: Secondary | ICD-10-CM | POA: Insufficient documentation

## 2013-11-27 LAB — CBC
HCT: 37.1 % (ref 36.0–46.0)
HEMOGLOBIN: 13.9 g/dL (ref 12.0–15.0)
MCH: 32.2 pg (ref 26.0–34.0)
MCHC: 37.5 g/dL — AB (ref 30.0–36.0)
MCV: 85.9 fL (ref 78.0–100.0)
Platelets: 155 10*3/uL (ref 150–400)
RBC: 4.32 MIL/uL (ref 3.87–5.11)
RDW: 12.2 % (ref 11.5–15.5)
WBC: 5.1 10*3/uL (ref 4.0–10.5)

## 2013-11-27 LAB — BASIC METABOLIC PANEL
BUN: 12 mg/dL (ref 6–23)
CALCIUM: 9.2 mg/dL (ref 8.4–10.5)
CO2: 23 mEq/L (ref 19–32)
Chloride: 101 mEq/L (ref 96–112)
Creatinine, Ser: 0.78 mg/dL (ref 0.50–1.10)
GLUCOSE: 105 mg/dL — AB (ref 70–99)
Potassium: 3.8 mEq/L (ref 3.7–5.3)
SODIUM: 140 meq/L (ref 137–147)

## 2013-11-27 LAB — PROTIME-INR
INR: 1.96 — ABNORMAL HIGH (ref 0.00–1.49)
Prothrombin Time: 21.7 seconds — ABNORMAL HIGH (ref 11.6–15.2)

## 2013-11-27 LAB — PRO B NATRIURETIC PEPTIDE: Pro B Natriuretic peptide (BNP): <5 pg/mL (ref 0–125)

## 2013-11-27 LAB — TROPONIN I

## 2013-11-27 MED ORDER — MORPHINE SULFATE 10 MG/ML IJ SOLN
10.0000 mg | Freq: Once | INTRAMUSCULAR | Status: AC
Start: 2013-11-27 — End: 2013-11-27
  Administered 2013-11-27: 10 mg via INTRAMUSCULAR
  Filled 2013-11-27: qty 1

## 2013-11-27 NOTE — ED Notes (Signed)
Patient presents sitting in wheelchair with c/o right leg pain.  States she knows it is a blood clot.  Stopped her Alver Sorrow to have some dental procedure done (back in December) and knows that it is a blood clot.  Also c/o chest pain that comes and goes and patient is hyperventelating at times.

## 2013-11-27 NOTE — ED Notes (Signed)
Introduced myself to the patient and family.  MD at the bedside.

## 2013-11-27 NOTE — ED Notes (Signed)
Sitting in the chair sometimes hyperventelating and sometimes seems SOB

## 2013-11-27 NOTE — ED Provider Notes (Signed)
CSN: [id number]     Arrival date & time 11/27/13  2200 History   First MD Initiated Contact with Patient 11/27/13 2310     Chief Complaint  Patient presents with  . Leg Pain  . Chest Pain   (Consider location/radiation/quality/duration/timing/severity/associated sxs/prior Treatment) HPI This patient is a 53 yo woman with chronic pain secondary to reflex sympathetic dystrophy. She is followed by Dr. Mechele Dawley at a local pain clinic and is treated with hydromorphone.  She also has a history of DVT and, for this, takes Xarelto.   She is here with c/o 2 days of severe right leg pain. She says that this pain is similar but more severe than her usual bouts of pain related to RSD.  She notes multiple times during our conversation, "I can handle pain very well". She says she has been taking her usual dose of hydromorphone without adequate relief and feels that she needs to be admitted for pain management.   The patient denies any motor weakness and leg. She is concerned that she may have a recurrent deep venous thrombosis. She notes that she was off the relative oh for 3 weeks during December but restarted this medication 2 weeks ago. She feels that she needs an emergent ultrasound to rule out DVT.  Past Medical History  Diagnosis Date  . Multiple sclerosis     dx. -4yrs ago, then med stopped-due to liver enzymes changes,occ. periods of weakness in arms"drops things"  . Heart murmur     Dr. Etter Sjogren  . Bronchitis     hx of  . TIA (transient ischemic attack)     hx of (weakness left side)-none in 1 yr  . Chronic neck pain   . Blood transfusion without reported diagnosis     20 yrs ago after childbirth  . DVT (deep venous thrombosis)     rt. leg   . Anxiety 06-16-13    hx. panic attacks, none recent  . Headache(784.0)     migraines-has decreased  . Hepatitis C     dx. Hep. C(s/p transfusion age 64) low Hgb  .-multiple transfusions.  . RSD (reflex sympathetic dystrophy) 06-16-13    legs  .  Pneumonia     hx of , ? mild emphysema on CT scan 5'14, multiple pulmonary nodules   Past Surgical History  Procedure Laterality Date  . Neck surgery    . Cesarean section    . Abdominal hysterectomy    . Appendectomy    . Wedge resection      left ovary with cyst removed ,hx. multiple cysts removed prior  . Anterior cervical decomp/discectomy fusion  09/21/2012    Procedure: ANTERIOR CERVICAL DECOMPRESSION/DISCECTOMY FUSION 2 LEVELS;  Surgeon: Otilio Connors, MD;  Location: [facility];  Service: Neurosurgery;  Laterality: N/A;  Cervical five-six, cervical seven-thoracic one Anterior cervical decompression/diskectomy/fusion/Lifenet bone/trestle plate/Prior Cervical six--seven Anterior cervical fusion/Removing trestle plate  . Ankle surgery Right     no retained hardware  . Esophagogastroduodenoscopy (egd) with propofol N/A 06/30/2013    Procedure: ESOPHAGOGASTRODUODENOSCOPY (EGD) WITH PROPOFOL;  Surgeon: Arta Silence, MD;  Location: [facility];  Service: Endoscopy;  Laterality: N/A;   History reviewed. No pertinent family history. History  Substance Use Topics  . Smoking status: Former Smoker -- 0.25 packs/day for 20 years    Quit date: 02/14/2013  . Smokeless tobacco: Never Used  . Alcohol Use: No   OB History   Grav Para Term Preterm Abortions TAB SAB Ect Mult Living  Review of Systems Ten point review of symptoms performed and is negative with the exception of symptoms noted above. In addition, the patient has chronic back pain  Allergies  Lodine  Home Medications   Current Outpatient Rx  Name  Route  Sig  Dispense  Refill  . clonazePAM (KLONOPIN) 0.5 MG tablet   Oral   Take 0.5 mg by mouth 2 (two) times daily as needed for anxiety.          . cloNIDine (CATAPRES) 0.1 MG tablet   Oral   Take 0.1 mg by mouth every 8 (eight) hours as needed (anxiety).          . cyclobenzaprine (FLEXERIL) 10 MG tablet   Oral   Take 10 mg by mouth 4 (four)  times daily as needed for muscle spasms.          . furosemide (LASIX) 20 MG tablet   Oral   Take 20 mg by mouth daily.         Marland Kitchen gabapentin (NEURONTIN) 800 MG tablet   Oral   Take 800 mg by mouth 3 (three) times daily.          Marland Kitchen HYDROmorphone HCl (EXALGO) 12 MG T24A   Oral   Take 12 mg by mouth daily as needed (pain).         . ondansetron (ZOFRAN) 4 MG tablet   Oral   Take 4 mg by mouth every 8 (eight) hours as needed for nausea or vomiting.         . Rivaroxaban (XARELTO) 20 MG TABS tablet   Oral   Take 20 mg by mouth daily with supper.         . Tapentadol HCl 100 MG TABS   Oral   Take 200 mg by mouth 3 (three) times daily as needed (for pain).           BP 153/74  Pulse 80  Temp(Src) 98.7 F (37.1 C) (Oral)  Resp 22  Ht 5\' 1"  (1.549 m)  Wt 150 lb (68.04 kg)  BMI 28.36 kg/m2  SpO2 98%  LMP 10/16/2011 Physical Exam Gen: well developed and well nourished appearing Head: NCAT Eyes: PERL, EOMI Nose: no epistaixis or rhinorrhea Mouth/throat: mucosa is moist and pink Neck: supple, no stridor Lungs: CTA B, no wheezing, rhonchi or rales CV: RRR, no murmur, extremities appear well perfused.  Abd: soft, notender, nondistended Back: no ttp, no cva ttp Skin: warm and dry Ext: normal to inspection, no dependent edema, there are symmetric pulses both DPs and popliteal pulses, cap refill < 2s both feet, both feet warm, no skin changes, no edema. The right calf and right foot and ankle are diffusely and exquisitely tender to palpation. No focal lesions.  Neuro: CN ii-xii grossly intact, no focal deficits Psyche; anxious affect,  calm and cooperative.   ED Course  Procedures (including critical care time) Labs Review Results for orders placed during the hospital encounter of 11/27/13 (from the past 24 hour(s))  CBC     Status: Abnormal   Collection Time    11/27/13 10:14 PM      Result Value Range   WBC 5.1  4.0 - 10.5 K/uL   RBC 4.32  3.87 - 5.11  MIL/uL   Hemoglobin 13.9  12.0 - 15.0 g/dL   HCT 37.1  36.0 - 46.0 %   MCV 85.9  78.0 - 100.0 fL   MCH 32.2  26.0 - 34.0 pg  MCHC 37.5 (*) 30.0 - 36.0 g/dL   RDW 12.2  11.5 - 15.5 %   Platelets 155  150 - 400 K/uL  BASIC METABOLIC PANEL     Status: Abnormal   Collection Time    11/27/13 10:14 PM      Result Value Range   Sodium 140  137 - 147 mEq/L   Potassium 3.8  3.7 - 5.3 mEq/L   Chloride 101  96 - 112 mEq/L   CO2 23  19 - 32 mEq/L   Glucose, Bld 105 (*) 70 - 99 mg/dL   BUN 12  6 - 23 mg/dL   Creatinine, Ser 0.78  0.50 - 1.10 mg/dL   Calcium 9.2  8.4 - 10.5 mg/dL   GFR calc non Af Amer >90  >90 mL/min   GFR calc Af Amer >90  >90 mL/min  PRO B NATRIURETIC PEPTIDE     Status: None   Collection Time    11/27/13 10:17 PM      Result Value Range   Pro B Natriuretic peptide (BNP) <5.0  0 - 125 pg/mL  TROPONIN I     Status: None   Collection Time    11/27/13 10:17 PM      Result Value Range   Troponin I <0.30  <0.30 ng/mL    MDM   Acute exccerbation of chronic right leg pain with concern for possible recurrent DVT. The patient is already anticoagulated on Xarelto. I have explained to her that we do not have capacity to get her an ultrasound immediately but, that we can schedule one for her early in the morning. We will tx patient's acute pain in the ED and she will be discharged. No concern for acute limb ischemia.    Elyn Peers, MD 11/27/13 2352

## 2013-11-27 NOTE — ED Notes (Signed)
Dr Manly at bedside 

## 2013-11-28 ENCOUNTER — Ambulatory Visit (HOSPITAL_COMMUNITY)
Admission: RE | Admit: 2013-11-28 | Discharge: 2013-11-28 | Disposition: A | Discharge status: Home / Self Care | Payer: 59 | Source: Ambulatory Visit | Attending: Emergency Medicine | Admitting: Emergency Medicine

## 2013-11-28 ENCOUNTER — Ambulatory Visit (HOSPITAL_COMMUNITY): Payer: 59

## 2013-11-28 DIAGNOSIS — M79604 Pain in right leg: Secondary | ICD-10-CM

## 2013-11-28 DIAGNOSIS — O2230 Deep phlebothrombosis in pregnancy, unspecified trimester: Secondary | ICD-10-CM

## 2013-11-28 DIAGNOSIS — G90529 Complex regional pain syndrome I of unspecified lower limb: Secondary | ICD-10-CM

## 2013-11-28 DIAGNOSIS — M79609 Pain in unspecified limb: Secondary | ICD-10-CM | POA: Insufficient documentation

## 2013-11-28 NOTE — ED Notes (Signed)
Patient is alert and orientedx4.  Patient was explained discharge instructions and they understood them with no questions.  The patient's husband, Chanel Mcadams is taking the patient home.

## 2013-11-28 NOTE — Progress Notes (Signed)
VASCULAR LAB PRELIMINARY  PRELIMINARY  PRELIMINARY  PRELIMINARY  VASCULAR LAB PRELIMINARY  PRELIMINARY  PRELIMINARY  PRELIMINARY  Right lower extremity venous duplex completed.    Preliminary report:  Right:  No evidence of DVT, superficial thrombosis, or Baker's cyst.  Clarence Dunsmore, RVS 11/28/2013, 10:41 AM

## 2014-02-10 ENCOUNTER — Emergency Department (HOSPITAL_COMMUNITY): Payer: 59

## 2014-02-10 ENCOUNTER — Emergency Department (HOSPITAL_COMMUNITY)
Admission: EM | Admit: 2014-02-10 | Discharge: 2014-02-10 | Disposition: A | Discharge status: Home / Self Care | Payer: 59 | Attending: Emergency Medicine | Admitting: Emergency Medicine

## 2014-02-10 ENCOUNTER — Encounter (HOSPITAL_COMMUNITY): Payer: Self-pay | Admitting: Emergency Medicine

## 2014-02-10 DIAGNOSIS — R011 Cardiac murmur, unspecified: Secondary | ICD-10-CM | POA: Insufficient documentation

## 2014-02-10 DIAGNOSIS — G8929 Other chronic pain: Secondary | ICD-10-CM | POA: Insufficient documentation

## 2014-02-10 DIAGNOSIS — F410 Panic disorder [episodic paroxysmal anxiety] without agoraphobia: Secondary | ICD-10-CM | POA: Insufficient documentation

## 2014-02-10 DIAGNOSIS — Z86718 Personal history of other venous thrombosis and embolism: Secondary | ICD-10-CM | POA: Insufficient documentation

## 2014-02-10 DIAGNOSIS — G43909 Migraine, unspecified, not intractable, without status migrainosus: Secondary | ICD-10-CM | POA: Insufficient documentation

## 2014-02-10 DIAGNOSIS — Z8619 Personal history of other infectious and parasitic diseases: Secondary | ICD-10-CM | POA: Insufficient documentation

## 2014-02-10 DIAGNOSIS — R413 Other amnesia: Secondary | ICD-10-CM | POA: Insufficient documentation

## 2014-02-10 DIAGNOSIS — Z8673 Personal history of transient ischemic attack (TIA), and cerebral infarction without residual deficits: Secondary | ICD-10-CM | POA: Insufficient documentation

## 2014-02-10 DIAGNOSIS — Z8701 Personal history of pneumonia (recurrent): Secondary | ICD-10-CM | POA: Insufficient documentation

## 2014-02-10 DIAGNOSIS — Z87891 Personal history of nicotine dependence: Secondary | ICD-10-CM | POA: Insufficient documentation

## 2014-02-10 DIAGNOSIS — R55 Syncope and collapse: Secondary | ICD-10-CM | POA: Insufficient documentation

## 2014-02-10 DIAGNOSIS — Z7901 Long term (current) use of anticoagulants: Secondary | ICD-10-CM | POA: Insufficient documentation

## 2014-02-10 DIAGNOSIS — Z8709 Personal history of other diseases of the respiratory system: Secondary | ICD-10-CM | POA: Insufficient documentation

## 2014-02-10 DIAGNOSIS — G9050 Complex regional pain syndrome I, unspecified: Secondary | ICD-10-CM | POA: Insufficient documentation

## 2014-02-10 DIAGNOSIS — Z79899 Other long term (current) drug therapy: Secondary | ICD-10-CM | POA: Insufficient documentation

## 2014-02-10 LAB — CBC WITH DIFFERENTIAL/PLATELET
Basophils Absolute: 0 10*3/uL (ref 0.0–0.1)
Basophils Relative: 1 % (ref 0–1)
Eosinophils Absolute: 0 10*3/uL (ref 0.0–0.7)
Eosinophils Relative: 1 % (ref 0–5)
HCT: 38.2 % (ref 36.0–46.0)
HEMOGLOBIN: 13.8 g/dL (ref 12.0–15.0)
Lymphocytes Relative: 59 % — ABNORMAL HIGH (ref 12–46)
Lymphs Abs: 2.5 10*3/uL (ref 0.7–4.0)
MCH: 31.5 pg (ref 26.0–34.0)
MCHC: 36.1 g/dL — AB (ref 30.0–36.0)
MCV: 87.2 fL (ref 78.0–100.0)
MONOS PCT: 9 % (ref 3–12)
Monocytes Absolute: 0.4 10*3/uL (ref 0.1–1.0)
NEUTROS PCT: 30 % — AB (ref 43–77)
Neutro Abs: 1.3 10*3/uL — ABNORMAL LOW (ref 1.7–7.7)
Platelets: 161 10*3/uL (ref 150–400)
RBC: 4.38 MIL/uL (ref 3.87–5.11)
RDW: 12.7 % (ref 11.5–15.5)
WBC: 4.2 10*3/uL (ref 4.0–10.5)

## 2014-02-10 LAB — COMPREHENSIVE METABOLIC PANEL
ALK PHOS: 133 U/L — AB (ref 39–117)
ALT: 20 U/L (ref 0–35)
AST: 26 U/L (ref 0–37)
Albumin: 3.7 g/dL (ref 3.5–5.2)
BILIRUBIN TOTAL: 0.6 mg/dL (ref 0.3–1.2)
BUN: 6 mg/dL (ref 6–23)
CHLORIDE: 106 meq/L (ref 96–112)
CO2: 24 mEq/L (ref 19–32)
Calcium: 9.3 mg/dL (ref 8.4–10.5)
Creatinine, Ser: 0.74 mg/dL (ref 0.50–1.10)
GFR calc Af Amer: >90 mL/min (ref >90)
GLUCOSE: 81 mg/dL (ref 70–99)
Potassium: 3.9 mEq/L (ref 3.7–5.3)
Sodium: 142 mEq/L (ref 137–147)
TOTAL PROTEIN: 7.3 g/dL (ref 6.0–8.3)

## 2014-02-10 LAB — PROTIME-INR
INR: 1.57 — AB (ref 0.00–1.49)
Prothrombin Time: 18.3 seconds — ABNORMAL HIGH (ref 11.6–15.2)

## 2014-02-10 LAB — I-STAT TROPONIN, ED: Troponin i, poc: 0.01 ng/mL (ref 0.00–0.08)

## 2014-02-10 LAB — CBG MONITORING, ED: GLUCOSE-CAPILLARY: 74 mg/dL (ref 70–99)

## 2014-02-10 MED ORDER — SODIUM CHLORIDE 0.9 % IV SOLN: 1000.0000 mL | INTRAVENOUS | Status: DC

## 2014-02-10 MED ORDER — SODIUM CHLORIDE 0.9 % IV SOLN: 1000.0000 mL | Freq: Once | INTRAVENOUS | Status: DC

## 2014-02-10 NOTE — ED Notes (Signed)
Patient has not taken her morning meds this am

## 2014-02-10 NOTE — Discharge Instructions (Signed)
Call for a follow up appointment with Dr. Mayra Neer. Call [facility] for further evaluation of your syncopal events. Follow up your neurologist. Return if Symptoms worsen.   Take medication as prescribed.  Make sure you eat a well balanced diet and drink plenty of fluids.

## 2014-02-10 NOTE — ED Notes (Signed)
Lauren Parker, PA at bedside 

## 2014-02-10 NOTE — ED Provider Notes (Signed)
CSN: [id number]     Arrival date & time 02/10/14  1136 History   First MD Initiated Contact with Patient 02/10/14 1149     Chief Complaint  Patient presents with  . Loss of Consciousness     (Consider location/radiation/quality/duration/timing/severity/associated sxs/prior Treatment) HPI Comments: Becky Wolfe is a 53 y.o. female with a past medical history of MS, Reflex, sympathetic dystrophy, heart murmur presenting the Emergency Department with a chief complaint of witnessed syncopal event just prior to arrival.  The patient was sent by her OB/GYN office for further evaluation after a syncopal event.  The patient reports she had just had her blood drawn when she stood up walked a few feet and woke up on the floor.  She reports amnesia to the event.  She denies preceding symptoms.   She reports witness states she hit her head during the event.  Denies bowel or bladder incontinence.  She is unsure how long she was out.  She denies similar episodes in the past.  She reports worsening Right-sided headache. Patient is on Xarelto due to history of DVT.    The history is provided by the patient. No language interpreter was used.    Past Medical History  Diagnosis Date  . Multiple sclerosis     dx. -60yrs ago, then med stopped-due to liver enzymes changes,occ. periods of weakness in arms"drops things"  . Heart murmur     Dr. Etter Sjogren  . Bronchitis     hx of  . TIA (transient ischemic attack)     hx of (weakness left side)-none in 1 yr  . Chronic neck pain   . Blood transfusion without reported diagnosis     20 yrs ago after childbirth  . DVT (deep venous thrombosis)     rt. leg   . Anxiety 06-16-13    hx. panic attacks, none recent  . Headache(784.0)     migraines-has decreased  . Hepatitis C     dx. Hep. C(s/p transfusion age 69) low Hgb  .-multiple transfusions.  . RSD (reflex sympathetic dystrophy) 06-16-13    legs  . Pneumonia     hx of , ? mild emphysema on CT scan 5'14, multiple  pulmonary nodules   Past Surgical History  Procedure Laterality Date  . Neck surgery    . Cesarean section    . Abdominal hysterectomy    . Appendectomy    . Wedge resection      left ovary with cyst removed ,hx. multiple cysts removed prior  . Anterior cervical decomp/discectomy fusion  09/21/2012    Procedure: ANTERIOR CERVICAL DECOMPRESSION/DISCECTOMY FUSION 2 LEVELS;  Surgeon: Otilio Connors, MD;  Location: [facility];  Service: Neurosurgery;  Laterality: N/A;  Cervical five-six, cervical seven-thoracic one Anterior cervical decompression/diskectomy/fusion/Lifenet bone/trestle plate/Prior Cervical six--seven Anterior cervical fusion/Removing trestle plate  . Ankle surgery Right     no retained hardware  . Esophagogastroduodenoscopy (egd) with propofol N/A 06/30/2013    Procedure: ESOPHAGOGASTRODUODENOSCOPY (EGD) WITH PROPOFOL;  Surgeon: Arta Silence, MD;  Location: [facility];  Service: Endoscopy;  Laterality: N/A;   No family history on file. History  Substance Use Topics  . Smoking status: Former Smoker -- 0.25 packs/day for 20 years    Quit date: 02/14/2013  . Smokeless tobacco: Never Used  . Alcohol Use: No   OB History   Grav Para Term Preterm Abortions TAB SAB Ect Mult Living  Review of Systems  Constitutional: Negative for fever and chills.  Eyes: Negative for photophobia and visual disturbance.  Respiratory: Negative for cough and shortness of breath.   Cardiovascular: Negative for chest pain, palpitations and leg swelling.  Gastrointestinal: Negative for nausea, vomiting, abdominal pain and diarrhea.      Allergies  Lodine  Home Medications   Current Outpatient Rx  Name  Route  Sig  Dispense  Refill  . clonazePAM (KLONOPIN) 0.5 MG tablet   Oral   Take 0.5 mg by mouth 2 (two) times daily as needed for anxiety.          . cloNIDine (CATAPRES) 0.1 MG tablet   Oral   Take 0.1 mg by mouth every 8 (eight) hours as needed (anxiety).           . cyclobenzaprine (FLEXERIL) 10 MG tablet   Oral   Take 10 mg by mouth 4 (four) times daily as needed for muscle spasms.          . furosemide (LASIX) 20 MG tablet   Oral   Take 20 mg by mouth daily.         Marland Kitchen gabapentin (NEURONTIN) 800 MG tablet   Oral   Take 800 mg by mouth 3 (three) times daily.          Marland Kitchen HYDROmorphone HCl (EXALGO) 12 MG T24A   Oral   Take 12 mg by mouth daily as needed (pain).         . ondansetron (ZOFRAN) 4 MG tablet   Oral   Take 4 mg by mouth every 8 (eight) hours as needed for nausea or vomiting.         . Rivaroxaban (XARELTO) 20 MG TABS tablet   Oral   Take 20 mg by mouth daily with supper.         . Tapentadol HCl 100 MG TABS   Oral   Take 200 mg by mouth 3 (three) times daily as needed (for pain).           Pulse 80  Ht 5\' 1"  (1.549 m)  Wt 150 lb (68.04 kg)  BMI 28.36 kg/m2  SpO2 100%  LMP 10/16/2011 Physical Exam  Nursing note and vitals reviewed. Constitutional: She is oriented to person, place, and time. She appears well-developed and well-nourished.  Non-toxic appearance. She does not have a sickly appearance. No distress. Cervical collar in place.  HENT:  Head: Normocephalic. Head is without raccoon's eyes and without Battle's sign.    Right Ear: Tympanic membrane normal. There is mastoid tenderness. No hemotympanum.  Left Ear: External ear normal. No mastoid tenderness. No hemotympanum.  Eyes: EOM are normal. Pupils are equal, round, and reactive to light. No scleral icterus.  Neck: Neck supple.  Cardiovascular: Normal rate, regular rhythm and normal heart sounds.   No murmur heard. No lower extremity edema  Pulmonary/Chest: Effort normal and breath sounds normal. She has no wheezes. She has no rales. She exhibits no tenderness.  Abdominal: Soft. Bowel sounds are normal. There is no tenderness. There is no rebound and no guarding.  Musculoskeletal: Normal range of motion. She exhibits no edema.    Neurological: She is alert and oriented to person, place, and time.  Speech is clear and goal oriented, follows commands Cranial nerves III - XII grossly intact, no facial droop Normal strength in upper and lower extremities bilaterally, strong and equal grip strength Sensation normal to light touch Moves all 4 extremities without ataxia, coordination  intact Normal finger to nose and rapid alternating movements No pronator drift  Skin: Skin is warm and dry. No rash noted. She is not diaphoretic.  Psychiatric: She has a normal mood and affect. Her behavior is normal.    ED Course  Procedures (including critical care time) Labs Review Labs Reviewed  CBC WITH DIFFERENTIAL - Abnormal; Notable for the following:    MCHC 36.1 (*)    Neutrophils Relative % 30 (*)    Neutro Abs 1.3 (*)    Lymphocytes Relative 59 (*)    All other components within normal limits  PROTIME-INR - Abnormal; Notable for the following:    Prothrombin Time 18.3 (*)    INR 1.57 (*)    All other components within normal limits  COMPREHENSIVE METABOLIC PANEL - Abnormal; Notable for the following:    Alkaline Phosphatase 133 (*)    All other components within normal limits  POCT CBG (FASTING - GLUCOSE)-MANUAL ENTRY  I-STAT TROPOININ, ED  CBG MONITORING, ED   Imaging Review Ct Head Wo Contrast  02/10/2014   CLINICAL DATA:  Syncope. Possible blow to the head. Headache, worse on the right.  EXAM: CT HEAD WITHOUT CONTRAST  CT CERVICAL SPINE WITHOUT CONTRAST  TECHNIQUE: Multidetector CT imaging of the head and cervical spine was performed following the standard protocol without intravenous contrast. Multiplanar CT image reconstructions of the cervical spine were also generated.  COMPARISON:  Head and cervical spine CT scan 11/19/2012.  FINDINGS: CT HEAD FINDINGS  Patchy and confluent hypoattenuation in the subcortical and periventricular deep white matter appears unchanged. No evidence of acute intracranial abnormality  including infarct, hemorrhage, mass lesion, mass effect, midline shift or abnormal extra-axial fluid collection is identified. There is no hydrocephalus or pneumocephalus. The calvarium is intact.  CT CERVICAL SPINE FINDINGS  Again seen is postoperative change of C5-T1 fusion. No fracture or malalignment is identified. There appears to be a central disc protrusion at C4-5 which is unchanged. Lung apices demonstrate a nodule in the left lung apex measuring 0.3 cm, decreased from 0.5 cm of prior exam.  IMPRESSION: No acute finding head or cervical spine.  Extensive white matter disease could be due to chronic microvascular ischemic change or demyelinating process. The appearance is unchanged.  Status post C5-T1 fusion.  0.3 cm left upper lobe pulmonary nodule has decreased in size since the prior CT scan and may be due to some prior infectious or inflammatory process.   Electronically Signed   By: Inge Rise M.D.   On: 02/10/2014 13:08   Ct Cervical Spine Wo Contrast  02/10/2014   CLINICAL DATA:  Syncope. Possible blow to the head. Headache, worse on the right.  EXAM: CT HEAD WITHOUT CONTRAST  CT CERVICAL SPINE WITHOUT CONTRAST  TECHNIQUE: Multidetector CT imaging of the head and cervical spine was performed following the standard protocol without intravenous contrast. Multiplanar CT image reconstructions of the cervical spine were also generated.  COMPARISON:  Head and cervical spine CT scan 11/19/2012.  FINDINGS: CT HEAD FINDINGS  Patchy and confluent hypoattenuation in the subcortical and periventricular deep white matter appears unchanged. No evidence of acute intracranial abnormality including infarct, hemorrhage, mass lesion, mass effect, midline shift or abnormal extra-axial fluid collection is identified. There is no hydrocephalus or pneumocephalus. The calvarium is intact.  CT CERVICAL SPINE FINDINGS  Again seen is postoperative change of C5-T1 fusion. No fracture or malalignment is identified. There  appears to be a central disc protrusion at C4-5 which is unchanged. Lung  apices demonstrate a nodule in the left lung apex measuring 0.3 cm, decreased from 0.5 cm of prior exam.  IMPRESSION: No acute finding head or cervical spine.  Extensive white matter disease could be due to chronic microvascular ischemic change or demyelinating process. The appearance is unchanged.  Status post C5-T1 fusion.  0.3 cm left upper lobe pulmonary nodule has decreased in size since the prior CT scan and may be due to some prior infectious or inflammatory process.   Electronically Signed   By: Inge Rise M.D.   On: 02/10/2014 13:08     EKG Interpretation   Date/Time:  Thursday February 10 2014 11:41:43 EDT Ventricular Rate:  75 PR Interval:  142 QRS Duration: 84 QT Interval:  370 QTC Calculation: 413 R Axis:   47 Text Interpretation:  Sinus rhythm Similar to prior Confirmed by Mingo Amber   MD, [place] ([id number]) on 02/10/2014 11:51:04 AM      MDM   Final diagnoses:  Syncope   Pt with a single syncopal event, witnessed.  History consistent with a orthostatic hypotension. Complains of tenderness to the occipital skull, no battle signs, no neurologic deficits on exam. EKG without changes from prior. CT shows likely chronic demyelinating process (pt has history of MS). No acute findings.  CMP without concerning electrolyte abnormalities. CBC without anemia or leukocytosis.  Discussed lab results, imaging results, and treatment plan with the patient. Return precautions given. Reports understanding and no other concerns at this time.  Patient is stable for discharge at this time.     Lorrine Kin, PA-C 02/11/14 [phone]

## 2014-02-10 NOTE — ED Notes (Signed)
Per ems: was at ob-gyn and had her blood drawn for routine visit, witnessed syncopal episode, was not lowered to floor, unsure if head was hit.  Patient is on xerelto, and is c/o of right sided posterior head pain, and right sided facial.  No obvious neural deficits noted, but headache has increased in severity since incidient per patient

## 2014-02-10 NOTE — ED Notes (Signed)
Patient states that they took some blood and was waiting and was looking at pamphlets and does not remember after that point until she woke up.  Denies nausea, recent illness, fever chills, hx of this type of incident.   Fall was witnessed, patient fell on her face/head.  C/o facial pain, posterior right head pain

## 2014-02-10 NOTE — ED Notes (Signed)
C-collar applied

## 2014-02-14 NOTE — ED Provider Notes (Signed)
Medical screening examination/treatment/procedure(s) were performed by non-physician practitioner and as supervising physician I was immediately available for consultation/collaboration.   EKG Interpretation   Date/Time:  Thursday February 10 2014 11:41:43 EDT Ventricular Rate:  75 PR Interval:  142 QRS Duration: 84 QT Interval:  370 QTC Calculation: 413 R Axis:   47 Text Interpretation:  Sinus rhythm Similar to prior Confirmed by Mingo Amber   MD, [place] ([id number]) on 02/10/2014 11:51:04 AM        Osvaldo Shipper, MD 02/14/14 [phone]

## 2014-02-18 ENCOUNTER — Telehealth: Payer: Self-pay | Admitting: Hematology and Oncology

## 2014-02-18 NOTE — Telephone Encounter (Signed)
C/D 02/18/14 for appt. 02/22/14

## 2014-02-18 NOTE — Telephone Encounter (Signed)
S/W PT IN REF TO NP APPT ON 02/22/14@12 :00 REFERRING DR MCCOMB DX-PROTEIN C DEF. MAILED NP PACKET

## 2014-02-22 ENCOUNTER — Telehealth: Payer: Self-pay | Admitting: *Deleted

## 2014-02-22 ENCOUNTER — Encounter: Payer: Self-pay | Admitting: Hematology and Oncology

## 2014-02-22 ENCOUNTER — Ambulatory Visit: Payer: 59

## 2014-02-22 ENCOUNTER — Encounter (INDEPENDENT_AMBULATORY_CARE_PROVIDER_SITE_OTHER): Payer: Self-pay

## 2014-02-22 ENCOUNTER — Ambulatory Visit (HOSPITAL_BASED_OUTPATIENT_CLINIC_OR_DEPARTMENT_OTHER): Payer: 59 | Admitting: Hematology and Oncology

## 2014-02-22 VITALS — BP 138/69 | HR 85 | Temp 98.2°F | Resp 20 | Ht 61.0 in | Wt 167.2 lb

## 2014-02-22 DIAGNOSIS — D6859 Other primary thrombophilia: Secondary | ICD-10-CM

## 2014-02-22 DIAGNOSIS — I824Z1 Acute embolism and thrombosis of unspecified deep veins of right distal lower extremity: Secondary | ICD-10-CM

## 2014-02-22 DIAGNOSIS — I824Z9 Acute embolism and thrombosis of unspecified deep veins of unspecified distal lower extremity: Secondary | ICD-10-CM

## 2014-02-22 DIAGNOSIS — F17200 Nicotine dependence, unspecified, uncomplicated: Secondary | ICD-10-CM
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
## 2014-02-22 NOTE — Progress Notes (Signed)
Checked in new patient with no financial issues.  °

## 2014-02-22 NOTE — Telephone Encounter (Signed)
Pt called to clarify that she thinks she told Dr. Alvy Bimler that her "blood clots" do not interfere w/ her daily activities.  She is on disability for RSD and which does affect her ability to perform ADLs.   Pt just wanted to make sure she was clear about this.

## 2014-03-14 ENCOUNTER — Ambulatory Visit (INDEPENDENT_AMBULATORY_CARE_PROVIDER_SITE_OTHER): Payer: 59 | Admitting: Cardiology

## 2014-03-14 ENCOUNTER — Encounter: Payer: Self-pay | Admitting: Cardiology

## 2014-03-14 VITALS — BP 130/74 | HR 74 | Ht 61.0 in | Wt 165.0 lb

## 2014-03-14 DIAGNOSIS — Z86718 Personal history of other venous thrombosis and embolism: Secondary | ICD-10-CM

## 2014-03-14 DIAGNOSIS — Z720 Tobacco use: Secondary | ICD-10-CM

## 2014-03-14 DIAGNOSIS — Z8249 Family history of ischemic heart disease and other diseases of the circulatory system: Secondary | ICD-10-CM

## 2014-03-14 DIAGNOSIS — R0600 Dyspnea, unspecified: Secondary | ICD-10-CM

## 2014-03-14 DIAGNOSIS — R0989 Other specified symptoms and signs involving the circulatory and respiratory systems: Secondary | ICD-10-CM

## 2014-03-14 DIAGNOSIS — R55 Syncope and collapse: Secondary | ICD-10-CM

## 2014-03-14 DIAGNOSIS — R0609 Other forms of dyspnea: Secondary | ICD-10-CM

## 2014-03-14 DIAGNOSIS — F17200 Nicotine dependence, unspecified, uncomplicated: Secondary | ICD-10-CM

## 2014-03-14 NOTE — Progress Notes (Signed)
[facility]. [address], [address] [place], [place]  [address] Phone: [phone] Fax:  [phone]  Date:  03/14/2014   ID:  Becky Wolfe, DOB 02/14/1961, MRN [id number]  PCP:  Mayra Neer, MD   History of Present Illness: IVALEE Wolfe is a 53 y.o. female with early family history of coronary artery disease who was previously in the emergency department because of syncope which she says she was out for at least 20-25 minutes here for further evaluation. Has a history of hepatitis C. Chronic anxiety. Tobacco use smokes 2-3 cigarettes a day for stress relief. Her sister was 68 years old and diagnosed with coronary artery disease. She denies chest pain or shortness of breath but does not exercise. Her LDL cholesterol is 58.Father died at 18 from MI. Sometimes some swelling. Sometimes she feels tired. Sometimes feels dyspnea when sitting or going up the stairs.   She still continues to smoke one cigarette here and there. Prior cardiac workup was reassuring, echocardiogram, nuclear stress test.  She was on anticoagulation secondary to DVT. Saw hematology. Pain with RSD.   Fainted after blood draw. Did not feel anything. 4/15. Had amnesia surrounding this episode. Has not had any further episodes since.  She does note that she has occasional palpitations. She was concerned because after a root canal, she felt an irregular pattern with her heart and she was concerned about the possibility of infection. She's had no fevers.  She also states that she is cured from hepatitis C.  She's had no further syncopal episodes. She does occasionally take Lasix, notices a 5 pound weight loss. I wonder if this exacerbated her syncope.       Wt Readings from Last 3 Encounters:  03/14/14 165 lb (74.844 kg)  02/22/14 167 lb 3.2 oz (75.841 kg)  02/10/14 150 lb (68.04 kg)     Past Medical History  Diagnosis Date  . Multiple sclerosis     dx. -48yrs ago, then med stopped-due to liver enzymes  changes,occ. periods of weakness in arms"drops things"  . Heart murmur     Dr. Etter Sjogren  . Bronchitis     hx of  . TIA (transient ischemic attack)     hx of (weakness left side)-none in 1 yr  . Chronic neck pain   . Blood transfusion without reported diagnosis     20 yrs ago after childbirth  . DVT (deep venous thrombosis)     rt. leg   . Anxiety 06-16-13    hx. panic attacks, none recent  . Headache(784.0)     migraines-has decreased  . Hepatitis C     dx. Hep. C(s/p transfusion age 73) low Hgb  .-multiple transfusions.  . RSD (reflex sympathetic dystrophy) 06-16-13    legs  . Pneumonia     hx of , ? mild emphysema on CT scan 5'14, multiple pulmonary nodules    Past Surgical History  Procedure Laterality Date  . Neck surgery    . Cesarean section    . Abdominal hysterectomy    . Appendectomy    . Wedge resection      left ovary with cyst removed ,hx. multiple cysts removed prior  . Anterior cervical decomp/discectomy fusion  09/21/2012    Procedure: ANTERIOR CERVICAL DECOMPRESSION/DISCECTOMY FUSION 2 LEVELS;  Surgeon: Otilio Connors, MD;  Location: [facility];  Service: Neurosurgery;  Laterality: N/A;  Cervical five-six, cervical seven-thoracic one Anterior cervical decompression/diskectomy/fusion/Lifenet bone/trestle plate/Prior Cervical six--seven Anterior cervical  fusion/Removing trestle plate  . Ankle surgery Right     no retained hardware  . Esophagogastroduodenoscopy (egd) with propofol N/A 06/30/2013    Procedure: ESOPHAGOGASTRODUODENOSCOPY (EGD) WITH PROPOFOL;  Surgeon: Arta Silence, MD;  Location: [facility];  Service: Endoscopy;  Laterality: N/A;    Current Outpatient Prescriptions  Medication Sig Dispense Refill  . amitriptyline (ELAVIL) 50 MG tablet Take 50 mg by mouth daily.      . clonazePAM (KLONOPIN) 0.5 MG tablet Take 0.5 mg by mouth 2 (two) times daily as needed for anxiety.       . cloNIDine (CATAPRES) 0.1 MG tablet Take 0.1 mg by mouth every 8 (eight)  hours as needed (anxiety).       . furosemide (LASIX) 20 MG tablet Take 20 mg by mouth 2 (two) times daily.       Marland Kitchen gabapentin (NEURONTIN) 800 MG tablet Take 800 mg by mouth 3 (three) times daily.       Marland Kitchen HYDROmorphone HCl (EXALGO) 12 MG T24A Take 12 mg by mouth daily as needed (pain).      . ondansetron (ZOFRAN) 4 MG tablet Take 4 mg by mouth every 8 (eight) hours as needed for nausea or vomiting.      . Rivaroxaban (XARELTO) 20 MG TABS tablet Take 20 mg by mouth daily with supper.      . Tapentadol HCl 100 MG TABS Take 200 mg by mouth 3 (three) times daily as needed (for pain).        No current facility-administered medications for this visit.    Allergies:    Allergies  Allergen Reactions  . Lodine [Etodolac] Swelling    Facial swelling    Social History:  The patient  reports that she has been smoking.  She has never used smokeless tobacco. She reports that she does not drink alcohol or use illicit drugs.   Family History  Problem Relation Age of Onset  . Clotting disorder Sister     lung cancer    ROS:  Please see the history of present illness.   Palpitations - after root canal. Reg pattern.    All other systems reviewed and negative.   PHYSICAL EXAM: VS:  BP 130/74  Pulse 74  Ht 5\' 1"  (1.549 m)  Wt 165 lb (74.844 kg)  BMI 31.19 kg/m2  LMP 10/16/2011 Well nourished, well developed, in no acute distress HEENT: normal, [place]/AT, EOMI Neck: no JVD, normal carotid upstroke, no bruit Cardiac:  normal S1, S2; RRR; no murmur Lungs:  clear to auscultation bilaterally, no wheezing, rhonchi or rales Abd: soft, nontender, no hepatomegaly, no bruits Ext: no edema, 2+ distal pulses Skin: warm and dry GU: deferred Neuro: no focal abnormalities noted, AAO x 3  EKG:  None today. NUC 2013 - low risk, no ischemia.  ECHO: 08/12/12 1. There is mild concentric left ventricle hypertrophy. 2. Left ventricular ejection fraction estimated by 2D at 65-70 percent. 3. There were no regional  wall motion abnormalities. 4. Mild mitral annular calcification. 5. Mild calcification of the aortic valve. 6. Mild aortic valve regurgitation. 7. Analysis of mitral valve inflow, pulmonary vein Doppler and tissue Doppler suggests grade I diastolic dysfunction without elevated left atrial pressure.  ASSESSMENT AND PLAN:  1. Syncope - possibly secondary to intravascular volume depletion and increased vagal response after blood draw/vasovagal syncope. He careful with Lasix. Maintain adequate hydration. If symptoms worsen or become more frequent, event monitor may be indicated. 2. Dyspnea - takes lasix. (5 pound weight  loss) this is intermittent. Careful administration of this medication. 3. Family history of CAD - mother and father MI. Cardiac workup previously reassuring. Continue with aggressive primary prevention. 4. Tobacco use-urged her to stop tobacco use. Out of all primary prevention, this is the single most important thing for her especially in light of prior DVT. 5. History of DVT-currently on aspirin after anticoagulation expires. She has seen hematology for this. Dr. Raul Del notes reviewed.  Signed, Candee Furbish, MD [facility]  03/14/2014 9:48 AM

## 2014-03-14 NOTE — Patient Instructions (Signed)
Your physician recommends that you schedule a follow-up appointment as needed with Dr Skains.    

## 2014-04-05 ENCOUNTER — Ambulatory Visit: Payer: Self-pay

## 2014-04-26 ENCOUNTER — Other Ambulatory Visit: Payer: Self-pay | Admitting: Nurse Practitioner

## 2014-04-26 ENCOUNTER — Telehealth: Payer: Self-pay | Admitting: *Deleted

## 2014-04-26 DIAGNOSIS — C220 Liver cell carcinoma: Secondary | ICD-10-CM

## 2014-04-26 NOTE — Telephone Encounter (Signed)
Dr. Alvy Bimler received referral from Dr. Radene Knee.  Dr. Alvy Bimler questions whether Dr. Radene Knee aware she has already seen pt for same reason.  Faxed over Office note to Dr. Sherran Needs office and left VM for his nurse, Santiago Glad, to please call us back if Dr. Radene Knee does indeed need Dr. Alvy Bimler to see pt again.

## 2014-05-04 ENCOUNTER — Other Ambulatory Visit: Payer: Self-pay | Admitting: Nurse Practitioner

## 2014-05-04 DIAGNOSIS — R772 Abnormality of alphafetoprotein: Secondary | ICD-10-CM

## 2014-05-05 ENCOUNTER — Other Ambulatory Visit: Payer: 59

## 2014-05-09 ENCOUNTER — Ambulatory Visit
Admission: RE | Admit: 2014-05-09 | Discharge: 2014-05-09 | Disposition: A | Discharge status: Home / Self Care | Payer: 59 | Source: Ambulatory Visit | Attending: Nurse Practitioner | Admitting: Nurse Practitioner

## 2014-05-09 DIAGNOSIS — R772 Abnormality of alphafetoprotein: Secondary | ICD-10-CM

## 2014-05-09 MED ORDER — GADOXETATE DISODIUM 0.25 MOL/L IV SOLN
7.0000 mL | Freq: Once | INTRAVENOUS | Status: AC | PRN
Start: 2014-05-09 — End: 2014-05-09
  Administered 2014-05-09: 7 mL via INTRAVENOUS

## 2014-10-03 ENCOUNTER — Other Ambulatory Visit: Payer: Self-pay | Admitting: Family Medicine

## 2014-10-03 ENCOUNTER — Ambulatory Visit (HOSPITAL_COMMUNITY)
Admission: RE | Admit: 2014-10-03 | Discharge: 2014-10-03 | Disposition: A | Discharge status: Home / Self Care | Payer: 59 | Source: Ambulatory Visit | Attending: Surgery | Admitting: Surgery

## 2014-10-03 ENCOUNTER — Other Ambulatory Visit (HOSPITAL_COMMUNITY): Payer: Self-pay | Admitting: Family Medicine

## 2014-10-03 DIAGNOSIS — M79662 Pain in left lower leg: Secondary | ICD-10-CM

## 2014-10-03 DIAGNOSIS — Z86718 Personal history of other venous thrombosis and embolism: Secondary | ICD-10-CM

## 2014-10-03 DIAGNOSIS — R600 Localized edema: Secondary | ICD-10-CM | POA: Insufficient documentation

## 2015-01-06 ENCOUNTER — Ambulatory Visit (INDEPENDENT_AMBULATORY_CARE_PROVIDER_SITE_OTHER): Payer: 59 | Admitting: Nurse Practitioner

## 2015-01-06 ENCOUNTER — Encounter: Payer: Self-pay | Admitting: Nurse Practitioner

## 2015-01-06 VITALS — BP 136/68 | HR 68 | Ht 61.0 in | Wt 169.0 lb

## 2015-01-06 DIAGNOSIS — Z720 Tobacco use: Secondary | ICD-10-CM

## 2015-01-06 DIAGNOSIS — R002 Palpitations: Secondary | ICD-10-CM

## 2015-01-06 DIAGNOSIS — R079 Chest pain, unspecified: Secondary | ICD-10-CM

## 2015-01-06 DIAGNOSIS — R0600 Dyspnea, unspecified: Secondary | ICD-10-CM

## 2015-01-06 NOTE — Patient Instructions (Signed)
We will arrange for a GXT - treadmill test  We will arrange for echocardiogram  We will place an event monitor.   See Dr. Marlou Porch in 5 weeks  Call the [place] office at [phone] if you have any questions, problems or concerns.

## 2015-01-06 NOTE — Progress Notes (Addendum)
CARDIOLOGY OFFICE NOTE  Date:  01/06/2015    Becky Wolfe Date of Birth: 1961-01-20 Medical Record #[id number]  PCP:  Mayra Neer, MD  Cardiologist:  [facility]   Chief Complaint  Patient presents with  . Shortness of Breath    Work in visit - seen for Dr. Marlou Porch     History of Present Illness: Becky Wolfe is a 54 y.o. female who presents today for a work in visit. Seen for Dr. Marlou Porch. She has no known CAD but has a early history of CAD with her family. Prior syncope and was seen here in 2015.   Seen just once last year - lots of somatic issues.  Comes back today. Here alone. Lots of issues. Wants to "check on things" in regards to her heart. She notes that her heart is skipping beats, worse with lying down. Sometimes it will "beat double". Some pounding and racing as well. Does not happen every day. She is retaining fluid. Says her skin "will just pop open". No NSAID use. Sounds like she gets too much salt with canned items. Last week with some pain in her chest that went to her shoulder. She continues to smoke. Strong FH for early CAD. She will get dizzy at times - no syncope. Really does not exercise due to issues with her legs from RSD symptoms. She is using clonidine for anxiety - takes off and on. Labs are checked by PCP - says she has had recent labs that included TSH. Says her lipids are ok.    Past Medical History  Diagnosis Date  . Multiple sclerosis     dx. -19yrs ago, then med stopped-due to liver enzymes changes,occ. periods of weakness in arms"drops things"  . Heart murmur     Dr. Etter Sjogren  . Bronchitis     hx of  . TIA (transient ischemic attack)     hx of (weakness left side)-none in 1 yr  . Chronic neck pain   . Blood transfusion without reported diagnosis     20 yrs ago after childbirth  . DVT (deep venous thrombosis)     rt. leg   . Anxiety 06-16-13    hx. panic attacks, none recent  . Headache(784.0)     migraines-has decreased  . Hepatitis C     dx. Hep. C(s/p transfusion age 41) low Hgb  .-multiple transfusions.  . RSD (reflex sympathetic dystrophy) 06-16-13    legs  . Pneumonia     hx of , ? mild emphysema on CT scan 5'14, multiple pulmonary nodules    Past Surgical History  Procedure Laterality Date  . Neck surgery    . Cesarean section    . Abdominal hysterectomy    . Appendectomy    . Wedge resection      left ovary with cyst removed ,hx. multiple cysts removed prior  . Anterior cervical decomp/discectomy fusion  09/21/2012    Procedure: ANTERIOR CERVICAL DECOMPRESSION/DISCECTOMY FUSION 2 LEVELS;  Surgeon: Otilio Connors, MD;  Location: [facility];  Service: Neurosurgery;  Laterality: N/A;  Cervical five-six, cervical seven-thoracic one Anterior cervical decompression/diskectomy/fusion/Lifenet bone/trestle plate/Prior Cervical six--seven Anterior cervical fusion/Removing trestle plate  . Ankle surgery Right     no retained hardware  . Esophagogastroduodenoscopy (egd) with propofol N/A 06/30/2013    Procedure: ESOPHAGOGASTRODUODENOSCOPY (EGD) WITH PROPOFOL;  Surgeon: Arta Silence, MD;  Location: [facility];  Service: Endoscopy;  Laterality: N/A;     Medications: Current Outpatient Prescriptions  Medication Sig  Dispense Refill  . amitriptyline (ELAVIL) 50 MG tablet Take 50 mg by mouth daily.    Marland Kitchen aspirin EC 81 MG tablet Take 162 mg by mouth.    . clonazePAM (KLONOPIN) 0.5 MG tablet Take 0.5 mg by mouth 2 (two) times daily as needed for anxiety.     . cloNIDine (CATAPRES) 0.1 MG tablet Take 0.1 mg by mouth every 8 (eight) hours as needed (anxiety).     . cyclobenzaprine (FLEXERIL) 10 MG tablet Take 10 mg by mouth at bedtime.     . furosemide (LASIX) 20 MG tablet Take 20 mg by mouth 2 (two) times daily.     Marland Kitchen gabapentin (NEURONTIN) 800 MG tablet Take 800 mg by mouth 3 (three) times daily.     . Naloxegol Oxalate 25 MG TABS Take by mouth daily.    . ondansetron (ZOFRAN) 4 MG tablet Take 4 mg by mouth every 8 (eight)  hours as needed for nausea or vomiting.    . Tapentadol HCl 100 MG TABS Take 200 mg by mouth 3 (three) times daily as needed (for pain).      No current facility-administered medications for this visit.    Allergies: Allergies  Allergen Reactions  . Lodine [Etodolac] Swelling    Facial swelling Facial swelling    Social History: The patient  reports that she has been smoking.  She has never used smokeless tobacco. She reports that she does not drink alcohol or use illicit drugs.   Family History: The patient's family history includes Clotting disorder in her sister; Heart attack in her father; Heart attack (age of onset: 83) in her mother; Heart disease in her father and mother.   Review of Systems: Please see the history of present illness.   Otherwise, the review of systems is positive for weight changes, appetite changes, leg swelling, back pain, anxiety, difficulty urinating, joint swelling, balance issues and headaches.   All other systems are reviewed and negative.   Physical Exam: VS:  BP 136/68 mmHg  Pulse 68  Ht 5\' 1"  (1.549 m)  Wt 169 lb (76.658 kg)  BMI 31.95 kg/m2  LMP 10/16/2011 .  BMI Body mass index is 31.95 kg/(m^2).  Wt Readings from Last 3 Encounters:  01/06/15 169 lb (76.658 kg)  03/14/14 165 lb (74.844 kg)  02/22/14 167 lb 3.2 oz (75.841 kg)    General: Pleasant. Well developed, well nourished and in no acute distress.  HEENT: Normal. Neck: Supple, no JVD, carotid bruits, or masses noted.  Cardiac: Regular rate and rhythm. No murmurs, rubs, or gallops. No edema.  Respiratory:  Lungs are clear to auscultation bilaterally with normal work of breathing.  GI: Soft and nontender.  MS: No deformity or atrophy. Gait and ROM intact. Skin: Warm and dry. Color is normal.  Neuro:  Strength and sensation are intact and no gross focal deficits noted.  Psych: Alert, appropriate and with normal affect.   LABORATORY DATA:  EKG:  EKG is ordered today. This  demonstrates NSR.  Lab Results  Component Value Date   WBC 4.2 02/10/2014   HGB 13.8 02/10/2014   HCT 38.2 02/10/2014   PLT 161 02/10/2014   GLUCOSE 81 02/10/2014   CHOL 129 09/02/2012   TRIG 124 09/02/2012   HDL 60 09/02/2012   LDLCALC 44 09/02/2012   ALT 20 02/10/2014   AST 26 02/10/2014   NA 142 02/10/2014   K 3.9 02/10/2014   CL 106 02/10/2014   CREATININE 0.74 02/10/2014  BUN 6 02/10/2014   CO2 24 02/10/2014   TSH 0.620 08/08/2011   INR 1.57* 02/10/2014   HGBA1C 5.6 09/02/2012    BNP (last 3 results) No results for input(s): BNP in the last 8760 hours.  ProBNP (last 3 results) No results for input(s): PROBNP in the last 8760 hours.   Other Studies Reviewed Today:   Assessment/Plan: 1. Palpitations - episodic - will place event monitor - advised to cut back on caffeine.   2. Swelling - will check echo - needs to cut out the salt  3. Atypical chest pain - will arrange GXT  4. +FH for CAD  5. Tobacco abuse - recommended to stop.   Current medicines are reviewed with the patient today.  The patient does not have concerns regarding medicines other than what has been noted above.  The following changes have been made:  See above.  Labs/ tests ordered today include:    Orders Placed This Encounter  Procedures  . EKG 12-Lead  . Cardiac event monitor  . Exercise Tolerance Test  . 2D Echocardiogram without contrast     Disposition:   FU with Dr. Marlou Porch in 5 weeks  Patient is agreeable to this plan and will call if any problems develop in the interim.   Signed: Burtis Junes, RN, ANP-C 01/06/2015 4:28 PM  [facility] [address] [place], [place]  [address] Phone: [phone] Fax: [phone]

## 2015-01-11 ENCOUNTER — Other Ambulatory Visit: Payer: Self-pay | Admitting: Family Medicine

## 2015-01-11 DIAGNOSIS — R918 Other nonspecific abnormal finding of lung field: Secondary | ICD-10-CM

## 2015-01-13 ENCOUNTER — Ambulatory Visit
Admission: RE | Admit: 2015-01-13 | Discharge: 2015-01-13 | Disposition: A | Discharge status: Home / Self Care | Payer: 59 | Source: Ambulatory Visit | Attending: Family Medicine | Admitting: Family Medicine

## 2015-01-13 DIAGNOSIS — R918 Other nonspecific abnormal finding of lung field: Secondary | ICD-10-CM

## 2015-01-19 ENCOUNTER — Encounter (INDEPENDENT_AMBULATORY_CARE_PROVIDER_SITE_OTHER): Payer: 59

## 2015-01-19 ENCOUNTER — Encounter: Payer: Self-pay | Admitting: *Deleted

## 2015-01-19 ENCOUNTER — Ambulatory Visit (HOSPITAL_COMMUNITY): Payer: 59 | Attending: Nurse Practitioner | Admitting: Radiology

## 2015-01-19 DIAGNOSIS — R0600 Dyspnea, unspecified: Secondary | ICD-10-CM | POA: Diagnosis not present

## 2015-01-19 DIAGNOSIS — R002 Palpitations: Secondary | ICD-10-CM

## 2015-01-19 DIAGNOSIS — R0609 Other forms of dyspnea: Secondary | ICD-10-CM | POA: Diagnosis not present

## 2015-01-19 DIAGNOSIS — R079 Chest pain, unspecified: Secondary | ICD-10-CM

## 2015-01-19 DIAGNOSIS — Z720 Tobacco use: Secondary | ICD-10-CM | POA: Diagnosis not present

## 2015-01-19 NOTE — Progress Notes (Signed)
Patient ID: Becky Wolfe, female   DOB: May 07, 1961, 54 y.o.   MRN: [id number] [employer] 30 day cardiac event monitor applied to patient.

## 2015-01-19 NOTE — Progress Notes (Signed)
Echocardiogram performed.  

## 2015-01-27 ENCOUNTER — Encounter: Payer: Self-pay | Admitting: *Deleted

## 2015-01-31 ENCOUNTER — Other Ambulatory Visit: Payer: Self-pay | Admitting: *Deleted

## 2015-01-31 ENCOUNTER — Telehealth: Payer: Self-pay | Admitting: Cardiology

## 2015-01-31 NOTE — Telephone Encounter (Signed)
Reviewed results with pt RE: echocardiogram.  She was asked about results of the CT scan that was ordered by another MD/NA - advised Dr Marlou Porch has not reviewed that information.  She was concerned because the nurse from her PCP office told her she has a hx of HTN.  Advised we do not have that listed in her chart as a DX.  She will f/u up with her PCP.

## 2015-01-31 NOTE — Telephone Encounter (Signed)
New Message        Pt calling to get results of echo. Please call back and advise.

## 2015-02-01 ENCOUNTER — Encounter: Payer: Self-pay | Admitting: *Deleted

## 2015-02-07 ENCOUNTER — Telehealth: Payer: Self-pay | Admitting: Cardiology

## 2015-02-07 NOTE — Telephone Encounter (Signed)
Informed the pt that per Truitt Merle NP, the pts echo looks very good. Normal pumping function. No significant valve problems.  Pt verbalized understanding and pleased with this news.

## 2015-02-07 NOTE — Telephone Encounter (Signed)
New message ° ° ° ° °Want test results °

## 2015-02-10 ENCOUNTER — Ambulatory Visit (INDEPENDENT_AMBULATORY_CARE_PROVIDER_SITE_OTHER): Payer: 59 | Admitting: Cardiology

## 2015-02-10 ENCOUNTER — Encounter: Payer: Self-pay | Admitting: Cardiology

## 2015-02-10 VITALS — BP 132/64 | HR 78 | Ht 61.0 in | Wt 163.0 lb

## 2015-02-10 DIAGNOSIS — R0600 Dyspnea, unspecified: Secondary | ICD-10-CM

## 2015-02-10 DIAGNOSIS — I2510 Atherosclerotic heart disease of native coronary artery without angina pectoris: Secondary | ICD-10-CM

## 2015-02-10 DIAGNOSIS — Z86718 Personal history of other venous thrombosis and embolism: Secondary | ICD-10-CM | POA: Diagnosis not present

## 2015-02-10 DIAGNOSIS — Z720 Tobacco use: Secondary | ICD-10-CM | POA: Diagnosis not present

## 2015-02-10 NOTE — Patient Instructions (Signed)
The current medical regimen is effective;  continue present plan and medications.  Your physician has requested that you have a myoview. For further information please visit [email or web address]. Please follow instruction sheet, as given.  Follow up as needed with Dr. Marlou Porch.  Thank you for choosing [facility]!!

## 2015-02-10 NOTE — Progress Notes (Signed)
[facility]. [address] [place], [place]  [address] Phone: [phone] Fax:  [phone]  Date:  02/10/2015   ID:  Becky Wolfe, DOB September 28, 1961, MRN [id number]  PCP:  Mayra Neer, MD   History of Present Illness: Becky Wolfe is a 54 y.o. female with early family history of coronary artery disease who was previously in the emergency department because of syncope which she says she was out for at least 20-25 minutes here for further evaluation. She underwent a CT scan without contrast on 01/13/15 which showed coronary atherosclerosis in the LAD. Has a history of hepatitis C. Chronic anxiety. Tobacco use smokes 2-3 cigarettes a day for stress relief. Her sister was 64 years old and diagnosed with coronary artery disease. She denies chest pain or shortness of breath but does not exercise. Her LDL cholesterol is 58. Father died at 62 from MI. Sometimes some swelling. Sometimes she feels tired. Sometimes feels dyspnea when sitting or going up the stairs.   She still continues to smoke one cigarette here and there. Prior cardiac workup was reassuring, echocardiogram, nuclear stress test when he 12  She was on anticoagulation secondary to DVT. Saw hematology. Pain with RSD.   Fainted after blood draw. Did not feel anything. 4/15. Had amnesia surrounding this episode. Has not had any further episodes since.  She does note that she has occasional palpitations.   She also states that she is cured from hepatitis C.  She's had no further syncopal episodes. She does occasionally take Lasix, notices a 5 pound weight loss. I wonder if this exacerbated her syncope.   We discussed tobacco use at length.   Wt Readings from Last 3 Encounters:  02/10/15 163 lb (73.936 kg)  01/06/15 169 lb (76.658 kg)  03/14/14 165 lb (74.844 kg)     Past Medical History  Diagnosis Date  . Multiple sclerosis     dx. -69yrs ago, then med stopped-due to liver enzymes changes,occ. periods of weakness in  arms"drops things"  . Heart murmur     Dr. Etter Sjogren  . Bronchitis     hx of  . TIA (transient ischemic attack)     hx of (weakness left side)-none in 1 yr  . Chronic neck pain   . Blood transfusion without reported diagnosis     20 yrs ago after childbirth  . DVT (deep venous thrombosis)     rt. leg   . Anxiety 06-16-13    hx. panic attacks, none recent  . Headache(784.0)     migraines-has decreased  . Hepatitis C     dx. Hep. C(s/p transfusion age 64) low Hgb  .-multiple transfusions.  . RSD (reflex sympathetic dystrophy) 06-16-13    legs  . Pneumonia     hx of , ? mild emphysema on CT scan 5'14, multiple pulmonary nodules    Past Surgical History  Procedure Laterality Date  . Neck surgery    . Cesarean section    . Abdominal hysterectomy    . Appendectomy    . Wedge resection      left ovary with cyst removed ,hx. multiple cysts removed prior  . Anterior cervical decomp/discectomy fusion  09/21/2012    Procedure: ANTERIOR CERVICAL DECOMPRESSION/DISCECTOMY FUSION 2 LEVELS;  Surgeon: Becky Connors, MD;  Location: [facility];  Service: Neurosurgery;  Laterality: N/A;  Cervical five-six, cervical seven-thoracic one Anterior cervical decompression/diskectomy/fusion/Lifenet bone/trestle plate/Prior Cervical six--seven Anterior cervical fusion/Removing trestle plate  . Ankle  surgery Right     no retained hardware  . Esophagogastroduodenoscopy (egd) with propofol N/A 06/30/2013    Procedure: ESOPHAGOGASTRODUODENOSCOPY (EGD) WITH PROPOFOL;  Surgeon: Arta Silence, MD;  Location: [facility];  Service: Endoscopy;  Laterality: N/A;    Current Outpatient Prescriptions  Medication Sig Dispense Refill  . amitriptyline (ELAVIL) 50 MG tablet Take 50 mg by mouth at bedtime as needed.     Marland Kitchen aspirin EC 81 MG tablet Take 162 mg by mouth.    . clonazePAM (KLONOPIN) 0.5 MG tablet Take 0.5 mg by mouth 2 (two) times daily as needed for anxiety.     . cloNIDine (CATAPRES) 0.1 MG tablet Take 0.1  mg by mouth every 8 (eight) hours as needed (anxiety).     . cyclobenzaprine (FLEXERIL) 10 MG tablet Take 10 mg by mouth as needed.     . furosemide (LASIX) 20 MG tablet Take 20 mg by mouth as needed.     . gabapentin (NEURONTIN) 800 MG tablet Take 800 mg by mouth 3 (three) times daily.     . Naloxegol Oxalate 25 MG TABS Take by mouth as needed.     . simvastatin (ZOCOR) 10 MG tablet   11  . Tapentadol HCl 100 MG TABS Take 200 mg by mouth 3 (three) times daily as needed (for pain).      No current facility-administered medications for this visit.    Allergies:    Allergies  Allergen Reactions  . Lodine [Etodolac] Swelling    Facial swelling Facial swelling    Social History:  The patient  reports that she has been smoking.  She has never used smokeless tobacco. She reports that she does not drink alcohol or use illicit drugs.   Family History  Problem Relation Age of Onset  . Clotting disorder Sister     lung cancer  . Heart attack Mother 75    with CABG  . Heart disease Mother   . Heart attack Father   . Heart disease Father     ROS:  Please see the history of present illness.   Palpitations - after root canal. Reg pattern.    All other systems reviewed and negative.   PHYSICAL EXAM: VS:  BP 132/64 mmHg  Pulse 78  Ht 5\' 1"  (1.549 m)  Wt 163 lb (73.936 kg)  BMI 30.81 kg/m2  LMP 10/16/2011 Well nourished, well developed, in no acute distress HEENT: normal, [place]/AT, EOMI Neck: no JVD, normal carotid upstroke, no bruit Cardiac:  normal S1, S2; RRR; no murmur Lungs:  clear to auscultation bilaterally, no wheezing, rhonchi or rales Abd: soft, nontender, no hepatomegaly, no bruits Ext: no edema, 2+ distal pulses Skin: warm and dry GU: deferred Neuro: no focal abnormalities noted, AAO x 3  EKG:  None today.  NUC 2013 - low risk, no ischemia.   ECHO: 08/12/12 1. There is mild concentric left ventricle hypertrophy. 2. Left ventricular ejection fraction estimated by 2D at  65-70 percent. 3. There were no regional wall motion abnormalities. 4. Mild mitral annular calcification. 5. Mild calcification of the aortic valve. 6. Mild aortic valve regurgitation. 7. Analysis of mitral valve inflow, pulmonary vein Doppler and tissue Doppler suggests grade I diastolic dysfunction without elevated left atrial pressure.  ASSESSMENT AND PLAN:  1. Coronary artery calcification/atherosclerosis-LAD on CT scan. Nuclear stress test in 2013 was low risk showing no ischemia. Will repeat NUC.  2. Syncope - no further episodes. On past visit, possibly secondary to intravascular volume depletion  and increased vagal response after blood draw/vasovagal syncope. He careful with Lasix. Maintain adequate hydration. If symptoms worsen or become more frequent, event monitor may be indicated. 3. Dyspnea - does not have any current issues with this, previously takes lasix. (5 pound weight loss) this is intermittent. Careful administration of this medication. 4. Family history of CAD - mother and father MI. Cardiac workup previously reassuring. Continue with aggressive prevention. Encourage simvastatin. She has not started yet. 5. Tobacco use-urged her to stop tobacco use. Out of all primary prevention, this is the single most important thing for her especially in light of prior DVT. American Heart Association website recommended. [place] recommended. 6. History of DVT-currently on aspirin after anticoagulation expires. She has seen hematology for this. Dr. Raul Del notes reviewed. 7. We will follow up with NUC stress test.   Signed, Candee Furbish, MD [facility]  02/10/2015 9:24 AM

## 2015-02-14 ENCOUNTER — Encounter: Payer: 59 | Admitting: Nurse Practitioner

## 2015-02-21 ENCOUNTER — Ambulatory Visit (HOSPITAL_COMMUNITY): Payer: 59 | Attending: Cardiology | Admitting: Radiology

## 2015-02-21 DIAGNOSIS — R0600 Dyspnea, unspecified: Secondary | ICD-10-CM

## 2015-02-21 DIAGNOSIS — R002 Palpitations: Secondary | ICD-10-CM | POA: Diagnosis not present

## 2015-02-21 DIAGNOSIS — I2510 Atherosclerotic heart disease of native coronary artery without angina pectoris: Secondary | ICD-10-CM | POA: Diagnosis not present

## 2015-02-21 DIAGNOSIS — R079 Chest pain, unspecified: Secondary | ICD-10-CM | POA: Diagnosis not present

## 2015-02-21 DIAGNOSIS — R0602 Shortness of breath: Secondary | ICD-10-CM | POA: Diagnosis not present

## 2015-02-21 MED ORDER — TECHNETIUM TC 99M SESTAMIBI GENERIC - CARDIOLITE
11.0000 | Freq: Once | INTRAVENOUS | Status: AC | PRN
  Administered 2015-02-21: 11 via INTRAVENOUS

## 2015-02-21 MED ORDER — TECHNETIUM TC 99M SESTAMIBI GENERIC - CARDIOLITE
30.0000 | Freq: Once | INTRAVENOUS | Status: AC | PRN
  Administered 2015-02-21: 30 via INTRAVENOUS

## 2015-02-21 NOTE — Progress Notes (Signed)
[facility] [address] [place], [place] [address] [phone]    Cardiology Nuclear Med Study  Becky Wolfe is a 54 y.o. female     MRN : [id number]     DOB: 1961-02-06  Procedure Date: 02/21/2015  Nuclear Med Background Indication for Stress Test:  Evaluation for Ischemia History:  '13 MPI: MPI low risk Cardiac Risk Factors: Lipids and Smoker, Coronary Atherosclerosis, Hep C,  M.S  Symptoms:  DOE, Palpitations, SOB and Syncope   Nuclear Pre-Procedure Caffeine/Decaff Intake:  None> 12 hrs NPO After: 7:00pm   Lungs:  clear O2 Sat: 96% on room air. IV 0.9% NS with Angio Cath:  22g  IV Site: L Hand x 1, tolerated well IV Started by:  Irven Baltimore, RN  Chest Size (in):  36 Cup Size: D  Height: 5\' 1"  (1.549 m)  Weight:  165 lb (74.844 kg)  BMI:  Body mass index is 31.19 kg/(m^2). Tech Comments:  N/A    Nuclear Med Study 1 or 2 day study: 1 day  Stress Test Type:  Stress  Reading MD: N/A  Order Authorizing Provider:  Candee Furbish, MD  Resting Radionuclide: Technetium 33m Sestamibi  Resting Radionuclide Dose: 11.0 mCi   Stress Radionuclide:  Technetium 91m Sestamibi  Stress Radionuclide Dose: 33.0 mCi           Stress Protocol Rest HR: 59 Stress HR: 150  Rest BP: 133/73 Stress BP: 169/58  Exercise Time (min): 6:00 METS: 7.0   Predicted Max HR: 166 bpm % Max HR: 90.36 bpm Rate Pressure Product: 25350   Dose of Adenosine (mg):  n/a Dose of Lexiscan: n/a mg  Dose of Atropine (mg): n/a Dose of Dobutamine: n/a mcg/kg/min (at max HR)  Stress Test Technologist: Perrin Maltese, EMT-P  Nuclear Technologist:  Earl Many, CNMT     Rest Procedure:  Myocardial perfusion imaging was performed at rest 45 minutes following the intravenous administration of Technetium 25m Sestamibi. Rest ECG: NSR - Normal EKG  Stress Procedure:  The patient exercised on the treadmill utilizing the Bruce Protocol for 6:00 minutes. The patient stopped due to sob,  fatigue, and denied any chest pain.  Technetium 32m Sestamibi was injected at peak exercise and myocardial perfusion imaging was performed after a brief delay. Stress ECG: No significant ST segment change suggestive of ischemia.  QPS Raw Data Images:  Acquisition technically good; normal left ventricular size. Stress Images:  There is decreased uptake in the anterior wall. Rest Images:  There is decreased uptake in the anterior wall. Subtraction (SDS):  No evidence of ischemia. Transient Ischemic Dilatation (Normal <1.22):  0.97 Lung/Heart Ratio (Normal <0.45):  0.43  Quantitative Gated Spect Images QGS EDV:  76 ml QGS ESV:  31 ml  Impression Exercise Capacity:  Fair exercise capacity. BP Response:  Normal blood pressure response. Clinical Symptoms:  There is dyspnea. ECG Impression:  No significant ST segment change suggestive of ischemia. Comparison with Prior Nuclear Study: No previous nuclear study performed  Overall Impression:  Low risk stress nuclear study with a small, moderate intensity, fixed distal anterior/apical defect consistent with soft tissue attenuation; no ischemia.  LV Ejection Fraction: 59%.  LV Wall Motion:  NL LV Function; NL Wall Motion  Kirk Ruths

## 2015-02-22 ENCOUNTER — Telehealth: Payer: Self-pay

## 2015-02-22 NOTE — Telephone Encounter (Signed)
The pt is advised of her normal event monitor results per Dr Marlou Porch. She verbalized understanding.

## 2015-03-08 ENCOUNTER — Encounter: Payer: 59 | Admitting: Nurse Practitioner

## 2015-04-11 ENCOUNTER — Ambulatory Visit (HOSPITAL_COMMUNITY)
Admission: RE | Admit: 2015-04-11 | Discharge: 2015-04-11 | Disposition: A | Discharge status: Home / Self Care | Payer: 59 | Source: Ambulatory Visit | Attending: Gastroenterology | Admitting: Gastroenterology

## 2015-04-11 ENCOUNTER — Encounter (HOSPITAL_COMMUNITY)
Admission: RE | Disposition: A | Discharge status: Home / Self Care | Payer: Self-pay | Source: Ambulatory Visit | Attending: Gastroenterology

## 2015-04-11 DIAGNOSIS — K5900 Constipation, unspecified: Secondary | ICD-10-CM | POA: Diagnosis not present

## 2015-04-11 HISTORY — PX: ANAL RECTAL MANOMETRY: SHX6358

## 2015-04-11 SURGERY — MANOMETRY, ANORECTAL

## 2015-04-12 ENCOUNTER — Encounter (HOSPITAL_COMMUNITY): Payer: Self-pay | Admitting: Gastroenterology

## 2015-04-24 ENCOUNTER — Other Ambulatory Visit: Payer: Self-pay | Admitting: Nurse Practitioner

## 2015-04-24 DIAGNOSIS — C220 Liver cell carcinoma: Secondary | ICD-10-CM

## 2015-04-27 ENCOUNTER — Ambulatory Visit
Admission: RE | Admit: 2015-04-27 | Discharge: 2015-04-27 | Disposition: A | Discharge status: Home / Self Care | Payer: 59 | Source: Ambulatory Visit | Attending: Nurse Practitioner | Admitting: Nurse Practitioner

## 2015-04-27 DIAGNOSIS — C220 Liver cell carcinoma: Secondary | ICD-10-CM

## 2015-07-25 ENCOUNTER — Other Ambulatory Visit: Payer: Self-pay | Admitting: Family Medicine

## 2015-07-25 DIAGNOSIS — E041 Nontoxic single thyroid nodule: Secondary | ICD-10-CM

## 2015-07-28 ENCOUNTER — Other Ambulatory Visit: Payer: 59

## 2015-08-04 ENCOUNTER — Ambulatory Visit
Admission: RE | Admit: 2015-08-04 | Discharge: 2015-08-04 | Disposition: A | Discharge status: Home / Self Care | Payer: Medicare Other | Source: Ambulatory Visit | Attending: Family Medicine | Admitting: Family Medicine

## 2015-08-04 DIAGNOSIS — E041 Nontoxic single thyroid nodule: Secondary | ICD-10-CM

## 2015-12-20 ENCOUNTER — Ambulatory Visit
Admission: RE | Admit: 2015-12-20 | Discharge: 2015-12-20 | Disposition: A | Discharge status: Home / Self Care | Payer: Medicare Other | Source: Ambulatory Visit | Attending: Family Medicine | Admitting: Family Medicine

## 2015-12-20 ENCOUNTER — Other Ambulatory Visit: Payer: Self-pay | Admitting: Family Medicine

## 2015-12-20 DIAGNOSIS — R0600 Dyspnea, unspecified: Secondary | ICD-10-CM

## 2016-02-12 ENCOUNTER — Other Ambulatory Visit: Payer: Self-pay | Admitting: Nurse Practitioner

## 2016-02-12 DIAGNOSIS — K7460 Unspecified cirrhosis of liver: Secondary | ICD-10-CM

## 2016-02-26 ENCOUNTER — Ambulatory Visit
Admission: RE | Admit: 2016-02-26 | Discharge: 2016-02-26 | Disposition: A | Discharge status: Home / Self Care | Payer: Medicare Other | Source: Ambulatory Visit | Attending: Nurse Practitioner | Admitting: Nurse Practitioner

## 2016-02-26 DIAGNOSIS — K7460 Unspecified cirrhosis of liver: Secondary | ICD-10-CM

## 2016-03-06 ENCOUNTER — Encounter (HOSPITAL_COMMUNITY): Payer: Self-pay | Admitting: *Deleted

## 2016-03-06 ENCOUNTER — Observation Stay (HOSPITAL_COMMUNITY)
Admission: EM | Admit: 2016-03-06 | Discharge: 2016-03-07 | Disposition: A | Discharge status: Home / Self Care | Payer: Medicare Other | Attending: Internal Medicine | Admitting: Internal Medicine

## 2016-03-06 DIAGNOSIS — Z981 Arthrodesis status: Secondary | ICD-10-CM | POA: Insufficient documentation

## 2016-03-06 DIAGNOSIS — T40691A Poisoning by other narcotics, accidental (unintentional), initial encounter: Secondary | ICD-10-CM | POA: Diagnosis not present

## 2016-03-06 DIAGNOSIS — F419 Anxiety disorder, unspecified: Secondary | ICD-10-CM | POA: Diagnosis not present

## 2016-03-06 DIAGNOSIS — G459 Transient cerebral ischemic attack, unspecified: Secondary | ICD-10-CM | POA: Insufficient documentation

## 2016-03-06 DIAGNOSIS — G90529 Complex regional pain syndrome I of unspecified lower limb: Secondary | ICD-10-CM | POA: Diagnosis present

## 2016-03-06 DIAGNOSIS — G8929 Other chronic pain: Secondary | ICD-10-CM | POA: Diagnosis not present

## 2016-03-06 DIAGNOSIS — B1920 Unspecified viral hepatitis C without hepatic coma: Secondary | ICD-10-CM | POA: Diagnosis not present

## 2016-03-06 DIAGNOSIS — T40601A Poisoning by unspecified narcotics, accidental (unintentional), initial encounter: Secondary | ICD-10-CM | POA: Diagnosis present

## 2016-03-06 DIAGNOSIS — F17200 Nicotine dependence, unspecified, uncomplicated: Secondary | ICD-10-CM | POA: Diagnosis not present

## 2016-03-06 DIAGNOSIS — Z7982 Long term (current) use of aspirin: Secondary | ICD-10-CM | POA: Diagnosis not present

## 2016-03-06 DIAGNOSIS — G894 Chronic pain syndrome: Secondary | ICD-10-CM | POA: Diagnosis present

## 2016-03-06 DIAGNOSIS — R001 Bradycardia, unspecified: Secondary | ICD-10-CM | POA: Diagnosis not present

## 2016-03-06 DIAGNOSIS — G35 Multiple sclerosis: Secondary | ICD-10-CM | POA: Insufficient documentation

## 2016-03-06 DIAGNOSIS — T50901A Poisoning by unspecified drugs, medicaments and biological substances, accidental (unintentional), initial encounter: Secondary | ICD-10-CM | POA: Diagnosis present

## 2016-03-06 NOTE — ED Provider Notes (Signed)
CSN: [id number]     Arrival date & time 03/06/16  2144 History   First MD Initiated Contact with Patient 03/06/16 2205     Chief Complaint  Patient presents with  . Drug Overdose     (Consider location/radiation/quality/duration/timing/severity/associated sxs/prior Treatment) HPI Patient is a 55 year old female with a history of chronic pain on multiple long-acting narcotic medications who presents after an accidental overdose. She was taking her normal nighttime meds when she accidentally took more than she was supposed to have taken. She reports that she accidentally took too short acting Nucyeta, 100 mg each, and one 250 mg extended release, and her usual dose of levorphanol, clonidine and clonazepam. She denies any SI, depression, homicidal ideations or intent to harm herself, she says this was merely an accident. She called poison control after taking this, and they instructed her to call 911. On EMS arrival he found her to be lethargic but arousable, during transport she became unresponsive with a respiratory rate of 3 so she was given Narcan. She had dramatic improvement in her respiratory status with Narcan, and regained consciousness.    Past Medical History  Diagnosis Date  . Multiple sclerosis ([facility])     dx. -25yrs ago, then med stopped-due to liver enzymes changes,occ. periods of weakness in arms"drops things"  . Heart murmur     Dr. Etter Sjogren  . Bronchitis     hx of  . TIA (transient ischemic attack)     hx of (weakness left side)-none in 1 yr  . Chronic neck pain   . Blood transfusion without reported diagnosis     20 yrs ago after childbirth  . DVT (deep venous thrombosis) ([facility])     rt. leg   . Anxiety 06-16-13    hx. panic attacks, none recent  . Headache(784.0)     migraines-has decreased  . Hepatitis C     dx. Hep. C(s/p transfusion age 65) low Hgb  .-multiple transfusions.  . RSD (reflex sympathetic dystrophy) 06-16-13    legs  . Pneumonia     hx of , ? mild  emphysema on CT scan 5'14, multiple pulmonary nodules   Past Surgical History  Procedure Laterality Date  . Neck surgery    . Cesarean section    . Abdominal hysterectomy    . Appendectomy    . Wedge resection      left ovary with cyst removed ,hx. multiple cysts removed prior  . Anterior cervical decomp/discectomy fusion  09/21/2012    Procedure: ANTERIOR CERVICAL DECOMPRESSION/DISCECTOMY FUSION 2 LEVELS;  Surgeon: Otilio Connors, MD;  Location: [facility];  Service: Neurosurgery;  Laterality: N/A;  Cervical five-six, cervical seven-thoracic one Anterior cervical decompression/diskectomy/fusion/Lifenet bone/trestle plate/Prior Cervical six--seven Anterior cervical fusion/Removing trestle plate  . Ankle surgery Right     no retained hardware  . Esophagogastroduodenoscopy (egd) with propofol N/A 06/30/2013    Procedure: ESOPHAGOGASTRODUODENOSCOPY (EGD) WITH PROPOFOL;  Surgeon: Arta Silence, MD;  Location: [facility];  Service: Endoscopy;  Laterality: N/A;  . Anal rectal manometry N/A 04/11/2015    Procedure: ANO RECTAL MANOMETRY;  Surgeon: Arta Silence, MD;  Location: [facility];  Service: Endoscopy;  Laterality: N/A;   Family History  Problem Relation Age of Onset  . Clotting disorder Sister     lung cancer  . Heart attack Mother 36    with CABG  . Heart disease Mother   . Heart attack Father   . Heart disease Father    Social History  Substance  Use Topics  . Smoking status: Current Every Day Smoker -- 0.25 packs/day for 20 years  . Smokeless tobacco: Never Used  . Alcohol Use: No   OB History    No data available     Review of Systems  Constitutional: Positive for fatigue.  Psychiatric/Behavioral: Negative for self-injury. The patient is not nervous/anxious.   All other systems reviewed and are negative.     Allergies  Lodine  Home Medications   Prior to Admission medications   Medication Sig Start Date End Date Taking? Authorizing Provider   amitriptyline (ELAVIL) 50 MG tablet Take 50 mg by mouth at bedtime as needed for sleep.  12/16/13  Yes Historical Provider, MD  aspirin EC 81 MG tablet Take 162 mg by mouth.   Yes Historical Provider, MD  clonazePAM (KLONOPIN) 0.5 MG tablet Take 0.5-2 mg by mouth 2 (two) times daily as needed for anxiety.    Yes Historical Provider, MD  cloNIDine (CATAPRES) 0.1 MG tablet Take 0.1 mg by mouth every 8 (eight) hours as needed (anxiety).    Yes Historical Provider, MD  cyclobenzaprine (FLEXERIL) 10 MG tablet Take 10 mg by mouth 3 (three) times daily as needed for muscle spasms.    Yes Historical Provider, MD  furosemide (LASIX) 20 MG tablet Take 20 mg by mouth daily as needed for fluid.    Yes Historical Provider, MD  gabapentin (NEURONTIN) 800 MG tablet Take 800 mg by mouth 3 (three) times daily.    Yes Historical Provider, MD  levorphanol (LEVODROMORAN) 2 MG tablet Take 2 mg by mouth every 6 (six) hours as needed for pain.   Yes Historical Provider, MD  Tapentadol HCl (NUCYNTA ER) 250 MG TB12 Take 250 mg by mouth every 12 (twelve) hours.   Yes Historical Provider, MD  Tapentadol HCl 100 MG TABS Take 100 mg by mouth 2 (two) times daily.    Yes Historical Provider, MD   BP 129/70 mmHg  Pulse 63  Temp(Src) 98.8 F (37.1 C) (Oral)  Resp 18  SpO2 97%  LMP 10/16/2011 Physical Exam  Constitutional: She is oriented to person, place, and time. She appears well-developed and well-nourished.  HENT:  Head: Normocephalic and atraumatic.  Eyes:  Pupils 2 mm and sluggish  Neck: No JVD present.  Cardiovascular: Normal rate and regular rhythm.   Pulmonary/Chest: Effort normal. No respiratory distress.  Abdominal: Soft. There is no tenderness.  Musculoskeletal: Normal range of motion. She exhibits no edema.  Neurological: She is alert and oriented to person, place, and time.  Very drowsy, but arousable  Skin: Skin is warm and dry.  Psychiatric: She has a normal mood and affect.  Nursing note and vitals  reviewed.   ED Course  Procedures (including critical care time) Labs Review Labs Reviewed  ETHANOL  ACETAMINOPHEN LEVEL  SALICYLATE LEVEL  CBC  I-STAT CHEM 8, ED    Imaging Review No results found. I have personally reviewed and evaluated these images and lab results as part of my medical decision-making.   EKG Interpretation None      MDM   Final diagnoses:  Opiate overdose, accidental or unintentional, initial encounter    The patient presents with accidental narcotic overdose. There is no concern for self-harm, and the patient denies any other coingestants, other than her regular prescribed medications. She did require Narcan from EMS, and has taken Klonopin along with these long-acting medications. Poison control recommended 8-12 hour observation, additional 6 hours for any further need for Narcan. We  will admit for observation.  We will check ethanol level, acetaminophen level, salicylate level screen for coingestions.    Leata Mouse, MD 03/07/16 0111  Tanna Furry, MD 03/16/16 2206

## 2016-03-06 NOTE — ED Notes (Signed)
Pt arrives via EMS from home. Pt took her night time medications at 2030. Pt normally takes Gabapentin 800mg , Clonidine 0/1mg , Nucynta ER 250mg , Levorphanol 2mg  and Klonopin 2mg . Pt accidentally took additional 200mg  Nucynta. When she realized she had taken the additional dose of Nucynta she called Poison Control who instructed her to call 911. EMS arrived to pts home and found her to be lethargic but arousable. Once she got into the truck pt became unresponsive with respiratory rate 3 so EMS gave 2 Narcan. Pt woke up and respiratory rate returned to 16. EMS reports that pupils initially were pinpoint and after narcan went to 70mm and reactive. Pt alert and lethargic upon arrival to ED. Denies any SI or depression.

## 2016-03-07 ENCOUNTER — Encounter (HOSPITAL_COMMUNITY): Payer: Self-pay | Admitting: General Practice

## 2016-03-07 DIAGNOSIS — T50901A Poisoning by unspecified drugs, medicaments and biological substances, accidental (unintentional), initial encounter: Secondary | ICD-10-CM | POA: Diagnosis not present

## 2016-03-07 DIAGNOSIS — G894 Chronic pain syndrome: Secondary | ICD-10-CM | POA: Diagnosis not present

## 2016-03-07 DIAGNOSIS — G90529 Complex regional pain syndrome I of unspecified lower limb: Secondary | ICD-10-CM

## 2016-03-07 LAB — CBC
HEMATOCRIT: 36.4 % (ref 36.0–46.0)
HEMOGLOBIN: 12.7 g/dL (ref 12.0–15.0)
MCH: 29 pg (ref 26.0–34.0)
MCHC: 34.9 g/dL (ref 30.0–36.0)
MCV: 83.1 fL (ref 78.0–100.0)
Platelets: 187 10*3/uL (ref 150–400)
RBC: 4.38 MIL/uL (ref 3.87–5.11)
RDW: 12.3 % (ref 11.5–15.5)
WBC: 6 10*3/uL (ref 4.0–10.5)

## 2016-03-07 LAB — SALICYLATE LEVEL: Salicylate Lvl: <4 mg/dL (ref 2.8–30.0)

## 2016-03-07 LAB — ETHANOL: Alcohol, Ethyl (B): <5 mg/dL (ref <5)

## 2016-03-07 LAB — ACETAMINOPHEN LEVEL: Acetaminophen (Tylenol), Serum: <10 ug/mL — ABNORMAL LOW (ref 10–30)

## 2016-03-07 MED ORDER — ASPIRIN EC 81 MG PO TBEC: 162.0000 mg | DELAYED_RELEASE_TABLET | Freq: Every day | ORAL | Status: DC

## 2016-03-07 MED ORDER — CLONAZEPAM 0.5 MG PO TABS: 0.5000 mg | ORAL_TABLET | Freq: Two times a day (BID) | ORAL | Status: DC | PRN

## 2016-03-07 MED ORDER — ONDANSETRON HCL 4 MG/2ML IJ SOLN: 4.0000 mg | Freq: Four times a day (QID) | INTRAMUSCULAR | Status: DC | PRN

## 2016-03-07 MED ORDER — FUROSEMIDE 20 MG PO TABS: 20.0000 mg | ORAL_TABLET | Freq: Every day | ORAL | Status: DC | PRN

## 2016-03-07 MED ORDER — SODIUM CHLORIDE 0.9% FLUSH
3.0000 mL | Freq: Two times a day (BID) | INTRAVENOUS | Status: DC
  Administered 2016-03-07: 3 mL via INTRAVENOUS

## 2016-03-07 MED ORDER — GABAPENTIN 400 MG PO CAPS: 800.0000 mg | ORAL_CAPSULE | Freq: Three times a day (TID) | ORAL | Status: DC

## 2016-03-07 MED ORDER — ENOXAPARIN SODIUM 40 MG/0.4ML ~~LOC~~ SOLN: 40.0000 mg | SUBCUTANEOUS | Status: DC

## 2016-03-07 MED ORDER — ONDANSETRON HCL 4 MG PO TABS: 4.0000 mg | ORAL_TABLET | Freq: Four times a day (QID) | ORAL | Status: DC | PRN

## 2016-03-07 MED ORDER — ALBUTEROL SULFATE (2.5 MG/3ML) 0.083% IN NEBU: 2.5000 mg | INHALATION_SOLUTION | RESPIRATORY_TRACT | Status: DC | PRN

## 2016-03-07 MED ORDER — GABAPENTIN 600 MG PO TABS: 600.0000 mg | ORAL_TABLET | Freq: Two times a day (BID) | ORAL | Status: DC

## 2016-03-07 MED ORDER — LEVORPHANOL TARTRATE 2 MG PO TABS
2.0000 mg | ORAL_TABLET | Freq: Four times a day (QID) | ORAL | Status: DC | PRN
Start: 2016-03-07 — End: 2016-03-07

## 2016-03-07 MED ORDER — AMITRIPTYLINE HCL 50 MG PO TABS
50.0000 mg | ORAL_TABLET | Freq: Every evening | ORAL | Status: DC | PRN
  Filled 2016-03-07: qty 1

## 2016-03-07 MED ORDER — TAPENTADOL HCL ER 250 MG PO TB12: 250.0000 mg | ORAL_TABLET | Freq: Two times a day (BID) | ORAL | Status: DC

## 2016-03-07 MED ORDER — CLONIDINE HCL 0.1 MG PO TABS: 0.1000 mg | ORAL_TABLET | Freq: Three times a day (TID) | ORAL | Status: DC | PRN

## 2016-03-07 MED ORDER — SODIUM CHLORIDE 0.9 % IV SOLN
Freq: Once | INTRAVENOUS | Status: AC
  Administered 2016-03-07: 03:00:00 via INTRAVENOUS

## 2016-03-07 NOTE — Discharge Summary (Signed)
Physician Discharge Summary  TAELER ALMONOR [id number] DOB: 11/30/1960 DOA: 03/06/2016  PCP: Mayra Neer, MD  Admit date: 03/06/2016 Discharge date: 03/07/2016  Recommendations for Outpatient Follow-up:  1. Pt will need to follow up with PCP as soon as possible 2. Please obtain BMP to evaluate electrolytes and kidney function 3. Please also check CBC to evaluate Hg and Hct levels 4. Please note that pt insisted on going home, no SI< family in the room with same request    Discharge Diagnoses:  Active Problems:   Chronic pain disorder   RSD lower limb   Accidental overdose  Discharge Condition: Stable  Diet recommendation: Heart healthy diet discussed in details   History of present illness:  55 y.o. female with medical history significant of hepatitis C 2/2 transfusion, anxiety, reported cirrhosis stage IV, reflexive dystrophy, status post cervical fusion, chronic pain; who presented after accidentally taking two of her short acting Nuceyta 100 mg tablets. Patient had also taken her regular medications Nuceyta 250 mg extended release, and her usual dose of levorphanol, clonidine and clonazepam. Patient noted that after she realized she took an extra dose of the short acting Nuceyta That she immediately called poison control who instructed her to call 911. Denies any suicidal ideations or thoughts of wanting to harm herself.    Hospital Course:  Active Problems:   Chronic pain disorder   RSD lower limb   Accidental overdose - pt reports feeling better this AM< denies SI - wants to go home, poison control center notified  - VS notable for HR 45 but this was rechecked and HR in low 60's (checked on two separate occasions before discharge)   Procedures/Studies: US Abdomen Limited Ruq  Mar 26, 2016  CLINICAL DATA:  Hepatic cirrhosis, history of hepatitis-C. EXAM: US ABDOMEN LIMITED - RIGHT UPPER QUADRANT COMPARISON:  Abdominal ultrasound of April 27, 2015 FINDINGS: Gallbladder:  No gallstones or wall thickening visualized. No sonographic Murphy sign noted by sonographer. Common bile duct: Diameter: 5.8 mm Liver: The hepatic echotexture is mildly increased in somewhat heterogeneous. There is no discrete mass or ductal dilation. The surface contour irregularity described previously is not clearly evident on today's study. IMPRESSION: Persistent echogenic heterogeneous hepatic echotexture little changed from the previous study consistent with mild cirrhosis as demonstrated on the June 2015 MRI and June 2016 ultrasound. No hepatic mass is observed. The gallbladder is unremarkable. Electronically Signed   By: David  Martinique M.D.   On: 2016/03/26 10:47   Discharge Exam: Filed Vitals:   03/07/16 0435 03/07/16 0756  BP: 130/67 122/60  Pulse: 51 49  Temp: 98.1 F (36.7 C) 98 F (36.7 C)  Resp: 16 16   Filed Vitals:   03/07/16 0227 03/07/16 0254 03/07/16 0435 03/07/16 0756  BP:  144/75 130/67 122/60  Pulse: 60 55 51 49  Temp:  97.9 F (36.6 C) 98.1 F (36.7 C) 98 F (36.7 C)  TempSrc:  Oral Oral Oral  Resp: 16 19 16 16   Height:  5\' 1"  (1.549 m)    Weight:  67.858 kg (149 lb 9.6 oz)    SpO2: 96% 100% 99% 98%    General: Pt is alert, follows commands appropriately, not in acute distress Cardiovascular: Regular rate and rhythm, S1/S2 +, no murmurs, no rubs, no gallops Respiratory: Clear to auscultation bilaterally, no wheezing, no crackles, no rhonchi Abdominal: Soft, non tender, non distended, bowel sounds +, no guarding  Discharge Instructions  Discharge Instructions    Diet - low sodium heart healthy  Complete by:  As directed      Increase activity slowly    Complete by:  As directed             Medication List    STOP taking these medications        cyclobenzaprine 10 MG tablet  Commonly known as:  FLEXERIL      TAKE these medications        amitriptyline 50 MG tablet  Commonly known as:  ELAVIL  Take 50 mg by mouth at bedtime as needed for  sleep.     aspirin EC 81 MG tablet  Take 162 mg by mouth.     clonazePAM 0.5 MG tablet  Commonly known as:  KLONOPIN  Take 0.5-2 mg by mouth 2 (two) times daily as needed for anxiety.     cloNIDine 0.1 MG tablet  Commonly known as:  CATAPRES  Take 0.1 mg by mouth every 8 (eight) hours as needed (anxiety).     furosemide 20 MG tablet  Commonly known as:  LASIX  Take 20 mg by mouth daily as needed for fluid.     gabapentin 600 MG tablet  Commonly known as:  NEURONTIN  Take 1 tablet (600 mg total) by mouth 2 (two) times daily.     levorphanol 2 MG tablet  Commonly known as:  LEVODROMORAN  Take 2 mg by mouth every 6 (six) hours as needed for pain.     NUCYNTA ER 250 MG Tb12  Generic drug:  Tapentadol HCl  Take 250 mg by mouth every 12 (twelve) hours.           Follow-up Information    Follow up with Mayra Neer, MD.   Specialty:  Family Medicine   Contact information:   [place] [place] [address] [phone]        The results of significant diagnostics from this hospitalization (including imaging, microbiology, ancillary and laboratory) are listed below for reference.     Microbiology: No results found for this or any previous visit (from the past 240 hour(s)).   Labs: Basic Metabolic Panel: No results for input(s): NA, K, CL, CO2, GLUCOSE, BUN, CREATININE, CALCIUM, MG, PHOS in the last 168 hours. Liver Function Tests: No results for input(s): AST, ALT, ALKPHOS, BILITOT, PROT, ALBUMIN in the last 168 hours. No results for input(s): LIPASE, AMYLASE in the last 168 hours. No results for input(s): AMMONIA in the last 168 hours. CBC:  Recent Labs Lab 03/07/16 0121  WBC 6.0  HGB 12.7  HCT 36.4  MCV 83.1  PLT 187   Cardiac Enzymes: No results for input(s): CKTOTAL, CKMB, CKMBINDEX, TROPONINI in the last 168 hours. BNP: BNP (last 3 results) No results for input(s): BNP in the last 8760 hours.  ProBNP (last 3 results) No  results for input(s): PROBNP in the last 8760 hours.  CBG: No results for input(s): GLUCAP in the last 168 hours.   SIGNED: Time coordinating discharge: 30 minutes  Faye Ramsay, MD  [facility] 03/07/2016, 11:51 AM Pager [phone]  If 7PM-7AM, please contact night-coverage [email or web address] Password [email or web address]

## 2016-03-07 NOTE — Discharge Instructions (Signed)

## 2016-03-07 NOTE — H&P (Signed)
History and Physical    Becky Wolfe [id number] DOB: June 22, 1961 DOA: 03/06/2016  Referring MD/NP/PA: Dr. Leata Mouse PCP: Mayra Neer, MD  Outpatient Specialists: Dr. Marlou Porch, Cardiology Patient coming from: Home  Chief Complaint:  Accidental Overdose  HPI: Becky Wolfe is a 55 y.o. female with medical history significant of hepatitis C 2/2 transfusion, anxiety, reported cirrhosis stage IV, reflexive dystrophy, status post cervical fusion, chronic pain; who presented after accidentally taking two of her short acting Nuceyta 100 mg tablets. Patient had also taken her regular medications Nuceyta 250 mg extended release, and her usual dose of levorphanol, clonidine and clonazepam. Patient noted that after she realized she took an extra dose of the short acting Nuceyta  That she immediately called poison control who instructed her to call 911. Denies any suicidal ideations or thoughts of wanting to harm herself.    ED Course: Upon EMS arrival patient was found to be very lethargic, but arousable. She had become unresponsive in route with respirations as low as 3 requiring 2 doses of Narcan to be given.   Review of Systems: As per HPI otherwise 10 point review of systems negative.    Past Medical History  Diagnosis Date  . Multiple sclerosis ([place])     dx. -17yrs ago, then med stopped-due to liver enzymes changes,occ. periods of weakness in arms"drops things"  . Heart murmur     Dr. Etter Sjogren  . Bronchitis     hx of  . TIA (transient ischemic attack)     hx of (weakness left side)-none in 1 yr  . Chronic neck pain   . Blood transfusion without reported diagnosis     20 yrs ago after childbirth  . DVT (deep venous thrombosis) ([facility])     rt. leg   . Anxiety 06-16-13    hx. panic attacks, none recent  . Headache(784.0)     migraines-has decreased  . Hepatitis C     dx. Hep. C(s/p transfusion age 39) low Hgb  .-multiple transfusions.  . RSD (reflex sympathetic dystrophy) 06-16-13   legs  . Pneumonia     hx of , ? mild emphysema on CT scan 5'14, multiple pulmonary nodules    Past Surgical History  Procedure Laterality Date  . Neck surgery    . Cesarean section    . Abdominal hysterectomy    . Appendectomy    . Wedge resection      left ovary with cyst removed ,hx. multiple cysts removed prior  . Anterior cervical decomp/discectomy fusion  09/21/2012    Procedure: ANTERIOR CERVICAL DECOMPRESSION/DISCECTOMY FUSION 2 LEVELS;  Surgeon: Otilio Connors, MD;  Location: [facility];  Service: Neurosurgery;  Laterality: N/A;  Cervical five-six, cervical seven-thoracic one Anterior cervical decompression/diskectomy/fusion/Lifenet bone/trestle plate/Prior Cervical six--seven Anterior cervical fusion/Removing trestle plate  . Ankle surgery Right     no retained hardware  . Esophagogastroduodenoscopy (egd) with propofol N/A 06/30/2013    Procedure: ESOPHAGOGASTRODUODENOSCOPY (EGD) WITH PROPOFOL;  Surgeon: Arta Silence, MD;  Location: [facility];  Service: Endoscopy;  Laterality: N/A;  . Anal rectal manometry N/A 04/11/2015    Procedure: ANO RECTAL MANOMETRY;  Surgeon: Arta Silence, MD;  Location: [facility];  Service: Endoscopy;  Laterality: N/A;     reports that she has been smoking.  She has never used smokeless tobacco. She reports that she does not drink alcohol or use illicit drugs.  Allergies  Allergen Reactions  . Lodine [Etodolac] Swelling    Facial swelling Facial swelling  Family History  Problem Relation Age of Onset  . Clotting disorder Sister     lung cancer  . Heart attack Mother 23    with CABG  . Heart disease Mother   . Heart attack Father   . Heart disease Father     Prior to Admission medications   Medication Sig Start Date End Date Taking? Authorizing Provider  amitriptyline (ELAVIL) 50 MG tablet Take 50 mg by mouth at bedtime as needed for sleep.  12/16/13  Yes Historical Provider, MD  aspirin EC 81 MG tablet Take 162 mg by mouth.    Yes Historical Provider, MD  clonazePAM (KLONOPIN) 0.5 MG tablet Take 0.5-2 mg by mouth 2 (two) times daily as needed for anxiety.    Yes Historical Provider, MD  cloNIDine (CATAPRES) 0.1 MG tablet Take 0.1 mg by mouth every 8 (eight) hours as needed (anxiety).    Yes Historical Provider, MD  cyclobenzaprine (FLEXERIL) 10 MG tablet Take 10 mg by mouth 3 (three) times daily as needed for muscle spasms.    Yes Historical Provider, MD  furosemide (LASIX) 20 MG tablet Take 20 mg by mouth daily as needed for fluid.    Yes Historical Provider, MD  gabapentin (NEURONTIN) 800 MG tablet Take 800 mg by mouth 3 (three) times daily.    Yes Historical Provider, MD  levorphanol (LEVODROMORAN) 2 MG tablet Take 2 mg by mouth every 6 (six) hours as needed for pain.   Yes Historical Provider, MD  Tapentadol HCl (NUCYNTA ER) 250 MG TB12 Take 250 mg by mouth every 12 (twelve) hours.   Yes Historical Provider, MD  Tapentadol HCl 100 MG TABS Take 100 mg by mouth 2 (two) times daily.    Yes Historical Provider, MD    Physical Exam: Filed Vitals:   03/06/16 2215 03/06/16 2230 03/06/16 2245 03/06/16 2300  BP: 132/72 114/68 121/72 129/70  Pulse: 60 63 61 63  Temp:      TempSrc:      Resp: 17 9 9 18   SpO2: 96% 97% 94% 97%      Constitutional: Lethargic, but arousable to verbal stimuli Filed Vitals:   03/06/16 2215 03/06/16 2230 03/06/16 2245 03/06/16 2300  BP: 132/72 114/68 121/72 129/70  Pulse: 60 63 61 63  Temp:      TempSrc:      Resp: 17 9 9 18   SpO2: 96% 97% 94% 97%   Eyes: PERRL, lids and conjunctivae normal ENMT: Mucous membranes are moist. Posterior pharynx clear of any exudate or lesions.Normal dentition.  Neck: normal, supple, no masses, no thyromegaly Respiratory: clear to auscultation bilaterally, no wheezing, no crackles. Normal respiratory effort. No accessory muscle use.  Cardiovascular: Regular rate and rhythm, no murmurs / rubs / gallops. No extremity edema. 2+ pedal pulses. No carotid  bruits.  Abdomen: no tenderness, no masses palpated. No hepatosplenomegaly. Bowel sounds positive.  Musculoskeletal: no clubbing / cyanosis. No joint deformity upper and lower extremities. Good ROM, no contractures. Normal muscle tone.  Skin: no rashes, lesions, ulcers. No induration Neurologic: CN 2-12 grossly intact. Sensation intact, DTR normal. Strength 5/5 in all 4.  Psychiatric: Normal judgment and insight. Lethargic, but easily arousable.   Labs on Admission: I have personally reviewed following labs and imaging studies  CBC: No results for input(s): WBC, NEUTROABS, HGB, HCT, MCV, PLT in the last 168 hours. Basic Metabolic Panel: No results for input(s): NA, K, CL, CO2, GLUCOSE, BUN, CREATININE, CALCIUM, MG, PHOS in the last 168 hours. GFR:  CrCl cannot be calculated (Unknown ideal weight.). Liver Function Tests: No results for input(s): AST, ALT, ALKPHOS, BILITOT, PROT, ALBUMIN in the last 168 hours. No results for input(s): LIPASE, AMYLASE in the last 168 hours. No results for input(s): AMMONIA in the last 168 hours. Coagulation Profile: No results for input(s): INR, PROTIME in the last 168 hours. Cardiac Enzymes: No results for input(s): CKTOTAL, CKMB, CKMBINDEX, TROPONINI in the last 168 hours. BNP (last 3 results) No results for input(s): PROBNP in the last 8760 hours. HbA1C: No results for input(s): HGBA1C in the last 72 hours. CBG: No results for input(s): GLUCAP in the last 168 hours. Lipid Profile: No results for input(s): CHOL, HDL, LDLCALC, TRIG, CHOLHDL, LDLDIRECT in the last 72 hours. Thyroid Function Tests: No results for input(s): TSH, T4TOTAL, FREET4, T3FREE, THYROIDAB in the last 72 hours. Anemia Panel: No results for input(s): VITAMINB12, FOLATE, FERRITIN, TIBC, IRON, RETICCTPCT in the last 72 hours. Urine analysis:    Component Value Date/Time   COLORURINE YELLOW 09/02/2012 1917   APPEARANCEUR HAZY* 09/02/2012 1917   LABSPEC 1.014 09/02/2012 1917    PHURINE 6.0 09/02/2012 1917   GLUCOSEU NEGATIVE 09/02/2012 1917   HGBUR NEGATIVE 09/02/2012 [place] NEGATIVE 09/02/2012 1917   KETONESUR NEGATIVE 09/02/2012 1917   PROTEINUR NEGATIVE 09/02/2012 1917   UROBILINOGEN 1.0 09/02/2012 1917   NITRITE NEGATIVE 09/02/2012 1917   LEUKOCYTESUR NEGATIVE 09/02/2012 1917   Sepsis Labs: @LABRCNTIP (procalcitonin:4,lacticidven:4) )No results found for this or any previous visit (from the past 240 hour(s)).   Radiological Exams on Admission: No results found.  EKG: Independently reviewed. Sinus rhythm with a heart rate of 52  Assessment/Plan Accidental ingestion: Patient reports accidentally taking 1 additional dose of her 100 mg tablet of Nucynta. Patient's denies any suicidal ideations and her immediate call to poison control collaborates this story. Poison control recommended monitoring for respiratory depression. - Admit to a telemetry bed - Continuous pulse oximetry with cannula oxygen to keep O2 sats greater than 92% - Hold all home medications for now until patient more alert - will give additional Narcan for significant respiratory depression  Sinus bradycardia: heart rates maintained in the 50s  - Follow-up telemetry  History of chronic pain with RSD of the lower extremities -Continue to monitor   DVT prophylaxis: Lovenox Code Status: Full Family Communication: Discussed plan with husband Disposition Plan: Likely discharge in a.m. Consults called: none Admission status: Observation telemetry   Norval Morton MD [facility] Pager [phone]  If 7PM-7AM, please contact night-coverage [email or web address] Password [employer]  03/07/2016, 12:35 AM

## 2016-03-07 NOTE — Progress Notes (Signed)
CONSULT FOR MEDICATION ASSISTANCE  Patient has private insurance with [facility] with prescription drug coverage; CM unable to provide any assistance due to patient has coverage for medication.  Member: Becky Wolfe, Becky Wolfe [id number]  Plan: [facility] MEDICARE [id number] Payor: [place] M*    Coverage Information    Coverage information:     Subscriber: [id number] Becky Wolfe     Rel to sub: 01 - Self     Member ID: [id number]     Payor: 269-[employer]ICARE     Benefit plan: [id number] MEDICARE Ph: [phone]     Group number: [id number]     Member effective dates: from 06/12/15

## 2016-03-07 NOTE — Progress Notes (Signed)
Cheryl from [place] control called to check on patient status. She will call back to check again this afternoon. Will continue to monitor.

## 2016-03-07 NOTE — ED Provider Notes (Signed)
Pt seen and evaluated.  Pt resting eyes closed, but awakens to voice.  RR adequate.  Saturating well.  Pt on Nucynta ER, Levorphanol, and kloepin.  Stated accidental ingestion ov 2 doses Nycynta ER.  Had resp near arrest c EMS and required Narcan rescue.  Poison control recommends 8 hour Obs considering long T1/2.  Tanna Furry, MD 03/07/16 [phone]

## 2016-03-07 NOTE — Progress Notes (Signed)
Pt seen and examined at bedside, admitted after midnight, please see earlier admission note by Dr. Tamala Julian. Stable, vs notable for mild bradycardia, possible d/c later in the afternoon.   Faye Ramsay, MD  [facility] Pager [phone]  If 7PM-7AM, please contact night-coverage [email or web address] Password [email or web address]

## 2016-03-13 ENCOUNTER — Other Ambulatory Visit: Payer: Self-pay | Admitting: Gastroenterology

## 2016-03-13 DIAGNOSIS — K7460 Unspecified cirrhosis of liver: Secondary | ICD-10-CM

## 2016-03-25 ENCOUNTER — Ambulatory Visit
Admission: RE | Admit: 2016-03-25 | Discharge: 2016-03-25 | Disposition: A | Discharge status: Home / Self Care | Payer: Medicare Other | Source: Ambulatory Visit | Attending: Gastroenterology | Admitting: Gastroenterology

## 2016-03-25 DIAGNOSIS — K7460 Unspecified cirrhosis of liver: Secondary | ICD-10-CM

## 2016-08-13 ENCOUNTER — Other Ambulatory Visit: Payer: Self-pay | Admitting: Nurse Practitioner

## 2016-08-13 DIAGNOSIS — K7469 Other cirrhosis of liver: Secondary | ICD-10-CM

## 2016-08-27 ENCOUNTER — Other Ambulatory Visit: Payer: Medicare Other

## 2016-09-03 ENCOUNTER — Ambulatory Visit
Admission: RE | Admit: 2016-09-03 | Discharge: 2016-09-03 | Disposition: A | Discharge status: Home / Self Care | Payer: Medicare Other | Source: Ambulatory Visit | Attending: Nurse Practitioner | Admitting: Nurse Practitioner

## 2016-09-03 DIAGNOSIS — K7469 Other cirrhosis of liver: Secondary | ICD-10-CM

## 2016-10-08 ENCOUNTER — Emergency Department (HOSPITAL_COMMUNITY): Payer: Medicare Other

## 2016-10-08 ENCOUNTER — Emergency Department (HOSPITAL_COMMUNITY)
Admission: EM | Admit: 2016-10-08 | Discharge: 2016-10-08 | Disposition: A | Discharge status: Home / Self Care | Payer: Medicare Other | Attending: Emergency Medicine | Admitting: Emergency Medicine

## 2016-10-08 ENCOUNTER — Encounter (HOSPITAL_COMMUNITY): Payer: Self-pay

## 2016-10-08 DIAGNOSIS — G4485 Primary stabbing headache: Secondary | ICD-10-CM

## 2016-10-08 DIAGNOSIS — F17200 Nicotine dependence, unspecified, uncomplicated: Secondary | ICD-10-CM | POA: Diagnosis not present

## 2016-10-08 DIAGNOSIS — R51 Headache: Secondary | ICD-10-CM | POA: Insufficient documentation

## 2016-10-08 DIAGNOSIS — Z79899 Other long term (current) drug therapy: Secondary | ICD-10-CM | POA: Insufficient documentation

## 2016-10-08 DIAGNOSIS — Z7982 Long term (current) use of aspirin: Secondary | ICD-10-CM | POA: Diagnosis not present

## 2016-10-08 DIAGNOSIS — Z8673 Personal history of transient ischemic attack (TIA), and cerebral infarction without residual deficits: Secondary | ICD-10-CM | POA: Diagnosis not present

## 2016-10-08 DIAGNOSIS — R519 Headache, unspecified: Secondary | ICD-10-CM

## 2016-10-08 LAB — CSF CELL COUNT WITH DIFFERENTIAL
Eosinophils, CSF: 0 % (ref 0–1)
Eosinophils, CSF: 0 % (ref 0–1)
RBC Count, CSF: 1 /mm3 — ABNORMAL HIGH
RBC Count, CSF: 8 /mm3 — ABNORMAL HIGH
Tube #: 1
Tube #: 4
WBC, CSF: 1 /mm3 (ref 0–5)
WBC, CSF: 2 /mm3 (ref 0–5)

## 2016-10-08 LAB — BASIC METABOLIC PANEL
Anion gap: 7 (ref 5–15)
BUN: 7 mg/dL (ref 6–20)
CO2: 23 mmol/L (ref 22–32)
Calcium: 9.2 mg/dL (ref 8.9–10.3)
Chloride: 111 mmol/L (ref 101–111)
Creatinine, Ser: 0.79 mg/dL (ref 0.44–1.00)
GFR calc Af Amer: >60 mL/min (ref >60)
GFR calc non Af Amer: >60 mL/min (ref >60)
Glucose, Bld: 98 mg/dL (ref 65–99)
Potassium: 4 mmol/L (ref 3.5–5.1)
Sodium: 141 mmol/L (ref 135–145)

## 2016-10-08 LAB — GLUCOSE, CSF: Glucose, CSF: 53 mg/dL (ref 40–70)

## 2016-10-08 LAB — CBC WITH DIFFERENTIAL/PLATELET
Basophils Absolute: 0 10*3/uL (ref 0.0–0.1)
Basophils Relative: 1 %
Eosinophils Absolute: 0.1 10*3/uL (ref 0.0–0.7)
Eosinophils Relative: 1 %
HCT: 38.4 % (ref 36.0–46.0)
Hemoglobin: 13.7 g/dL (ref 12.0–15.0)
Lymphocytes Relative: 51 %
Lymphs Abs: 3.1 10*3/uL (ref 0.7–4.0)
MCH: 30.1 pg (ref 26.0–34.0)
MCHC: 35.7 g/dL (ref 30.0–36.0)
MCV: 84.4 fL (ref 78.0–100.0)
Monocytes Absolute: 0.4 10*3/uL (ref 0.1–1.0)
Monocytes Relative: 6 %
Neutro Abs: 2.6 10*3/uL (ref 1.7–7.7)
Neutrophils Relative %: 41 %
Platelets: 193 10*3/uL (ref 150–400)
RBC: 4.55 MIL/uL (ref 3.87–5.11)
RDW: 12.5 % (ref 11.5–15.5)
WBC: 6.2 10*3/uL (ref 4.0–10.5)

## 2016-10-08 LAB — PROTEIN, CSF: Total  Protein, CSF: 31 mg/dL (ref 15–45)

## 2016-10-08 MED ORDER — DIAZEPAM 5 MG/ML IJ SOLN
5.0000 mg | Freq: Once | INTRAMUSCULAR | Status: AC
  Administered 2016-10-08: 5 mg via INTRAVENOUS
  Filled 2016-10-08: qty 2

## 2016-10-08 MED ORDER — DIAZEPAM 5 MG PO TABS
5.0000 mg | ORAL_TABLET | Freq: Once | ORAL | Status: AC
  Administered 2016-10-08: 5 mg via ORAL
  Filled 2016-10-08: qty 1

## 2016-10-08 MED ORDER — HYDROMORPHONE HCL 2 MG/ML IJ SOLN
1.0000 mg | Freq: Once | INTRAMUSCULAR | Status: AC
  Administered 2016-10-08: 1 mg via INTRAVENOUS
  Filled 2016-10-08: qty 1

## 2016-10-08 MED ORDER — PROCHLORPERAZINE EDISYLATE 5 MG/ML IJ SOLN
10.0000 mg | Freq: Once | INTRAMUSCULAR | Status: AC
  Administered 2016-10-08: 10 mg via INTRAVENOUS
  Filled 2016-10-08: qty 2

## 2016-10-08 MED ORDER — BUTALBITAL-APAP-CAFFEINE 50-325-40 MG PO TABS: 1.0000 | ORAL_TABLET | Freq: Four times a day (QID) | ORAL | 0 refills | Status: AC | PRN

## 2016-10-08 MED ORDER — MORPHINE SULFATE (PF) 4 MG/ML IV SOLN
4.0000 mg | Freq: Once | INTRAVENOUS | Status: AC
  Administered 2016-10-08: 4 mg via INTRAVENOUS
  Filled 2016-10-08: qty 1

## 2016-10-08 MED ORDER — IOPAMIDOL (ISOVUE-370) INJECTION 76%
INTRAVENOUS | Status: AC
Start: 2016-10-08 — End: 2016-10-08
  Administered 2016-10-08: 50 mL
  Filled 2016-10-08: qty 50

## 2016-10-08 MED ORDER — SODIUM CHLORIDE 0.9 % IV BOLUS (SEPSIS)
1000.0000 mL | Freq: Once | INTRAVENOUS | Status: AC
Start: 2016-10-08 — End: 2016-10-08
  Administered 2016-10-08: 1000 mL via INTRAVENOUS

## 2016-10-08 NOTE — ED Notes (Signed)
Pt updated on delay of IR procedure. md kohut called and spoke with MD who was going to do procedure.

## 2016-10-08 NOTE — ED Notes (Signed)
Patient undressed, in gown, on monitor, continuous pulse oximetry and blood pressure cuff; visitor at bedside 

## 2016-10-08 NOTE — ED Notes (Signed)
Called xray and stated that they were setting the table up and would be over shortly to get pt. Pt and family updated on delay

## 2016-10-08 NOTE — ED Notes (Signed)
Patient physically moved from fast track 8 to A13 with family in tow

## 2016-10-08 NOTE — ED Triage Notes (Signed)
Patient complains of headache and neck pain with congestion x 1 week. States that she has hx of migraines but this headache is different. Sent from urgent care for further evaluation

## 2016-10-08 NOTE — ED Provider Notes (Signed)
[facility] DEPT Provider Note   CSN: [id number] Arrival date & time: 10/08/16  1122   By signing my name below, I, Evelene Croon, attest that this documentation has been prepared under the direction and in the presence of Virgel Manifold, MD . Electronically Signed: Evelene Croon, Scribe. 10/08/2016. 1:20 PM.   History   Chief Complaint Chief Complaint  Patient presents with  . Headache  . Neck Pain    The history is provided by the patient. No language interpreter was used.    HPI Comments:  Becky Wolfe is a 55 y.o. female with a history of chronic neck pain secondary to neck surgery, and RSD, who presents to the Emergency Department complaining of a HA x 2 days. She describes pain as an "explosive" pain "deep" in the back of her head and pain behind the right eye. Pt has a h/o migraines, notes pain today is dissimilar. She reports associated phonophobia, photophobia, neck pain, nasal congestion and sore throat. Her pain is exacerbated when yawing, or sneezing. Pt was sent from Urgent care  to be evaluated for meningitis. She has taken mucinex sinus without relief. No fever. Pt reports multiple sick contacts at home--son and husband. Pt has a h/o PE; no current use of anticoagulants.      Past Medical History:  Diagnosis Date  . Anxiety 06-16-13   hx. panic attacks, none recent  . Blood transfusion without reported diagnosis    20 yrs ago after childbirth  . Bronchitis    hx of  . Chronic neck pain   . DVT (deep venous thrombosis) (HCC)    rt. leg   . Headache(784.0)    migraines-has decreased  . Heart murmur    Dr. Etter Sjogren  . Hepatitis C    dx. Hep. C(s/p transfusion age 74) low Hgb  .-multiple transfusions.  . Multiple sclerosis ([place])    dx. -52yrs ago, then med stopped-due to liver enzymes changes,occ. periods of weakness in arms"drops things"  . Pneumonia    hx of , ? mild emphysema on CT scan 5'14, multiple pulmonary nodules  . RSD (reflex sympathetic  dystrophy) 06-16-13   legs  . TIA (transient ischemic attack)    hx of (weakness left side)-none in 1 yr    Patient Active Problem List   Diagnosis Date Noted  . Accidental overdose 03/07/2016  . Atherosclerosis of native coronary artery of native heart without angina pectoris 02/10/2015  . History of DVT (deep vein thrombosis) 02/10/2015  . Tobacco abuse 02/10/2015  . Right leg pain 04/23/2013  . DVT (deep vein thrombosis) in pregnancy ([place]) 04/23/2013  . Chronic pain disorder 04/23/2013  . RSD lower limb 04/23/2013  . Chronic anticoagulation 04/23/2013    Past Surgical History:  Procedure Laterality Date  . ABDOMINAL HYSTERECTOMY    . ANAL RECTAL MANOMETRY N/A 04/11/2015   Procedure: ANO RECTAL MANOMETRY;  Surgeon: Arta Silence, MD;  Location: [facility];  Service: Endoscopy;  Laterality: N/A;  . ANKLE SURGERY Right    no retained hardware  . ANTERIOR CERVICAL DECOMP/DISCECTOMY FUSION  09/21/2012   Procedure: ANTERIOR CERVICAL DECOMPRESSION/DISCECTOMY FUSION 2 LEVELS;  Surgeon: Otilio Connors, MD;  Location: [facility];  Service: Neurosurgery;  Laterality: N/A;  Cervical five-six, cervical seven-thoracic one Anterior cervical decompression/diskectomy/fusion/Lifenet bone/trestle plate/Prior Cervical six--seven Anterior cervical fusion/Removing trestle plate  . APPENDECTOMY    . CESAREAN SECTION    . ESOPHAGOGASTRODUODENOSCOPY (EGD) WITH PROPOFOL N/A 06/30/2013   Procedure: ESOPHAGOGASTRODUODENOSCOPY (EGD) WITH PROPOFOL;  Surgeon:  Arta Silence, MD;  Location: [facility];  Service: Endoscopy;  Laterality: N/A;  . NECK SURGERY    . WEDGE RESECTION     left ovary with cyst removed ,hx. multiple cysts removed prior    OB History    No data available       Home Medications    Prior to Admission medications   Medication Sig Start Date End Date Taking? Authorizing Provider  amitriptyline (ELAVIL) 50 MG tablet Take 50 mg by mouth at bedtime as needed for sleep.  12/16/13    Historical Provider, MD  aspirin EC 81 MG tablet Take 162 mg by mouth.    Historical Provider, MD  clonazePAM (KLONOPIN) 0.5 MG tablet Take 0.5-2 mg by mouth 2 (two) times daily as needed for anxiety.     Historical Provider, MD  cloNIDine (CATAPRES) 0.1 MG tablet Take 0.1 mg by mouth every 8 (eight) hours as needed (anxiety).     Historical Provider, MD  furosemide (LASIX) 20 MG tablet Take 20 mg by mouth daily as needed for fluid.     Historical Provider, MD  gabapentin (NEURONTIN) 600 MG tablet Take 1 tablet (600 mg total) by mouth 2 (two) times daily. 03/07/16   Theodis Blaze, MD  levorphanol (LEVODROMORAN) 2 MG tablet Take 2 mg by mouth every 6 (six) hours as needed for pain.    Historical Provider, MD  Tapentadol HCl (NUCYNTA ER) 250 MG TB12 Take 250 mg by mouth every 12 (twelve) hours.    Historical Provider, MD    Family History Family History  Problem Relation Age of Onset  . Heart attack Mother 58    with CABG  . Heart disease Mother   . Heart attack Father   . Heart disease Father   . Clotting disorder Sister     lung cancer    Social History Social History  Substance Use Topics  . Smoking status: Current Every Day Smoker    Packs/day: 0.25    Years: 20.00  . Smokeless tobacco: Never Used  . Alcohol use No     Allergies   Lodine [etodolac]   Review of Systems Review of Systems  Constitutional: Negative for fever.  HENT: Positive for congestion and sore throat.   Eyes: Positive for photophobia and pain.  Musculoskeletal: Positive for neck pain.  Neurological: Positive for headaches.  All other systems reviewed and are negative.    Physical Exam Updated Vital Signs BP 143/72 (BP Location: Right Arm)   Pulse 60   Temp 98.4 F (36.9 C) (Oral)   Resp 18   LMP 10/16/2011   SpO2 100%   Physical Exam  Constitutional: She is oriented to person, place, and time. She appears well-developed and well-nourished.  Pt laying in bed with lights off   HENT:    Head: Normocephalic and atraumatic.  Eyes: EOM are normal.  Photophobia   Neck: Neck rigidity present.  Nuchal rigidity   Cardiovascular: Normal rate, regular rhythm and normal heart sounds.   Pulmonary/Chest: Effort normal and breath sounds normal.  Abdominal: Soft. She exhibits no distension. There is no tenderness.  Musculoskeletal: Normal range of motion.  Neurological: She is alert and oriented to person, place, and time.  Skin: Skin is warm and dry.  Psychiatric: She has a normal mood and affect. Judgment normal.  Nursing note and vitals reviewed.    ED Treatments / Results  DIAGNOSTIC STUDIES:  Oxygen Saturation is 100% on RA, normal by my interpretation.  COORDINATION OF CARE:  1:20 PM Discussed treatment plan with pt at bedside and pt agreed to plan.  Labs (all labs ordered are listed, but only abnormal results are displayed) Labs Reviewed  CSF CULTURE  CSF CULTURE  CBC WITH DIFFERENTIAL/PLATELET  BASIC METABOLIC PANEL  GLUCOSE, CSF  PROTEIN, CSF  VDRL, CSF  CSF CELL COUNT WITH DIFFERENTIAL  CSF CELL COUNT WITH DIFFERENTIAL    EKG  EKG Interpretation None       Radiology No results found.  Procedures Procedures (including critical care time)  Medications Ordered in ED Medications - No data to display   Initial Impression / Assessment and Plan / ED Course  I have reviewed the triage vital signs and the nursing notes.  Pertinent labs & imaging results that were available during my care of the patient were reviewed by me and considered in my medical decision making (see chart for details).  Clinical Course     55yF with headache, but she does have meningismus on exam. Afebrile, nontoxic and nonfocal neuro exam. May be viral meningitis. Doubt bacterial. Imaging w/o acute abnormality. LP results pending. If they are reassuring then I feel she can be discharged with symptomatic tx.   Final Clinical Impressions(s) / ED Diagnoses   Final  diagnoses:  Headache  Nonintractable headache, unspecified chronicity pattern, unspecified headache type    New Prescriptions New Prescriptions   No medications on file   I personally preformed the services scribed in my presence. The recorded information has been reviewed is accurate. Virgel Manifold, MD.    Virgel Manifold, MD 10/17/16 [phone]

## 2016-10-08 NOTE — ED Notes (Signed)
Patient transported to CT 

## 2016-10-08 NOTE — Procedures (Signed)
LUMBAR PUNCTURE:  A time-out was performed. The patient was placed in the seated in a semi-fetal position leaning forward on a tray table. The area was cleansed with Betadine and draped in the usual sterile fashion. Anesthesia was achieved with 5 mL of 2% lidocaine. A 22-gauge 3.5-inch spinal needle was used to attempt to gain access of the L4-L5 interspace without success. After a period of time Dr. Wilson Singer took over to attempt the same, without success. No CSF fluid was obtained. The patient tolerated this procedure well and had no immediate complications.  EBL: Minimal  Elberta Leatherwood, MD,MS,  PGY3 10/08/2016 5:28 PM

## 2016-10-08 NOTE — ED Notes (Signed)
Dr Wilson Singer and resident attempted lumbar puncture x2, unsuccessful

## 2016-10-10 LAB — VDRL, CSF: VDRL Quant, CSF: NONREACTIVE

## 2016-10-12 LAB — CSF CULTURE

## 2016-10-12 LAB — CSF CULTURE W GRAM STAIN
Culture: NO GROWTH
Gram Stain: NONE SEEN

## 2017-01-01 ENCOUNTER — Other Ambulatory Visit: Payer: Self-pay | Admitting: Gastroenterology

## 2017-01-01 DIAGNOSIS — K7460 Unspecified cirrhosis of liver: Secondary | ICD-10-CM

## 2017-01-08 ENCOUNTER — Other Ambulatory Visit: Payer: Medicare Other

## 2017-01-16 ENCOUNTER — Ambulatory Visit
Admission: RE | Admit: 2017-01-16 | Discharge: 2017-01-16 | Disposition: A | Discharge status: Home / Self Care | Payer: Medicare Other | Source: Ambulatory Visit | Attending: Gastroenterology | Admitting: Gastroenterology

## 2017-01-16 DIAGNOSIS — K7460 Unspecified cirrhosis of liver: Secondary | ICD-10-CM

## 2017-01-28 ENCOUNTER — Other Ambulatory Visit: Payer: Self-pay | Admitting: Obstetrics and Gynecology

## 2017-01-28 DIAGNOSIS — N644 Mastodynia: Secondary | ICD-10-CM

## 2017-02-03 ENCOUNTER — Ambulatory Visit
Admission: RE | Admit: 2017-02-03 | Discharge: 2017-02-03 | Disposition: A | Discharge status: Home / Self Care | Payer: Medicare Other | Source: Ambulatory Visit | Attending: Obstetrics and Gynecology | Admitting: Obstetrics and Gynecology

## 2017-02-03 DIAGNOSIS — N644 Mastodynia: Secondary | ICD-10-CM

## 2017-02-21 ENCOUNTER — Other Ambulatory Visit: Payer: Self-pay | Admitting: Gastroenterology

## 2017-02-27 ENCOUNTER — Other Ambulatory Visit: Payer: Self-pay | Admitting: Gastroenterology

## 2017-02-27 DIAGNOSIS — R109 Unspecified abdominal pain: Secondary | ICD-10-CM

## 2017-03-12 ENCOUNTER — Ambulatory Visit
Admission: RE | Admit: 2017-03-12 | Discharge: 2017-03-12 | Disposition: A | Discharge status: Home / Self Care | Payer: Medicare Other | Source: Ambulatory Visit | Attending: Gastroenterology | Admitting: Gastroenterology

## 2017-03-12 DIAGNOSIS — R109 Unspecified abdominal pain: Secondary | ICD-10-CM

## 2017-03-12 MED ORDER — GADOBENATE DIMEGLUMINE 529 MG/ML IV SOLN
15.0000 mL | Freq: Once | INTRAVENOUS | Status: AC | PRN
  Administered 2017-03-12: 14 mL via INTRAVENOUS

## 2017-03-19 ENCOUNTER — Ambulatory Visit
Admission: RE | Admit: 2017-03-19 | Discharge: 2017-03-19 | Disposition: A | Discharge status: Home / Self Care | Payer: Medicare Other | Source: Ambulatory Visit | Attending: Gastroenterology | Admitting: Gastroenterology

## 2017-03-19 ENCOUNTER — Other Ambulatory Visit: Payer: Self-pay | Admitting: Gastroenterology

## 2017-03-19 DIAGNOSIS — K5900 Constipation, unspecified: Secondary | ICD-10-CM

## 2017-05-05 ENCOUNTER — Other Ambulatory Visit: Payer: Self-pay | Admitting: Gastroenterology

## 2017-05-21 ENCOUNTER — Encounter (HOSPITAL_COMMUNITY)
Admission: RE | Disposition: A | Discharge status: Home / Self Care | Payer: Self-pay | Source: Ambulatory Visit | Attending: Gastroenterology

## 2017-05-21 ENCOUNTER — Ambulatory Visit (HOSPITAL_COMMUNITY)
Admission: RE | Admit: 2017-05-21 | Discharge: 2017-05-21 | Disposition: A | Discharge status: Home / Self Care | Payer: Medicare Other | Source: Ambulatory Visit | Attending: Gastroenterology | Admitting: Gastroenterology

## 2017-05-21 DIAGNOSIS — K5900 Constipation, unspecified: Secondary | ICD-10-CM | POA: Insufficient documentation

## 2017-05-21 HISTORY — PX: ANAL RECTAL MANOMETRY: SHX6358

## 2017-05-21 SURGERY — MANOMETRY, ANORECTAL

## 2017-05-21 NOTE — Progress Notes (Signed)
Anal Rectal Manometry done per protocol.  Pt. Tolerated well, without complication.  Dr. Marcello Moores to be notified today of procedure.    Laverta Baltimore , RN

## 2017-05-23 ENCOUNTER — Encounter (HOSPITAL_COMMUNITY): Payer: Self-pay | Admitting: Gastroenterology

## 2017-06-01 ENCOUNTER — Encounter (HOSPITAL_COMMUNITY): Payer: Self-pay

## 2017-06-01 ENCOUNTER — Emergency Department (HOSPITAL_COMMUNITY)
Admission: EM | Admit: 2017-06-01 | Discharge: 2017-06-01 | Disposition: A | Discharge status: Home / Self Care | Payer: Medicare Other | Attending: Emergency Medicine | Admitting: Emergency Medicine

## 2017-06-01 ENCOUNTER — Emergency Department (HOSPITAL_COMMUNITY): Payer: Medicare Other

## 2017-06-01 DIAGNOSIS — R5383 Other fatigue: Secondary | ICD-10-CM | POA: Insufficient documentation

## 2017-06-01 DIAGNOSIS — R0602 Shortness of breath: Secondary | ICD-10-CM | POA: Diagnosis not present

## 2017-06-01 DIAGNOSIS — Z8673 Personal history of transient ischemic attack (TIA), and cerebral infarction without residual deficits: Secondary | ICD-10-CM | POA: Insufficient documentation

## 2017-06-01 DIAGNOSIS — R109 Unspecified abdominal pain: Secondary | ICD-10-CM | POA: Diagnosis not present

## 2017-06-01 DIAGNOSIS — Z7982 Long term (current) use of aspirin: Secondary | ICD-10-CM | POA: Insufficient documentation

## 2017-06-01 DIAGNOSIS — R350 Frequency of micturition: Secondary | ICD-10-CM | POA: Diagnosis not present

## 2017-06-01 DIAGNOSIS — Z79899 Other long term (current) drug therapy: Secondary | ICD-10-CM | POA: Diagnosis not present

## 2017-06-01 DIAGNOSIS — F17210 Nicotine dependence, cigarettes, uncomplicated: Secondary | ICD-10-CM | POA: Diagnosis not present

## 2017-06-01 DIAGNOSIS — R0789 Other chest pain: Secondary | ICD-10-CM | POA: Diagnosis not present

## 2017-06-01 LAB — CBC WITH DIFFERENTIAL/PLATELET
BASOS ABS: 0 10*3/uL (ref 0.0–0.1)
Basophils Relative: 1 %
Eosinophils Absolute: 0.1 10*3/uL (ref 0.0–0.7)
Eosinophils Relative: 2 %
HCT: 37.1 % (ref 36.0–46.0)
HEMOGLOBIN: 13 g/dL (ref 12.0–15.0)
LYMPHS ABS: 2.5 10*3/uL (ref 0.7–4.0)
LYMPHS PCT: 57 %
MCH: 29.6 pg (ref 26.0–34.0)
MCHC: 35 g/dL (ref 30.0–36.0)
MCV: 84.5 fL (ref 78.0–100.0)
Monocytes Absolute: 0.3 10*3/uL (ref 0.1–1.0)
Monocytes Relative: 7 %
NEUTROS ABS: 1.4 10*3/uL — AB (ref 1.7–7.7)
NEUTROS PCT: 33 %
PLATELETS: 196 10*3/uL (ref 150–400)
RBC: 4.39 MIL/uL (ref 3.87–5.11)
RDW: 12.7 % (ref 11.5–15.5)
WBC: 4.3 10*3/uL (ref 4.0–10.5)

## 2017-06-01 LAB — COMPREHENSIVE METABOLIC PANEL
ALK PHOS: 76 U/L (ref 38–126)
ALT: 21 U/L (ref 14–54)
AST: 23 U/L (ref 15–41)
Albumin: 4.1 g/dL (ref 3.5–5.0)
Anion gap: 8 (ref 5–15)
BUN: 9 mg/dL (ref 6–20)
CALCIUM: 9 mg/dL (ref 8.9–10.3)
CHLORIDE: 108 mmol/L (ref 101–111)
CO2: 23 mmol/L (ref 22–32)
CREATININE: 0.84 mg/dL (ref 0.44–1.00)
Glucose, Bld: 97 mg/dL (ref 65–99)
Potassium: 4.1 mmol/L (ref 3.5–5.1)
Sodium: 139 mmol/L (ref 135–145)
Total Bilirubin: 0.5 mg/dL (ref 0.3–1.2)
Total Protein: 7 g/dL (ref 6.5–8.1)

## 2017-06-01 LAB — URINALYSIS, ROUTINE W REFLEX MICROSCOPIC
BILIRUBIN URINE: NEGATIVE
Glucose, UA: NEGATIVE mg/dL
HGB URINE DIPSTICK: NEGATIVE
KETONES UR: NEGATIVE mg/dL
Leukocytes, UA: NEGATIVE
NITRITE: NEGATIVE
PH: 5 (ref 5.0–8.0)
Protein, ur: NEGATIVE mg/dL
SPECIFIC GRAVITY, URINE: 1.012 (ref 1.005–1.030)

## 2017-06-01 LAB — D-DIMER, QUANTITATIVE: D-Dimer, Quant: <0.27 ug/mL-FEU (ref 0.00–0.50)

## 2017-06-01 MED ORDER — CYCLOBENZAPRINE HCL 10 MG PO TABS: 10.0000 mg | ORAL_TABLET | Freq: Three times a day (TID) | ORAL | 0 refills | Status: AC | PRN

## 2017-06-01 MED ORDER — HYDROCODONE-ACETAMINOPHEN 5-325 MG PO TABS: 1.0000 | ORAL_TABLET | ORAL | 0 refills | Status: DC | PRN

## 2017-06-01 MED ORDER — SODIUM CHLORIDE 0.9 % IV BOLUS (SEPSIS)
1000.0000 mL | Freq: Once | INTRAVENOUS | Status: AC
  Administered 2017-06-01: 1000 mL via INTRAVENOUS

## 2017-06-01 MED ORDER — HYDROCODONE-ACETAMINOPHEN 5-325 MG PO TABS: 2.0000 | ORAL_TABLET | Freq: Once | ORAL | Status: DC

## 2017-06-01 MED ORDER — ONDANSETRON HCL 4 MG/2ML IJ SOLN
4.0000 mg | Freq: Once | INTRAMUSCULAR | Status: AC
  Administered 2017-06-01: 4 mg via INTRAVENOUS
  Filled 2017-06-01: qty 2

## 2017-06-01 MED ORDER — MORPHINE SULFATE (PF) 4 MG/ML IV SOLN
4.0000 mg | Freq: Once | INTRAVENOUS | Status: AC
  Administered 2017-06-01: 4 mg via INTRAVENOUS
  Filled 2017-06-01: qty 1

## 2017-06-01 MED ORDER — HYDROCODONE-ACETAMINOPHEN 5-325 MG PO TABS
2.0000 | ORAL_TABLET | Freq: Once | ORAL | Status: AC
  Administered 2017-06-01: 2 via ORAL
  Filled 2017-06-01: qty 2

## 2017-06-01 NOTE — ED Triage Notes (Signed)
Patient complains of shortness of breath after talking to her spouse this am. No CP and denies cold/cough. On arrival speaking full sentences and no distress

## 2017-06-02 NOTE — ED Provider Notes (Signed)
[facility] DEPT Provider Note   CSN: [id number] Arrival date & time: 06/01/17  1246     History   Chief Complaint Chief Complaint  Patient presents with  . Shortness of Breath    HPI Becky Wolfe is a 56 y.o. female.  HPI  56 yo F with h/o MS, anxiety, remote h/o provoked DVT, here with right flank pain. Pt reports that over the past day, she has had right flank/chest pain. Her sx started gradually while she was talking. She noticed gradual onset of aching, cramp like, right flank pain and back pain. Pain is worse with movement and palpation, also inspiration. She has had some mild urinary frequency but no hematuria. No LE edema, no recent immobilizations. No cough, SOB, hemoptysis. Her pain is better when sitting still. She has not tried any medications.  Past Medical History:  Diagnosis Date  . Anxiety 06-16-13   hx. panic attacks, none recent  . Blood transfusion without reported diagnosis    20 yrs ago after childbirth  . Bronchitis    hx of  . Chronic neck pain   . DVT (deep venous thrombosis) (HCC)    rt. leg   . Headache(784.0)    migraines-has decreased  . Heart murmur    Dr. Etter Sjogren  . Hepatitis C    dx. Hep. C(s/p transfusion age 26) low Hgb  .-multiple transfusions.  . Multiple sclerosis ([place])    dx. -106yrs ago, then med stopped-due to liver enzymes changes,occ. periods of weakness in arms"drops things"  . Pneumonia    hx of , ? mild emphysema on CT scan 5'14, multiple pulmonary nodules  . RSD (reflex sympathetic dystrophy) 06-16-13   legs  . TIA (transient ischemic attack)    hx of (weakness left side)-none in 1 yr    Patient Active Problem List   Diagnosis Date Noted  . Accidental overdose 03/07/2016  . Atherosclerosis of native coronary artery of native heart without angina pectoris 02/10/2015  . History of DVT (deep vein thrombosis) 02/10/2015  . Tobacco abuse 02/10/2015  . Right leg pain 04/23/2013  . DVT (deep vein thrombosis) in pregnancy  ([place]) 04/23/2013  . Chronic pain disorder 04/23/2013  . RSD lower limb 04/23/2013  . Chronic anticoagulation 04/23/2013    Past Surgical History:  Procedure Laterality Date  . ABDOMINAL HYSTERECTOMY    . ANAL RECTAL MANOMETRY N/A 04/11/2015   Procedure: ANO RECTAL MANOMETRY;  Surgeon: Arta Silence, MD;  Location: [facility];  Service: Endoscopy;  Laterality: N/A;  . ANAL RECTAL MANOMETRY N/A 05/21/2017   Procedure: ANO RECTAL MANOMETRY;  Surgeon: Arta Silence, MD;  Location: [facility];  Service: Endoscopy;  Laterality: N/A;  . ANKLE SURGERY Right    no retained hardware  . ANTERIOR CERVICAL DECOMP/DISCECTOMY FUSION  09/21/2012   Procedure: ANTERIOR CERVICAL DECOMPRESSION/DISCECTOMY FUSION 2 LEVELS;  Surgeon: Otilio Connors, MD;  Location: [facility];  Service: Neurosurgery;  Laterality: N/A;  Cervical five-six, cervical seven-thoracic one Anterior cervical decompression/diskectomy/fusion/Lifenet bone/trestle plate/Prior Cervical six--seven Anterior cervical fusion/Removing trestle plate  . APPENDECTOMY    . CESAREAN SECTION    . ESOPHAGOGASTRODUODENOSCOPY (EGD) WITH PROPOFOL N/A 06/30/2013   Procedure: ESOPHAGOGASTRODUODENOSCOPY (EGD) WITH PROPOFOL;  Surgeon: Arta Silence, MD;  Location: [facility];  Service: Endoscopy;  Laterality: N/A;  . NECK SURGERY    . WEDGE RESECTION     left ovary with cyst removed ,hx. multiple cysts removed prior    OB History    No data available  Home Medications    Prior to Admission medications   Medication Sig Start Date End Date Taking? Authorizing Provider  aspirin EC 81 MG tablet Take 81 mg by mouth daily.    Yes [provider]  clonazePAM (KLONOPIN) 1 MG tablet Take 1 mg by mouth 2 (two) times daily as needed for anxiety.    Yes [provider]  cloNIDine (CATAPRES) 0.1 MG tablet Take 0.1 mg by mouth every 8 (eight) hours as needed (anxiety).    Yes [provider]  gabapentin (NEURONTIN) 800 MG  tablet Take 800 mg by mouth 3 (three) times daily. 05/12/17  Yes [provider]  levorphanol (LEVODROMORAN) 2 MG tablet Take 2 mg by mouth every 6 (six) hours as needed for pain.   Yes [provider]  NUCYNTA 100 MG TABS Take 100 mg by mouth 2 (two) times daily. 05/15/17  Yes [provider]  sertraline (ZOLOFT) 50 MG tablet Take 50 mg by mouth daily. 04/30/17  Yes [provider]  butalbital-acetaminophen-caffeine (FIORICET, ESGIC) 50-325-40 MG tablet Take 1 tablet by mouth every 6 (six) hours as needed for headache. Patient not taking: Reported on 06/01/2017 10/08/16 10/08/17  Virgel Manifold, MD  cyclobenzaprine (FLEXERIL) 10 MG tablet Take 1 tablet (10 mg total) by mouth 3 (three) times daily as needed for muscle spasms. 06/01/17 06/11/17  Duffy Bruce, MD  furosemide (LASIX) 20 MG tablet Take 20 mg by mouth daily as needed for fluid.     [provider]  HYDROcodone-acetaminophen (NORCO/VICODIN) 5-325 MG tablet Take 1-2 tablets by mouth every 4 (four) hours as needed for severe pain. 06/01/17   Duffy Bruce, MD    Family History Family History  Problem Relation Age of Onset  . Heart attack Mother 57       with CABG  . Heart disease Mother   . Heart attack Father   . Heart disease Father   . Clotting disorder Sister        lung cancer    Social History Social History  Substance Use Topics  . Smoking status: Current Every Day Smoker    Packs/day: 0.25    Years: 20.00  . Smokeless tobacco: Never Used  . Alcohol use No     Allergies   Lodine [etodolac]   Review of Systems Review of Systems  Constitutional: Positive for fatigue.  Respiratory: Positive for shortness of breath.   Genitourinary: Positive for flank pain.  All other systems reviewed and are negative.    Physical Exam Updated Vital Signs BP 117/77   Pulse (!) 49   Temp 98.2 F (36.8 C) (Oral)   Resp 13   LMP 10/16/2011   SpO2 98%   Physical Exam    Constitutional: She is oriented to person, place, and time. She appears well-developed and well-nourished. No distress.  HENT:  Head: Normocephalic and atraumatic.  Eyes: Conjunctivae are normal.  Neck: Neck supple.  Cardiovascular: Normal rate, regular rhythm and normal heart sounds.  Exam reveals no friction rub.   No murmur heard. Pulmonary/Chest: Effort normal and breath sounds normal. No respiratory distress. She has no wheezes. She has no rales.  Abdominal: Soft. She exhibits no distension.  Musculoskeletal: She exhibits no edema.       Back:  Neurological: She is alert and oriented to person, place, and time. She exhibits normal muscle tone.  Skin: Skin is warm. Capillary refill takes less than 2 seconds.  Psychiatric: She has a normal mood and affect.  Nursing note and vitals reviewed.    ED Treatments / Results  Labs (all labs ordered are listed, but only abnormal results are displayed) Labs Reviewed  CBC WITH DIFFERENTIAL/PLATELET - Abnormal; Notable for the following:       Result Value   Neutro Abs 1.4 (*)    All other components within normal limits  COMPREHENSIVE METABOLIC PANEL  URINALYSIS, ROUTINE W REFLEX MICROSCOPIC  D-DIMER, QUANTITATIVE (NOT AT [facility])    EKG  EKG Interpretation  Date/Time:  Sunday June 01 2017 12:50:03 EDT Ventricular Rate:  54 PR Interval:  138 QRS Duration: 74 QT Interval:  416 QTC Calculation: 394 R Axis:   55 Text Interpretation:  Sinus bradycardia Otherwise normal ECG No significant change since last tracing Confirmed by Melbert Botelho ([id number]) on 06/02/2017 2:22:13 PM       Radiology Dg Chest 2 View  Result Date: 06/01/2017 CLINICAL DATA:  Shortness of breath and right flank pain since this morning. EXAM: CHEST  2 VIEW COMPARISON:  12/20/2015 FINDINGS: Lungs clear. Cardiomediastinal silhouette is within normal. There is minimal degenerate change of the spine. Fusion hardware unchanged over the cervicothoracic junction.  IMPRESSION: No active cardiopulmonary disease. Electronically Signed   By: Daniel  Boyle M.D.   On: 06/01/2017 13:22    Procedures Procedures (including critical care time)  Medications Ordered in ED Medications  morphine 4 MG/ML injection 4 mg (4 mg Intravenous Given 06/01/17 1408)  ondansetron (ZOFRAN) injection 4 mg (4 mg Intravenous Given 06/01/17 1408)  sodium chloride 0.9 % bolus 1,000 mL (0 mLs Intravenous Stopped 06/01/17 1821)  HYDROcodone-acetaminophen (NORCO/VICODIN) 5-325 MG per tablet 2 tablet (2 tablets Oral Given 06/01/17 1628)     Initial Impression / Assessment and Plan / ED Course  I have reviewed the triage vital signs and the nursing notes.  Pertinent labs & imaging results that were available during my care of the patient were reviewed by me and considered in my medical decision making (see chart for details).     56  yo F with PMHx as above here with left flank pain. Pain, exam is most c/w likely MSK chest wall/intercostal pain. EKG is non-ischemic, pain is very reproducible and right-sided, no SOB, no diaphoresis - do not suspect ACS. No LE swelling, no recent immobilization, D-Dimer undetectable - doubt PE or dissection. UA without signs of UTI or stone. Pain improved with analgesia in ED. Will advise OTC meds, short course of analgesics/muscle relaxants. Discussed strict return precautions.  Final Clinical Impressions(s) / ED Diagnoses   Final diagnoses:  Flank pain    New Prescriptions Discharge Medication List as of 06/01/2017  5:43 PM    START taking these medications   Details  cyclobenzaprine (FLEXERIL) 10 MG tablet Take 1 tablet (10 mg total) by mouth 3 (three) times daily as needed for muscle spasms., Starting Sun 06/01/2017, Until Wed 06/11/2017, Print    HYDROcodone-acetaminophen (NORCO/VICODIN) 5-325 MG tablet Take 1-2 tablets by mouth every 4 (four) hours as needed for severe pain., Starting Sun 06/01/2017, Print         Duffy Bruce,  MD 06/02/17 [phone]

## 2017-06-27 ENCOUNTER — Other Ambulatory Visit: Payer: Self-pay | Admitting: Gastroenterology

## 2017-06-27 DIAGNOSIS — M6289 Other specified disorders of muscle: Secondary | ICD-10-CM

## 2017-06-27 DIAGNOSIS — K5900 Constipation, unspecified: Secondary | ICD-10-CM

## 2017-07-10 ENCOUNTER — Ambulatory Visit
Admission: RE | Admit: 2017-07-10 | Discharge: 2017-07-10 | Disposition: A | Discharge status: Home / Self Care | Payer: Medicare Other | Source: Ambulatory Visit | Attending: Gastroenterology | Admitting: Gastroenterology

## 2017-07-10 DIAGNOSIS — M6289 Other specified disorders of muscle: Secondary | ICD-10-CM

## 2017-07-10 DIAGNOSIS — K5900 Constipation, unspecified: Secondary | ICD-10-CM

## 2017-10-02 IMAGING — MR MR PELVIS W/O CM
7 of 9 series · 33 of 48 positions shown · non-contrast
Comparison: None.

CLINICAL DATA: 56-year-old female with history of pelvic floor
dysfunction. Difficulty urinating. Unable to have complete bowel
movements. Sensation of bowel movements emanating from her vagina.

EXAM:
MRI PELVIS WITHOUT CONTRAST
TECHNIQUE: Multiplanar multisequence MR imaging of the pelvis was performed. No
intravenous contrast was administered.

[Series 2: t2_tse axial rest · axial · 3.0mm · 0.98mm/px · z∈[-98,+30]mm · 5 of 35 slices shown]
[im 1/35]
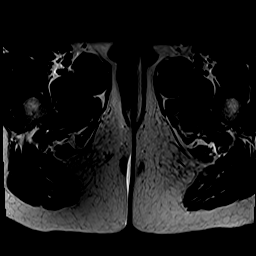
[im 9/35]
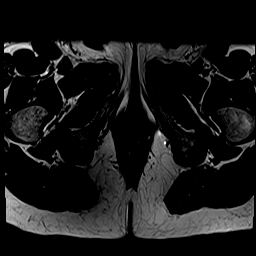
[im 18/35]
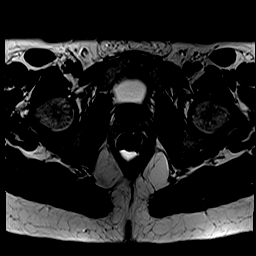
[im 26/35]
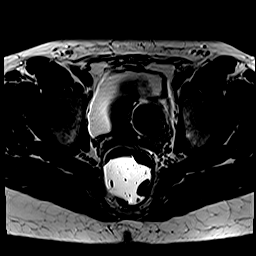
[im 35/35]
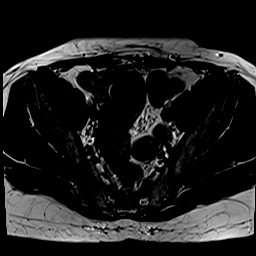

[Series 3: t2_tse_cor rest · coronal · 3.0mm · 1.37mm/px · 5 of 36 slices shown]
[im 1/36]
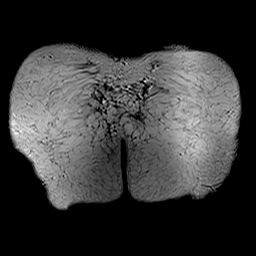
[im 9/36]
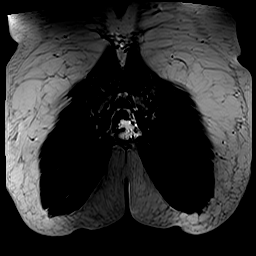
[im 18/36]
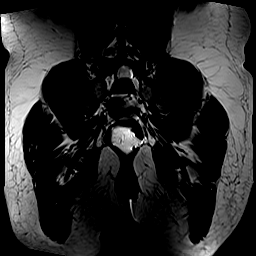
[im 27/36]
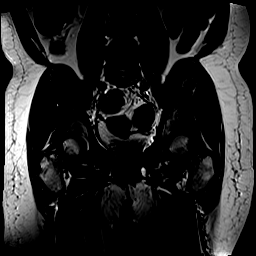
[im 36/36]
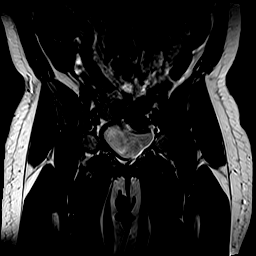

[Series 4: T2 · sagittal · 4.0mm · 0.98mm/px · 2 of 17 slices shown (1 of 5)]
[im 1/17]
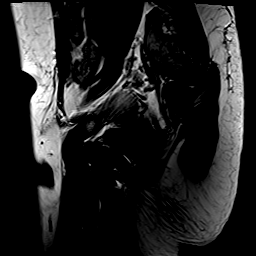
[im 17/17]
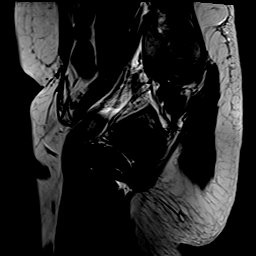

[Series 5: T2 · sagittal · 10.0mm · 0.66mm/px · 6 of 60 slices shown (2 of 5)]
[im 1/60]
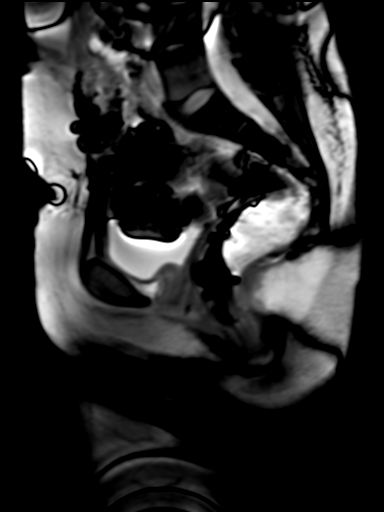
[im 12/60]
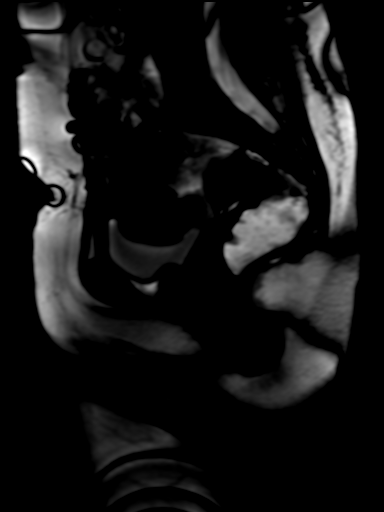
[im 24/60]
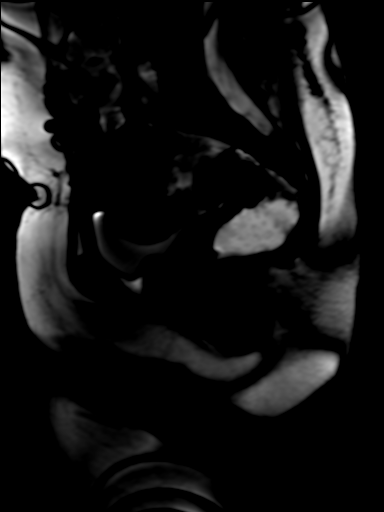
[im 36/60]
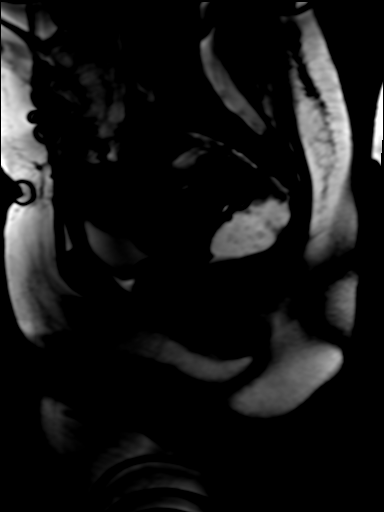
[im 48/60]
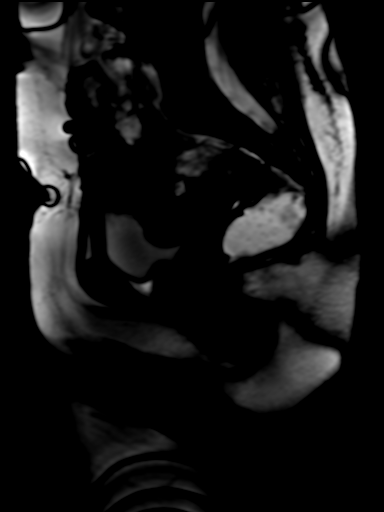
[im 60/60]
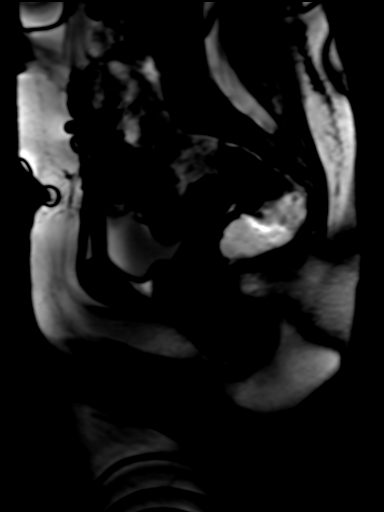

[Series 6: T2 · sagittal · 10.0mm · 0.66mm/px · 6 of 60 slices shown (3 of 5)]
[im 1/60]
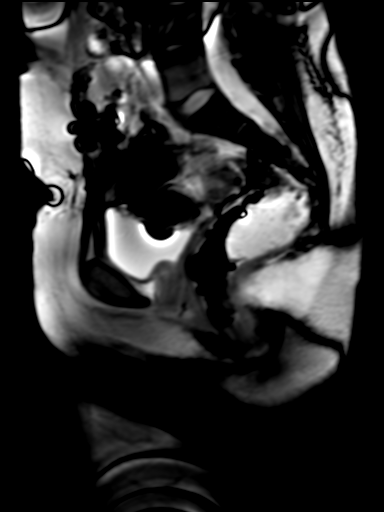
[im 12/60]
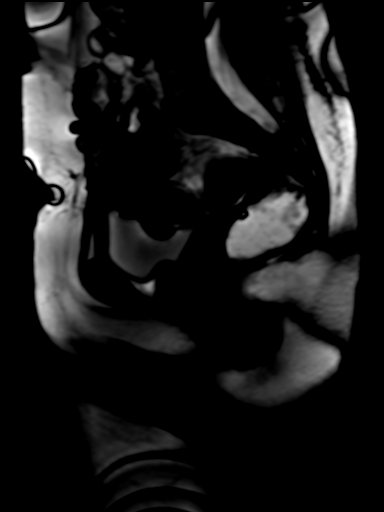
[im 24/60]
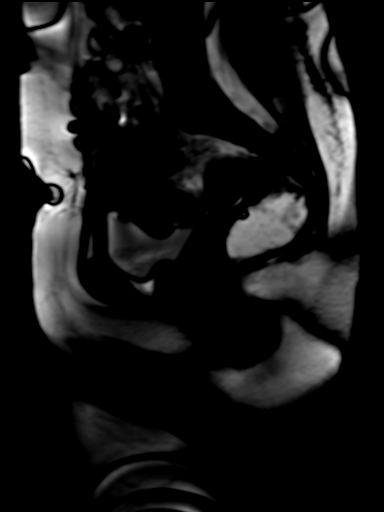
[im 36/60]
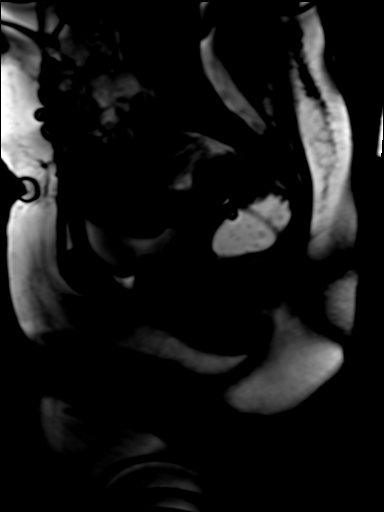
[im 48/60]
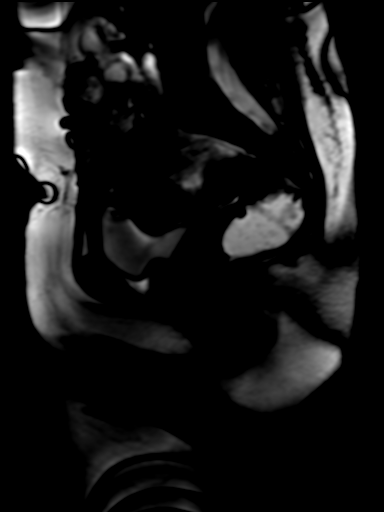
[im 60/60]
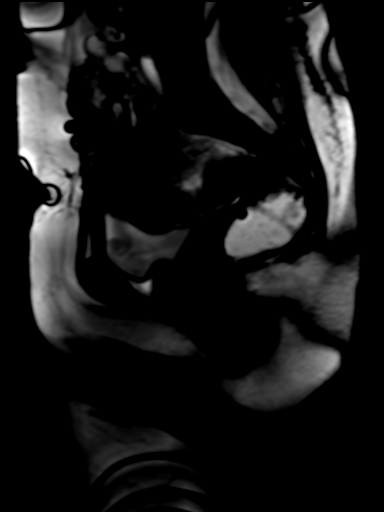

[Series 7: T2 · sagittal · 10.0mm · 0.66mm/px · 6 of 60 slices shown (4 of 5)]
[im 1/60]
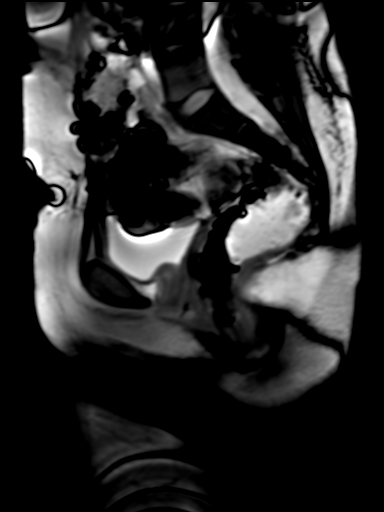
[im 12/60]
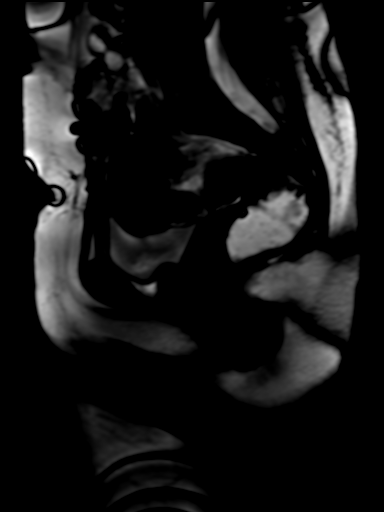
[im 24/60]
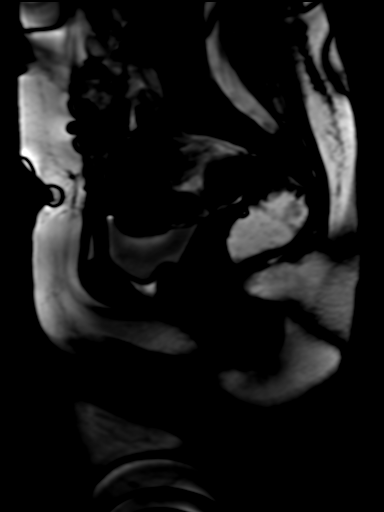
[im 36/60]
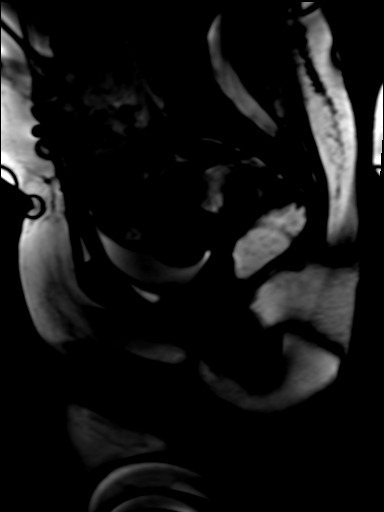
[im 48/60]
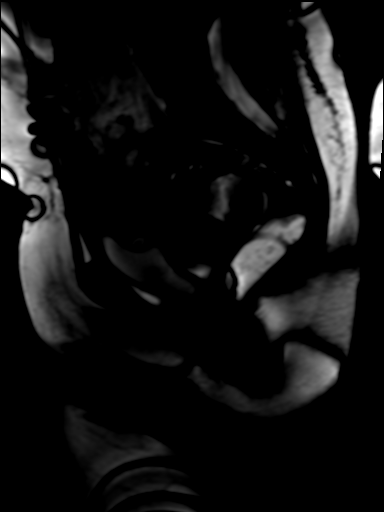
[im 60/60]
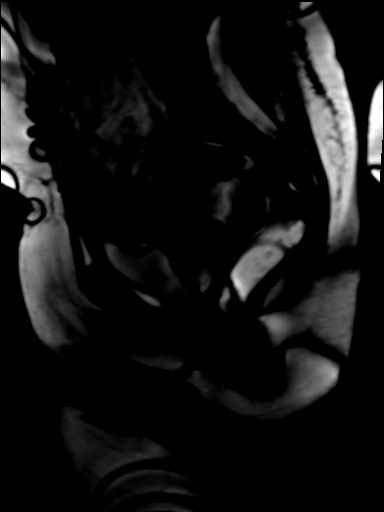

[Series 8: T2 · sagittal · 10.0mm · 0.66mm/px · 3 of 60 slices shown (5 of 5)]
[im 1/60]
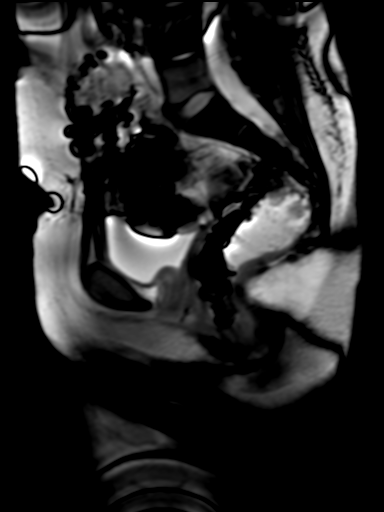
[im 12/60]
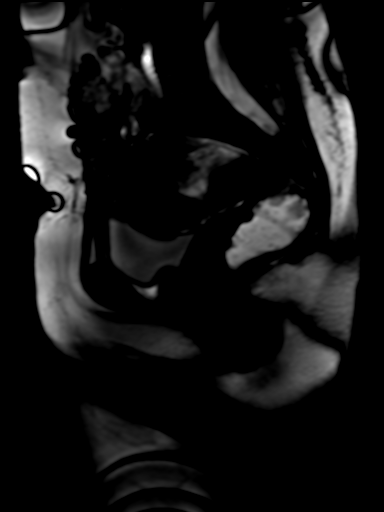
[im 24/60]
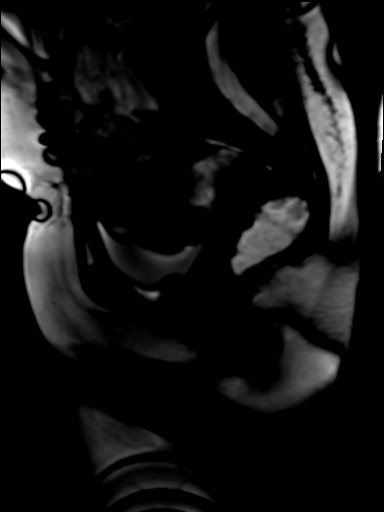

[33 of 48 positions shown; findings below may reference images not displayed]

FINDINGS: Rest images -

H-line:  7.8 cm

M-line:  2.5 cm

Urinary bladder base:  2.0 cm above the pubococcygeal line.

Vaginal apex: 3.5 cm above the pubococcygeal line.

Levator plate angle:  22  degrees

Stress Images -

H-line:  7.5 cm

M-line:  4.0 cm

Urinary bladder base:  1.5 cm below the pubococcygeal line.

Levator plate angle:  41 degrees

Anterior wall of rectum: 3.1 cm anterior to the anal canal.

Vaginal apex:  5 mm below the pubococcygeal line.

Additional Findings - Please note that the patient was unable to
completely void during definite outer phi, suggesting that the
maximal strain was not achieved during today's examination,
indicative that the true degree of pelvic floor laxity has been
underestimated by today's examination. No significant incidental
findings in the visualized portions of the pelvis.
IMPRESSION: 1. Findings are indicative of tricompartmental pelvic floor laxity
with cystocele, vaginal vault prolapse and rectocele, as detailed
above.

## 2017-12-16 ENCOUNTER — Other Ambulatory Visit: Payer: Self-pay | Admitting: Acute Care

## 2017-12-16 DIAGNOSIS — Z122 Encounter for screening for malignant neoplasm of respiratory organs: Secondary | ICD-10-CM

## 2017-12-16 DIAGNOSIS — F17210 Nicotine dependence, cigarettes, uncomplicated: Secondary | ICD-10-CM

## 2017-12-25 ENCOUNTER — Ambulatory Visit: Payer: Medicare Other | Admitting: Neurology

## 2018-01-01 ENCOUNTER — Encounter: Payer: Self-pay | Admitting: Neurology

## 2018-01-14 ENCOUNTER — Ambulatory Visit (INDEPENDENT_AMBULATORY_CARE_PROVIDER_SITE_OTHER): Payer: Medicare Other | Admitting: Acute Care

## 2018-01-14 ENCOUNTER — Encounter: Payer: Self-pay | Admitting: Acute Care

## 2018-01-14 ENCOUNTER — Ambulatory Visit (INDEPENDENT_AMBULATORY_CARE_PROVIDER_SITE_OTHER)
Admission: RE | Admit: 2018-01-14 | Discharge: 2018-01-14 | Disposition: A | Discharge status: Home / Self Care | Payer: Medicare Other | Source: Ambulatory Visit | Attending: Acute Care | Admitting: Acute Care

## 2018-01-14 DIAGNOSIS — F17210 Nicotine dependence, cigarettes, uncomplicated: Secondary | ICD-10-CM

## 2018-01-14 DIAGNOSIS — Z122 Encounter for screening for malignant neoplasm of respiratory organs: Secondary | ICD-10-CM

## 2018-01-14 NOTE — Progress Notes (Signed)
Shared Decision Making Visit Lung Cancer Screening Program [phone])   Eligibility:  Age 57 y.o.  Pack Years Smoking History Calculation 30 pack year smoking history (# packs/per year x # years smoked)  Recent History of coughing up blood  no  Unexplained weight loss? no ( >Than 15 pounds within the last 6 months )  Prior History Lung / other cancer no (Diagnosis within the last 5 years already requiring surveillance chest CT Scans).  Smoking Status Current Smoker  Former Smokers: Years since quit: NA  Quit Date: NA  Visit Components:  Discussion included one or more decision making aids. yes  Discussion included risk/benefits of screening. yes  Discussion included potential follow up diagnostic testing for abnormal scans. yes  Discussion included meaning and risk of over diagnosis. yes  Discussion included meaning and risk of False Positives. yes  Discussion included meaning of total radiation exposure. yes  Counseling Included:  Importance of adherence to annual lung cancer LDCT screening. yes  Impact of comorbidities on ability to participate in the program. yes  Ability and willingness to under diagnostic treatment. yes  Smoking Cessation Counseling:  Current Smokers:   Discussed importance of smoking cessation. yes  Information about tobacco cessation classes and interventions provided to patient. yes  Patient provided with "ticket" for LDCT Scan. yes  Symptomatic Patient. no  Counseling  Diagnosis Code: Tobacco Use Z72.0  Asymptomatic Patient yes  Counseling (Intermediate counseling: > three minutes counseling) V4098  Former Smokers:   Discussed the importance of maintaining cigarette abstinence. yes  Diagnosis Code: Personal History of Nicotine Dependence. J19.147  Information about tobacco cessation classes and interventions provided to patient. Yes  Patient provided with "ticket" for LDCT Scan. yes  Written Order for Lung Cancer  Screening with LDCT placed in Epic. Yes (CT Chest Lung Cancer Screening Low Dose W/O CM) [id number] Z12.2-Screening of respiratory organs Z87.891-Personal history of nicotine dependence    I have spent 25 minutes of face to face time with Becky Wolfe discussing the risks and benefits of lung cancer screening. We viewed a power point together that explained in detail the above noted topics. We paused at intervals to allow for questions to be asked and answered to ensure understanding.We discussed that the single most powerful action that she can take to decrease her risk of developing lung cancer is to quit smoking. We discussed whether or not she is ready to commit to setting a quit date. She is not ready to set a quit date. We discussed options for tools to aid in quitting smoking including nicotine replacement therapy, non-nicotine medications, support groups, Quit Smart classes, and behavior modification. We discussed that often times setting smaller, more achievable goals, such as eliminating 1 cigarette a day for a week and then 2 cigarettes a day for a week can be helpful in slowly decreasing the number of cigarettes smoked. This allows for a sense of accomplishment as well as providing a clinical benefit. I gave her the " Be Stronger Than Your Excuses" card with contact information for community resources, classes, free nicotine replacement therapy, and access to mobile apps, text messaging, and on-line smoking cessation help. I have also given her my card and contact information in the event she needs to contact me. We discussed the time and location of the scan, and that either Doroteo Glassman RN or I will call with the results within 24-48 hours of receiving them. I have offered her  a copy of the power point we viewed  as a resource in the event they need reinforcement of the concepts we discussed today in the office. The patient verbalized understanding of all of  the above and had no further questions  upon leaving the office. They have my contact information in the event they have any further questions.  I spent 4 minutes counseling on smoking cessation and the health risks of continued tobacco abuse.  I explained to the patient that there has been a high incidence of coronary artery disease noted on these exams. I explained that this is a non-gated exam therefore degree or severity cannot be determined. This patient is not on statin therapy. I have asked the patient to follow-up with their PCP regarding any incidental finding of coronary artery disease and management with diet or medication as their PCP  feels is clinically indicated. The patient verbalized understanding of the above and had no further questions upon completion of the visit.     Becky Spatz, NP 01/14/2018 11:49 AM

## 2018-01-19 ENCOUNTER — Other Ambulatory Visit: Payer: Self-pay | Admitting: Acute Care

## 2018-01-19 DIAGNOSIS — Z122 Encounter for screening for malignant neoplasm of respiratory organs: Secondary | ICD-10-CM

## 2018-01-19 DIAGNOSIS — F17210 Nicotine dependence, cigarettes, uncomplicated: Secondary | ICD-10-CM

## 2018-01-23 ENCOUNTER — Other Ambulatory Visit: Payer: Self-pay | Admitting: Nurse Practitioner

## 2018-01-23 DIAGNOSIS — K7469 Other cirrhosis of liver: Secondary | ICD-10-CM

## 2018-02-19 ENCOUNTER — Encounter: Payer: Self-pay | Admitting: Neurology

## 2018-02-19 ENCOUNTER — Ambulatory Visit: Payer: Medicare Other | Admitting: Neurology

## 2018-02-19 ENCOUNTER — Other Ambulatory Visit: Payer: Self-pay

## 2018-02-19 VITALS — HR 65 | Resp 18 | Ht 61.0 in | Wt 149.0 lb

## 2018-02-19 DIAGNOSIS — M542 Cervicalgia: Secondary | ICD-10-CM | POA: Diagnosis not present

## 2018-02-19 DIAGNOSIS — Z981 Arthrodesis status: Secondary | ICD-10-CM | POA: Diagnosis not present

## 2018-02-19 DIAGNOSIS — G894 Chronic pain syndrome: Secondary | ICD-10-CM | POA: Diagnosis not present

## 2018-02-19 DIAGNOSIS — G939 Disorder of brain, unspecified: Secondary | ICD-10-CM

## 2018-02-19 DIAGNOSIS — R3911 Hesitancy of micturition: Secondary | ICD-10-CM

## 2018-02-19 DIAGNOSIS — G90529 Complex regional pain syndrome I of unspecified lower limb: Secondary | ICD-10-CM | POA: Diagnosis not present

## 2018-02-19 NOTE — Progress Notes (Signed)
[facility]  PATIENT: Becky Wolfe DOB:  [birth year]  REFERRING DOCTOR OR PCP:  Serita Grammes SOURCE: Patient, notes from primary care, imaging and lab reports, multiple MRI and CT images on PACS personally reviewed  _________________________________   HISTORICAL  CHIEF COMPLAINT:  Chief Complaint  Patient presents with  . Neck Pain    Sts. she is here for sarcoidosis vs MS.  Sts. last MRI done at [facility] 3 yrs. ago. Also would like to discuss neck pain (hx of 2 surgeries, CRPS, and sees [facility] for pain mx)/fim    HISTORY OF PRESENT ILLNESS:  I had the pleasure of seeing your patient, Becky Wolfe, at [facility] for neurologic consultation regarding her neck pain and neurologic issues.  I had seen her in the past, greater than 3 years ago, for her abnormal brain MRI.    She was diagnosed with MS around 2010 based on abnormal MRI and started on an interferon.   I saw her a couple years later and felt the MRI changes did not look like MS and stopped her medication.   Changes most likely chronic microvascular ischemic changes.  She saw Dr. Moshe Cipro at [facility] for a second opinion and he concurred that MS ws unlikely.  She has an elevated ACE level and sarcoidosis has been considered.   Changes in the brain are not c/w neurosarcoidosis and she may not have sarcoid as no mediastinum lymphadenopathy.  She smoked less than a pack a day for a while but none recently.   She does not have DM.     She does not have HTN in general but did have BP spikes around 2010 or 2011 when hospitalized and receiving ketamine.   She had had a lot of neck pain.   In October 2016, an MRI showed ACDF at C5-C7 and she had s disc herniation at [facility] causing spinal stenosis.      A new muscle relaxant was added and the neck pain is doing a little better.   She notes hr right arm seem stight at times and she also drops items a lot.      She sees Dr. Mechele Dawley at the  [facility] for RSD but did not have much benefit from nerve blocks. Pain is in the right leg and followed a leg injury in 2009 (ankle and knee sprain).     She has had difficulty with urinary hesitancy.    She first noted this about the time of her c-spine surgery.   She has seen urology.   She has never been on Flomax.    She is on Nucynta 100 mg po bid.  She is also on levorphanol.   She saw Dr. Zigmund [facility]) who started tizanidine.this week.     I personally reviewed the CT scan of the cervical spine dated 14 2015 and the MRI of the cervical spine dated 09/02/2012.   She has fusion at C5-C6 and C6-C7.  At C4-C5 there is a central disc protrusion causing mild to moderate spinal stenosis and mild bilateral foraminal narrowing.  At C5-C6 there is moderate spinal stenosis and mild foraminal narrowing.  C6-C7 looks good with the fusion.  At C7-T1 there is a moderate left paramedian disc protrusion causing mild left foraminal narrowing and mild spinal stenosis.  I also looked at the MRI of the brain from 09/02/2012 and that shows extensive T2/FLAIR hyperintense foci in the hemispheres in a pattern that is most  consistent with chronic microvascular ischemic change.  REVIEW OF SYSTEMS: Constitutional: No fevers, chills, sweats, or change in appetite.   SOme fatigue Eyes: No visual changes, double vision, eye pain Ear, nose and throat: No hearing loss, ear pain, nasal congestion, sore throat Cardiovascular: No chest pain, palpitations Respiratory: No shortness of breath at rest or with exertion.   No wheezes GastrointestinaI: No nausea, vomiting, diarrhea, abdominal pain, fecal incontinence Genitourinary:She has hesitancy.   Musculoskeletal: as above  Integumentary: No rash, pruritus, skin lesions Neurological: as above Psychiatric: No depression at this time.  No anxiety Endocrine: No palpitations, diaphoresis, change in appetite, change in weigh or increased  thirst Hematologic/Lymphatic: No anemia, purpura, petechiae. Allergic/Immunologic: No itchy/runny eyes, nasal congestion, recent allergic reactions, rashes  ALLERGIES: Allergies  Allergen Reactions  . Lodine [Etodolac] Swelling and Other (See Comments)    Facial swelling Facial swelling Facial swelling Facial swelling    HOME MEDICATIONS:  Current Outpatient Medications:  .  aspirin EC 81 MG tablet, Take 81 mg by mouth daily. , Disp: , Rfl:  .  clonazePAM (KLONOPIN) 1 MG tablet, Take 1 mg by mouth 2 (two) times daily as needed for anxiety. , Disp: , Rfl:  .  cloNIDine (CATAPRES) 0.1 MG tablet, Take 0.1 mg by mouth every 8 (eight) hours as needed (anxiety). , Disp: , Rfl:  .  gabapentin (NEURONTIN) 800 MG tablet, Take 800 mg by mouth 3 (three) times daily., Disp: , Rfl: 1 .  levorphanol (LEVODROMORAN) 2 MG tablet, Take 2 mg by mouth every 6 (six) hours as needed for pain., Disp: , Rfl:  .  NUCYNTA 100 MG TABS, Take 100 mg by mouth 2 (two) times daily., Disp: , Rfl:  .  sertraline (ZOLOFT) 100 MG tablet, Take 100 mg by mouth daily., Disp: , Rfl:  .  tiZANidine (ZANAFLEX) 2 MG tablet, , Disp: , Rfl:  .  furosemide (LASIX) 20 MG tablet, Take 20 mg by mouth daily as needed for fluid. , Disp: , Rfl:  .  HYDROcodone-acetaminophen (NORCO/VICODIN) 5-325 MG tablet, Take 1-2 tablets by mouth every 4 (four) hours as needed for severe pain., Disp: 15 tablet, Rfl: 0 .  sertraline (ZOLOFT) 50 MG tablet, Take 50 mg by mouth daily., Disp: , Rfl:   PAST MEDICAL HISTORY: Past Medical History:  Diagnosis Date  . Anxiety 06-16-13   hx. panic attacks, none recent  . Blood transfusion without reported diagnosis    20 yrs ago after childbirth  . Bronchitis    hx of  . Chronic neck pain   . DVT (deep venous thrombosis) (HCC)    rt. leg   . Headache(784.0)    migraines-has decreased  . Heart murmur    Dr. Etter Sjogren  . Hepatitis C    dx. Hep. C(s/p transfusion age 31) low Hgb  .-multiple  transfusions.  . Multiple sclerosis ([place])    dx. -80yrs ago, then med stopped-due to liver enzymes changes,occ. periods of weakness in arms"drops things"  . Pneumonia    hx of , ? mild emphysema on CT scan 5'14, multiple pulmonary nodules  . RSD (reflex sympathetic dystrophy) 06-16-13   legs  . TIA (transient ischemic attack)    hx of (weakness left side)-none in 1 yr    PAST SURGICAL HISTORY: Past Surgical History:  Procedure Laterality Date  . ABDOMINAL HYSTERECTOMY    . ANAL RECTAL MANOMETRY N/A 04/11/2015   Procedure: ANO RECTAL MANOMETRY;  Surgeon: Arta Silence, MD;  Location: [facility];  Service:  Endoscopy;  Laterality: N/A;  . ANAL RECTAL MANOMETRY N/A 05/21/2017   Procedure: ANO RECTAL MANOMETRY;  Surgeon: Arta Silence, MD;  Location: [facility];  Service: Endoscopy;  Laterality: N/A;  . ANKLE SURGERY Right    no retained hardware  . ANTERIOR CERVICAL DECOMP/DISCECTOMY FUSION  09/21/2012   Procedure: ANTERIOR CERVICAL DECOMPRESSION/DISCECTOMY FUSION 2 LEVELS;  Surgeon: Otilio Connors, MD;  Location: [facility];  Service: Neurosurgery;  Laterality: N/A;  Cervical five-six, cervical seven-thoracic one Anterior cervical decompression/diskectomy/fusion/Lifenet bone/trestle plate/Prior Cervical six--seven Anterior cervical fusion/Removing trestle plate  . APPENDECTOMY    . CESAREAN SECTION    . ESOPHAGOGASTRODUODENOSCOPY (EGD) WITH PROPOFOL N/A 06/30/2013   Procedure: ESOPHAGOGASTRODUODENOSCOPY (EGD) WITH PROPOFOL;  Surgeon: Arta Silence, MD;  Location: [facility];  Service: Endoscopy;  Laterality: N/A;  . NECK SURGERY    . WEDGE RESECTION     left ovary with cyst removed ,hx. multiple cysts removed prior    FAMILY HISTORY: Family History  Problem Relation Age of Onset  . Heart attack Mother 93       with CABG  . Heart disease Mother   . Heart attack Father   . Heart disease Father   . Clotting disorder Sister        lung cancer  . Lupus Sister   . Diabetes  Brother   . Renal Disease Brother   . Diabetes Sister   . Diabetes Sister   . Healthy Sister   . Healthy Sister   . Healthy Sister     SOCIAL HISTORY:  Social History   Socioeconomic History  . Marital status: Married    Spouse name: Not on file  . Number of children: Not on file  . Years of education: Not on file  . Highest education level: Not on file  Occupational History  . Not on file  Social Needs  . Financial resource strain: Not on file  . Food insecurity:    Worry: Not on file    Inability: Not on file  . Transportation needs:    Medical: Not on file    Non-medical: Not on file  Tobacco Use  . Smoking status: Current Every Day Smoker    Packs/day: 1.00    Years: 36.00    Pack years: 36.00  . Smokeless tobacco: Never Used  Substance and Sexual Activity  . Alcohol use: No  . Drug use: No  . Sexual activity: Yes    Birth control/protection: Surgical  Lifestyle  . Physical activity:    Days per week: Not on file    Minutes per session: Not on file  . Stress: Not on file  Relationships  . Social connections:    Talks on phone: Not on file    Gets together: Not on file    Attends religious service: Not on file    Active member of club or organization: Not on file    Attends meetings of clubs or organizations: Not on file    Relationship status: Not on file  . Intimate partner violence:    Fear of current or ex partner: Not on file    Emotionally abused: Not on file    Physically abused: Not on file    Forced sexual activity: Not on file  Other Topics Concern  . Not on file  Social History Narrative  . Not on file     PHYSICAL EXAM  Vitals:   02/19/18 0912  Pulse: 65  Resp: 18  Weight: 149 lb (  67.6 kg)  Height: 5\' 1"  (1.549 m)    Body mass index is 28.15 kg/m.   General: The patient is well-developed and well-nourished and in no acute distress  Eyes:  Funduscopic exam shows normal optic discs and retinal vessels.  Neck: The neck is  supple, no carotid bruits are noted.  The neck is nontender.  Cardiovascular: The heart has a regular rate and rhythm with a normal S1 and S2. There were no murmurs, gallops or rubs. Lungs are clear to auscultation.  Skin: Extremities are without significant edema.  Musculoskeletal:  Back is nontender  Neurologic Exam  Mental status: The patient is alert and oriented x 3 at the time of the examination. The patient has apparent normal recent and remote memory, with an apparently normal attention span and concentration ability.   Speech is normal.  Cranial nerves: Extraocular movements are full. Pupils are equal, round, and reactive to light and accomodation.  Visual fields are full.  Facial symmetry is present. There is good facial sensation to soft touch bilaterally.Facial strength is normal.  Trapezius and sternocleidomastoid strength is normal. No dysarthria is noted.  The tongue is midline, and the patient has symmetric elevation of the soft palate. No obvious hearing deficits are noted.  Motor:  Muscle bulk is normal.   Tone is normal. Strength is  5 / 5 in all 4 extremities.   Sensory: She reports reduced sensation to touch and vibration in the left arm and leg compared to the right.  Coordination: Cerebellar testing reveals good finger-nose-finger and heel-to-shin bilaterally.  Gait and station: Station is normal.   Gait is slow with slightly reduced stride but good turn.   Tandem gait is mildly wide. Romberg is negative.   Reflexes: Deep tendon reflexes are symmetric and normal bilaterally.   Plantar responses are flexor.    DIAGNOSTIC DATA (LABS, IMAGING, TESTING) - I reviewed patient records, labs, notes, testing and imaging myself where available.  Lab Results  Component Value Date   WBC 4.3 06/01/2017   HGB 13.0 06/01/2017   HCT 37.1 06/01/2017   MCV 84.5 06/01/2017   PLT 196 06/01/2017      Component Value Date/Time   NA 139 06/01/2017 1406   K 4.1 06/01/2017 1406    CL 108 06/01/2017 1406   CO2 23 06/01/2017 1406   GLUCOSE 97 06/01/2017 1406   BUN 9 06/01/2017 1406   CREATININE 0.84 06/01/2017 1406   CREATININE 0.71 08/08/2011 1600   CALCIUM 9.0 06/01/2017 1406   PROT 7.0 06/01/2017 1406   ALBUMIN 4.1 06/01/2017 1406   AST 23 06/01/2017 1406   ALT 21 06/01/2017 1406   ALKPHOS 76 06/01/2017 1406   BILITOT 0.5 06/01/2017 1406   GFRNONAA >60 06/01/2017 1406   GFRNONAA >60 08/08/2011 1600   GFRAA >60 06/01/2017 1406   GFRAA >60 08/08/2011 1600   Lab Results  Component Value Date   CHOL 129 09/02/2012   HDL 60 09/02/2012   LDLCALC 44 09/02/2012   TRIG 124 09/02/2012   CHOLHDL 2.2 09/02/2012   Lab Results  Component Value Date   HGBA1C 5.6 09/02/2012   No results found for: VITAMINB12 Lab Results  Component Value Date   TSH 0.620 08/08/2011       ASSESSMENT AND PLAN  Neck pain  History of fusion of cervical spine  Urinary hesitancy  Chronic pain disorder  Complex regional pain syndrome type 1 of lower extremity, unspecified laterality  Chronic non-specific white matter lesions on MRI  In summary, Becky Wolfe is a 57 year old woman with neck pain and urinary hesitancy.  She also has an abnormal brain MRI and has had abnormal ACE levels in the past.    Despite having the infusion from C5 through C7, she does have significant residual spinal disease based on my review of the 2013 2015 studies.  I am concerned that these changes may have worsened which could also affect her bladder and cause her more pain.  She feels her neck pain improved after starting tizanidine so she is advised to continue that at 2 mg 3 times daily and that dose could be increased in the future if needed.  She does [facility] management through [facility].  We need to recheck an MRI of the cervical spine to determine if degenerative changes at C4-C5 and C7-T1 that existed in the past have worsened.  If so, consider referral to neurosurgery.   If  bladder function does not improve, consider Flomax or Rapaflo.  In the past I had seen her for her abnormal MRI and possibility of MS.  She had been diagnosed with MS around 2009 but based on my interpretation of the MRI and a subsequent negative lumbar puncture, I felt that MS was unlikely and stopped her treatment.  She was also seen for second opinion at [facility] and it was also felt to not have MS.  Most likely the foci in the brain are due to chronic microvascular ischemic change.  They have appeared  to be stable over time.  These changes were evaluated in the past and no further recommendations at this point.  She no longer smokes.  The changes are not consistent with neurosarcoidosis though she has had positive ACE levels in the past.  She does not appear to have mediastinal lymphadenopathy making sarcoid less likely.  She will return to see me in 3 months or sooner if there are new or worsening neurologic symptoms or based on the results of the cervical spine MRI.  We will call her with those results couple days after the study.    Thank you for asking me to see Becky Wolfe.  Please let me know if I can be of further assistance with her or other patients in the future  Devontay Celaya A. Felecia Shelling, MD, [facility] 4/81/8563, 14:97 AM Certified in Neurology, Clinical Neurophysiology, Sleep Medicine, Pain Medicine and Neuroimaging  [facility] [address] [place], [place] [address] [phone]

## 2018-02-25 ENCOUNTER — Ambulatory Visit
Admission: RE | Admit: 2018-02-25 | Discharge: 2018-02-25 | Disposition: A | Discharge status: Home / Self Care | Payer: Medicare Other | Source: Ambulatory Visit | Attending: Nurse Practitioner | Admitting: Nurse Practitioner

## 2018-02-25 DIAGNOSIS — K7469 Other cirrhosis of liver: Secondary | ICD-10-CM

## 2018-03-02 ENCOUNTER — Encounter: Payer: Self-pay | Admitting: Neurology

## 2018-05-28 ENCOUNTER — Ambulatory Visit: Payer: Medicare Other | Admitting: Neurology

## 2018-06-03 ENCOUNTER — Encounter (HOSPITAL_COMMUNITY): Payer: Self-pay | Admitting: Emergency Medicine

## 2018-06-03 ENCOUNTER — Emergency Department (HOSPITAL_COMMUNITY)
Admission: EM | Admit: 2018-06-03 | Discharge: 2018-06-03 | Disposition: A | Discharge status: Home / Self Care | Payer: Medicare Other | Attending: Emergency Medicine | Admitting: Emergency Medicine

## 2018-06-03 ENCOUNTER — Emergency Department (HOSPITAL_COMMUNITY): Payer: Medicare Other

## 2018-06-03 ENCOUNTER — Encounter: Payer: Self-pay | Admitting: Neurology

## 2018-06-03 ENCOUNTER — Telehealth: Payer: Self-pay | Admitting: Neurology

## 2018-06-03 ENCOUNTER — Other Ambulatory Visit: Payer: Self-pay

## 2018-06-03 ENCOUNTER — Ambulatory Visit: Payer: Medicare Other | Admitting: Neurology

## 2018-06-03 VITALS — BP 114/60 | HR 70 | Resp 16 | Ht 61.0 in | Wt 155.5 lb

## 2018-06-03 DIAGNOSIS — Y929 Unspecified place or not applicable: Secondary | ICD-10-CM | POA: Diagnosis not present

## 2018-06-03 DIAGNOSIS — Y999 Unspecified external cause status: Secondary | ICD-10-CM | POA: Diagnosis not present

## 2018-06-03 DIAGNOSIS — W500XXA Accidental hit or strike by another person, initial encounter: Secondary | ICD-10-CM | POA: Diagnosis not present

## 2018-06-03 DIAGNOSIS — G894 Chronic pain syndrome: Secondary | ICD-10-CM | POA: Diagnosis not present

## 2018-06-03 DIAGNOSIS — M5412 Radiculopathy, cervical region: Secondary | ICD-10-CM

## 2018-06-03 DIAGNOSIS — Z79899 Other long term (current) drug therapy: Secondary | ICD-10-CM | POA: Diagnosis not present

## 2018-06-03 DIAGNOSIS — F17200 Nicotine dependence, unspecified, uncomplicated: Secondary | ICD-10-CM | POA: Diagnosis not present

## 2018-06-03 DIAGNOSIS — Z7982 Long term (current) use of aspirin: Secondary | ICD-10-CM | POA: Insufficient documentation

## 2018-06-03 DIAGNOSIS — Y939 Activity, unspecified: Secondary | ICD-10-CM | POA: Insufficient documentation

## 2018-06-03 DIAGNOSIS — Z981 Arthrodesis status: Secondary | ICD-10-CM

## 2018-06-03 DIAGNOSIS — R3911 Hesitancy of micturition: Secondary | ICD-10-CM

## 2018-06-03 DIAGNOSIS — G90529 Complex regional pain syndrome I of unspecified lower limb: Secondary | ICD-10-CM

## 2018-06-03 DIAGNOSIS — M542 Cervicalgia: Secondary | ICD-10-CM

## 2018-06-03 DIAGNOSIS — R51 Headache: Secondary | ICD-10-CM | POA: Insufficient documentation

## 2018-06-03 MED ORDER — LAMOTRIGINE 100 MG PO TABS: 100.0000 mg | ORAL_TABLET | Freq: Two times a day (BID) | ORAL | 5 refills | Status: DC

## 2018-06-03 MED ORDER — MORPHINE SULFATE (PF) 4 MG/ML IV SOLN
4.0000 mg | Freq: Once | INTRAVENOUS | Status: AC
  Administered 2018-06-03: 4 mg via INTRAVENOUS
  Filled 2018-06-03: qty 1

## 2018-06-03 MED ORDER — ONDANSETRON HCL 4 MG/2ML IJ SOLN
4.0000 mg | Freq: Once | INTRAMUSCULAR | Status: AC
  Administered 2018-06-03: 4 mg via INTRAVENOUS
  Filled 2018-06-03: qty 2

## 2018-06-03 NOTE — ED Notes (Signed)
Patient verbalizes understanding of medications and discharge instructions. No further questions at this time. VSS and patient ambulatory at discharge.   
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
## 2018-06-03 NOTE — Discharge Instructions (Signed)
You may take over the counter anti-inflammatories for your symptoms. You may also use warm and cool compresses to the areas muscle strain.   Please follow up with your primary doctor in 1 week and return to the emergency department if you have any new or worsening symptoms.

## 2018-06-03 NOTE — Telephone Encounter (Signed)
[employer] Medicare order sent to GI. No auth they will reach out to the pt to schedule.  °
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
## 2018-06-03 NOTE — Patient Instructions (Signed)
The pharmacy has the prescription for lamotrigine 100 mg tablets. For 5 days, just take one half pill a day. For the next 5 days, take one half pill twice a day. For the next 5 days, take one half pill 3 times a day Then start taking one pill twice a day from this point on.    In the future, we may increase the dose further.  If you get a rash, need to stop the medication and not take it again. 

## 2018-06-03 NOTE — Progress Notes (Signed)
[facility]  PATIENT: Becky Wolfe DOB:  1961-04-16  REFERRING DOCTOR OR PCP:  Serita Grammes SOURCE: Patient, notes from primary care, imaging and lab reports, multiple MRI and CT images on PACS personally reviewed  _________________________________   HISTORICAL  CHIEF COMPLAINT:  Chief Complaint  Patient presents with  . Neck Pain    Sts. neck pain is worse in the last 6 mos., and worse when she turns her head to the right. Continues to see [facility] for pain mx. Sts. she has a photograph (not with her) that appears to show a lump on the left side of her neck. She has seen her pcp for same and no abnormalities were noted, per pt/fim    HISTORY OF PRESENT ILLNESS:  Becky Wolfe is a 57 yo woman with neck pain and neurologic issues.  Update 06/03/2018: She feels her neck pain is doing about the same, worse when she looks to the right.   The neck is stiff.   Pain is in the neck and sometimes to the right > left shoulder but not further into the arms.   She often has tingling (like falling asleep) in her arms.   She reports that during her second cervical operation the surgeon couldn't find a bone fragment and that it was embedded in a nerve?   She is not certain of the date of her surgery but reports the surgery was after the 08/2015 MRI.    She is still seeing Dr. Mechele Dawley at [facility] but nerve blocks have not helped much.   She continues to have urinary hesitancy but feels it is not getting any worse.  She has passed out twice in the last month.   She denies any preceding dizziness.lightheadedness.    However, the last time, she felt warm before she passed out and then felt she heard a high pitched tone.      She had LOC 1-2 minutes according to her husband.   She quickly recovered and was able to interact appropriately.  Sh felt tired but not weak or numb afterwards.   When she got home she laid down and rested.    A recent Lumbar MRI  showed inflammation in the right L5S1 facet joint with some foraminal narrowing.     She has been diagnosed with RSD in the right leg.  Pain increases with anything rubbing against it.   Often the leg feels cold to her.   Nerve blocks by Dr. Mechele Dawley have not helped.     For pain she is on levorphanol 2 mg bid, Nucynta 100 mg bid and gabapentin 800 mg tid.     I had previously seen her for an abnormal MRI and concern about MS.  MRI was personally reviewed again today.  She has a stable pattern of white matter changes most consistent with chronic microvascular ischemic change.  Therefore, MS is unlikely.   From 02/19/2018 I had seen her in the past, greater than 3 years ago, for her abnormal brain MRI.    She was diagnosed with MS around 2010 based on abnormal MRI and started on an interferon.   I saw her a couple years later and felt the MRI changes did not look like MS and stopped her medication.   Changes most likely chronic microvascular ischemic changes.  She saw Dr. Moshe Cipro at [facility] for a second opinion and he concurred that MS ws unlikely.  She has an elevated ACE level and  sarcoidosis has been considered.   Changes in the brain are not c/w neurosarcoidosis and she may not have sarcoid as no mediastinum lymphadenopathy.  She smoked less than a pack a day for a while but none recently.   She does not have DM.     She does not have HTN in general but did have BP spikes around 2010 or 2011 w  hen hospitalized and receiving ketamine.   She had had a lot of neck pain.   In October 2016, an MRI showed ACDF at C5-C7 and she had s disc herniation at [facility] causing spinal stenosis.      A new muscle relaxant was added and the neck pain is doing a little better.   She notes hr right arm seem stight at times and she also drops items a lot.      She sees Dr. Mechele Dawley at the [facility] for RSD but did not have much benefit from nerve blocks. Pain is in the right leg and followed a leg injury in 2009  (ankle and knee sprain).     She has had difficulty with urinary hesitancy.    She first noted this about the time of her c-spine surgery.   She has seen urology.   She has never been on Flomax.    She is on Nucynta 100 mg po bid.  She is also on levorphanol.   She saw Dr. Zigmund Daniel [facility]) who started tizanidine.this week.     I personally reviewed the CT scan of the cervical spine dated 14 2015 and the MRI of the cervical spine dated 09/02/2012.   She has fusion at C5-C6 and C6-C7.  At C4-C5 there is a central disc protrusion causing mild to moderate spinal stenosis and mild bilateral foraminal narrowing.  At C5-C6 there is moderate spinal stenosis and mild foraminal narrowing.  C6-C7 looks good with the fusion.  At C7-T1 there is a moderate left paramedian disc protrusion causing mild left foraminal narrowing and mild spinal stenosis.  I also looked at the MRI of the brain from 09/02/2012 and that shows extensive T2/FLAIR hyperintense foci in the hemispheres in a pattern that is most consistent with chronic microvascular ischemic change.  REVIEW OF SYSTEMS: Constitutional: No fevers, chills, sweats, or change in appetite.   SOme fatigue Eyes: No visual changes, double vision, eye pain Ear, nose and throat: No hearing loss, ear pain, nasal congestion, sore throat Cardiovascular: No chest pain, palpitations Respiratory: No shortness of breath at rest or with exertion.   No wheezes GastrointestinaI: No nausea, vomiting, diarrhea, abdominal pain, fecal incontinence Genitourinary:She has hesitancy.   Musculoskeletal: as above  Integumentary: No rash, pruritus, skin lesions Neurological: as above Psychiatric: No depression at this time.  No anxiety Endocrine: No palpitations, diaphoresis, change in appetite, change in weigh or increased thirst Hematologic/Lymphatic: No anemia, purpura, petechiae. Allergic/Immunologic: No itchy/runny eyes, nasal congestion, recent allergic reactions,  rashes  ALLERGIES: Allergies  Allergen Reactions  . Lodine [Etodolac] Swelling and Other (See Comments)    Facial swelling Facial swelling Facial swelling Facial swelling    HOME MEDICATIONS:  Current Outpatient Medications:  .  aspirin EC 81 MG tablet, Take 81 mg by mouth daily. , Disp: , Rfl:  .  clonazePAM (KLONOPIN) 1 MG tablet, Take 1 mg by mouth 2 (two) times daily as needed for anxiety. , Disp: , Rfl:  .  cloNIDine (CATAPRES) 0.1 MG tablet, Take 0.1 mg by mouth every 8 (eight) hours as  needed (anxiety). , Disp: , Rfl:  .  furosemide (LASIX) 20 MG tablet, Take 20 mg by mouth daily as needed for fluid. , Disp: , Rfl:  .  gabapentin (NEURONTIN) 800 MG tablet, Take 800 mg by mouth 3 (three) times daily., Disp: , Rfl: 1 .  levorphanol (LEVODROMORAN) 2 MG tablet, Take 2 mg by mouth every 6 (six) hours as needed for pain., Disp: , Rfl:  .  NUCYNTA 100 MG TABS, Take 100 mg by mouth 2 (two) times daily., Disp: , Rfl:  .  sertraline (ZOLOFT) 100 MG tablet, Take 100 mg by mouth daily., Disp: , Rfl:  .  sertraline (ZOLOFT) 50 MG tablet, Take 50 mg by mouth daily., Disp: , Rfl:  .  tiZANidine (ZANAFLEX) 2 MG tablet, , Disp: , Rfl:  .  lamoTRIgine (LAMICTAL) 100 MG tablet, Take 1 tablet (100 mg total) by mouth 2 (two) times daily., Disp: 60 tablet, Rfl: 5  PAST MEDICAL HISTORY: Past Medical History:  Diagnosis Date  . Anxiety 06-16-13   hx. panic attacks, none recent  . Blood transfusion without reported diagnosis    20 yrs ago after childbirth  . Bronchitis    hx of  . Chronic neck pain   . DVT (deep venous thrombosis) (HCC)    rt. leg   . Headache(784.0)    migraines-has decreased  . Heart murmur    Dr. Etter Sjogren  . Hepatitis C    dx. Hep. C(s/p transfusion age 68) low Hgb  .-multiple transfusions.  . Multiple sclerosis ([place])    dx. -62yrs ago, then med stopped-due to liver enzymes changes,occ. periods of weakness in arms"drops things"  . Pneumonia    hx of , ? mild emphysema on  CT scan 5'14, multiple pulmonary nodules  . RSD (reflex sympathetic dystrophy) 06-16-13   legs  . TIA (transient ischemic attack)    hx of (weakness left side)-none in 1 yr    PAST SURGICAL HISTORY: Past Surgical History:  Procedure Laterality Date  . ABDOMINAL HYSTERECTOMY    . ANAL RECTAL MANOMETRY N/A 04/11/2015   Procedure: ANO RECTAL MANOMETRY;  Surgeon: Arta Silence, MD;  Location: [facility];  Service: Endoscopy;  Laterality: N/A;  . ANAL RECTAL MANOMETRY N/A 05/21/2017   Procedure: ANO RECTAL MANOMETRY;  Surgeon: Arta Silence, MD;  Location: [facility];  Service: Endoscopy;  Laterality: N/A;  . ANKLE SURGERY Right    no retained hardware  . ANTERIOR CERVICAL DECOMP/DISCECTOMY FUSION  09/21/2012   Procedure: ANTERIOR CERVICAL DECOMPRESSION/DISCECTOMY FUSION 2 LEVELS;  Surgeon: Otilio Connors, MD;  Location: [facility];  Service: Neurosurgery;  Laterality: N/A;  Cervical five-six, cervical seven-thoracic one Anterior cervical decompression/diskectomy/fusion/Lifenet bone/trestle plate/Prior Cervical six--seven Anterior cervical fusion/Removing trestle plate  . APPENDECTOMY    . CESAREAN SECTION    . ESOPHAGOGASTRODUODENOSCOPY (EGD) WITH PROPOFOL N/A 06/30/2013   Procedure: ESOPHAGOGASTRODUODENOSCOPY (EGD) WITH PROPOFOL;  Surgeon: Arta Silence, MD;  Location: [facility];  Service: Endoscopy;  Laterality: N/A;  . NECK SURGERY    . WEDGE RESECTION     left ovary with cyst removed ,hx. multiple cysts removed prior    FAMILY HISTORY: Family History  Problem Relation Age of Onset  . Heart attack Mother 33       with CABG  . Heart disease Mother   . Heart attack Father   . Heart disease Father   . Clotting disorder Sister        lung cancer  . Lupus Sister   .  Diabetes Brother   . Renal Disease Brother   . Diabetes Sister   . Diabetes Sister   . Healthy Sister   . Healthy Sister   . Healthy Sister     SOCIAL HISTORY:  Social History   Socioeconomic History   . Marital status: Married    Spouse name: Not on file  . Number of children: Not on file  . Years of education: Not on file  . Highest education level: Not on file  Occupational History  . Not on file  Social Needs  . Financial resource strain: Not on file  . Food insecurity:    Worry: Not on file    Inability: Not on file  . Transportation needs:    Medical: Not on file    Non-medical: Not on file  Tobacco Use  . Smoking status: Current Every Day Smoker    Packs/day: 1.00    Years: 36.00    Pack years: 36.00  . Smokeless tobacco: Never Used  Substance and Sexual Activity  . Alcohol use: No  . Drug use: No  . Sexual activity: Yes    Birth control/protection: Surgical  Lifestyle  . Physical activity:    Days per week: Not on file    Minutes per session: Not on file  . Stress: Not on file  Relationships  . Social connections:    Talks on phone: Not on file    Gets together: Not on file    Attends religious service: Not on file    Active member of club or organization: Not on file    Attends meetings of clubs or organizations: Not on file    Relationship status: Not on file  . Intimate partner violence:    Fear of current or ex partner: Not on file    Emotionally abused: Not on file    Physically abused: Not on file    Forced sexual activity: Not on file  Other Topics Concern  . Not on file  Social History Narrative  . Not on file     PHYSICAL EXAM  Vitals:   06/03/18 0820  BP: 114/60  Pulse: 70  Resp: 16  Weight: 155 lb 8 oz (70.5 kg)  Height: 5\' 1"  (1.549 m)    Body mass index is 29.38 kg/m.   General: The patient is well-developed and well-nourished and in no acute distress   Neck: The neck is supple, no carotid bruits are noted.  The neck is mildly tender with a reduced range of motion  Skin: Extremities are without significant edema or skin changes  Musculoskeletal: Some tenderness over lower back  Neurologic Exam  Mental status: The  patient is alert and oriented x 3 at the time of the examination. The patient has apparent normal recent and remote memory, with an apparently normal attention span and concentration ability.   Speech is normal.  Cranial nerves: Extraocular movements are full. Pupils are equal, round, and reactive to light and accomodation.  Visual fields are full.  Facial symmetry is present. There is good facial sensation to soft touch bilaterally.Facial strength is normal.  Trapezius and sternocleidomastoid strength is normal. No dysarthria is noted.  The tongue is midline, and the patient has symmetric elevation of the soft palate. No obvious hearing deficits are noted.  Motor:  Muscle bulk is normal.   Tone is normal. Strength is  5 / 5 in all 4 extremities.   Sensory: Sensation was fairly symmetric in the arms.  She  had allodynia over the right leg..  Coordination: Cerebellar testing showed normal finger-nose-finger.  Gait and station: Station is normal.   Her gait is slightly wide and has a reduced stride but she turns well.  .   Tandem gait is mildly wide. Romberg is negative.   Reflexes: Deep tendon reflexes are symmetric and normal bilaterally.   Plantar responses are flexor.    DIAGNOSTIC DATA (LABS, IMAGING, TESTING) - I reviewed patient records, labs, notes, testing and imaging myself where available.  Lab Results  Component Value Date   WBC 4.3 06/01/2017   HGB 13.0 06/01/2017   HCT 37.1 06/01/2017   MCV 84.5 06/01/2017   PLT 196 06/01/2017      Component Value Date/Time   NA 139 06/01/2017 1406   K 4.1 06/01/2017 1406   CL 108 06/01/2017 1406   CO2 23 06/01/2017 1406   GLUCOSE 97 06/01/2017 1406   BUN 9 06/01/2017 1406   CREATININE 0.84 06/01/2017 1406   CREATININE 0.71 08/08/2011 1600   CALCIUM 9.0 06/01/2017 1406   PROT 7.0 06/01/2017 1406   ALBUMIN 4.1 06/01/2017 1406   AST 23 06/01/2017 1406   ALT 21 06/01/2017 1406   ALKPHOS 76 06/01/2017 1406   BILITOT 0.5 06/01/2017  1406   GFRNONAA >60 06/01/2017 1406   GFRNONAA >60 08/08/2011 1600   GFRAA >60 06/01/2017 1406   GFRAA >60 08/08/2011 1600   Lab Results  Component Value Date   CHOL 129 09/02/2012   HDL 60 09/02/2012   LDLCALC 44 09/02/2012   TRIG 124 09/02/2012   CHOLHDL 2.2 09/02/2012   Lab Results  Component Value Date   HGBA1C 5.6 09/02/2012   No results found for: VITAMINB12 Lab Results  Component Value Date   TSH 0.620 08/08/2011       ASSESSMENT AND PLAN  Cervical radiculopathy - Plan: MR CERVICAL SPINE WO CONTRAST  History of fusion of cervical spine - Plan: MR CERVICAL SPINE WO CONTRAST  Neck pain - Plan: MR CERVICAL SPINE WO CONTRAST  Chronic pain disorder  Complex regional pain syndrome type 1 of lower extremity, unspecified laterality  Urinary hesitancy   1.    She continues to experience a lot of neck pain with some radiation into her shoulders.  We need to check another MRI of the cervical spine to determine if she has worsening progressive of her degenerative changes causing radiculopathy, spinal stenosis, etc. 2.     I will have her try lamotrigine to see if the addition of this medication will help the neurogenic component of her pain.  She will titrate the dose as directed over a few weeks.  If no benefit after 2 months she should stop it. 3.    She will return to see me in 4 months and call sooner if new or worsening neurologic symptoms.  40 minutes face-to-face evaluation with greater than one half the time counseling and coordinating care related to her pain syndromes.  Ameri Cahoon A. Felecia Shelling, MD, [facility] 12/13/5425, 0:62 AM Certified in Neurology, Clinical Neurophysiology, Sleep Medicine, Pain Medicine and Neuroimaging  [facility] [address] [place], [place] [address] [phone]

## 2018-06-04 ENCOUNTER — Ambulatory Visit
Admission: RE | Admit: 2018-06-04 | Discharge: 2018-06-04 | Disposition: A | Discharge status: Home / Self Care | Payer: Medicare Other | Source: Ambulatory Visit | Attending: Neurology | Admitting: Neurology

## 2018-06-04 ENCOUNTER — Telehealth: Payer: Self-pay | Admitting: Neurology

## 2018-06-04 DIAGNOSIS — M542 Cervicalgia: Secondary | ICD-10-CM

## 2018-06-04 DIAGNOSIS — M5412 Radiculopathy, cervical region: Secondary | ICD-10-CM

## 2018-06-04 DIAGNOSIS — Z981 Arthrodesis status: Secondary | ICD-10-CM

## 2018-06-04 NOTE — Telephone Encounter (Signed)
Spoke with [name] and explained RAS would like her to have the MRI--he feels imaging of spinal cord will be better with MRI.  She verbalized understanding of same/fim

## 2018-06-04 NOTE — Telephone Encounter (Signed)
Pt has called to inform that due to an accident she had yesterday she went to hospital([place]) and had a CT scan done there(without contrast) pt would like a call back to know if Dr Felecia Shelling can find out that he needs to know from her CT scan at the hospital or if she still needs to have her 11:00am scan done today at GI.  Please call

## 2018-06-08 ENCOUNTER — Other Ambulatory Visit: Payer: Self-pay | Admitting: Physician Assistant

## 2018-06-08 DIAGNOSIS — R1031 Right lower quadrant pain: Secondary | ICD-10-CM

## 2018-06-09 ENCOUNTER — Telehealth: Payer: Self-pay | Admitting: *Deleted

## 2018-06-09 NOTE — Telephone Encounter (Signed)
-----   Message from Britt Bottom, MD sent at 06/04/2018  6:29 PM EDT ----- Please let her know that the MRI of the cervical spine does show some disc protrusions at the levels above the fusion (C4-C5 worse than C3-C4) this has progressed some compared to her previous MRI.  This might be causing some of her symptoms and we could refer her to her neurosurgery for another evaluation.

## 2018-06-09 NOTE — Telephone Encounter (Signed)
Called, LVM for pt to call about results.  

## 2018-06-11 ENCOUNTER — Other Ambulatory Visit: Payer: Self-pay | Admitting: *Deleted

## 2018-06-11 DIAGNOSIS — R9389 Abnormal findings on diagnostic imaging of other specified body structures: Secondary | ICD-10-CM

## 2018-06-11 DIAGNOSIS — M542 Cervicalgia: Secondary | ICD-10-CM

## 2018-06-11 NOTE — Telephone Encounter (Signed)
Spoke to patient - she is aware of results and would like to be further evaluated by neurosurgery.  She has previous had surgery with Dr. Hazle Coca.

## 2018-06-22 ENCOUNTER — Encounter (HOSPITAL_BASED_OUTPATIENT_CLINIC_OR_DEPARTMENT_OTHER): Payer: Self-pay | Admitting: Emergency Medicine

## 2018-06-22 ENCOUNTER — Emergency Department (HOSPITAL_BASED_OUTPATIENT_CLINIC_OR_DEPARTMENT_OTHER): Payer: Medicare Other

## 2018-06-22 ENCOUNTER — Other Ambulatory Visit: Payer: Self-pay

## 2018-06-22 ENCOUNTER — Emergency Department: Payer: Medicare Other

## 2018-06-22 ENCOUNTER — Emergency Department (HOSPITAL_BASED_OUTPATIENT_CLINIC_OR_DEPARTMENT_OTHER)
Admission: EM | Admit: 2018-06-22 | Discharge: 2018-06-22 | Disposition: A | Discharge status: Home / Self Care | Payer: Medicare Other | Attending: Emergency Medicine | Admitting: Emergency Medicine

## 2018-06-22 DIAGNOSIS — R51 Headache: Secondary | ICD-10-CM | POA: Insufficient documentation

## 2018-06-22 DIAGNOSIS — F17200 Nicotine dependence, unspecified, uncomplicated: Secondary | ICD-10-CM | POA: Diagnosis not present

## 2018-06-22 DIAGNOSIS — Z79899 Other long term (current) drug therapy: Secondary | ICD-10-CM | POA: Insufficient documentation

## 2018-06-22 DIAGNOSIS — G35 Multiple sclerosis: Secondary | ICD-10-CM | POA: Insufficient documentation

## 2018-06-22 DIAGNOSIS — R2681 Unsteadiness on feet: Secondary | ICD-10-CM | POA: Diagnosis not present

## 2018-06-22 DIAGNOSIS — R4182 Altered mental status, unspecified: Secondary | ICD-10-CM | POA: Diagnosis present

## 2018-06-22 DIAGNOSIS — Z7982 Long term (current) use of aspirin: Secondary | ICD-10-CM | POA: Insufficient documentation

## 2018-06-22 DIAGNOSIS — N3000 Acute cystitis without hematuria: Secondary | ICD-10-CM | POA: Diagnosis not present

## 2018-06-22 DIAGNOSIS — R519 Headache, unspecified: Secondary | ICD-10-CM

## 2018-06-22 DIAGNOSIS — W19XXXA Unspecified fall, initial encounter: Secondary | ICD-10-CM

## 2018-06-22 LAB — URINALYSIS, ROUTINE W REFLEX MICROSCOPIC
BILIRUBIN URINE: NEGATIVE
GLUCOSE, UA: NEGATIVE mg/dL
HGB URINE DIPSTICK: NEGATIVE
Ketones, ur: NEGATIVE mg/dL
Nitrite: NEGATIVE
Protein, ur: NEGATIVE mg/dL
SPECIFIC GRAVITY, URINE: 1.01 (ref 1.005–1.030)
pH: 6 (ref 5.0–8.0)

## 2018-06-22 LAB — CBC
HEMATOCRIT: 36.9 % (ref 36.0–46.0)
HEMOGLOBIN: 13.3 g/dL (ref 12.0–15.0)
MCH: 30.9 pg (ref 26.0–34.0)
MCHC: 36 g/dL (ref 30.0–36.0)
MCV: 85.6 fL (ref 78.0–100.0)
Platelets: 192 10*3/uL (ref 150–400)
RBC: 4.31 MIL/uL (ref 3.87–5.11)
RDW: 12.2 % (ref 11.5–15.5)
WBC: 3.6 10*3/uL — ABNORMAL LOW (ref 4.0–10.5)

## 2018-06-22 LAB — DIFFERENTIAL
Basophils Absolute: 0 10*3/uL (ref 0.0–0.1)
Basophils Relative: 1 %
EOS ABS: 0.1 10*3/uL (ref 0.0–0.7)
EOS PCT: 2 %
LYMPHS ABS: 1.9 10*3/uL (ref 0.7–4.0)
Lymphocytes Relative: 51 %
MONOS PCT: 10 %
Monocytes Absolute: 0.4 10*3/uL (ref 0.1–1.0)
NEUTROS PCT: 36 %
Neutro Abs: 1.3 10*3/uL — ABNORMAL LOW (ref 1.7–7.7)

## 2018-06-22 LAB — URINALYSIS, MICROSCOPIC (REFLEX): RBC / HPF: NONE SEEN RBC/hpf (ref 0–5)

## 2018-06-22 LAB — COMPREHENSIVE METABOLIC PANEL
ALBUMIN: 3.9 g/dL (ref 3.5–5.0)
ALT: 19 U/L (ref 0–44)
ANION GAP: 8 (ref 5–15)
AST: 22 U/L (ref 15–41)
Alkaline Phosphatase: 85 U/L (ref 38–126)
BILIRUBIN TOTAL: 0.3 mg/dL (ref 0.3–1.2)
BUN: 10 mg/dL (ref 6–20)
CALCIUM: 8.9 mg/dL (ref 8.9–10.3)
CO2: 25 mmol/L (ref 22–32)
Chloride: 109 mmol/L (ref 98–111)
Creatinine, Ser: 0.83 mg/dL (ref 0.44–1.00)
GFR calc Af Amer: >60 mL/min (ref >60)
GFR calc non Af Amer: >60 mL/min (ref >60)
GLUCOSE: 89 mg/dL (ref 70–99)
Potassium: 4 mmol/L (ref 3.5–5.1)
Sodium: 142 mmol/L (ref 135–145)
TOTAL PROTEIN: 7 g/dL (ref 6.5–8.1)

## 2018-06-22 LAB — PROTIME-INR
INR: 1.05
Prothrombin Time: 13.6 seconds (ref 11.4–15.2)

## 2018-06-22 LAB — TROPONIN I: Troponin I: <0.03 ng/mL (ref <0.03)

## 2018-06-22 LAB — MAGNESIUM: Magnesium: 2 mg/dL (ref 1.7–2.4)

## 2018-06-22 MED ORDER — CEPHALEXIN 500 MG PO CAPS: 500.0000 mg | ORAL_CAPSULE | Freq: Two times a day (BID) | ORAL | 0 refills | Status: AC

## 2018-06-22 MED ORDER — PROCHLORPERAZINE EDISYLATE 10 MG/2ML IJ SOLN
10.0000 mg | Freq: Once | INTRAMUSCULAR | Status: AC
  Administered 2018-06-22: 10 mg via INTRAVENOUS
  Filled 2018-06-22: qty 2

## 2018-06-22 MED ORDER — SODIUM CHLORIDE 0.9 % IV BOLUS
1000.0000 mL | Freq: Once | INTRAVENOUS | Status: AC
  Administered 2018-06-22: 1000 mL via INTRAVENOUS

## 2018-06-22 MED ORDER — CEPHALEXIN 250 MG PO CAPS
500.0000 mg | ORAL_CAPSULE | Freq: Once | ORAL | Status: AC
  Administered 2018-06-22: 500 mg via ORAL
  Filled 2018-06-22: qty 2

## 2018-06-22 MED ORDER — DIPHENHYDRAMINE HCL 50 MG/ML IJ SOLN
25.0000 mg | Freq: Once | INTRAMUSCULAR | Status: AC
  Administered 2018-06-22: 25 mg via INTRAVENOUS
  Filled 2018-06-22: qty 1

## 2018-06-22 NOTE — ED Triage Notes (Signed)
Patient states that she was going to the bathroom and fell asleep - she states that she fell asleep and hit her left head. She reports that she has chronic neck pain and he neck is bothering her. Patient is confused and unsteady with her gait. She is very quiet and somewhat confused

## 2018-06-22 NOTE — ED Notes (Signed)
Assisted to BR, gait unsteady. Remains drowsy but oriented x 3. Husband at bedside. ED MD aware

## 2018-06-22 NOTE — Discharge Instructions (Signed)
Your imaging today showed no evidence of fracture or bleeding in your head.  We saw the unchanged screw fracture as we discussed.  Please follow-up with your spine team for this.  We did see evidence of a UTI given your unsteadiness worsened than normal and your change in urine appearance.  Please take the antibiotics to treat this.  Please stay hydrated.  Please follow-up with your primary doctor in several days.  If any symptoms change or worsen, please return to the nearest emergency department.

## 2018-06-22 NOTE — ED Provider Notes (Signed)
[facility] EMERGENCY DEPARTMENT Provider Note   CSN: [id number] Arrival date & time: 06/22/18  0931     History   Chief Complaint Chief Complaint  Patient presents with  . Altered Mental Status    HPI Becky Wolfe is a 57 y.o. female.  The history is provided by the patient and a relative. No language interpreter was used.  Headache   This is a new problem. The current episode started 1 to 2 hours ago. The problem occurs constantly. The problem has not changed since onset.The headache is associated with bright light (a fall). The quality of the pain is described as dull. The pain is moderate. The pain does not radiate. Pertinent negatives include no fever, no malaise/fatigue, no chest pressure, no orthopnea, no palpitations, no shortness of breath, no nausea and no vomiting. She has tried nothing for the symptoms. The treatment provided no relief.    Past Medical History:  Diagnosis Date  . Anxiety 06-16-13   hx. panic attacks, none recent  . Blood transfusion without reported diagnosis    20 yrs ago after childbirth  . Bronchitis    hx of  . Chronic neck pain   . DVT (deep venous thrombosis) ([facility])    rt. leg   . Headache(784.0)    migraines-has decreased  . Heart murmur    Dr. Etter Sjogren  . Hepatitis C    dx. Hep. C(s/p transfusion age 57) low Hgb  .-multiple transfusions.  . Multiple sclerosis ([place])    dx. -58yrs ago, then med stopped-due to liver enzymes changes,occ. periods of weakness in arms"drops things"  . Pneumonia    hx of , ? mild emphysema on CT scan 5'14, multiple pulmonary nodules  . RSD (reflex sympathetic dystrophy) 06-16-13   legs  . TIA (transient ischemic attack)    hx of (weakness left side)-none in 1 yr    Patient Active Problem List   Diagnosis Date Noted  . Neck pain 02/19/2018  . History of fusion of cervical spine 02/19/2018  . Urinary hesitancy 02/19/2018  . Chronic non-specific white matter lesions on MRI 02/19/2018  .  Accidental overdose 03/07/2016  . Atherosclerosis of native coronary artery of native heart without angina pectoris 02/10/2015  . History of DVT (deep vein thrombosis) 02/10/2015  . Tobacco abuse 02/10/2015  . Right leg pain 04/23/2013  . DVT (deep vein thrombosis) in pregnancy ([place]) 04/23/2013  . Chronic pain disorder 04/23/2013  . RSD lower limb 04/23/2013  . Chronic anticoagulation 04/23/2013    Past Surgical History:  Procedure Laterality Date  . ABDOMINAL HYSTERECTOMY    . ANAL RECTAL MANOMETRY N/A 04/11/2015   Procedure: ANO RECTAL MANOMETRY;  Surgeon: Arta Silence, MD;  Location: [facility];  Service: Endoscopy;  Laterality: N/A;  . ANAL RECTAL MANOMETRY N/A 05/21/2017   Procedure: ANO RECTAL MANOMETRY;  Surgeon: Arta Silence, MD;  Location: [facility];  Service: Endoscopy;  Laterality: N/A;  . ANKLE SURGERY Right    no retained hardware  . ANTERIOR CERVICAL DECOMP/DISCECTOMY FUSION  09/21/2012   Procedure: ANTERIOR CERVICAL DECOMPRESSION/DISCECTOMY FUSION 2 LEVELS;  Surgeon: Otilio Connors, MD;  Location: [facility];  Service: Neurosurgery;  Laterality: N/A;  Cervical five-six, cervical seven-thoracic one Anterior cervical decompression/diskectomy/fusion/Lifenet bone/trestle plate/Prior Cervical six--seven Anterior cervical fusion/Removing trestle plate  . APPENDECTOMY    . CESAREAN SECTION    . ESOPHAGOGASTRODUODENOSCOPY (EGD) WITH PROPOFOL N/A 06/30/2013   Procedure: ESOPHAGOGASTRODUODENOSCOPY (EGD) WITH PROPOFOL;  Surgeon: Arta Silence, MD;  Location: [facility];  Service: Endoscopy;  Laterality: N/A;  . NECK SURGERY    . WEDGE RESECTION     left ovary with cyst removed ,hx. multiple cysts removed prior     OB History   None      Home Medications    Prior to Admission medications   Medication Sig Start Date End Date Taking? Authorizing Provider  aspirin EC 81 MG tablet Take 81 mg by mouth daily.     [provider]  clonazePAM (KLONOPIN) 1 MG  tablet Take 1 mg by mouth 2 (two) times daily as needed for anxiety.     [provider]  cloNIDine (CATAPRES) 0.1 MG tablet Take 0.1 mg by mouth every 8 (eight) hours as needed (anxiety).     [provider]  furosemide (LASIX) 20 MG tablet Take 20 mg by mouth daily as needed for fluid.     [provider]  gabapentin (NEURONTIN) 800 MG tablet Take 800 mg by mouth 3 (three) times daily. 05/12/17   [provider]  lamoTRIgine (LAMICTAL) 100 MG tablet Take 1 tablet (100 mg total) by mouth 2 (two) times daily. 06/03/18   Sater, Nanine Means, MD  levorphanol (LEVODROMORAN) 2 MG tablet Take 2 mg by mouth every 6 (six) hours as needed for pain.    [provider]  NUCYNTA 100 MG TABS Take 100 mg by mouth 2 (two) times daily. 05/15/17   [provider]  sertraline (ZOLOFT) 100 MG tablet Take 100 mg by mouth daily.    [provider]  sertraline (ZOLOFT) 50 MG tablet Take 50 mg by mouth daily. 04/30/17   [provider]  tiZANidine (ZANAFLEX) 2 MG tablet  02/18/18   [provider]    Family History Family History  Problem Relation Age of Onset  . Heart attack Mother 90       with CABG  . Heart disease Mother   . Heart attack Father   . Heart disease Father   . Clotting disorder Sister        lung cancer  . Lupus Sister   . Diabetes Brother   . Renal Disease Brother   . Diabetes Sister   . Diabetes Sister   . Healthy Sister   . Healthy Sister   . Healthy Sister     Social History Social History   Tobacco Use  . Smoking status: Current Every Day Smoker    Packs/day: 1.00    Years: 36.00    Pack years: 36.00  . Smokeless tobacco: Never Used  Substance Use Topics  . Alcohol use: No  . Drug use: No     Allergies   Lodine [etodolac]   Review of Systems Review of Systems  Constitutional: Negative for chills, diaphoresis, fatigue, fever and malaise/fatigue.  HENT: Negative for congestion.   Eyes: Positive  for photophobia. Negative for visual disturbance.  Respiratory: Negative for cough, choking, chest tightness, shortness of breath and wheezing.   Cardiovascular: Negative for chest pain, palpitations, orthopnea and leg swelling.  Gastrointestinal: Negative for abdominal pain, constipation, diarrhea, nausea and vomiting.  Genitourinary: Negative for dysuria and flank pain.  Musculoskeletal: Negative for back pain, neck pain and neck stiffness.  Skin: Negative for rash and wound.  Neurological: Positive for headaches. Negative for dizziness, weakness and light-headedness.  Hematological: Negative for adenopathy.  Psychiatric/Behavioral: Positive for confusion. Negative for agitation.  All other systems reviewed and are negative.    Physical Exam Updated Vital Signs BP  128/69 (BP Location: Right Arm)   Pulse (!) 55   Temp 98.5 F (36.9 C) (Oral)   Resp 18   Ht 5\' 2"  (1.575 m)   Wt 68 kg   LMP 10/16/2011   SpO2 100%   BMI 27.42 kg/m   Physical Exam  Constitutional: She is oriented to person, place, and time. She appears well-developed and well-nourished. No distress.  HENT:  Head: Head is with contusion. Head is without raccoon's eyes, without Battle's sign and without laceration. Hair is normal.    Nose: Nose normal.  Mouth/Throat: Oropharynx is clear and moist. No oropharyngeal exudate.  Eyes: Pupils are equal, round, and reactive to light. Conjunctivae are normal.  Neck: Normal range of motion. Neck supple.  Cardiovascular: Normal rate and regular rhythm.  No murmur heard. Pulmonary/Chest: Effort normal and breath sounds normal. No respiratory distress. She has no wheezes. She has no rales. She exhibits no tenderness.  Abdominal: Soft. She exhibits no distension. There is no tenderness.  Musculoskeletal: She exhibits no edema, tenderness or deformity.  Lymphadenopathy:    She has no cervical adenopathy.  Neurological: She is alert and oriented to person, place, and time.  She is not disoriented. She displays no tremor. No cranial nerve deficit or sensory deficit. She exhibits normal muscle tone. GCS eye subscore is 4. GCS verbal subscore is 5. GCS motor subscore is 6.  Gait initially deferred due to unsteadiness.  Will repeat after treatments.  Normal finger-nose-finger testing and normal sensation throughout.  Symmetric grip strength and leg strength.  Skin: Skin is warm. Capillary refill takes less than 2 seconds. No rash noted. She is not diaphoretic. No erythema.  Psychiatric: She has a normal mood and affect.  Nursing note and vitals reviewed.    ED Treatments / Results  Labs (all labs ordered are listed, but only abnormal results are displayed) Labs Reviewed  CBC - Abnormal; Notable for the following components:      Result Value   WBC 3.6 (*)    All other components within normal limits  DIFFERENTIAL - Abnormal; Notable for the following components:   Neutro Abs 1.3 (*)    All other components within normal limits  URINALYSIS, ROUTINE W REFLEX MICROSCOPIC - Abnormal; Notable for the following components:   Leukocytes, UA TRACE (*)    All other components within normal limits  URINALYSIS, MICROSCOPIC (REFLEX) - Abnormal; Notable for the following components:   Bacteria, UA RARE (*)    All other components within normal limits  URINE CULTURE  COMPREHENSIVE METABOLIC PANEL  TROPONIN I  PROTIME-INR  MAGNESIUM    EKG EKG Interpretation  Date/Time:  Monday June 22 2018 09:45:10 EDT Ventricular Rate:  52 PR Interval:    QRS Duration: 95 QT Interval:  433 QTC Calculation: 403 R Axis:   24 Text Interpretation:  Sinus rhythm When compared to prior, slower rate.  No STEMI Confirmed by Antony Blackbird [phone]) on 06/22/2018 10:15:59 AM Also confirmed by Antony Blackbird [phone]), editor Moshe Cipro, Jeannetta Nap [phone])  on 06/22/2018 2:03:53 PM   Radiology Ct Head Wo Contrast  Result Date: 06/22/2018 CLINICAL DATA:  [place] in the bathroom with trauma to  the head and neck. EXAM: CT HEAD WITHOUT CONTRAST CT CERVICAL SPINE WITHOUT CONTRAST TECHNIQUE: Multidetector CT imaging of the head and cervical spine was performed following the standard protocol without intravenous contrast. Multiplanar CT image reconstructions of the cervical spine were also generated. COMPARISON:  06/03/2018.  02/10/2014. FINDINGS: CT HEAD FINDINGS Brain: Chronic small-vessel  ischemic changes are seen affecting the cerebral hemispheric white matter. No identifiable acute infarction, mass lesion, hemorrhage, hydrocephalus or extra-axial collection. Vascular: No abnormal vascular finding. Skull: No skull fracture. Sinuses/Orbits: Clear/normal Other: None CT CERVICAL SPINE FINDINGS Alignment: No traumatic malalignment. Skull base and vertebrae: No fracture or primary bone lesion. Previous ACDF C5-T1. Solid union from C5 through C7. Possible nonunion at C7-T1. Bilateral screw fractures at T1. No change compared to previous exams. Soft tissues and spinal canal: Negative Disc levels: Facet osteoarthritis on the right at C3-4. Mild right C3-4 foraminal narrowing. Upper chest: Negative Other: None IMPRESSION: Head CT: No acute or traumatic finding. Chronic small-vessel ischemic changes of the cerebral hemispheric white matter. Cervical spine CT: No acute or traumatic finding. Previous ACDF C5-T1. Possible chronic nonunion at C7-T1 with chronic T1 screw fractures. No change from older studies as distant as 2015. Electronically Signed   By: Nelson Chimes M.D.   On: 06/22/2018 10:32   Ct Cervical Spine Wo Contrast  Result Date: 06/22/2018 CLINICAL DATA:  [place] in the bathroom with trauma to the head and neck. EXAM: CT HEAD WITHOUT CONTRAST CT CERVICAL SPINE WITHOUT CONTRAST TECHNIQUE: Multidetector CT imaging of the head and cervical spine was performed following the standard protocol without intravenous contrast. Multiplanar CT image reconstructions of the cervical spine were also generated.  COMPARISON:  06/03/2018.  02/10/2014. FINDINGS: CT HEAD FINDINGS Brain: Chronic small-vessel ischemic changes are seen affecting the cerebral hemispheric white matter. No identifiable acute infarction, mass lesion, hemorrhage, hydrocephalus or extra-axial collection. Vascular: No abnormal vascular finding. Skull: No skull fracture. Sinuses/Orbits: Clear/normal Other: None CT CERVICAL SPINE FINDINGS Alignment: No traumatic malalignment. Skull base and vertebrae: No fracture or primary bone lesion. Previous ACDF C5-T1. Solid union from C5 through C7. Possible nonunion at C7-T1. Bilateral screw fractures at T1. No change compared to previous exams. Soft tissues and spinal canal: Negative Disc levels: Facet osteoarthritis on the right at C3-4. Mild right C3-4 foraminal narrowing. Upper chest: Negative Other: None IMPRESSION: Head CT: No acute or traumatic finding. Chronic small-vessel ischemic changes of the cerebral hemispheric white matter. Cervical spine CT: No acute or traumatic finding. Previous ACDF C5-T1. Possible chronic nonunion at C7-T1 with chronic T1 screw fractures. No change from older studies as distant as 2015. Electronically Signed   By: Nelson Chimes M.D.   On: 06/22/2018 10:32    Procedures Procedures (including critical care time)  Medications Ordered in ED Medications  sodium chloride 0.9 % bolus 1,000 mL (0 mLs Intravenous Stopped 06/22/18 1209)  prochlorperazine (COMPAZINE) injection 10 mg (10 mg Intravenous Given 06/22/18 1102)  diphenhydrAMINE (BENADRYL) injection 25 mg (25 mg Intravenous Given 06/22/18 1103)  cephALEXin (KEFLEX) capsule 500 mg (500 mg Oral Given 06/22/18 1514)     Initial Impression / Assessment and Plan / ED Course  I have reviewed the triage vital signs and the nursing notes.  Pertinent labs & imaging results that were available during my care of the patient were reviewed by me and considered in my medical decision making (see chart for details).     ZAMYA CULHANE is a 57 y.o. female with a past medical history significant for chronic neck pain status post neck surgery, multiple sclerosis, prior TIA, DVT not on anticoagulation, RSD of right leg, and chronic pain who presents with a fall.  Patient is coming by family who reports that patient was in the bathroom urinating this morning when she "fell asleep".  She reports she then fell striking her  left forehead on the ground.  She denied loss of consciousness but reports pain in her head and neck.  She also is feeling more unsteady and feeling slightly confused.  She denies any nausea, vomiting, vision changes, constipation, diarrhea, or dysuria.  She denies any chest pain shortness of breath or cough.  No other complaints.  She reports her headache is moderate to severe.  She does report photophobia.  Next  On exam, normal extraocular movements.  Pupils are reactive bilaterally.  Patient's lungs clear and chest was nontender.  No nuchal rigidity.  No facial droop and normal facial sensation.  Abdomen was nontender.  Back nontender.  Patient had some unsteadiness with gait.  No nasal septal hematoma.  No hemotympanum.  Patient has small ecchymosis on left forehead that is minimally tender.  No crepitance.  Patient quickly had a head CT and neck CT.  No intracranial hemorrhage was seen.  Cervical spine CT shows prior surgery with no changes since 2015.  Family was informed of these findings.  Small white matter disease was seen.  Clinically I suspect patient is having a postconcussive syndrome with her headache  and unsteadiness.  Patient was given a headache cocktail and have screening laboratory testing to look for other abnormalities.    Anticipate reassessment after work-up.  Patient will also be ambulated after headache cocktail and rehydration.  Patient was able to ambulate and had improvement in her gait.  Patient thinks that her gait is nearly normal for her.  She reports she typically has difficulty  with ambulation.  She reports her headache is much improved.  Suspect postconcussive pain.  Urinalysis showed leukocytes and bacteria.  Patient was reexamined and she reports that she did have change in urine appearance.    Patient was treated for UTI and will follow-up with her PCP in several days.  Patient felt much better and agreed with plan of discharge.  Patient discharged in good condition with understanding return precautions.   Final Clinical Impressions(s) / ED Diagnoses   Final diagnoses:  Fall, initial encounter  Acute nonintractable headache, unspecified headache type  Acute cystitis without hematuria  Unsteadiness    ED Discharge Orders         Ordered    cephALEXin (KEFLEX) 500 MG capsule  2 times daily     06/22/18 1508          Clinical Impression: 1. Fall, initial encounter   2. Acute nonintractable headache, unspecified headache type   3. Acute cystitis without hematuria   4. Unsteadiness     Disposition: Discharge  Condition: Good  I have discussed the results, Dx and Tx plan with the pt(& family if present). He/she/they expressed understanding and agree(s) with the plan. Discharge instructions discussed at great length. Strict return precautions discussed and pt &/or family have verbalized understanding of the instructions. No further questions at time of discharge.    Discharge Medication List as of 06/22/2018  3:09 PM    START taking these medications   Details  cephALEXin (KEFLEX) 500 MG capsule Take 1 capsule (500 mg total) by mouth 2 (two) times daily for 7 days., Starting Mon 06/22/2018, Until Mon 06/29/2018, Print        Follow Up: Mayra Neer, MD [address] [place] [address] [phone]     [facility]PART[facility] [address] [id number] [address] [place] [phone]       Tymber Stallings, Gwenyth Allegra, MD 06/22/18 [phone]

## 2018-06-22 NOTE — ED Notes (Signed)
Patient is very sleepy  - she is able to talk with this RN  - unable to to rate her pain at this time

## 2018-06-23 LAB — URINE CULTURE: Culture: <10000 — AB

## 2018-06-25 ENCOUNTER — Ambulatory Visit
Admission: RE | Admit: 2018-06-25 | Discharge: 2018-06-25 | Disposition: A | Discharge status: Home / Self Care | Payer: Medicare Other | Source: Ambulatory Visit | Attending: Physician Assistant | Admitting: Physician Assistant

## 2018-06-25 DIAGNOSIS — R1031 Right lower quadrant pain: Secondary | ICD-10-CM

## 2018-06-25 MED ORDER — IOPAMIDOL (ISOVUE-M 200) INJECTION 41%
12.0000 mL | Freq: Once | INTRAMUSCULAR | Status: AC
  Administered 2018-06-25: 12 mL via INTRA_ARTICULAR

## 2018-07-31 ENCOUNTER — Other Ambulatory Visit: Payer: Self-pay | Admitting: Neurological Surgery

## 2018-08-04 ENCOUNTER — Encounter (HOSPITAL_COMMUNITY): Payer: Self-pay | Admitting: *Deleted

## 2018-08-04 ENCOUNTER — Other Ambulatory Visit: Payer: Self-pay

## 2018-08-05 ENCOUNTER — Observation Stay (HOSPITAL_COMMUNITY)
Admission: RE | Admit: 2018-08-05 | Discharge: 2018-08-06 | Disposition: A | Discharge status: Home / Self Care | Payer: Medicare Other | Source: Ambulatory Visit | Attending: Neurological Surgery | Admitting: Neurological Surgery

## 2018-08-05 ENCOUNTER — Other Ambulatory Visit: Payer: Self-pay

## 2018-08-05 ENCOUNTER — Ambulatory Visit (HOSPITAL_COMMUNITY): Payer: Medicare Other | Admitting: Anesthesiology

## 2018-08-05 ENCOUNTER — Ambulatory Visit (HOSPITAL_COMMUNITY): Payer: Medicare Other

## 2018-08-05 ENCOUNTER — Encounter (HOSPITAL_COMMUNITY)
Admission: RE | Disposition: A | Discharge status: Home / Self Care | Payer: Self-pay | Source: Ambulatory Visit | Attending: Neurological Surgery

## 2018-08-05 ENCOUNTER — Encounter (HOSPITAL_COMMUNITY): Payer: Self-pay | Admitting: *Deleted

## 2018-08-05 DIAGNOSIS — Z419 Encounter for procedure for purposes other than remedying health state, unspecified: Secondary | ICD-10-CM

## 2018-08-05 DIAGNOSIS — Z86718 Personal history of other venous thrombosis and embolism: Secondary | ICD-10-CM | POA: Insufficient documentation

## 2018-08-05 DIAGNOSIS — F419 Anxiety disorder, unspecified: Secondary | ICD-10-CM | POA: Insufficient documentation

## 2018-08-05 DIAGNOSIS — Z833 Family history of diabetes mellitus: Secondary | ICD-10-CM | POA: Insufficient documentation

## 2018-08-05 DIAGNOSIS — M4802 Spinal stenosis, cervical region: Secondary | ICD-10-CM | POA: Diagnosis not present

## 2018-08-05 DIAGNOSIS — F329 Major depressive disorder, single episode, unspecified: Secondary | ICD-10-CM | POA: Diagnosis not present

## 2018-08-05 DIAGNOSIS — F17200 Nicotine dependence, unspecified, uncomplicated: Secondary | ICD-10-CM | POA: Diagnosis not present

## 2018-08-05 DIAGNOSIS — G35 Multiple sclerosis: Secondary | ICD-10-CM | POA: Insufficient documentation

## 2018-08-05 DIAGNOSIS — K5900 Constipation, unspecified: Secondary | ICD-10-CM | POA: Diagnosis not present

## 2018-08-05 DIAGNOSIS — Z801 Family history of malignant neoplasm of trachea, bronchus and lung: Secondary | ICD-10-CM | POA: Insufficient documentation

## 2018-08-05 DIAGNOSIS — M5412 Radiculopathy, cervical region: Secondary | ICD-10-CM | POA: Insufficient documentation

## 2018-08-05 DIAGNOSIS — Z8249 Family history of ischemic heart disease and other diseases of the circulatory system: Secondary | ICD-10-CM | POA: Diagnosis not present

## 2018-08-05 DIAGNOSIS — R011 Cardiac murmur, unspecified: Secondary | ICD-10-CM | POA: Insufficient documentation

## 2018-08-05 DIAGNOSIS — G459 Transient cerebral ischemic attack, unspecified: Secondary | ICD-10-CM | POA: Insufficient documentation

## 2018-08-05 DIAGNOSIS — G9050 Complex regional pain syndrome I, unspecified: Secondary | ICD-10-CM | POA: Insufficient documentation

## 2018-08-05 DIAGNOSIS — Z832 Family history of diseases of the blood and blood-forming organs and certain disorders involving the immune mechanism: Secondary | ICD-10-CM | POA: Diagnosis not present

## 2018-08-05 DIAGNOSIS — G43909 Migraine, unspecified, not intractable, without status migrainosus: Secondary | ICD-10-CM | POA: Diagnosis not present

## 2018-08-05 DIAGNOSIS — Z8489 Family history of other specified conditions: Secondary | ICD-10-CM | POA: Diagnosis not present

## 2018-08-05 DIAGNOSIS — G959 Disease of spinal cord, unspecified: Secondary | ICD-10-CM | POA: Diagnosis present

## 2018-08-05 DIAGNOSIS — B1920 Unspecified viral hepatitis C without hepatic coma: Secondary | ICD-10-CM | POA: Insufficient documentation

## 2018-08-05 DIAGNOSIS — Z841 Family history of disorders of kidney and ureter: Secondary | ICD-10-CM | POA: Diagnosis not present

## 2018-08-05 HISTORY — DX: Major depressive disorder, single episode, unspecified: F32.9

## 2018-08-05 HISTORY — DX: Depression, unspecified: F32.A

## 2018-08-05 HISTORY — PX: ANTERIOR CERVICAL DECOMP/DISCECTOMY FUSION: SHX1161

## 2018-08-05 HISTORY — DX: Constipation, unspecified: K59.00

## 2018-08-05 LAB — CBC
HEMATOCRIT: 39.3 % (ref 36.0–46.0)
HEMOGLOBIN: 13.4 g/dL (ref 12.0–15.0)
MCH: 29.7 pg (ref 26.0–34.0)
MCHC: 34.1 g/dL (ref 30.0–36.0)
MCV: 87.1 fL (ref 78.0–100.0)
Platelets: 176 10*3/uL (ref 150–400)
RBC: 4.51 MIL/uL (ref 3.87–5.11)
RDW: 12 % (ref 11.5–15.5)
WBC: 3.5 10*3/uL — ABNORMAL LOW (ref 4.0–10.5)

## 2018-08-05 LAB — TYPE AND SCREEN
ABO/RH(D): B POS
Antibody Screen: NEGATIVE

## 2018-08-05 LAB — BASIC METABOLIC PANEL
Anion gap: 11 (ref 5–15)
BUN: 10 mg/dL (ref 6–20)
CHLORIDE: 108 mmol/L (ref 98–111)
CO2: 23 mmol/L (ref 22–32)
CREATININE: 0.79 mg/dL (ref 0.44–1.00)
Calcium: 9.1 mg/dL (ref 8.9–10.3)
GFR calc non Af Amer: >60 mL/min (ref >60)
Glucose, Bld: 111 mg/dL — ABNORMAL HIGH (ref 70–99)
POTASSIUM: 4 mmol/L (ref 3.5–5.1)
Sodium: 142 mmol/L (ref 135–145)

## 2018-08-05 LAB — ABO/RH: ABO/RH(D): B POS

## 2018-08-05 SURGERY — ANTERIOR CERVICAL DECOMPRESSION/DISCECTOMY FUSION 1 LEVEL
Anesthesia: General | Site: Spine Cervical

## 2018-08-05 MED ORDER — FENTANYL CITRATE (PF) 100 MCG/2ML IJ SOLN
INTRAMUSCULAR | Status: AC
  Filled 2018-08-05: qty 2

## 2018-08-05 MED ORDER — HYDROMORPHONE HCL 1 MG/ML IJ SOLN: 0.5000 mg | INTRAMUSCULAR | Status: DC | PRN

## 2018-08-05 MED ORDER — LEVORPHANOL TARTRATE 2 MG PO TABS: 2.0000 mg | ORAL_TABLET | Freq: Three times a day (TID) | ORAL | Status: DC | PRN

## 2018-08-05 MED ORDER — SODIUM CHLORIDE 0.9 % IV SOLN: INTRAVENOUS | Status: DC

## 2018-08-05 MED ORDER — SUGAMMADEX SODIUM 200 MG/2ML IV SOLN
INTRAVENOUS | Status: DC | PRN
  Administered 2018-08-05: 150 mg via INTRAVENOUS

## 2018-08-05 MED ORDER — CLONAZEPAM 1 MG PO TABS: 1.0000 mg | ORAL_TABLET | Freq: Two times a day (BID) | ORAL | Status: DC | PRN

## 2018-08-05 MED ORDER — MENTHOL 3 MG MT LOZG
1.0000 | LOZENGE | OROMUCOSAL | Status: DC | PRN
  Filled 2018-08-05: qty 9

## 2018-08-05 MED ORDER — CEFAZOLIN SODIUM-DEXTROSE 2-4 GM/100ML-% IV SOLN
INTRAVENOUS | Status: AC
  Filled 2018-08-05: qty 100

## 2018-08-05 MED ORDER — SODIUM CHLORIDE 0.9% FLUSH
3.0000 mL | Freq: Two times a day (BID) | INTRAVENOUS | Status: DC
  Administered 2018-08-05: 3 mL via INTRAVENOUS

## 2018-08-05 MED ORDER — PROPOFOL 10 MG/ML IV BOLUS
INTRAVENOUS | Status: DC | PRN
  Administered 2018-08-05: 170 mg via INTRAVENOUS

## 2018-08-05 MED ORDER — CHLORHEXIDINE GLUCONATE CLOTH 2 % EX PADS: 6.0000 | MEDICATED_PAD | Freq: Once | CUTANEOUS | Status: DC

## 2018-08-05 MED ORDER — LIDOCAINE-EPINEPHRINE 1 %-1:100000 IJ SOLN
INTRAMUSCULAR | Status: AC
  Filled 2018-08-05: qty 1

## 2018-08-05 MED ORDER — FUROSEMIDE 20 MG PO TABS: 20.0000 mg | ORAL_TABLET | Freq: Every day | ORAL | Status: DC | PRN

## 2018-08-05 MED ORDER — SODIUM CHLORIDE 0.9 % IV SOLN
INTRAVENOUS | Status: DC | PRN
  Administered 2018-08-05: 20 ug/min via INTRAVENOUS

## 2018-08-05 MED ORDER — CEFAZOLIN SODIUM-DEXTROSE 2-4 GM/100ML-% IV SOLN
2.0000 g | INTRAVENOUS | Status: AC
  Administered 2018-08-05: 2 g via INTRAVENOUS

## 2018-08-05 MED ORDER — ACETAMINOPHEN 650 MG RE SUPP: 650.0000 mg | RECTAL | Status: DC | PRN

## 2018-08-05 MED ORDER — MIDAZOLAM HCL 5 MG/5ML IJ SOLN
INTRAMUSCULAR | Status: DC | PRN
  Administered 2018-08-05: 2 mg via INTRAVENOUS

## 2018-08-05 MED ORDER — LIDOCAINE 2% (20 MG/ML) 5 ML SYRINGE
INTRAMUSCULAR | Status: DC | PRN
  Administered 2018-08-05: 20 mg via INTRAVENOUS

## 2018-08-05 MED ORDER — ONDANSETRON HCL 4 MG/2ML IJ SOLN: 4.0000 mg | Freq: Once | INTRAMUSCULAR | Status: DC | PRN

## 2018-08-05 MED ORDER — LACTATED RINGERS IV SOLN
INTRAVENOUS | Status: DC
  Administered 2018-08-05 (×2): via INTRAVENOUS

## 2018-08-05 MED ORDER — OXYCODONE HCL 5 MG/5ML PO SOLN: 5.0000 mg | Freq: Once | ORAL | Status: DC | PRN

## 2018-08-05 MED ORDER — SODIUM CHLORIDE 0.9 % IV SOLN: 250.0000 mL | INTRAVENOUS | Status: DC

## 2018-08-05 MED ORDER — ONDANSETRON HCL 4 MG/2ML IJ SOLN
INTRAMUSCULAR | Status: AC
  Filled 2018-08-05: qty 2

## 2018-08-05 MED ORDER — SODIUM CHLORIDE 0.9% FLUSH: 3.0000 mL | INTRAVENOUS | Status: DC | PRN

## 2018-08-05 MED ORDER — ACETAMINOPHEN 325 MG PO TABS
650.0000 mg | ORAL_TABLET | ORAL | Status: DC | PRN
  Administered 2018-08-06: 650 mg via ORAL
  Filled 2018-08-05: qty 2

## 2018-08-05 MED ORDER — CEFAZOLIN SODIUM-DEXTROSE 2-4 GM/100ML-% IV SOLN
2.0000 g | Freq: Three times a day (TID) | INTRAVENOUS | Status: AC
  Administered 2018-08-05 – 2018-08-06 (×2): 2 g via INTRAVENOUS
  Filled 2018-08-05 (×2): qty 100

## 2018-08-05 MED ORDER — KETAMINE HCL 10 MG/ML IJ SOLN
INTRAMUSCULAR | Status: DC | PRN
  Administered 2018-08-05: 10 mg via INTRAVENOUS
  Administered 2018-08-05: 5 mg via INTRAVENOUS
  Administered 2018-08-05: 25 mg via INTRAVENOUS

## 2018-08-05 MED ORDER — FENTANYL CITRATE (PF) 250 MCG/5ML IJ SOLN
INTRAMUSCULAR | Status: AC
  Filled 2018-08-05: qty 5

## 2018-08-05 MED ORDER — OXYCODONE HCL 5 MG PO TABS
10.0000 mg | ORAL_TABLET | ORAL | Status: DC | PRN
  Administered 2018-08-05 – 2018-08-06 (×3): 10 mg via ORAL
  Filled 2018-08-05 (×3): qty 2

## 2018-08-05 MED ORDER — VECURONIUM BROMIDE 10 MG IV SOLR
INTRAVENOUS | Status: AC
  Filled 2018-08-05: qty 10

## 2018-08-05 MED ORDER — LACTATED RINGERS IV SOLN
INTRAVENOUS | Status: DC | PRN
  Administered 2018-08-05: 13:00:00 via INTRAVENOUS

## 2018-08-05 MED ORDER — SODIUM CHLORIDE 0.9 % IV SOLN: INTRAVENOUS | Status: DC | PRN

## 2018-08-05 MED ORDER — MIDAZOLAM HCL 2 MG/2ML IJ SOLN
INTRAMUSCULAR | Status: AC
  Filled 2018-08-05: qty 2

## 2018-08-05 MED ORDER — DEXAMETHASONE SODIUM PHOSPHATE 10 MG/ML IJ SOLN
INTRAMUSCULAR | Status: AC
  Filled 2018-08-05: qty 1

## 2018-08-05 MED ORDER — ROCURONIUM BROMIDE 50 MG/5ML IV SOSY
PREFILLED_SYRINGE | INTRAVENOUS | Status: AC
  Filled 2018-08-05: qty 10

## 2018-08-05 MED ORDER — ONDANSETRON HCL 4 MG/2ML IJ SOLN
4.0000 mg | Freq: Four times a day (QID) | INTRAMUSCULAR | Status: DC | PRN
  Administered 2018-08-05: 4 mg via INTRAVENOUS
  Filled 2018-08-05: qty 2

## 2018-08-05 MED ORDER — DEXAMETHASONE SODIUM PHOSPHATE 10 MG/ML IJ SOLN
INTRAMUSCULAR | Status: DC | PRN
  Administered 2018-08-05: 10 mg via INTRAVENOUS

## 2018-08-05 MED ORDER — POLYETHYLENE GLYCOL 3350 17 G PO PACK: 17.0000 g | PACK | Freq: Every day | ORAL | Status: DC | PRN

## 2018-08-05 MED ORDER — TIZANIDINE HCL 4 MG PO TABS: 2.0000 mg | ORAL_TABLET | Freq: Three times a day (TID) | ORAL | Status: DC | PRN

## 2018-08-05 MED ORDER — OXYCODONE HCL 5 MG PO TABS: 5.0000 mg | ORAL_TABLET | Freq: Once | ORAL | Status: DC | PRN

## 2018-08-05 MED ORDER — SERTRALINE HCL 50 MG PO TABS
100.0000 mg | ORAL_TABLET | Freq: Every day | ORAL | Status: DC
  Administered 2018-08-05: 100 mg via ORAL
  Filled 2018-08-05: qty 2

## 2018-08-05 MED ORDER — SODIUM CHLORIDE 0.9 % IV SOLN
INTRAVENOUS | Status: DC | PRN
  Administered 2018-08-05: 500 mL

## 2018-08-05 MED ORDER — 0.9 % SODIUM CHLORIDE (POUR BTL) OPTIME
TOPICAL | Status: DC | PRN
  Administered 2018-08-05: 1000 mL

## 2018-08-05 MED ORDER — LAMOTRIGINE 100 MG PO TABS
100.0000 mg | ORAL_TABLET | Freq: Two times a day (BID) | ORAL | Status: DC
  Filled 2018-08-05 (×2): qty 1

## 2018-08-05 MED ORDER — ONDANSETRON HCL 4 MG/2ML IJ SOLN
INTRAMUSCULAR | Status: DC | PRN
  Administered 2018-08-05: 4 mg via INTRAVENOUS

## 2018-08-05 MED ORDER — ROCURONIUM BROMIDE 10 MG/ML (PF) SYRINGE
PREFILLED_SYRINGE | INTRAVENOUS | Status: DC | PRN
  Administered 2018-08-05: 20 mg via INTRAVENOUS
  Administered 2018-08-05: 10 mg via INTRAVENOUS
  Administered 2018-08-05: 50 mg via INTRAVENOUS
  Administered 2018-08-05: 20 mg via INTRAVENOUS
  Administered 2018-08-05: 10 mg via INTRAVENOUS

## 2018-08-05 MED ORDER — ONDANSETRON HCL 4 MG PO TABS
4.0000 mg | ORAL_TABLET | Freq: Four times a day (QID) | ORAL | Status: DC | PRN
  Administered 2018-08-06: 4 mg via ORAL
  Filled 2018-08-05: qty 1

## 2018-08-05 MED ORDER — PHENOL 1.4 % MT LIQD
1.0000 | OROMUCOSAL | Status: DC | PRN
  Filled 2018-08-05: qty 177

## 2018-08-05 MED ORDER — GABAPENTIN 400 MG PO CAPS
800.0000 mg | ORAL_CAPSULE | Freq: Three times a day (TID) | ORAL | Status: DC
  Administered 2018-08-05: 800 mg via ORAL
  Filled 2018-08-05: qty 2

## 2018-08-05 MED ORDER — CLONIDINE HCL 0.1 MG PO TABS
0.1000 mg | ORAL_TABLET | Freq: Two times a day (BID) | ORAL | Status: DC
  Administered 2018-08-05: 0.1 mg via ORAL
  Filled 2018-08-05: qty 1

## 2018-08-05 MED ORDER — FENTANYL CITRATE (PF) 100 MCG/2ML IJ SOLN
25.0000 ug | INTRAMUSCULAR | Status: DC | PRN
  Administered 2018-08-05 (×3): 50 ug via INTRAVENOUS

## 2018-08-05 MED ORDER — THROMBIN 5000 UNITS EX SOLR
CUTANEOUS | Status: AC
  Filled 2018-08-05: qty 15000

## 2018-08-05 MED ORDER — THROMBIN 5000 UNITS EX SOLR
OROMUCOSAL | Status: DC | PRN
  Administered 2018-08-05: 5 mL via TOPICAL

## 2018-08-05 MED ORDER — FENTANYL CITRATE (PF) 250 MCG/5ML IJ SOLN
INTRAMUSCULAR | Status: DC | PRN
  Administered 2018-08-05 (×5): 50 ug via INTRAVENOUS

## 2018-08-05 MED ORDER — LIDOCAINE 2% (20 MG/ML) 5 ML SYRINGE
INTRAMUSCULAR | Status: AC
  Filled 2018-08-05: qty 5

## 2018-08-05 MED ORDER — DOCUSATE SODIUM 100 MG PO CAPS
100.0000 mg | ORAL_CAPSULE | Freq: Two times a day (BID) | ORAL | Status: DC
  Administered 2018-08-05: 100 mg via ORAL
  Filled 2018-08-05: qty 1

## 2018-08-05 MED ORDER — TAPENTADOL HCL 50 MG PO TABS
100.0000 mg | ORAL_TABLET | Freq: Two times a day (BID) | ORAL | Status: DC
  Administered 2018-08-05: 100 mg via ORAL
  Filled 2018-08-05: qty 2

## 2018-08-05 MED ORDER — LIDOCAINE-EPINEPHRINE 1 %-1:100000 IJ SOLN
INTRAMUSCULAR | Status: DC | PRN
  Administered 2018-08-05: 8 mL

## 2018-08-05 MED ORDER — KETAMINE HCL 50 MG/5ML IJ SOSY
PREFILLED_SYRINGE | INTRAMUSCULAR | Status: AC
  Filled 2018-08-05: qty 5

## 2018-08-05 SURGICAL SUPPLY — 61 items
ADH SKN CLS APL DERMABOND .7 (GAUZE/BANDAGES/DRESSINGS) ×1
APL SKNCLS STERI-STRIP NONHPOA (GAUZE/BANDAGES/DRESSINGS)
BAG DECANTER FOR FLEXI CONT (MISCELLANEOUS) ×2 IMPLANT
BENZOIN TINCTURE PRP APPL 2/3 (GAUZE/BANDAGES/DRESSINGS) IMPLANT
BLADE CLIPPER SURG (BLADE) IMPLANT
BLADE SURG 11 STRL SS (BLADE) ×1 IMPLANT
BUR MATCHSTICK NEURO 3.0 LAGG (BURR) ×2 IMPLANT
CANISTER SUCT 3000ML PPV (MISCELLANEOUS) ×2 IMPLANT
DECANTER SPIKE VIAL GLASS SM (MISCELLANEOUS) ×2 IMPLANT
DERMABOND ADVANCED (GAUZE/BANDAGES/DRESSINGS) ×1
DERMABOND ADVANCED .7 DNX12 (GAUZE/BANDAGES/DRESSINGS) ×1 IMPLANT
DRAPE C-ARM 42X72 X-RAY (DRAPES) ×4 IMPLANT
DRAPE HALF SHEET 40X57 (DRAPES) IMPLANT
DRAPE LAPAROTOMY 100X72 PEDS (DRAPES) ×2 IMPLANT
DRAPE MICROSCOPE LEICA (MISCELLANEOUS) ×2 IMPLANT
DURAPREP 6ML APPLICATOR 50/CS (WOUND CARE) ×2 IMPLANT
ELECT COATED BLADE 2.86 ST (ELECTRODE) ×2 IMPLANT
ELECT REM PT RETURN 9FT ADLT (ELECTROSURGICAL) ×2
ELECTRODE REM PT RTRN 9FT ADLT (ELECTROSURGICAL) ×1 IMPLANT
FLOSEAL 5ML (HEMOSTASIS) ×1 IMPLANT
GAUZE 4X4 16PLY RFD (DISPOSABLE) IMPLANT
GLOVE BIO SURGEON STRL SZ7.5 (GLOVE) ×2 IMPLANT
GLOVE BIOGEL PI IND STRL 6.5 (GLOVE) IMPLANT
GLOVE BIOGEL PI IND STRL 7.5 (GLOVE) ×2 IMPLANT
GLOVE BIOGEL PI INDICATOR 6.5 (GLOVE) ×2
GLOVE BIOGEL PI INDICATOR 7.5 (GLOVE) ×1
GLOVE EXAM NITRILE LRG STRL (GLOVE) IMPLANT
GLOVE EXAM NITRILE XL STR (GLOVE) IMPLANT
GLOVE EXAM NITRILE XS STR PU (GLOVE) IMPLANT
GLOVE SS BIOGEL STRL SZ 6.5 (GLOVE) IMPLANT
GLOVE SUPERSENSE BIOGEL SZ 6.5 (GLOVE) ×2
GOWN STRL REUS W/ TWL LRG LVL3 (GOWN DISPOSABLE) ×2 IMPLANT
GOWN STRL REUS W/ TWL XL LVL3 (GOWN DISPOSABLE) IMPLANT
GOWN STRL REUS W/TWL 2XL LVL3 (GOWN DISPOSABLE) IMPLANT
GOWN STRL REUS W/TWL LRG LVL3 (GOWN DISPOSABLE) ×6
GOWN STRL REUS W/TWL XL LVL3 (GOWN DISPOSABLE)
HEMOSTAT POWDER KIT SURGIFOAM (HEMOSTASIS) ×1 IMPLANT
KIT BASIN OR (CUSTOM PROCEDURE TRAY) ×2 IMPLANT
KIT TURNOVER KIT B (KITS) ×2 IMPLANT
NDL SPNL 18GX3.5 QUINCKE PK (NEEDLE) ×1 IMPLANT
NEEDLE HYPO 22GX1.5 SAFETY (NEEDLE) ×2 IMPLANT
NEEDLE SPNL 18GX3.5 QUINCKE PK (NEEDLE) ×2 IMPLANT
NS IRRIG 1000ML POUR BTL (IV SOLUTION) ×2 IMPLANT
PACK LAMINECTOMY NEURO (CUSTOM PROCEDURE TRAY) ×2 IMPLANT
PAD ARMBOARD 7.5X6 YLW CONV (MISCELLANEOUS) ×6 IMPLANT
PIN DISTRACTION 14MM (PIN) ×4 IMPLANT
PLATE 23MM (Plate) ×1 IMPLANT
RUBBERBAND STERILE (MISCELLANEOUS) ×4 IMPLANT
SCREW RESCUE 13MM (Screw) ×2 IMPLANT
SCREW RESCUE VAR ANGLE 13 (Screw) IMPLANT
SCREW SELF TAP VAR 4.0X13 (Screw) ×3 IMPLANT
SPACER BONE CORNERSTONE 5X14 (Orthopedic Implant) ×1 IMPLANT
SPONGE INTESTINAL PEANUT (DISPOSABLE) ×3 IMPLANT
SPONGE SURGIFOAM ABS GEL SZ50 (HEMOSTASIS) IMPLANT
STAPLER VISISTAT 35W (STAPLE) IMPLANT
SUT MNCRL AB 3-0 PS2 18 (SUTURE) ×2 IMPLANT
SUT VIC AB 3-0 SH 8-18 (SUTURE) ×2 IMPLANT
TAPE CLOTH 3X10 TAN LF (GAUZE/BANDAGES/DRESSINGS) ×1 IMPLANT
TOWEL GREEN STERILE (TOWEL DISPOSABLE) ×2 IMPLANT
TOWEL GREEN STERILE FF (TOWEL DISPOSABLE) ×2 IMPLANT
WATER STERILE IRR 1000ML POUR (IV SOLUTION) ×2 IMPLANT

## 2018-08-05 NOTE — Transfer of Care (Signed)
Immediate Anesthesia Transfer of Care Note  Patient: Becky Wolfe  Procedure(s) Performed: Cervical four-five Anterior cervical decompression/discectomy/fusion (N/A Spine Cervical)  Patient Location: PACU  Anesthesia Type:General  Level of Consciousness: awake, alert , drowsy and patient cooperative  Airway & Oxygen Therapy: Patient Spontanous Breathing and Patient connected to nasal cannula oxygen  Post-op Assessment: Report given to RN and Post -op Vital signs reviewed and stable  Post vital signs: Reviewed  Last Vitals: 170/83, 87, 16, 100% Vitals Value Taken Time  BP 170/83 08/05/2018  4:18 PM  Temp    Pulse 86 08/05/2018  4:21 PM  Resp 15 08/05/2018  4:21 PM  SpO2 100 % 08/05/2018  4:21 PM  Vitals shown include unvalidated device data.  Last Pain:  Vitals:   08/05/18 1055  TempSrc:   PainSc: 7       Patients Stated Pain Goal: 5 (26/94/85 4627)  Complications: No apparent anesthesia complications

## 2018-08-05 NOTE — H&P (Signed)
Neurosurgery Preop H&P  HPI: This is a 57 y.o. woman that presented with severe neck pain, arm numbness, arm weakness, and worsening ambulation with poor balance. MRI showed central cervical stenosis and she is now here for surgical treatment. No changes in symptoms since she was last seen in clinic.   ROS: A 14 point ROS was performed and is negative except as noted in the HPI.   PMHx:  Past Medical History:  Diagnosis Date  . Anxiety 06-16-13   hx. panic attacks, none recent  . Blood transfusion without reported diagnosis    20 yrs ago after childbirth  . Bronchitis    hx of  . Chronic neck pain   . Constipation   . Depression   . DVT (deep venous thrombosis) ([place]) 2015   rt. leg   . Headache(784.0)    migraines-has decreased  . Heart murmur    Dr. Etter Sjogren  . Hepatitis C    dx. Hep. C(s/p transfusion age 57) low Hgb  .-multiple transfusions. Treated successfully  . Multiple sclerosis ([place])    dx. -62yrs ago, then med stopped-due to liver enzymes changes,occ. periods of weakness in arms"d.  was [facility] at [facility] and tolded that she does not have MSrops things"  . Pneumonia    hx of , ? mild emphysema on CT scan 5'14, multiple pulmonary nodules  . RSD (reflex sympathetic dystrophy) 06-16-13   legs  . TIA (transient ischemic attack)    hx of (weakness left side)-none in 1 yr   FamHx:  Family History  Problem Relation Age of Onset  . Heart attack Mother 66       with CABG  . Heart disease Mother   . Heart attack Father   . Heart disease Father   . Clotting disorder Sister        lung cancer  . Lupus Sister   . Diabetes Brother   . Renal Disease Brother   . Diabetes Sister   . Diabetes Sister   . Healthy Sister   . Healthy Sister   . Healthy Sister    SocHx:  reports that she has been smoking. She has a 4.32 pack-year smoking history. She has never used smokeless tobacco. She reports that she does not drink alcohol or use drugs.  Exam: Vital signs in last 24  hours: Temp:  [98.4 F (36.9 C)] 98.4 F (36.9 C) (09/25 1050) Pulse Rate:  [51] 51 (09/25 1050) Resp:  [18] 18 (09/25 1050) BP: (156)/(61) 156/61 (09/25 1050) SpO2:  [99 %] 99 % (09/25 1050) Weight:  [71.7 kg] 71.7 kg (09/25 1050) General: Awake, alert, cooperative, lying in bed in NAD Head: normocephalic and atruamatic HEENT: neck supple, prior incision completely healed Pulmonary: breathing room air comfortably, no evidence of increased work of breathing Cardiac: RRR Abdomen: S NT ND Extremities: warm and well perfused x4 Neuro: AOx3, PERRL, EOMI, FS Strength 5/5 except pain limited in the BUE, numbness in the right C5 distribution, sensation otherwise intact  Assessment and Plan: 57 y.o. woman with central canal stenosis and worsening ambulation and balance, concerning for cervical myleopathy as well as radicular symptoms concerning for right C5 radiculopathy. Pt here for surgery, still having symptoms and wishes to proceed with surgery.  -OR for C4-C5 ACDF -3C post-op -likely discharge in AM  Judith Part, MD 08/05/18 12:25 PM [facility]

## 2018-08-05 NOTE — Anesthesia Procedure Notes (Signed)
Procedure Name: Intubation Date/Time: 08/05/2018 12:46 PM Performed by: Marsa Aris, CRNA Pre-anesthesia Checklist: Patient identified, Emergency Drugs available, Suction available and Patient being monitored Patient Re-evaluated:Patient Re-evaluated prior to induction Oxygen Delivery Method: Circle System Utilized Preoxygenation: Pre-oxygenation with 100% oxygen Induction Type: IV induction Ventilation: Mask ventilation without difficulty Laryngoscope Size: Glidescope and 3 Grade View: Grade I Tube type: Oral Tube size: 7.0 mm Number of attempts: 1 Airway Equipment and Method: Stylet,  Oral airway and Video-laryngoscopy Placement Confirmation: ETT inserted through vocal cords under direct vision,  positive ETCO2 and breath sounds checked- equal and bilateral Secured at: 21 cm Tube secured with: Tape Dental Injury: Teeth and Oropharynx as per pre-operative assessment  Comments: No change in dentition from pre-procedure. C-spine neutrality maintained.

## 2018-08-05 NOTE — Progress Notes (Signed)
Neurosurgery Service Post-operative progress note  Assessment & Plan: 57 y.o. woman s/p C4-5 ACDF, recovering well. Some C5 parasthesias immediately post-op while her numbness was resolving, which I discussed is normal when sensation returns. Neck is soft, sitting up in a chair in PACU doing well. -recovering well -obs overnight, likely discharge in AM  [place]  06/18/18 5:08 PM

## 2018-08-05 NOTE — Anesthesia Postprocedure Evaluation (Signed)
Anesthesia Post Note  Patient: Becky Wolfe  Procedure(s) Performed: Cervical four-five Anterior cervical decompression/discectomy/fusion (N/A Spine Cervical)     Patient location during evaluation: PACU Anesthesia Type: General Level of consciousness: awake and alert Pain management: pain level controlled Vital Signs Assessment: post-procedure vital signs reviewed and stable Respiratory status: spontaneous breathing, nonlabored ventilation, respiratory function stable and patient connected to nasal cannula oxygen Cardiovascular status: blood pressure returned to baseline and stable Postop Assessment: no apparent nausea or vomiting Anesthetic complications: no    Last Vitals:  Vitals:   08/05/18 1944 08/05/18 2007  BP: (!) 163/72 (!) 158/62  Pulse: (!) 56 (!) 56  Resp: 15 18  Temp:  36.9 C  SpO2: 97% 97%    Last Pain:  Vitals:   08/05/18 2203  TempSrc:   PainSc: 4                  Shambhavi Salley COKER

## 2018-08-05 NOTE — Brief Op Note (Signed)
08/05/2018  4:04 PM  PATIENT:  Grayland Ormond [place]  57 y.o. female  PRE-OPERATIVE DIAGNOSIS:  Cervical stenosis of spinal canal  POST-OPERATIVE DIAGNOSIS:  Cervical stenosis of spinal canal  PROCEDURE:  Procedure(s): Cervical four-five Anterior cervical decompression/discectomy/fusion (N/A)  SURGEON:  Surgeon(s) and Role:    * Judith Part, MD - Primary    * Newman Pies, MD - Assisting  PHYSICIAN ASSISTANT:   ANESTHESIA:   general  EBL:  15 mL   BLOOD ADMINISTERED:none  DRAINS: none   LOCAL MEDICATIONS USED:  LIDOCAINE   SPECIMEN:  No Specimen  DISPOSITION OF SPECIMEN:  N/A  COUNTS:  YES  TOURNIQUET:  * No tourniquets in log *  DICTATION: .Note written in EPIC  PLAN OF CARE: Admit for overnight observation  PATIENT DISPOSITION:  PACU - hemodynamically stable.   Delay start of Pharmacological VTE agent (>24hrs) due to surgical blood loss or risk of bleeding: yes

## 2018-08-05 NOTE — Anesthesia Preprocedure Evaluation (Signed)
Anesthesia Evaluation  Patient identified by MRN, date of birth, ID band Patient awake    Reviewed: Allergy & Precautions, NPO status , Patient's Chart, lab work & pertinent test results  Airway Mallampati: II  TM Distance: >3 FB Neck ROM: Limited    Dental  (+) Teeth Intact, Dental Advisory Given   Pulmonary Current Smoker,    breath sounds clear to auscultation       Cardiovascular  Rhythm:Regular Rate:Normal     Neuro/Psych    GI/Hepatic   Endo/Other    Renal/GU      Musculoskeletal   Abdominal   Peds  Hematology   Anesthesia Other Findings   Reproductive/Obstetrics                             Anesthesia Physical Anesthesia Plan  ASA: III  Anesthesia Plan: General   Post-op Pain Management:    Induction: Intravenous  PONV Risk Score and Plan: Ondansetron and Dexamethasone  Airway Management Planned:   Additional Equipment:   Intra-op Plan:   Post-operative Plan: Extubation in OR  Informed Consent: I have reviewed the patients History and Physical, chart, labs and discussed the procedure including the risks, benefits and alternatives for the proposed anesthesia with the patient or authorized representative who has indicated his/her understanding and acceptance.   Dental advisory given  Plan Discussed with: CRNA and Anesthesiologist  Anesthesia Plan Comments:         Anesthesia Quick Evaluation

## 2018-08-05 NOTE — Op Note (Signed)
PATIENT: Becky Wolfe  DATE:08/05/18   PRE-OPERATIVE DIAGNOSIS:  Cervical myelopathy, cervical radiculopathy   POST-OPERATIVE DIAGNOSIS:  Cervical myelopathy, cervical radiculopathy   PROCEDURE:  C4-C5 Anterior Cervical Discectomy and Instrumented Fusion   SURGEON:  Surgeon(s) and Role:    Judith Part, MD - Primary    Newman Pies MD - Assisting   ANESTHESIA: ETGA   BRIEF HISTORY: This is a 57yo woman who presented with worsening ambulation and balance as well as right C5 numbness and pain with neck pain. The patient was found to have progression of her cervical disease at C4-5, which was no causing severe canal stenosis and likely the cause of her myelopathy. This was discussed with the patient as well as risks, benefits, and alternatives and the patient wished to proceed with surgery.   OPERATIVE DETAIL: The patient was taken to the operating room and placed on the OR table in the supine position. A formal time out was performed with two patient identifiers and confirmed the operative site. Anesthesia was induced by the anesthesia team.  Fluoroscopy was used to localize the surgical level and an incision was marked in a skin crease. The area was then prepped and draped in a sterile fashion. The patient's previous left anterior transverse incision was re-opened. There was significant scar tissue present. The platysma was divided medial to the sternocleidomastoid muscle in a transverse fashion. The carotid sheath was palpated, identified, and retracted laterally with the sternocleidomastoid muscle. The strap muscles were identified and retracted medially and the pretracheal fascia was entered. A bent spinal needle was placed into the disc space and the surgical level was confirmed with fluoroscopy. Since the patient had a complete bony fusion at C5-6 on CT, the C5 and C6 screws were removed so that the anterior plate at K9-3 could be removed.   After removal of the plate,  osteophytes were removed from the C4-5 disc space and a discectomy was performed at C4-5 with bilateral foraminotomies. Once a thorough central canal and foraminal decompression was completed, hemostasis was obtained and a cortical interbody allograft (Medtronic) was sized and then placed into the C4-5 disc space. There was a sizable osteophyte bridging from C4 to C5 that was removed to allow the plate to sit in a lower profile. A new anterior cervical plate (Medtronic) was placed across C4-5 by placing bilateral 6mm screws (Medtronic) into the vertebral bodies of C4 and C5. This was checked with fluoroscopy to again confirm the level as well as screw position.  Hemostasis was obtained and the incision was closed in layers. All instrument and sponge counts were correct. The patient was then returned to anesthesia for emergence. No apparent complications at the completion of the procedure.   EBL:  75mL   DRAINS: none   SPECIMENS: none   Judith Part, MD 08/05/18 12:46 PM

## 2018-08-06 ENCOUNTER — Encounter (HOSPITAL_COMMUNITY): Payer: Self-pay | Admitting: Neurological Surgery

## 2018-08-06 DIAGNOSIS — M4802 Spinal stenosis, cervical region: Secondary | ICD-10-CM | POA: Diagnosis not present

## 2018-08-06 MED ORDER — OXYCODONE HCL 10 MG PO TABS: 10.0000 mg | ORAL_TABLET | ORAL | 0 refills | Status: AC | PRN

## 2018-08-06 NOTE — Progress Notes (Signed)
Physical Therapy Evaluation Patient Details Name: Becky Wolfe MRN: [id number] DOB: Aug 11, 1961 Today's Date: 08/06/2018   History of Present Illness  Pt is a 57 y.o. woman that presented with severe neck pain, arm numbness, arm weakness, and worsening ambulation with poor balance. MRI showed central cervical stenosis. Pt now s/p C4-5 Anterior cervical decompression/discectomy/fusion.  PMH including Anxiety, Bronchitis, Chronic neck pain, Constipation, Depression, DVT (2015), Multiple sclerosis, and TIA.   Clinical Impression  Patient is s/p above surgery resulting in deficits listed below (see PT Problem List). PTA, pt required increased support from spouse to ambulate longer distances, limited by pain severity. At time of evaluation pt performed ambulation with Min A progressing to min G throughout session with RW. Pt educated on cervical precautions, positioning, car transfers, brace adjustments, and overall safety with stair negotiation. Pt reported overall symptom improvement with surgery, but still demonstrated obvious deficits. Recommending RW at d/c for balance support and safety. Acutely, patient will benefit from skilled PT to increase their independence and safety with mobility to allow discharge to the venue listed below.       Follow Up Recommendations No PT follow up;Supervision for mobility/OOB    Equipment Recommendations  Rolling walker with 5" wheels    Recommendations for Other Services       Precautions / Restrictions Precautions Precautions: Cervical Precaution Booklet Issued: Yes (comment) Precaution Comments: Precuations reviewed in full with pt. Pt required cues throughout session for cervical precautions.  Required Braces or Orthoses: (none) Restrictions Weight Bearing Restrictions: No      Mobility  Bed Mobility Overal bed mobility: Needs Assistance Bed Mobility: Rolling;Sidelying to Sit Rolling: Supervision Sidelying to sit: Min guard       General  bed mobility comments: Min guard for inital sit>stand as this was the first time working with pt. Multimodal cues required for log roll sequencing. HOB and Bed rail lowered to simulate home enviornment.   Transfers Overall transfer level: Needs assistance Equipment used: Rolling walker (2 wheeled) Transfers: Sit to/from Stand Sit to Stand: Min assist;Min guard         General transfer comment: Pt initally requiring min A to power up to stand progressing to min G with RW. Min cues for hand placement and controlling descent with hands when lowering to bed.   Ambulation/Gait Ambulation/Gait assistance: Min assist;Min guard Gait Distance (Feet): 300 Feet Assistive device: Rolling walker (2 wheeled);1 person hand held assist Gait Pattern/deviations: Step-through pattern;Trunk flexed Gait velocity: decreased Gait velocity interpretation: <1.31 ft/sec, indicative of household ambulator General Gait Details: Pt very slow and guarded throughout gait initally requiring HHA+1 progressing to min G with use of RW for safety. Pt with slight increase in velocity with RW. Pt required cues throughout for proximity to RW and maintenance of cervical precautions.  Pt reported a headache throughout session.  Stairs Stairs: Yes Stairs assistance: Min guard Stair Management: One rail Right;One rail Left;Step to pattern;Forwards;With cane Number of Stairs: 3 General stair comments: Ascended forwards with stronger LE leading and use of rail on the right and cane on the left. Descended forwards with rail on the left and SPC on the right with weaker LE leading. Cues for cane sequencing and overall technique to maintain cervical precuations and improve safety on stairs.   Wheelchair Mobility    Modified Rankin (Stroke Patients Only)       Balance Overall balance assessment: Needs assistance Sitting-balance support: No upper extremity supported;Feet supported Sitting balance-Leahy Scale: Good  Standing balance support: Bilateral upper extremity supported;During functional activity Standing balance-Leahy Scale: Poor Standing balance comment: Pt did not require UE support for static activies; pt demonstrating improved balance with bil UE support.                              Pertinent Vitals/Pain Pain Assessment: 0-10 Pain Score: 6  Pain Location: Headache and incisional pain Pain Descriptors / Indicators: Aching;Headache;Grimacing;Discomfort;Guarding Pain Intervention(s): Monitored during session;Limited activity within patient's tolerance;Repositioned    Home Living Family/patient expects to be discharged to:: Private residence Living Arrangements: Spouse/significant other Available Help at Discharge: Family;Available 24 hours/day Type of Home: House Home Access: Stairs to enter Entrance Stairs-Rails: Can reach both Entrance Stairs-Number of Steps: 3 Home Layout: One level Home Equipment: Walker - standard;Cane - single point      Prior Function Level of Independence: Independent         Comments: Prior to surgery she utlized husband for assist if needed     Hand Dominance   Dominant Hand: Right    Extremity/Trunk Assessment   Upper Extremity Assessment Upper Extremity Assessment: Overall WFL for tasks assessed    Lower Extremity Assessment Lower Extremity Assessment: Defer to PT evaluation    Cervical / Trunk Assessment Cervical / Trunk Assessment: Other exceptions Cervical / Trunk Exceptions: s/p cervical surgery  Communication   Communication: No difficulties(soft spoken)  Cognition Arousal/Alertness: Awake/alert Behavior During Therapy: WFL for tasks assessed/performed Overall Cognitive Status: Within Functional Limits for tasks assessed                                        General Comments      Exercises     Assessment/Plan    PT Assessment Patient needs continued PT services  PT Problem List Decreased  strength;Decreased range of motion;Decreased balance;Decreased activity tolerance;Decreased mobility;Decreased coordination;Decreased knowledge of use of DME;Decreased safety awareness;Decreased knowledge of precautions;Pain       PT Treatment Interventions DME instruction;Gait training;Stair training;Functional mobility training;Therapeutic activities;Therapeutic exercise;Balance training;Patient/family education;Modalities    PT Goals (Current goals can be found in the Care Plan section)  Acute Rehab PT Goals Patient Stated Goal: to go home today PT Goal Formulation: With patient Time For Goal Achievement: 08/13/18 Potential to Achieve Goals: Good    Frequency Min 5X/week   Barriers to discharge        Co-evaluation               AM-PAC PT "6 Clicks" Daily Activity  Outcome Measure Difficulty turning over in bed (including adjusting bedclothes, sheets and blankets)?: A Little Difficulty moving from lying on back to sitting on the side of the bed? : A Little Difficulty sitting down on and standing up from a chair with arms (e.g., wheelchair, bedside commode, etc,.)?: Unable Help needed moving to and from a bed to chair (including a wheelchair)?: A Little Help needed walking in hospital room?: A Little Help needed climbing 3-5 steps with a railing? : A Little 6 Click Score: 16    End of Session Equipment Utilized During Treatment: Gait belt Activity Tolerance: Patient tolerated treatment well Patient left: in bed;with call bell/phone within reach Nurse Communication: Mobility status;Patient requests pain meds PT Visit Diagnosis: Other abnormalities of gait and mobility (R26.89);Unsteadiness on feet (R26.81);Difficulty in walking, not elsewhere classified (R26.2);Pain Pain - part of body: (cervical)  Time: 1188-6773 PT Time Calculation (min) (ACUTE ONLY): 31 min   Charges:   PT Evaluation $PT Eval Moderate Complexity: 1 Mod PT Treatments $Gait Training: 8-22  mins        Einar Crow, [place]  Student Physical Therapist Acute Rehab [phone]   Einar Crow 08/06/2018, 1:17 PM

## 2018-08-06 NOTE — Progress Notes (Signed)
OT Evaluation:  Clinical Impression: PTA Pt independent in ADL and mobility (PRN assist from husband) Pt is currently min guard for transfers with RW and while mod A for UB dressing, min guard/supervision for other ADL. Cervical handout provided and reviewed adls in detail. Pt educated on: set an alarm at night for medication, avoid sitting for long periods of time, correct bed positioning for sleeping, correct sequence for bed mobility, All education is complete and patient indicates understanding. Thank you for the opportunity to serve this patient.     08/06/18 1100  OT Visit Information  Last OT Received On 08/06/18  Assistance Needed +1  History of Present Illness Pt is a 57 y.o. woman that presented with severe neck pain, arm numbness, arm weakness, and worsening ambulation with poor balance. MRI showed central cervical stenosis. Pt now s/p C4-5 Anterior cervical decompression/discectomy/fusion.  PMH including Anxiety, Bronchitis, Chronic neck pain, Constipation, Depression, DVT (2015), Multiple sclerosis, and TIA.   Precautions  Precautions Cervical  Precaution Booklet Issued Yes (comment)  Precaution Comments Precuations reviewed in full with pt. Pt required cues throughout session for cervical precautions.   Required Braces or Orthoses  (none)  Restrictions  Weight Bearing Restrictions No  Home Living  Family/patient expects to be discharged to: Private residence  Living Arrangements Spouse/significant other;Children (adult sons)  Available Help at Discharge Family;Available 24 hours/day  Type of Home House  Home Access Stairs to enter  Entrance Stairs-Number of Steps 3  Entrance Stairs-Rails Can reach both  Home Layout One level  Bathroom Higher education careers adviser - standard;Cane - single point  Prior Function  Level of Independence Independent  Comments Prior to surgery she utlized husband for assist if needed   Communication  Communication No difficulties (soft spoken)  Pain Assessment  Pain Assessment 0-10  Pain Score 6  Pain Location Headache and incisional pain  Pain Descriptors / Indicators Aching;Headache;Grimacing;Discomfort;Guarding  Pain Intervention(s) Monitored during session;Limited activity within patient's tolerance;Repositioned  Cognition  Arousal/Alertness Awake/alert  Behavior During Therapy WFL for tasks assessed/performed  Overall Cognitive Status Within Functional Limits for tasks assessed  Upper Extremity Assessment  Upper Extremity Assessment Overall WFL for tasks assessed  Lower Extremity Assessment  Lower Extremity Assessment Defer to PT evaluation  Cervical / Trunk Assessment  Cervical / Trunk Assessment Other exceptions  Cervical / Trunk Exceptions s/p cervical surgery  ADL  Overall ADL's  Needs assistance/impaired  Eating/Feeding Independent;Sitting  Grooming Wash/dry hands;Wash/dry face;Oral care;Min guard;Standing;Cueing for compensatory techniques  Grooming Details (indicate cue type and reason) educated on compensatory strategies for grooming (e.g. cup method for oral care)  Upper Body Bathing Set up  Upper Body Bathing Details (indicate cue type and reason) educated on long handle sponge  Lower Body Bathing Set up  Upper Body Dressing  Minimal assistance;Standing;Sitting  Lower Body Dressing Supervision/safety;Sit to/from stand  Lower Body Dressing Details (indicate cue type and reason) able to bring BLE cross to knees for access to feet  Toilet Transfer Min guard;Ambulation;RW  Toileting- Forensic psychologist Min guard;Sit to/from stand  Functional mobility during ADLs Min guard;Rolling walker  General ADL Comments edcuated on compensatory strategies/application of cervical biomechanics with application to ADL  Vision- History  Patient Visual Report No change from baseline  Vision- Assessment  Vision Assessment? No apparent visual deficits   Bed Mobility  Overal bed mobility Needs Assistance  General bed mobility comments Pt sitting at EOB at beginning and end of session  Transfers  Overall transfer level Needs assistance  Equipment used Rolling walker (2 wheeled)  Transfers Sit to/from Stand  Sit to Stand Min guard  General transfer comment vc for safe hand placement with RW, min guard for safety  Balance  Overall balance assessment Needs assistance  Sitting-balance support No upper extremity supported;Feet supported  Sitting balance-Leahy Scale Good  Standing balance support Bilateral upper extremity supported;During functional activity  Standing balance-Leahy Scale Poor  Standing balance comment Pt did not require UE support for static activies; pt demonstrating improved balance with bil UE support.   OT - End of Session  Equipment Utilized During Treatment Rolling walker  Activity Tolerance Patient tolerated treatment well  Patient left in bed;with call bell/phone within reach;with family/visitor present (sitting EOB)  Nurse Communication Mobility status;Precautions  OT Assessment  OT Recommendation/Assessment Patient does not need any further OT services  OT Visit Diagnosis Unsteadiness on feet (R26.81);Pain  Pain - part of body  (neck)  OT Problem List Decreased activity tolerance;Decreased range of motion;Impaired balance (sitting and/or standing);Decreased knowledge of use of DME or AE;Decreased knowledge of precautions;Pain  AM-PAC OT "6 Clicks" Daily Activity Outcome Measure  Help from another person eating meals? 4  Help from another person taking care of personal grooming? 4  Help from another person toileting, which includes using toliet, bedpan, or urinal? 4  Help from another person bathing (including washing, rinsing, drying)? 3  Help from another person to put on and taking off regular upper body clothing? 2  Help from another person to put on and taking off regular lower body clothing? 4  6 Click  Score 21  ADL G Code Conversion CJ  OT Recommendation  Follow Up Recommendations Supervision/Assistance - 24 hour (initially)  OT Equipment None recommended by OT  Acute Rehab OT Goals  Patient Stated Goal to go home today  OT Goal Formulation With patient  Time For Goal Achievement 08/20/18  Potential to Achieve Goals Good  OT Time Calculation  OT Start Time (ACUTE ONLY) 0835  OT Stop Time (ACUTE ONLY) 0900  OT Time Calculation (min) 25 min  OT General Charges  $OT Visit 1 Visit  OT Evaluation  $OT Eval Moderate Complexity 1 Mod  Written Expression  Dominant Hand Right   Hulda Humphrey OTR/L Acute Rehabilitation Services Pager: [phone] Office: [phone]

## 2018-08-06 NOTE — Progress Notes (Signed)
Pt doing well. Pt given D/C instructions with Rx's, verbal understanding was provided. Pt's incision is clean and dry with no sign of infection. Pt's IV was removed prior to D/C. Pt D/C'd home via wheelchair @ 1130 per MD order. Pt is stable @ D/C and has no other needs at this time. Holli Humbles, RN

## 2018-08-06 NOTE — Discharge Instructions (Signed)
Discharge Instructions  Slowly increase your activity back to normal. No restriction in activities except no heavy lifting for 2 weeks. Try not to lift more than 20 pounds. A gallon jug of milk weighs roughly 10 pounds.  Your incision is closed with dermabond (purple glue). This will naturally fall off over the next 1-2 weeks.   Okay to shower on the day of discharge. Be gentle when cleaning your incision. Use regular soap and water. If that is uncomfortable, try using baby shampoo. Do not submerge the wound under water for 2 weeks after surgery.

## 2018-08-06 NOTE — Discharge Summary (Signed)
Discharge Summary  Date of Admission: 08/05/2018  Date of Discharge: 08/06/18  Attending Physician: Emelda Brothers, MD  Hospital Course: Patient was admitted following an uncomplicated N2-7 ACDF. She was recovered in PACU and transferred to the spine recovery center. Her hospital course was uncomplicated and the patient was discharged home on 08/06/18. She will follow up in clinic with me in 2 weeks.  Neurologic exam at discharge:  AOx3, PERRL, EOMI, FS, TM Strength 5/5 and SILT except for baseline RSD in RLE Incision c/d/i  Judith Part, MD 08/06/18 8:44 AM

## 2018-08-07 ENCOUNTER — Encounter (HOSPITAL_COMMUNITY): Payer: Self-pay | Admitting: Neurological Surgery

## 2018-08-10 ENCOUNTER — Encounter (HOSPITAL_COMMUNITY): Payer: Self-pay | Admitting: Neurological Surgery

## 2018-10-16 ENCOUNTER — Ambulatory Visit: Payer: Medicare Other | Admitting: Neurology

## 2018-11-02 ENCOUNTER — Other Ambulatory Visit: Payer: Self-pay | Admitting: Nurse Practitioner

## 2018-11-02 DIAGNOSIS — K7469 Other cirrhosis of liver: Secondary | ICD-10-CM

## 2018-11-18 ENCOUNTER — Encounter (HOSPITAL_COMMUNITY): Payer: Self-pay | Admitting: Neurological Surgery

## 2018-11-27 ENCOUNTER — Ambulatory Visit
Admission: RE | Admit: 2018-11-27 | Discharge: 2018-11-27 | Disposition: A | Discharge status: Home / Self Care | Payer: Medicare Other | Source: Ambulatory Visit | Attending: Nurse Practitioner | Admitting: Nurse Practitioner

## 2018-11-27 DIAGNOSIS — K7469 Other cirrhosis of liver: Secondary | ICD-10-CM

## 2018-12-30 ENCOUNTER — Encounter: Payer: Self-pay | Admitting: Neurology

## 2018-12-30 ENCOUNTER — Ambulatory Visit: Payer: Medicare Other | Admitting: Neurology

## 2018-12-30 ENCOUNTER — Encounter

## 2018-12-30 VITALS — BP 110/80 | HR 68 | Ht 62.0 in | Wt 152.0 lb

## 2018-12-30 DIAGNOSIS — G90529 Complex regional pain syndrome I of unspecified lower limb: Secondary | ICD-10-CM

## 2018-12-30 DIAGNOSIS — Z981 Arthrodesis status: Secondary | ICD-10-CM

## 2018-12-30 DIAGNOSIS — G894 Chronic pain syndrome: Secondary | ICD-10-CM

## 2018-12-30 DIAGNOSIS — G939 Disorder of brain, unspecified: Secondary | ICD-10-CM

## 2018-12-30 DIAGNOSIS — R200 Anesthesia of skin: Secondary | ICD-10-CM

## 2018-12-30 NOTE — Progress Notes (Signed)
[facility]  PATIENT: Becky Wolfe DOB: 59ay 19, 1962  REFERRING DOCTOR OR PCP:  Serita Grammes SOURCE: Patient, notes from primary care, imaging and lab reports, multiple MRI and CT images on PACS personally reviewed  _________________________________   HISTORICAL  CHIEF COMPLAINT:  Chief Complaint  Patient presents with  . Follow-up    RM 13 with husband. Last seen 06/03/18. Had neck surgery in 07/2018. Since, she is still having neck pain. They fused some of her neck, placed plate. Prior to surgery she was having trouble walking, decreased feeling in feet/legs. Day before yesterday she had trouble with her right leg. She will stand up and have no feeling in her leg and foot.     HISTORY OF PRESENT ILLNESS:  Becky Wolfe is a 58 yo woman with neck pain and neurologic issues.  Update 12/30/2018: Afte the last visit, we checked an MRI of the cervical spine showing a large HNP at C4C5 above the prior fusion.   She was referred to [facility].   She also has possible non-union at C7T1 (prior C5-T1 ACDF).   She underwent C4C5 ACDF 08/05/2018.  She has had 3 operations (+/- 2014; 2016; 07/2018 all at [facility])   She reports she had trouble with walking afterwards with leg numbness and clumsiness.   She got better.     A couple nights ago, she started to have more trouble with her right leg.   She had more numbness and clumsiness in it.   She feels she can't move the leg the right leg.     She has done PT.   She has some urinary hesitancy but less than last year.   She also reports problems with swallowing.   She called the Neurosurgery office on Monday after the leg episode.    She also has had episodes where she just passes out and goes to the floor.   She does not know how long she passes out for.       06/04/2018   IMPRESSION: This MRI of the cervical spine without contrast shows the following: 1.     Surgical fusion from C5-T1.  At those levels, there is no foraminal  narrowing or spinal stenosis 2.     At C3-C4, there is a small central disc protrusion that does not indent the thecal sac.  It has progressed since the 2013 MRI. 3.     At C4-C5, there is a large central disc protrusion distorting the thecal sac without causing any signal changes within the spinal cord.  There is mild foraminal narrowing but no nerve root compression.  This level has progressed compared to the 2013 MRI.  06/22/2018  Cervical spine CT: No acute or traumatic finding. Previous ACDF C5-T1. Possible chronic nonunion at C7-T1 with chronic T1 screw fractures. No change from older studies as distant as 2015.    Update 06/03/2018: She feels her neck pain is doing about the same, worse when she looks to the right.   The neck is stiff.   Pain is in the neck and sometimes to the right > left shoulder but not further into the arms.   She often has tingling (like falling asleep) in her arms.   She reports that during her second cervical operation the surgeon couldn't find a bone fragment and that it was embedded in a nerve?   She is not certain of the date of her surgery but reports the surgery was after the 08/2015 MRI.  She is still seeing Dr. Mechele Dawley at [facility] but nerve blocks have not helped much.   She continues to have urinary hesitancy but feels it is not getting any worse.  She has passed out twice in the last month.   She denies any preceding dizziness.lightheadedness.    However, the last time, she felt warm before she passed out and then felt she heard a high pitched tone.      She had LOC 1-2 minutes according to her husband.   She quickly recovered and was able to interact appropriately.  Sh felt tired but not weak or numb afterwards.   When she got home she laid down and rested.    A recent Lumbar MRI showed inflammation in the right L5S1 facet joint with some foraminal narrowing.     She has been diagnosed with RSD in the right leg.  Pain increases with anything  rubbing against it.   Often the leg feels cold to her.   Nerve blocks by Dr. Mechele Dawley have not helped.     For pain she is on levorphanol 2 mg bid, Nucynta 100 mg bid and gabapentin 800 mg tid.     I had previously seen her for an abnormal MRI and concern about MS.  MRI was personally reviewed again today.  She has a stable pattern of white matter changes most consistent with chronic microvascular ischemic change.  Therefore, MS is unlikely.   From 02/19/2018 I had seen her in the past, greater than 3 years ago, for her abnormal brain MRI.    She was diagnosed with MS around 2010 based on abnormal MRI and started on an interferon.   I saw her a couple years later and felt the MRI changes did not look like MS and stopped her medication.   Changes most likely chronic microvascular ischemic changes.  She saw Dr. Moshe Cipro at [facility] for a second opinion and he concurred that MS ws unlikely.  She has an elevated ACE level and sarcoidosis has been considered.   Changes in the brain are not c/w neurosarcoidosis and she may not have sarcoid as no mediastinum lymphadenopathy.  She smoked less than a pack a day for a while but none recently.   She does not have DM.     She does not have HTN in general but did have BP spikes around 2010 or 2011 when hospitalized and receiving ketamine.   She had had a lot of neck pain.   In October 2016, an MRI showed ACDF at C5-C7 and she had s disc herniation at [facility] causing spinal stenosis.      A new muscle relaxant was added and the neck pain is doing a little better.   She notes hr right arm seem stight at times and she also drops items a lot.      She sees Dr. Mechele Dawley at the [facility] for RSD but did not have much benefit from nerve blocks. Pain is in the right leg and followed a leg injury in 2009 (ankle and knee sprain).     She has had difficulty with urinary hesitancy.    She first noted this about the time of her c-spine surgery.   She has seen urology.   She has  never been on Flomax.    She is on Nucynta 100 mg po bid.  She is also on levorphanol.   She saw Dr. Zigmund Daniel [facility]) who started tizanidine.this week.  I personally reviewed the CT scan of the cervical spine dated 14 2015 and the MRI of the cervical spine dated 09/02/2012.   She has fusion at C5-C6 and C6-C7.  At C4-C5 there is a central disc protrusion causing mild to moderate spinal stenosis and mild bilateral foraminal narrowing.  At C5-C6 there is moderate spinal stenosis and mild foraminal narrowing.  C6-C7 looks good with the fusion.  At C7-T1 there is a moderate left paramedian disc protrusion causing mild left foraminal narrowing and mild spinal stenosis.  I also looked at the MRI of the brain from 09/02/2012 and that shows extensive T2/FLAIR hyperintense foci in the hemispheres in a pattern that is most consistent with chronic microvascular ischemic change.  REVIEW OF SYSTEMS: Constitutional: No fevers, chills, sweats, or change in appetite.   SOme fatigue Eyes: No visual changes, double vision, eye pain Ear, nose and throat: No hearing loss, ear pain, nasal congestion, sore throat Cardiovascular: No chest pain, palpitations Respiratory: No shortness of breath at rest or with exertion.   No wheezes GastrointestinaI: No nausea, vomiting, diarrhea, abdominal pain, fecal incontinence Genitourinary:She has hesitancy.   Musculoskeletal: as above  Integumentary: No rash, pruritus, skin lesions Neurological: as above Psychiatric: No depression at this time.  No anxiety Endocrine: No palpitations, diaphoresis, change in appetite, change in weigh or increased thirst Hematologic/Lymphatic: No anemia, purpura, petechiae. Allergic/Immunologic: No itchy/runny eyes, nasal congestion, recent allergic reactions, rashes  ALLERGIES: Allergies  Allergen Reactions  . Lodine [Etodolac] Swelling and Other (See Comments)    Facial swelling   . Ambien [Zolpidem Tartrate] Other (See Comments)     Headaches, memory loss     HOME MEDICATIONS:  Current Outpatient Medications:  .  clonazePAM (KLONOPIN) 1 MG tablet, Take 1 mg by mouth 2 (two) times daily as needed for anxiety. , Disp: , Rfl:  .  cloNIDine (CATAPRES) 0.1 MG tablet, Take 0.1 mg by mouth 2 (two) times daily. , Disp: , Rfl:  .  gabapentin (NEURONTIN) 800 MG tablet, Take 800 mg by mouth 3 (three) times daily., Disp: , Rfl: 1 .  lamoTRIgine (LAMICTAL) 100 MG tablet, Take 1 tablet (100 mg total) by mouth 2 (two) times daily., Disp: 60 tablet, Rfl: 5 .  levorphanol (LEVODROMORAN) 2 MG tablet, Take 2 mg by mouth every 8 (eight) hours as needed for pain. , Disp: , Rfl:  .  NUCYNTA 100 MG TABS, Take 100 mg by mouth 2 (two) times daily., Disp: , Rfl:  .  sertraline (ZOLOFT) 100 MG tablet, Take 100 mg by mouth at bedtime. , Disp: , Rfl:  .  tiZANidine (ZANAFLEX) 2 MG tablet, Take 2 mg by mouth every 8 (eight) hours as needed for muscle spasms. , Disp: , Rfl:   PAST MEDICAL HISTORY: Past Medical History:  Diagnosis Date  . Anxiety 06-16-13   hx. panic attacks, none recent  . Blood transfusion without reported diagnosis    20 yrs ago after childbirth  . Bronchitis    hx of  . Chronic neck pain   . Constipation   . Depression   . DVT (deep venous thrombosis) ([place]) 2015   rt. leg   . Headache(784.0)    migraines-has decreased  . Heart murmur    Dr. Etter Sjogren  . Hepatitis C    dx. Hep. C(s/p transfusion age 62) low Hgb  .-multiple transfusions. Treated successfully  . Multiple sclerosis ([place])    dx. -91yrs ago, then med stopped-due to liver enzymes changes,occ. periods of weakness  in arms"d.  was seen at [facility] and tolded that she does not have MSrops things"  . Pneumonia    hx of , ? mild emphysema on CT scan 5'14, multiple pulmonary nodules  . RSD (reflex sympathetic dystrophy) 06-16-13   legs  . TIA (transient ischemic attack)    hx of (weakness left side)-none in 1 yr    PAST SURGICAL HISTORY: Past Surgical History:   Procedure Laterality Date  . ABDOMINAL HYSTERECTOMY    . ANAL RECTAL MANOMETRY N/A 04/11/2015   Procedure: ANO RECTAL MANOMETRY;  Surgeon: Arta Silence, MD;  Location: [facility];  Service: Endoscopy;  Laterality: N/A;  . ANAL RECTAL MANOMETRY N/A 05/21/2017   Procedure: ANO RECTAL MANOMETRY;  Surgeon: Arta Silence, MD;  Location: [facility];  Service: Endoscopy;  Laterality: N/A;  . ANKLE SURGERY Right    no retained hardware  . ANTERIOR CERVICAL DECOMP/DISCECTOMY FUSION  09/21/2012   Procedure: ANTERIOR CERVICAL DECOMPRESSION/DISCECTOMY FUSION 2 LEVELS;  Surgeon: Otilio Connors, MD;  Location: [place] [facility];  Service: Neurosurgery;  Laterality: N/A;  Cervical five-six, cervical seven-thoracic one Anterior cervical decompression/diskectomy/fusion/Lifenet bone/trestle plate/Prior Cervical six--seven Anterior cervical fusion/Removing trestle plate  . ANTERIOR CERVICAL DECOMP/DISCECTOMY FUSION N/A 08/05/2018   Procedure: Cervical four-five Anterior cervical decompression/discectomy/fusion;  Surgeon: Judith Part, MD;  Location: [place];  Service: Neurosurgery;  Laterality: N/A;  . APPENDECTOMY    . CESAREAN SECTION    . COLONOSCOPY    . ESOPHAGOGASTRODUODENOSCOPY (EGD) WITH PROPOFOL N/A 06/30/2013   Procedure: ESOPHAGOGASTRODUODENOSCOPY (EGD) WITH PROPOFOL;  Surgeon: Arta Silence, MD;  Location: [facility];  Service: Endoscopy;  Laterality: N/A;  . NECK SURGERY    . WEDGE RESECTION     left ovary with cyst removed ,hx. multiple cysts removed prior    FAMILY HISTORY: Family History  Problem Relation Age of Onset  . Heart attack Mother 8       with CABG  . Heart disease Mother   . Heart attack Father   . Heart disease Father   . Clotting disorder Sister        lung cancer  . Lupus Sister   . Diabetes Brother   . Renal Disease Brother   . Diabetes Sister   . Diabetes Sister   . Healthy Sister   . Healthy Sister   . Healthy Sister     SOCIAL HISTORY:  Social  History   Socioeconomic History  . Marital status: Married    Spouse name: Not on file  . Number of children: Not on file  . Years of education: Not on file  . Highest education level: Not on file  Occupational History  . Not on file  Social Needs  . Financial resource strain: Not on file  . Food insecurity:    Worry: Not on file    Inability: Not on file  . Transportation needs:    Medical: Not on file    Non-medical: Not on file  Tobacco Use  . Smoking status: Current Some Day Smoker    Packs/day: 0.12    Years: 36.00    Pack years: 4.32  . Smokeless tobacco: Never Used  . Tobacco comment: 2 - 3 cigs  Substance and Sexual Activity  . Alcohol use: No  . Drug use: No  . Sexual activity: Yes    Birth control/protection: Surgical  Lifestyle  . Physical activity:    Days per week: Not on file    Minutes per session: Not on file  .  Stress: Not on file  Relationships  . Social connections:    Talks on phone: Not on file    Gets together: Not on file    Attends religious service: Not on file    Active member of club or organization: Not on file    Attends meetings of clubs or organizations: Not on file    Relationship status: Not on file  . Intimate partner violence:    Fear of current or ex partner: Not on file    Emotionally abused: Not on file    Physically abused: Not on file    Forced sexual activity: Not on file  Other Topics Concern  . Not on file  Social History Narrative  . Not on file     PHYSICAL EXAM  Vitals:   12/30/18 1128  BP: 110/80  Pulse: 68  SpO2: 98%  Weight: 152 lb (68.9 kg)  Height: 5\' 2"  (1.575 m)    Body mass index is 27.8 kg/m.   General: The patient is well-developed and well-nourished and in no acute distress   Neck:   The neck is mildly tender with a reduced range of motion  Neurologic Exam  Mental status: The patient is alert and oriented x 3 at the time of the examination. The patient has apparent normal recent and  remote memory, with an apparently normal attention span and concentration ability.   Speech is normal.  Cranial nerves: Extraocular movements are full.  Facial strength and sensation is normal.  Trapezius strength is normal.   No obvious hearing deficits are noted.  Motor:  Muscle bulk is normal.   Tone is normal. Strength is  5 / 5 in all 4 extremities except 4 plus/5 triceps on the right  Sensory: She reports reduced sensation to touch and temperature in the right arm relative to the left arm and more so in the right leg relative to the left leg.  Additionally in the trunk there was reduced temperature sensation below the neck on the right relative to the left.  Vibration sensation was less in the right leg relative to the left but the arms were more symmetric.  Coordination: Cerebellar testing showed normal finger-nose-finger.  Gait and station: Station is normal.   Her gait is slightly wide.  She has a reduced stride..   Tandem gait is mildly wide. Romberg is negative.   Reflexes: Deep tendon reflexes are symmetric and normal bilaterally.   Plantar responses are flexor.    DIAGNOSTIC DATA (LABS, IMAGING, TESTING) - I reviewed patient records, labs, notes, testing and imaging myself where available.  Lab Results  Component Value Date   WBC 3.5 (L) 08/05/2018   HGB 13.4 08/05/2018   HCT 39.3 08/05/2018   MCV 87.1 08/05/2018   PLT 176 08/05/2018      Component Value Date/Time   NA 142 08/05/2018 1118   K 4.0 08/05/2018 1118   CL 108 08/05/2018 1118   CO2 23 08/05/2018 1118   GLUCOSE 111 (H) 08/05/2018 1118   BUN 10 08/05/2018 1118   CREATININE 0.79 08/05/2018 1118   CREATININE 0.71 08/08/2011 1600   CALCIUM 9.1 08/05/2018 1118   PROT 7.0 06/22/2018 0953   ALBUMIN 3.9 06/22/2018 0953   AST 22 06/22/2018 0953   ALT 19 06/22/2018 0953   ALKPHOS 85 06/22/2018 0953   BILITOT 0.3 06/22/2018 0953   GFRNONAA >60 08/05/2018 1118   GFRNONAA >60 08/08/2011 1600   GFRAA >60  08/05/2018 1118   GFRAA >60  08/08/2011 1600   Lab Results  Component Value Date   CHOL 129 09/02/2012   HDL 60 09/02/2012   LDLCALC 44 09/02/2012   TRIG 124 09/02/2012   CHOLHDL 2.2 09/02/2012   Lab Results  Component Value Date   HGBA1C 5.6 09/02/2012   No results found for: VITAMINB12 Lab Results  Component Value Date   TSH 0.620 08/08/2011       ASSESSMENT AND PLAN  History of fusion of cervical spine - Plan: MR CERVICAL SPINE WO CONTRAST, DG Cervical Spine With Flex & Extend  Chronic non-specific white matter lesions on MRI  Chronic pain disorder  Complex regional pain syndrome type 1 of lower extremity, unspecified laterality  Numbness - Plan: MR CERVICAL SPINE WO CONTRAST, DG Cervical Spine With Flex & Extend   1.     She is reporting more numbness and has reduced sensation on the right side below her neck.  I need to make sure that she does not have a new disc herniation (had degenerative changes at C3-C4 and make sure that there is no pseudoarthrosis.  The CT scan showed possible nonunion at C7-T1 due to hardware failure.  We will check an MRI of the cervical spine and also check x-rays with flexion and extension.  She is also advised to follow-up with neurosurgery.   2.    Continue gabapentin and lamotrigine for the neurogenic component of her pain.    3.    Stay active and exercise as tolerated.   She will return to see me in 6 months and call sooner if new or worsening neurologic symptoms.   Joson Sapp A. Felecia Shelling, MD, [facility] 2/50/5397, 6:73 PM Certified in Neurology, Clinical Neurophysiology, Sleep Medicine, Pain Medicine and Neuroimaging  [facility] [address], [place], [place] [address] [phone]

## 2018-12-31 ENCOUNTER — Telehealth: Payer: Self-pay | Admitting: Neurology

## 2018-12-31 NOTE — Telephone Encounter (Signed)
[employer] medicare order sent to GI. No auth they will reach out to the pt to schedule.  °

## 2019-01-07 ENCOUNTER — Ambulatory Visit
Admission: RE | Admit: 2019-01-07 | Discharge: 2019-01-07 | Disposition: A | Discharge status: Home / Self Care | Payer: Medicare Other | Source: Ambulatory Visit | Attending: Neurology | Admitting: Neurology

## 2019-01-07 DIAGNOSIS — Z981 Arthrodesis status: Secondary | ICD-10-CM

## 2019-01-07 DIAGNOSIS — R200 Anesthesia of skin: Secondary | ICD-10-CM

## 2019-01-13 ENCOUNTER — Telehealth: Payer: Self-pay | Admitting: *Deleted

## 2019-01-13 NOTE — Telephone Encounter (Signed)
-----   Message from Britt Bottom, MD sent at 01/12/2019  6:17 PM EST ----- Please let her know that the MRI of the cervical spine was the same as the 06/04/2018 MRI except for the extension of the fusion to now include C4-C5 (the most recent surgery).  She no longer has spinal stenosis at C4-C5 like she did with the surgery.   There does not appear to be any nerve root compression.  There is a small disc protrusion C3-C4 that looks the same the previous MRI.

## 2019-01-13 NOTE — Telephone Encounter (Signed)
I called and spoke with pt about MRI results per Dr. Felecia Shelling note. She verbalized understanding. She will also f/u with neurosurgery as discussed at last visit.

## 2019-01-18 ENCOUNTER — Telehealth: Payer: Self-pay | Admitting: Neurology

## 2019-01-18 NOTE — Telephone Encounter (Signed)
Pt has called stating she is aware she is being referred back to her surgeon but she is having numbness on both sides of her body and is having stabbing pain.  Pt is asking if there is anything else Dr Felecia Shelling can do, please call

## 2019-01-19 MED ORDER — METHOCARBAMOL 500 MG PO TABS: 500.0000 mg | ORAL_TABLET | Freq: Three times a day (TID) | ORAL | 1 refills | Status: DC | PRN

## 2019-01-19 NOTE — Telephone Encounter (Signed)
I called pt. Relayed recommendation from Dr. Felecia Shelling.  She is agreeable to try this. I escribed rx.  She has not called neurosurgeon office yet to make appt. I advised her to call them to see if they can schedule appt soon for her to f/u. She verbalized understanding and appreciation.

## 2019-01-19 NOTE — Telephone Encounter (Signed)
Pt called back. She believes she may have a pinched nerve. Again advised for her to follow back up with neurosurgeon.

## 2019-01-19 NOTE — Addendum Note (Signed)
Addended by: Rossie Muskrat L on: 01/19/2019 11:03 AM   Modules accepted: Orders

## 2019-01-19 NOTE — Telephone Encounter (Addendum)
Spoke with Dr. Felecia Shelling. He recommends trying pt on Robaxin 500mg  TID

## 2019-03-14 ENCOUNTER — Emergency Department (HOSPITAL_COMMUNITY): Payer: Medicare Other

## 2019-03-14 ENCOUNTER — Other Ambulatory Visit: Payer: Self-pay

## 2019-03-14 ENCOUNTER — Encounter (HOSPITAL_COMMUNITY): Payer: Self-pay

## 2019-03-14 ENCOUNTER — Emergency Department (HOSPITAL_COMMUNITY)
Admission: EM | Admit: 2019-03-14 | Discharge: 2019-03-14 | Disposition: A | Discharge status: Home / Self Care | Payer: Medicare Other | Attending: Emergency Medicine | Admitting: Emergency Medicine

## 2019-03-14 ENCOUNTER — Emergency Department (HOSPITAL_BASED_OUTPATIENT_CLINIC_OR_DEPARTMENT_OTHER): Payer: Medicare Other

## 2019-03-14 DIAGNOSIS — Z79899 Other long term (current) drug therapy: Secondary | ICD-10-CM | POA: Insufficient documentation

## 2019-03-14 DIAGNOSIS — F17200 Nicotine dependence, unspecified, uncomplicated: Secondary | ICD-10-CM | POA: Diagnosis not present

## 2019-03-14 DIAGNOSIS — M79609 Pain in unspecified limb: Secondary | ICD-10-CM

## 2019-03-14 DIAGNOSIS — R51 Headache: Secondary | ICD-10-CM | POA: Diagnosis not present

## 2019-03-14 DIAGNOSIS — Z86718 Personal history of other venous thrombosis and embolism: Secondary | ICD-10-CM | POA: Diagnosis not present

## 2019-03-14 DIAGNOSIS — R05 Cough: Secondary | ICD-10-CM | POA: Insufficient documentation

## 2019-03-14 DIAGNOSIS — M542 Cervicalgia: Secondary | ICD-10-CM | POA: Insufficient documentation

## 2019-03-14 DIAGNOSIS — N390 Urinary tract infection, site not specified: Secondary | ICD-10-CM | POA: Insufficient documentation

## 2019-03-14 DIAGNOSIS — I2510 Atherosclerotic heart disease of native coronary artery without angina pectoris: Secondary | ICD-10-CM | POA: Diagnosis not present

## 2019-03-14 DIAGNOSIS — M79604 Pain in right leg: Secondary | ICD-10-CM | POA: Diagnosis present

## 2019-03-14 DIAGNOSIS — Z8673 Personal history of transient ischemic attack (TIA), and cerebral infarction without residual deficits: Secondary | ICD-10-CM | POA: Diagnosis not present

## 2019-03-14 LAB — BASIC METABOLIC PANEL
Anion gap: 6 (ref 5–15)
BUN: 12 mg/dL (ref 6–20)
CO2: 26 mmol/L (ref 22–32)
Calcium: 9 mg/dL (ref 8.9–10.3)
Chloride: 111 mmol/L (ref 98–111)
Creatinine, Ser: 0.83 mg/dL (ref 0.44–1.00)
GFR calc Af Amer: >60 mL/min (ref >60)
GFR calc non Af Amer: >60 mL/min (ref >60)
Glucose, Bld: 110 mg/dL — ABNORMAL HIGH (ref 70–99)
Potassium: 3.9 mmol/L (ref 3.5–5.1)
Sodium: 143 mmol/L (ref 135–145)

## 2019-03-14 LAB — CBC WITH DIFFERENTIAL/PLATELET
Abs Immature Granulocytes: 0.01 10*3/uL (ref 0.00–0.07)
Basophils Absolute: 0 10*3/uL (ref 0.0–0.1)
Basophils Relative: 1 %
Eosinophils Absolute: 0.1 10*3/uL (ref 0.0–0.5)
Eosinophils Relative: 2 %
HCT: 38.8 % (ref 36.0–46.0)
Hemoglobin: 13.3 g/dL (ref 12.0–15.0)
Immature Granulocytes: 0 %
Lymphocytes Relative: 57 %
Lymphs Abs: 2.8 10*3/uL (ref 0.7–4.0)
MCH: 30.6 pg (ref 26.0–34.0)
MCHC: 34.3 g/dL (ref 30.0–36.0)
MCV: 89.2 fL (ref 80.0–100.0)
Monocytes Absolute: 0.4 10*3/uL (ref 0.1–1.0)
Monocytes Relative: 7 %
Neutro Abs: 1.6 10*3/uL — ABNORMAL LOW (ref 1.7–7.7)
Neutrophils Relative %: 33 %
Platelets: 196 10*3/uL (ref 150–400)
RBC: 4.35 MIL/uL (ref 3.87–5.11)
RDW: 12.7 % (ref 11.5–15.5)
WBC: 4.9 10*3/uL (ref 4.0–10.5)
nRBC: 0 % (ref 0.0–0.2)

## 2019-03-14 LAB — URINALYSIS, ROUTINE W REFLEX MICROSCOPIC
Bilirubin Urine: NEGATIVE
Glucose, UA: NEGATIVE mg/dL
Hgb urine dipstick: NEGATIVE
Ketones, ur: NEGATIVE mg/dL
Nitrite: NEGATIVE
Protein, ur: NEGATIVE mg/dL
Specific Gravity, Urine: 1.017 (ref 1.005–1.030)
pH: 5 (ref 5.0–8.0)

## 2019-03-14 MED ORDER — SODIUM CHLORIDE (PF) 0.9 % IJ SOLN
INTRAMUSCULAR | Status: AC
  Filled 2019-03-14: qty 50

## 2019-03-14 MED ORDER — ACETAMINOPHEN 325 MG PO TABS
650.0000 mg | ORAL_TABLET | Freq: Once | ORAL | Status: AC
  Administered 2019-03-14: 650 mg via ORAL
  Filled 2019-03-14: qty 2

## 2019-03-14 MED ORDER — IOHEXOL 350 MG/ML SOLN
100.0000 mL | Freq: Once | INTRAVENOUS | Status: AC | PRN
  Administered 2019-03-14: 100 mL via INTRAVENOUS

## 2019-03-14 MED ORDER — CEPHALEXIN 500 MG PO CAPS
500.0000 mg | ORAL_CAPSULE | Freq: Once | ORAL | Status: AC
  Administered 2019-03-14: 21:00:00 500 mg via ORAL
  Filled 2019-03-14: qty 1

## 2019-03-14 MED ORDER — SODIUM CHLORIDE 0.9 % IV SOLN
INTRAVENOUS | Status: DC
  Administered 2019-03-14: 18:00:00 via INTRAVENOUS

## 2019-03-14 MED ORDER — CEPHALEXIN 500 MG PO CAPS: 500.0000 mg | ORAL_CAPSULE | Freq: Three times a day (TID) | ORAL | 0 refills | Status: DC

## 2019-03-14 NOTE — ED Triage Notes (Signed)
Patient c/o chills x 3-4 day. Patient c/o headache, back pain, non productive cough since yesterday and today SOB.

## 2019-03-14 NOTE — Progress Notes (Signed)
VASCULAR LAB PRELIMINARY  PRELIMINARY  PRELIMINARY  PRELIMINARY  Right lower extremity venous dupled completed.    Preliminary report:  There is no DVT or SVT noted in the right lower extremity.  Gave report to Dr. Loney Laurence, [facility], RV[facility] 03/14/2019, 7:41 PM

## 2019-03-14 NOTE — ED Notes (Signed)
Pt in imaging

## 2019-03-14 NOTE — ED Provider Notes (Signed)
[facility] DEPT Provider Note   CSN: [id number] Arrival date & time: 03/14/19  1715    History   Chief Complaint Chief Complaint  Patient presents with  . Headache  . Cough  . Shortness of Breath  . Back Pain  . Chills    HPI Becky Wolfe is a 58 y.o. female.     58 year old female presents with several days of dry cough with subjective chills without recorded temperature.  Has a history of sarcoidosis.  Has also noted some pain to her right leg and does have a prior history of DVT.  Denies any vomiting or diarrhea.  No recent sick exposures.  Has had mild diffuse headache without neck pain or photophobia.  Also notes myalgias mostly in her upper back.  Has not medicated herself with any antipyretics.  Denies any urinary symptoms.  Called her doctor and was told to come here     Past Medical History:  Diagnosis Date  . Anxiety 06-16-13   hx. panic attacks, none recent  . Blood transfusion without reported diagnosis    20 yrs ago after childbirth  . Bronchitis    hx of  . Chronic neck pain   . Constipation   . Depression   . DVT (deep venous thrombosis) ([place]) 2015   rt. leg   . Headache(784.0)    migraines-has decreased  . Heart murmur    Dr. Etter Sjogren  . Hepatitis C    dx. Hep. C(s/p transfusion age 36) low Hgb  .-multiple transfusions. Treated successfully  . Multiple sclerosis ([place])    dx. -62yrs ago, then med stopped-due to liver enzymes changes,occ. periods of weakness in arms"d.  was seen at [facility] and tolded that she does not have MSrops things"  . Pneumonia    hx of , ? mild emphysema on CT scan 5'14, multiple pulmonary nodules  . RSD (reflex sympathetic dystrophy) 06-16-13   legs  . TIA (transient ischemic attack)    hx of (weakness left side)-none in 1 yr    Patient Active Problem List   Diagnosis Date Noted  . Numbness 12/30/2018  . Cervical myelopathy ([place]) 08/05/2018  . Neck pain 02/19/2018  . History of fusion of  cervical spine 02/19/2018  . Urinary hesitancy 02/19/2018  . Chronic non-specific white matter lesions on MRI 02/19/2018  . Accidental overdose 03/07/2016  . Atherosclerosis of native coronary artery of native heart without angina pectoris 02/10/2015  . History of DVT (deep vein thrombosis) 02/10/2015  . Tobacco abuse 02/10/2015  . Right leg pain 04/23/2013  . DVT (deep vein thrombosis) in pregnancy 04/23/2013  . Chronic pain disorder 04/23/2013  . RSD lower limb 04/23/2013  . Chronic anticoagulation 04/23/2013    Past Surgical History:  Procedure Laterality Date  . ABDOMINAL HYSTERECTOMY    . ANAL RECTAL MANOMETRY N/A 04/11/2015   Procedure: ANO RECTAL MANOMETRY;  Surgeon: Arta Silence, MD;  Location: [facility];  Service: Endoscopy;  Laterality: N/A;  . ANAL RECTAL MANOMETRY N/A 05/21/2017   Procedure: ANO RECTAL MANOMETRY;  Surgeon: Arta Silence, MD;  Location: [facility];  Service: Endoscopy;  Laterality: N/A;  . ANKLE SURGERY Right    no retained hardware  . ANTERIOR CERVICAL DECOMP/DISCECTOMY FUSION  09/21/2012   Procedure: ANTERIOR CERVICAL DECOMPRESSION/DISCECTOMY FUSION 2 LEVELS;  Surgeon: Otilio Connors, MD;  Location: [facility]S;  Service: Neurosurgery;  Laterality: N/A;  Cervical five-six, cervical seven-thoracic one Anterior cervical decompression/diskectomy/fusion/Lifenet bone/trestle plate/Prior Cervical six--seven Anterior cervical fusion/Removing  trestle plate  . ANTERIOR CERVICAL DECOMP/DISCECTOMY FUSION N/A 08/05/2018   Procedure: Cervical four-five Anterior cervical decompression/discectomy/fusion;  Surgeon: Judith Part, MD;  Location: [facility];  Service: Neurosurgery;  Laterality: N/A;  . APPENDECTOMY    . CESAREAN SECTION    . COLONOSCOPY    . ESOPHAGOGASTRODUODENOSCOPY (EGD) WITH PROPOFOL N/A 06/30/2013   Procedure: ESOPHAGOGASTRODUODENOSCOPY (EGD) WITH PROPOFOL;  Surgeon: Arta Silence, MD;  [facility];  Service: Endoscopy;  Laterality:  N/A;  . NECK SURGERY    . WEDGE RESECTION     left ovary with cyst removed ,hx. multiple cysts removed prior     OB History   No obstetric history on file.      Home Medications    Prior to Admission medications   Medication Sig Start Date End Date Taking? Authorizing Provider  clonazePAM (KLONOPIN) 1 MG tablet Take 1 mg by mouth 2 (two) times daily as needed for anxiety.     [provider]  cloNIDine (CATAPRES) 0.1 MG tablet Take 0.1 mg by mouth 2 (two) times daily.     [provider]  gabapentin (NEURONTIN) 800 MG tablet Take 800 mg by mouth 3 (three) times daily. 05/12/17   [provider]  lamoTRIgine (LAMICTAL) 100 MG tablet Take 1 tablet (100 mg total) by mouth 2 (two) times daily. 06/03/18   Sater, Nanine Means, MD  levorphanol (LEVODROMORAN) 2 MG tablet Take 2 mg by mouth every 8 (eight) hours as needed for pain.     [provider]  methocarbamol (ROBAXIN) 500 MG tablet Take 1 tablet (500 mg total) by mouth 3 (three) times daily as needed for muscle spasms. 01/19/19   Sater, Nanine Means, MD  NUCYNTA 100 MG TABS Take 100 mg by mouth 2 (two) times daily. 05/15/17   [provider]  sertraline (ZOLOFT) 100 MG tablet Take 100 mg by mouth at bedtime.     [provider]  tiZANidine (ZANAFLEX) 2 MG tablet Take 2 mg by mouth every 8 (eight) hours as needed for muscle spasms.  02/18/18   [provider]    Family History Family History  Problem Relation Age of Onset  . Heart attack Mother 68       with CABG  . Heart disease Mother   . Heart attack Father   . Heart disease Father   . Clotting disorder Sister        lung cancer  . Lupus Sister   . Diabetes Brother   . Renal Disease Brother   . Diabetes Sister   . Diabetes Sister   . Healthy Sister   . Healthy Sister   . Healthy Sister     Social History Social History   Tobacco Use  . Smoking status: Current Some Day Smoker    Packs/day: 0.12    Years: 36.00     Pack years: 4.32  . Smokeless tobacco: Never Used  . Tobacco comment: 2 - 3 cigs  Substance Use Topics  . Alcohol use: No  . Drug use: No     Allergies   Lodine [etodolac] and Ambien [zolpidem tartrate]   Review of Systems Review of Systems  All other systems reviewed and are negative.    Physical Exam Updated Vital Signs BP (!) 142/71 (BP Location: Right Arm)   Pulse 70   Temp 98.9 F (37.2 C) (Oral)   Resp 16   Ht 1.575 m (5\' 2" )   Wt 65.8 kg   LMP 10/16/2011  SpO2 100%   BMI 26.52 kg/m   Physical Exam Vitals signs and nursing note reviewed.  Constitutional:      General: She is not in acute distress.    Appearance: Normal appearance. She is well-developed. She is not toxic-appearing.  HENT:     Head: Normocephalic and atraumatic.  Eyes:     General: Lids are normal.     Conjunctiva/sclera: Conjunctivae normal.     Pupils: Pupils are equal, round, and reactive to light.  Neck:     Musculoskeletal: Normal range of motion and neck supple.     Thyroid: No thyroid mass.     Trachea: No tracheal deviation.  Cardiovascular:     Rate and Rhythm: Normal rate and regular rhythm.     Heart sounds: Normal heart sounds. No murmur. No gallop.   Pulmonary:     Effort: Pulmonary effort is normal. No respiratory distress.     Breath sounds: No stridor. Examination of the right-upper field reveals decreased breath sounds. Examination of the left-upper field reveals decreased breath sounds. Decreased breath sounds present. No wheezing, rhonchi or rales.  Abdominal:     General: Bowel sounds are normal. There is no distension.     Palpations: Abdomen is soft.     Tenderness: There is no abdominal tenderness. There is no rebound.  Musculoskeletal: Normal range of motion.        General: No tenderness.  Skin:    General: Skin is warm and dry.     Findings: No abrasion or rash.  Neurological:     Mental Status: She is alert and oriented to person, place, and time.      GCS: GCS eye subscore is 4. GCS verbal subscore is 5. GCS motor subscore is 6.     Cranial Nerves: No cranial nerve deficit.     Sensory: No sensory deficit.  Psychiatric:        Speech: Speech normal.        Behavior: Behavior normal.      ED Treatments / Results  Labs (all labs ordered are listed, but only abnormal results are displayed) Labs Reviewed  CBC WITH DIFFERENTIAL/PLATELET  BASIC METABOLIC PANEL    EKG None  Radiology No results found.  Procedures Procedures (including critical care time)  Medications Ordered in ED Medications  0.9 %  sodium chloride infusion (has no administration in time range)     Initial Impression / Assessment and Plan / ED Course  I have reviewed the triage vital signs and the nursing notes.  Pertinent labs & imaging results that were available during my care of the patient were reviewed by me and considered in my medical decision making (see chart for details).        Lower extremity Doppler of right leg was negative for DVT.  Chest CT negative for PE.  Urinalysis does show infection will be given a oral dose of antibiotics here as well as prescription for same.  CBC without evidence of leukocytosis.  Will discharge home  Final Clinical Impressions(s) / ED Diagnoses   Final diagnoses:  None    ED Discharge Orders    None       Lacretia Leigh, MD 03/14/19 2017

## 2019-03-15 LAB — URINE CULTURE

## 2019-05-12 ENCOUNTER — Other Ambulatory Visit: Payer: Self-pay

## 2019-05-12 ENCOUNTER — Encounter (HOSPITAL_BASED_OUTPATIENT_CLINIC_OR_DEPARTMENT_OTHER): Payer: Self-pay

## 2019-05-12 ENCOUNTER — Emergency Department (HOSPITAL_BASED_OUTPATIENT_CLINIC_OR_DEPARTMENT_OTHER)
Admission: EM | Admit: 2019-05-12 | Discharge: 2019-05-12 | Disposition: A | Discharge status: Home / Self Care | Payer: Medicare Other | Attending: Emergency Medicine | Admitting: Emergency Medicine

## 2019-05-12 ENCOUNTER — Emergency Department (HOSPITAL_BASED_OUTPATIENT_CLINIC_OR_DEPARTMENT_OTHER): Payer: Medicare Other

## 2019-05-12 DIAGNOSIS — S92512A Displaced fracture of proximal phalanx of left lesser toe(s), initial encounter for closed fracture: Secondary | ICD-10-CM | POA: Insufficient documentation

## 2019-05-12 DIAGNOSIS — W51XXXA Accidental striking against or bumped into by another person, initial encounter: Secondary | ICD-10-CM | POA: Diagnosis not present

## 2019-05-12 DIAGNOSIS — G35 Multiple sclerosis: Secondary | ICD-10-CM | POA: Diagnosis not present

## 2019-05-12 DIAGNOSIS — Y929 Unspecified place or not applicable: Secondary | ICD-10-CM | POA: Diagnosis not present

## 2019-05-12 DIAGNOSIS — Y9383 Activity, rough housing and horseplay: Secondary | ICD-10-CM | POA: Diagnosis not present

## 2019-05-12 DIAGNOSIS — Z79899 Other long term (current) drug therapy: Secondary | ICD-10-CM | POA: Insufficient documentation

## 2019-05-12 DIAGNOSIS — Z8673 Personal history of transient ischemic attack (TIA), and cerebral infarction without residual deficits: Secondary | ICD-10-CM | POA: Insufficient documentation

## 2019-05-12 DIAGNOSIS — F17200 Nicotine dependence, unspecified, uncomplicated: Secondary | ICD-10-CM | POA: Insufficient documentation

## 2019-05-12 DIAGNOSIS — S99922A Unspecified injury of left foot, initial encounter: Secondary | ICD-10-CM | POA: Diagnosis present

## 2019-05-12 DIAGNOSIS — Y999 Unspecified external cause status: Secondary | ICD-10-CM | POA: Diagnosis not present

## 2019-05-12 NOTE — Discharge Instructions (Signed)
Please read and follow all provided instructions.  Your diagnoses today include:  1. Closed displaced fracture of proximal phalanx of lesser toe of left foot, initial encounter     Tests performed today include: An x-ray of the affected area - shows broken 5th proximal phalanx Vital signs. See below for your results today.   Medications prescribed:   Take any prescribed medications only as directed.  Home care instructions:  Follow any educational materials contained in this packet Follow R.I.C.E. Protocol: R - rest your injury  I  - use ice on injury without applying directly to skin C - compress injury with bandage or splint E - elevate the injury as much as possible  Follow-up instructions: Please follow-up with your primary care provider or the provided orthopedic physician (bone specialist) if you continue to have significant pain in 1 week. In this case you may have a more severe injury that requires further care.   Return instructions:  Please return if your toes or feet are numb or tingling, appear gray or blue, or you have severe pain (also elevate the leg and loosen splint or wrap if you were given one) Please return to the Emergency Department if you experience worsening symptoms.  Please return if you have any other emergent concerns.  Additional Information:  Your vital signs today were: BP (!) 148/74 (BP Location: Left Arm)    Pulse 68    Temp 98.9 F (37.2 C) (Oral)    Resp 18    Ht 5\' 2"  (1.575 m)    Wt 66.7 kg    LMP 10/16/2011    SpO2 100%    BMI 26.89 kg/m  If your blood pressure (BP) was elevated above 135/85 this visit, please have this repeated by your doctor within one month. -------------- If prescribed crutches for your injury: use crutches with non-weight bearing for the first few days. Then, you may walk as the pain allows, or as instructed. Start gradually with weight bearing on the affected side. Once you can walk pain free, then try jogging. When  you can run forwards, then you can try moving side-to-side. If you cannot walk without crutches in one week, you need a re-check. --------------

## 2019-05-12 NOTE — ED Triage Notes (Signed)
pt injured left pinky toe yesterday-NAD-steady gait

## 2019-05-12 NOTE — ED Provider Notes (Signed)
[facility] EMERGENCY DEPARTMENT Provider Note   CSN: [id number] Arrival date & time: 05/12/19  1249     History   Chief Complaint Chief Complaint  Patient presents with  . Toe Injury    HPI Becky Wolfe is a 58 y.o. female.     Patient with history of RSD of legs presents the emergency department with acute onset of left little toe pain.  Patient states that she was horsing around with her son who is 88 years old and kicked yesterday.  She sustained pain and bruising to the base of the toe.  Pain is worse with palpation and movement.  Patient states that she went to a free COVID testing site today and someone there told her that she should really have her foot looked at.  She has pain medication that she uses at home but has not taken any today.  Course is constant.     Past Medical History:  Diagnosis Date  . Anxiety 06-16-13   hx. panic attacks, none recent  . Blood transfusion without reported diagnosis    20 yrs ago after childbirth  . Bronchitis    hx of  . Chronic neck pain   . Constipation   . Depression   . DVT (deep venous thrombosis) ([place]) 2015   rt. leg   . Headache(784.0)    migraines-has decreased  . Heart murmur    Dr. Etter Sjogren  . Hepatitis C    dx. Hep. C(s/p transfusion age 33) low Hgb  .-multiple transfusions. Treated successfully  . Multiple sclerosis ([place])    dx. -14yrs ago, then med stopped-due to liver enzymes changes,occ. periods of weakness in arms"d.  was seen at [facility] and tolded that she does not have MSrops things"  . Pneumonia    hx of , ? mild emphysema on CT scan 5'14, multiple pulmonary nodules  . RSD (reflex sympathetic dystrophy) 06-16-13   legs  . TIA (transient ischemic attack)    hx of (weakness left side)-none in 1 yr    Patient Active Problem List   Diagnosis Date Noted  . Numbness 12/30/2018  . Cervical myelopathy ([place]) 08/05/2018  . Neck pain 02/19/2018  . History of fusion of cervical spine 02/19/2018  .  Urinary hesitancy 02/19/2018  . Chronic non-specific white matter lesions on MRI 02/19/2018  . Accidental overdose 03/07/2016  . Atherosclerosis of native coronary artery of native heart without angina pectoris 02/10/2015  . History of DVT (deep vein thrombosis) 02/10/2015  . Tobacco abuse 02/10/2015  . Right leg pain 04/23/2013  . DVT (deep vein thrombosis) in pregnancy 04/23/2013  . Chronic pain disorder 04/23/2013  . RSD lower limb 04/23/2013  . Chronic anticoagulation 04/23/2013    Past Surgical History:  Procedure Laterality Date  . ABDOMINAL HYSTERECTOMY    . ANAL RECTAL MANOMETRY N/A 04/11/2015   Procedure: ANO RECTAL MANOMETRY;  Surgeon: Arta Silence, MD;  [facility];  Service: Endoscopy;  Laterality: N/A;  . ANAL RECTAL MANOMETRY N/A 05/21/2017   Procedure: ANO RECTAL MANOMETRY;  Surgeon: Arta Silence, MD;  [facility];  Service: Endoscopy;  Laterality: N/A;  . ANKLE SURGERY Right    no retained hardware  . ANTERIOR CERVICAL DECOMP/DISCECTOMY FUSION  09/21/2012   Procedure: ANTERIOR CERVICAL DECOMPRESSION/DISCECTOMY FUSION 2 LEVELS;  Surgeon: Otilio Connors, MD;  Location: [place] [facility];  Service: Neurosurgery;  Laterality: N/A;  Cervical five-six, cervical seven-thoracic one Anterior cervical decompression/diskectomy/fusion/Lifenet bone/trestle plate/Prior Cervical six--seven Anterior cervical fusion/Removing trestle  plate  . ANTERIOR CERVICAL DECOMP/DISCECTOMY FUSION N/A 08/05/2018   Procedure: Cervical four-five Anterior cervical decompression/discectomy/fusion;  Surgeon: Judith Part, MD;  Location: [facility];  Service: Neurosurgery;  Laterality: N/A;  . APPENDECTOMY    . CESAREAN SECTION    . COLONOSCOPY    . ESOPHAGOGASTRODUODENOSCOPY (EGD) WITH PROPOFOL N/A 06/30/2013   Procedure: ESOPHAGOGASTRODUODENOSCOPY (EGD) WITH PROPOFOL;  Surgeon: Arta Silence, MD;  [facility];  Service: Endoscopy;  Laterality: N/A;  . NECK SURGERY    .  WEDGE RESECTION     left ovary with cyst removed ,hx. multiple cysts removed prior     OB History   No obstetric history on file.      Home Medications    Prior to Admission medications   Medication Sig Start Date End Date Taking? Authorizing Provider  cephALEXin (KEFLEX) 500 MG capsule Take 1 capsule (500 mg total) by mouth 3 (three) times daily. 03/14/19   Lacretia Leigh, MD  clonazePAM (KLONOPIN) 1 MG tablet Take 1 mg by mouth 2 (two) times daily as needed for anxiety.     [provider]  cloNIDine (CATAPRES) 0.1 MG tablet Take 0.1 mg by mouth 2 (two) times daily.     [provider]  gabapentin (NEURONTIN) 800 MG tablet Take 800 mg by mouth 3 (three) times daily. 05/12/17   [provider]  lamoTRIgine (LAMICTAL) 100 MG tablet Take 1 tablet (100 mg total) by mouth 2 (two) times daily. 06/03/18   Sater, Nanine Means, MD  levorphanol (LEVODROMORAN) 2 MG tablet Take 2 mg by mouth every 8 (eight) hours as needed for pain.     [provider]  methocarbamol (ROBAXIN) 500 MG tablet Take 1 tablet (500 mg total) by mouth 3 (three) times daily as needed for muscle spasms. 01/19/19   Sater, Nanine Means, MD  NUCYNTA 100 MG TABS Take 100 mg by mouth 2 (two) times daily. 05/15/17   [provider]  sertraline (ZOLOFT) 100 MG tablet Take 100 mg by mouth at bedtime.     [provider]  tiZANidine (ZANAFLEX) 2 MG tablet Take 2 mg by mouth every 8 (eight) hours as needed for muscle spasms.  02/18/18   [provider]    Family History Family History  Problem Relation Age of Onset  . Heart attack Mother 69       with CABG  . Heart disease Mother   . Heart attack Father   . Heart disease Father   . Clotting disorder Sister        lung cancer  . Lupus Sister   . Diabetes Brother   . Renal Disease Brother   . Diabetes Sister   . Diabetes Sister   . Healthy Sister   . Healthy Sister   . Healthy Sister     Social History Social History    Tobacco Use  . Smoking status: Current Some Day Smoker    Packs/day: 0.12    Years: 36.00    Pack years: 4.32  . Smokeless tobacco: Never Used  . Tobacco comment: 2 - 3 cigs  Substance Use Topics  . Alcohol use: No  . Drug use: No     Allergies   Lodine [etodolac] and Ambien [zolpidem tartrate]   Review of Systems Review of Systems  Constitutional: Negative for activity change.  Musculoskeletal: Positive for arthralgias and gait problem (due to toe pain). Negative for back pain, joint swelling and neck pain.  Skin: Positive for color  change. Negative for wound.  Neurological: Negative for weakness and numbness.     Physical Exam Updated Vital Signs BP (!) 148/74 (BP Location: Left Arm)   Pulse 68   Temp 98.9 F (37.2 C) (Oral)   Resp 18   Ht 5\' 2"  (1.575 m)   Wt 66.7 kg   LMP 10/16/2011   SpO2 100%   BMI 26.89 kg/m   Physical Exam Vitals signs and nursing note reviewed.  Constitutional:      Appearance: She is well-developed.  HENT:     Head: Normocephalic and atraumatic.  Eyes:     Pupils: Pupils are equal, round, and reactive to light.  Neck:     Musculoskeletal: Normal range of motion and neck supple.  Cardiovascular:     Pulses: Normal pulses. No decreased pulses.  Musculoskeletal:        General: Tenderness present.       Feet:  Skin:    General: Skin is warm and dry.  Neurological:     Mental Status: She is alert.     Sensory: No sensory deficit.     Comments: Motor, sensation, and vascular distal to the injury is fully intact.       ED Treatments / Results  Labs (all labs ordered are listed, but only abnormal results are displayed) Labs Reviewed - No data to display  EKG None  Radiology Dg Toe 5th Left  Result Date: 05/12/2019 CLINICAL DATA:  Pain EXAM: DG TOE 5TH LEFT COMPARISON:  None. FINDINGS: There is an acute mildly displaced fracture involving the proximal phalanx of the fifth digit. There is surrounding soft tissue swelling.  IMPRESSION: Acute mildly displaced fracture involving the proximal phalanx of the fifth digit. Electronically Signed   By: Constance Holster M.D.   On: 05/12/2019 13:30    Procedures Procedures (including critical care time)  Medications Ordered in ED Medications - No data to display   Initial Impression / Assessment and Plan / ED Course  I have reviewed the triage vital signs and the nursing notes.  Pertinent labs & imaging results that were available during my care of the patient were reviewed by me and considered in my medical decision making (see chart for details).        Patient seen and examined.  Awaiting x-rays.  Discussed use of home pain medications and rice protocol.  Vital signs reviewed and are as follows: BP (!) 148/74 (BP Location: Left Arm)   Pulse 68   Temp 98.9 F (37.2 C) (Oral)   Resp 18   Ht 5\' 2"  (1.575 m)   Wt 66.7 kg   LMP 10/16/2011   SpO2 100%   BMI 26.89 kg/m   1:35 PM X-ray images personally reviewed and interpreted.    Proximal phalanx fracture noted.   Plan: Buddy tape, home meds, RICE.   Encourage PCP follow-up as needed.  Final Clinical Impressions(s) / ED Diagnoses   Final diagnoses:  Closed displaced fracture of proximal phalanx of lesser toe of left foot, initial encounter   Toe fracture, closed, conservative mgmt.   ED Discharge Orders    None       Carlisle Cater, Hershal Coria 05/12/19 1338    Maudie Flakes, MD 05/14/19 [phone]

## 2019-05-13 ENCOUNTER — Other Ambulatory Visit: Payer: Self-pay | Admitting: Nurse Practitioner

## 2019-05-13 DIAGNOSIS — K7469 Other cirrhosis of liver: Secondary | ICD-10-CM

## 2019-05-26 ENCOUNTER — Other Ambulatory Visit: Payer: Medicare Other

## 2019-06-02 ENCOUNTER — Other Ambulatory Visit: Payer: Medicare Other

## 2019-06-09 ENCOUNTER — Ambulatory Visit
Admission: RE | Admit: 2019-06-09 | Discharge: 2019-06-09 | Disposition: A | Discharge status: Home / Self Care | Payer: Medicare Other | Source: Ambulatory Visit | Attending: Nurse Practitioner | Admitting: Nurse Practitioner

## 2019-06-09 DIAGNOSIS — K7469 Other cirrhosis of liver: Secondary | ICD-10-CM

## 2019-10-19 ENCOUNTER — Other Ambulatory Visit: Payer: Self-pay | Admitting: Nurse Practitioner

## 2019-10-19 DIAGNOSIS — K7469 Other cirrhosis of liver: Secondary | ICD-10-CM

## 2019-11-10 ENCOUNTER — Other Ambulatory Visit: Payer: Medicare Other

## 2019-11-16 ENCOUNTER — Other Ambulatory Visit: Payer: Medicare Other

## 2019-11-24 ENCOUNTER — Ambulatory Visit
Admission: RE | Admit: 2019-11-24 | Discharge: 2019-11-24 | Disposition: A | Discharge status: Home / Self Care | Payer: Medicare Other | Source: Ambulatory Visit | Attending: Nurse Practitioner | Admitting: Nurse Practitioner

## 2019-11-24 DIAGNOSIS — K7469 Other cirrhosis of liver: Secondary | ICD-10-CM

## 2020-01-05 ENCOUNTER — Other Ambulatory Visit: Payer: Self-pay | Admitting: Family Medicine

## 2020-01-05 ENCOUNTER — Ambulatory Visit
Admission: RE | Admit: 2020-01-05 | Discharge: 2020-01-05 | Disposition: A | Discharge status: Home / Self Care | Payer: Medicare Other | Source: Ambulatory Visit | Attending: Family Medicine | Admitting: Family Medicine

## 2020-01-05 DIAGNOSIS — R0789 Other chest pain: Secondary | ICD-10-CM

## 2020-01-08 ENCOUNTER — Other Ambulatory Visit: Payer: Self-pay

## 2020-01-08 ENCOUNTER — Emergency Department (HOSPITAL_COMMUNITY)
Admission: EM | Admit: 2020-01-08 | Discharge: 2020-01-09 | Disposition: A | Discharge status: Home / Self Care | Payer: Medicare Other | Attending: Emergency Medicine | Admitting: Emergency Medicine

## 2020-01-08 ENCOUNTER — Encounter (HOSPITAL_COMMUNITY): Payer: Self-pay | Admitting: Emergency Medicine

## 2020-01-08 DIAGNOSIS — F17200 Nicotine dependence, unspecified, uncomplicated: Secondary | ICD-10-CM | POA: Insufficient documentation

## 2020-01-08 DIAGNOSIS — Z79899 Other long term (current) drug therapy: Secondary | ICD-10-CM | POA: Insufficient documentation

## 2020-01-08 DIAGNOSIS — G35 Multiple sclerosis: Secondary | ICD-10-CM | POA: Insufficient documentation

## 2020-01-08 DIAGNOSIS — U071 COVID-19: Secondary | ICD-10-CM | POA: Diagnosis not present

## 2020-01-08 DIAGNOSIS — Z8673 Personal history of transient ischemic attack (TIA), and cerebral infarction without residual deficits: Secondary | ICD-10-CM | POA: Insufficient documentation

## 2020-01-08 DIAGNOSIS — I2510 Atherosclerotic heart disease of native coronary artery without angina pectoris: Secondary | ICD-10-CM | POA: Insufficient documentation

## 2020-01-08 DIAGNOSIS — M7918 Myalgia, other site: Secondary | ICD-10-CM | POA: Diagnosis present

## 2020-01-08 DIAGNOSIS — R0602 Shortness of breath: Secondary | ICD-10-CM

## 2020-01-08 NOTE — ED Triage Notes (Addendum)
Patient tested positive for Covid19 at her PCP clinic yesterday , reports SOB , dry cough and chest congestion today , denies fever or chills .

## 2020-01-09 ENCOUNTER — Emergency Department (HOSPITAL_COMMUNITY): Payer: Medicare Other

## 2020-01-09 LAB — CBC WITH DIFFERENTIAL/PLATELET
Abs Immature Granulocytes: 0 10*3/uL (ref 0.00–0.07)
Basophils Absolute: 0 10*3/uL (ref 0.0–0.1)
Basophils Relative: 0 %
Eosinophils Absolute: 0.1 10*3/uL (ref 0.0–0.5)
Eosinophils Relative: 4 %
HCT: 39.8 % (ref 36.0–46.0)
Hemoglobin: 13.6 g/dL (ref 12.0–15.0)
Immature Granulocytes: 0 %
Lymphocytes Relative: 60 %
Lymphs Abs: 1.7 10*3/uL (ref 0.7–4.0)
MCH: 29.8 pg (ref 26.0–34.0)
MCHC: 34.2 g/dL (ref 30.0–36.0)
MCV: 87.3 fL (ref 80.0–100.0)
Monocytes Absolute: 0.2 10*3/uL (ref 0.1–1.0)
Monocytes Relative: 6 %
Neutro Abs: 0.9 10*3/uL — ABNORMAL LOW (ref 1.7–7.7)
Neutrophils Relative %: 30 %
Platelets: 175 10*3/uL (ref 150–400)
RBC: 4.56 MIL/uL (ref 3.87–5.11)
RDW: 12 % (ref 11.5–15.5)
Smear Review: ADEQUATE
WBC: 2.8 10*3/uL — ABNORMAL LOW (ref 4.0–10.5)
nRBC: 0 % (ref 0.0–0.2)

## 2020-01-09 LAB — COMPREHENSIVE METABOLIC PANEL
ALT: 19 U/L (ref 0–44)
AST: 21 U/L (ref 15–41)
Albumin: 3.8 g/dL (ref 3.5–5.0)
Alkaline Phosphatase: 81 U/L (ref 38–126)
Anion gap: 8 (ref 5–15)
BUN: 11 mg/dL (ref 6–20)
CO2: 26 mmol/L (ref 22–32)
Calcium: 8.9 mg/dL (ref 8.9–10.3)
Chloride: 106 mmol/L (ref 98–111)
Creatinine, Ser: 0.8 mg/dL (ref 0.44–1.00)
GFR calc Af Amer: >60 mL/min (ref >60)
GFR calc non Af Amer: >60 mL/min (ref >60)
Glucose, Bld: 101 mg/dL — ABNORMAL HIGH (ref 70–99)
Potassium: 3.9 mmol/L (ref 3.5–5.1)
Sodium: 140 mmol/L (ref 135–145)
Total Bilirubin: 0.5 mg/dL (ref 0.3–1.2)
Total Protein: 6.7 g/dL (ref 6.5–8.1)

## 2020-01-09 LAB — I-STAT BETA HCG BLOOD, ED (MC, WL, AP ONLY): I-stat hCG, quantitative: <5 m[IU]/mL (ref <5)

## 2020-01-09 MED ORDER — KETOROLAC TROMETHAMINE 15 MG/ML IJ SOLN
15.0000 mg | Freq: Once | INTRAMUSCULAR | Status: AC
  Administered 2020-01-09: 15 mg via INTRAVENOUS
  Filled 2020-01-09: qty 1

## 2020-01-09 NOTE — ED Notes (Signed)
Patient verbalizes understanding of discharge instructions. Opportunity for questioning and answers were provided. Armband removed by staff, pt discharged from ED. Pt. ambulatory and discharged home.  

## 2020-01-09 NOTE — Discharge Instructions (Addendum)
You were seen today for shortness of breath.  Your work-up is reassuring.  Your chest x-ray is negative.  Your oxygen saturations were greater than 94% during your stay in the emergency room.  Continue to monitor closely at home.  Given that you are early in the illness, you could worsen.  If you develop worsening shortness of breath, chest pain and note that your O2 sats drop less than 90% after allowing the pulse oximeter to equilibrate, you need to be reevaluated.  You may receive a phone call from the infusion center if you qualify for monoclonal antibody therapy.

## 2020-01-09 NOTE — ED Notes (Signed)
Per Pt, she is here today because for the past couple days she has been experiencing SHOB. She tested positive for COVID yesterday. Today her [facility] has increased. She states she bought an at home Spo2 reader, and it showed 85%. Her family was concerned and sent her here to be evaluated. She is on pulse ox monitoring and Spo2 reads 99% on RA. No signs of respiratory distress at this time.

## 2020-01-09 NOTE — ED Provider Notes (Signed)
[facility] EMERGENCY DEPARTMENT Provider Note   CSN: [id number] Arrival date & time: 01/08/20  2331     History Chief Complaint  Patient presents with  . COVID+ / SOB/Cough    Becky Wolfe is a 59 y.o. female.  HPI     This is a 59 year old female with a history of DVT, hepatitis C, multiple sclerosis, RSD who presents with body aches and shortness of breath.  Patient was diagnosed with COVID-19 yesterday.  She states she is on day 3 of symptoms.  She reports generalized body aches and backache.  She also reports shortness of breath.  No cough.  Denies chest pain, nausea, vomiting, diarrhea.  She did take ibuprofen at home with minimal relief.  She reports her shortness of breath increased today.  She noted her O2 sats to 85% on home pulse ox.  She is a non-smoker.  No history of COPD.  Past Medical History:  Diagnosis Date  . Anxiety 06-16-13   hx. panic attacks, none recent  . Blood transfusion without reported diagnosis    20 yrs ago after childbirth  . Bronchitis    hx of  . Chronic neck pain   . Constipation   . Depression   . DVT (deep venous thrombosis) ([place]) 2015   rt. leg   . Headache(784.0)    migraines-has decreased  . Heart murmur    Dr. Etter Sjogren  . Hepatitis C    dx. Hep. C(s/p transfusion age 56) low Hgb  .-multiple transfusions. Treated successfully  . Multiple sclerosis ([place])    dx. -15yrs ago, then med stopped-due to liver enzymes changes,occ. periods of weakness in arms"d.  was seen at [facility] and tolded that she does not have MSrops things"  . Pneumonia    hx of , ? mild emphysema on CT scan 5'14, multiple pulmonary nodules  . RSD (reflex sympathetic dystrophy) 06-16-13   legs  . TIA (transient ischemic attack)    hx of (weakness left side)-none in 1 yr    Patient Active Problem List   Diagnosis Date Noted  . Numbness 12/30/2018  . Cervical myelopathy ([place]) 08/05/2018  . Neck pain 02/19/2018  . History of fusion of cervical  spine 02/19/2018  . Urinary hesitancy 02/19/2018  . Chronic non-specific white matter lesions on MRI 02/19/2018  . Accidental overdose 03/07/2016  . Atherosclerosis of native coronary artery of native heart without angina pectoris 02/10/2015  . History of DVT (deep vein thrombosis) 02/10/2015  . Tobacco abuse 02/10/2015  . Right leg pain 04/23/2013  . DVT (deep vein thrombosis) in pregnancy 04/23/2013  . Chronic pain disorder 04/23/2013  . RSD lower limb 04/23/2013  . Chronic anticoagulation 04/23/2013    Past Surgical History:  Procedure Laterality Date  . ABDOMINAL HYSTERECTOMY    . ANAL RECTAL MANOMETRY N/A 04/11/2015   Procedure: ANO RECTAL MANOMETRY;  Surgeon: Arta Silence, MD;  [facility];  Service: Endoscopy;  Laterality: N/A;  . ANAL RECTAL MANOMETRY N/A 05/21/2017   Procedure: ANO RECTAL MANOMETRY;  Surgeon: Arta Silence, MD;  [facility];  Service: Endoscopy;  Laterality: N/A;  . ANKLE SURGERY Right    no retained hardware  . ANTERIOR CERVICAL DECOMP/DISCECTOMY FUSION  09/21/2012   Procedure: ANTERIOR CERVICAL DECOMPRESSION/DISCECTOMY FUSION 2 LEVELS;  Surgeon: Otilio Connors, MD;  Location: [place] NEURO ORS;  Service: Neurosurgery;  Laterality: N/A;  Cervical five-six, cervical seven-thoracic one Anterior cervical decompression/diskectomy/fusion/Lifenet bone/trestle plate/Prior Cervical six--seven Anterior cervical fusion/Removing trestle plate  .  ANTERIOR CERVICAL DECOMP/DISCECTOMY FUSION N/A 08/05/2018   Procedure: Cervical four-five Anterior cervical decompression/discectomy/fusion;  Surgeon: Judith Part, MD;  Location: [place];  Service: Neurosurgery;  Laterality: N/A;  . APPENDECTOMY    . CESAREAN SECTION    . COLONOSCOPY    . ESOPHAGOGASTRODUODENOSCOPY (EGD) WITH PROPOFOL N/A 06/30/2013   Procedure: ESOPHAGOGASTRODUODENOSCOPY (EGD) WITH PROPOFOL;  Surgeon: Arta Silence, MD;  [facility];  Service: Endoscopy;  Laterality: N/A;  .  NECK SURGERY    . WEDGE RESECTION     left ovary with cyst removed ,hx. multiple cysts removed prior     OB History   No obstetric history on file.     Family History  Problem Relation Age of Onset  . Heart attack Mother 4       with CABG  . Heart disease Mother   . Heart attack Father   . Heart disease Father   . Clotting disorder Sister        lung cancer  . Lupus Sister   . Diabetes Brother   . Renal Disease Brother   . Diabetes Sister   . Diabetes Sister   . Healthy Sister   . Healthy Sister   . Healthy Sister     Social History   Tobacco Use  . Smoking status: Current Some Day Smoker    Packs/day: 0.12    Years: 36.00    Pack years: 4.32  . Smokeless tobacco: Never Used  . Tobacco comment: 2 - 3 cigs  Substance Use Topics  . Alcohol use: No  . Drug use: No    Home Medications Prior to Admission medications   Medication Sig Start Date End Date Taking? Authorizing Provider  clonazePAM (KLONOPIN) 1 MG tablet Take 1 mg by mouth 2 (two) times daily as needed for anxiety.     [provider]  cloNIDine (CATAPRES) 0.1 MG tablet Take 0.1 mg by mouth 2 (two) times daily.     [provider]  gabapentin (NEURONTIN) 800 MG tablet Take 800 mg by mouth 3 (three) times daily. 05/12/17   [provider]  lamoTRIgine (LAMICTAL) 100 MG tablet Take 1 tablet (100 mg total) by mouth 2 (two) times daily. 06/03/18   Sater, Nanine Means, MD  levorphanol (LEVODROMORAN) 2 MG tablet Take 2 mg by mouth every 8 (eight) hours as needed for pain.     [provider]  sertraline (ZOLOFT) 100 MG tablet Take 100 mg by mouth at bedtime.     [provider]  tiZANidine (ZANAFLEX) 2 MG tablet Take 2 mg by mouth every 8 (eight) hours as needed for muscle spasms.  02/18/18   [provider]    Allergies    Lodine [etodolac] and Ambien [zolpidem tartrate]  Review of Systems   Review of Systems  Constitutional: Negative for fever.    Respiratory: Positive for shortness of breath. Negative for cough.   Cardiovascular: Negative for chest pain.  Gastrointestinal: Negative for abdominal pain, nausea and vomiting.  Genitourinary: Negative for dysuria.  Musculoskeletal: Positive for myalgias.  Neurological: Negative for numbness.  All other systems reviewed and are negative.   Physical Exam Updated Vital Signs BP 122/71   Pulse (!) 49   Temp 98 F (36.7 C) (Oral)   Resp 15   Ht 1.575 m (5\' 2" )   Wt 75 kg   LMP 10/16/2011   SpO2 96%   BMI 30.24 kg/m   Physical Exam Vitals and nursing note reviewed.  Constitutional:      Appearance: She is well-developed. She is not ill-appearing.  HENT:     Head: Normocephalic and atraumatic.     Mouth/Throat:     Mouth: Mucous membranes are moist.  Eyes:     Pupils: Pupils are equal, round, and reactive to light.  Cardiovascular:     Rate and Rhythm: Normal rate and regular rhythm.     Heart sounds: Normal heart sounds.  Pulmonary:     Effort: Pulmonary effort is normal. No respiratory distress.     Breath sounds: No wheezing.  Abdominal:     General: Bowel sounds are normal.     Palpations: Abdomen is soft.     Tenderness: There is no abdominal tenderness.  Musculoskeletal:     Cervical back: Neck supple.     Right lower leg: No edema.     Left lower leg: No edema.  Skin:    General: Skin is warm and dry.  Neurological:     Mental Status: She is alert and oriented to person, place, and time.  Psychiatric:        Mood and Affect: Mood normal.     ED Results / Procedures / Treatments   Labs (all labs ordered are listed, but only abnormal results are displayed) Labs Reviewed  CBC WITH DIFFERENTIAL/PLATELET - Abnormal; Notable for the following components:      Result Value   WBC 2.8 (*)    Neutro Abs 0.9 (*)    All other components within normal limits  COMPREHENSIVE METABOLIC PANEL - Abnormal; Notable for the following components:   Glucose, Bld 101  (*)    All other components within normal limits  PATHOLOGIST SMEAR REVIEW  I-STAT BETA HCG BLOOD, ED ([facility], [facility], [facility] ONLY)    EKG EKG Interpretation  Date/Time:  Saturday January 08 2020 23:40:04 EST Ventricular Rate:  53 PR Interval:  110 QRS Duration: 76 QT Interval:  420 QTC Calculation: 394 R Axis:   31 Text Interpretation: Sinus bradycardia with short PR Cannot rule out Anterior infarct , age undetermined Abnormal ECG Confirmed by Thayer Jew [phone]) on 01/09/2020 1:50:15 AM   Radiology DG Chest Portable 1 View  Result Date: 01/09/2020 CLINICAL DATA:  COVID-19 positive 3 days ago, short of breath, cough, hypoxia EXAM: PORTABLE CHEST 1 VIEW COMPARISON:  01/05/2020 FINDINGS: The heart size and mediastinal contours are within normal limits. Both lungs are clear. The visualized skeletal structures are unremarkable. IMPRESSION: No active disease. Electronically Signed   By: Randa Ngo M.D.   On: 01/09/2020 02:25    Procedures Procedures (including critical care time)  Medications Ordered in ED Medications  ketorolac (TORADOL) 15 MG/ML injection 15 mg (15 mg Intravenous Given 01/09/20 0255)    ED Course  I have reviewed the triage vital signs and the nursing notes.  Pertinent labs & imaging results that were available during my care of the patient were reviewed by me and considered in my medical decision making (see chart for details).    MDM Rules/Calculators/A&P                       Patient presents with shortness of breath in the setting of COVID-19 infection.  She is approximately 3 to 4 days into illness.  She is overall nontoxic and vital signs are reassuring.  Here her pulse ox is greater than 96%.  It is 99% while in the room.  She is in no respiratory distress.  Physical exam is benign.  Lab work reviewed.  Leukopenia noted which is consistent with COVID-19.  Otherwise EKG is nonischemic.  Chest x-ray does not show any obvious pneumonia.  Patient was given  Toradol for her myalgias.  Recommend ongoing supportive measures.  Of note, she did mention low O2 sat at home.  She was monitored in the emergency room for greater than 4 hours with no desaturations noted.  I recommend she continue to monitor closely at home.  Allow home pulse oximeter to equilibrate.  Patient was given strict return precautions.  Saqqara Nemet Wilhoite was evaluated in Emergency Department on 01/09/2020 for the symptoms described in the history of present illness. She was evaluated in the context of the global COVID-19 pandemic, which necessitated consideration that the patient might be at risk for infection with the SARS-CoV-2 virus that causes COVID-19. Institutional protocols and algorithms that pertain to the evaluation of patients at risk for COVID-19 are in a state of rapid change based on information released by regulatory bodies including the CDC and federal and state organizations. These policies and algorithms were followed during the patient's care in the ED.  After history, exam, and medical workup I feel the patient has been appropriately medically screened and is safe for discharge home. Pertinent diagnoses were discussed with the patient. Patient was given return precautions.   Final Clinical Impression(s) / ED Diagnoses Final diagnoses:  COVID-19  SOB (shortness of breath)    Rx / DC Orders ED Discharge Orders    None       Jumar Greenstreet, Barbette Hair, MD 01/09/20 [phone]

## 2020-01-11 LAB — PATHOLOGIST SMEAR REVIEW

## 2020-01-31 ENCOUNTER — Encounter (HOSPITAL_COMMUNITY): Payer: Self-pay | Admitting: *Deleted

## 2020-01-31 ENCOUNTER — Other Ambulatory Visit: Payer: Self-pay

## 2020-01-31 ENCOUNTER — Emergency Department (HOSPITAL_COMMUNITY)
Admission: EM | Admit: 2020-01-31 | Discharge: 2020-02-01 | Disposition: A | Discharge status: Home / Self Care | Payer: Medicare Other | Attending: Emergency Medicine | Admitting: Emergency Medicine

## 2020-01-31 ENCOUNTER — Emergency Department (HOSPITAL_COMMUNITY): Payer: Medicare Other

## 2020-01-31 DIAGNOSIS — G35 Multiple sclerosis: Secondary | ICD-10-CM | POA: Insufficient documentation

## 2020-01-31 DIAGNOSIS — Z8673 Personal history of transient ischemic attack (TIA), and cerebral infarction without residual deficits: Secondary | ICD-10-CM | POA: Diagnosis not present

## 2020-01-31 DIAGNOSIS — F17200 Nicotine dependence, unspecified, uncomplicated: Secondary | ICD-10-CM | POA: Diagnosis not present

## 2020-01-31 DIAGNOSIS — R202 Paresthesia of skin: Secondary | ICD-10-CM | POA: Diagnosis not present

## 2020-01-31 DIAGNOSIS — R519 Headache, unspecified: Secondary | ICD-10-CM | POA: Diagnosis present

## 2020-01-31 DIAGNOSIS — Z86718 Personal history of other venous thrombosis and embolism: Secondary | ICD-10-CM | POA: Insufficient documentation

## 2020-01-31 MED ORDER — LORAZEPAM 1 MG PO TABS
2.0000 mg | ORAL_TABLET | Freq: Once | ORAL | Status: AC
  Administered 2020-02-01: 2 mg via ORAL
  Filled 2020-01-31: qty 2

## 2020-01-31 NOTE — ED Notes (Signed)
Pt. In the lobby in a W/C continues to lean over form one side to the other, continuous monitoring and having pt to sit up. Pt falling asleep in W/C , moved pt to a recliner chair with her feet up in the lobby, waiting on a room.

## 2020-01-31 NOTE — ED Provider Notes (Signed)
[facility] EMERGENCY DEPARTMENT Provider Note   CSN: [id number] Arrival date & time: 01/31/20  1726   History Chief Complaint  Patient presents with  . Headache    Becky Wolfe is a 59 y.o. female.  The history is provided by the patient.  Headache She has history of multiple sclerosis, TIA, panic attacks and comes in because of something in her hair.  She had been walking in the woods and came home and showered and when she showered, she felt something moving in her hair and could see that there was something at least an inch long there.  She did take a dose of clonazepam and fell asleep but when she woke up, there was still a sensation of something moving in her hair.  She states that her husband also saw.  She is not actually complaining of a headache.   Past Medical History:  Diagnosis Date  . Anxiety 06-16-13   hx. panic attacks, none recent  . Blood transfusion without reported diagnosis    20 yrs ago after childbirth  . Bronchitis    hx of  . Chronic neck pain   . Constipation   . Depression   . DVT (deep venous thrombosis) ([place]) 2015   rt. leg   . Headache(784.0)    migraines-has decreased  . Heart murmur    Dr. Etter Sjogren  . Hepatitis C    dx. Hep. C(s/p transfusion age 47) low Hgb  .-multiple transfusions. Treated successfully  . Multiple sclerosis ([place])    dx. -35yrs ago, then med stopped-due to liver enzymes changes,occ. periods of weakness in arms"d.  was seen at [facility] and tolded that she does not have MSrops things"  . Pneumonia    hx of , ? mild emphysema on CT scan 5'14, multiple pulmonary nodules  . RSD (reflex sympathetic dystrophy) 06-16-13   legs  . TIA (transient ischemic attack)    hx of (weakness left side)-none in 1 yr    Patient Active Problem List   Diagnosis Date Noted  . Numbness 12/30/2018  . Cervical myelopathy ([place]) 08/05/2018  . Neck pain 02/19/2018  . History of fusion of cervical spine 02/19/2018  . Urinary  hesitancy 02/19/2018  . Chronic non-specific white matter lesions on MRI 02/19/2018  . Accidental overdose 03/07/2016  . Atherosclerosis of native coronary artery of native heart without angina pectoris 02/10/2015  . History of DVT (deep vein thrombosis) 02/10/2015  . Tobacco abuse 02/10/2015  . Right leg pain 04/23/2013  . DVT (deep vein thrombosis) in pregnancy 04/23/2013  . Chronic pain disorder 04/23/2013  . RSD lower limb 04/23/2013  . Chronic anticoagulation 04/23/2013    Past Surgical History:  Procedure Laterality Date  . ABDOMINAL HYSTERECTOMY    . ANAL RECTAL MANOMETRY N/A 04/11/2015   Procedure: ANO RECTAL MANOMETRY;  Surgeon: Arta Silence, MD;  [facility];  Service: Endoscopy;  Laterality: N/A;  . ANAL RECTAL MANOMETRY N/A 05/21/2017   Procedure: ANO RECTAL MANOMETRY;  Surgeon: Arta Silence, MD;  [facility];  Service: Endoscopy;  Laterality: N/A;  . ANKLE SURGERY Right    no retained hardware  . ANTERIOR CERVICAL DECOMP/DISCECTOMY FUSION  09/21/2012   Procedure: ANTERIOR CERVICAL DECOMPRESSION/DISCECTOMY FUSION 2 LEVELS;  Surgeon: Otilio Connors, MD;  Location: [place] NEURO ORS;  Service: Neurosurgery;  Laterality: N/A;  Cervical five-six, cervical seven-thoracic one Anterior cervical decompression/diskectomy/fusion/Lifenet bone/trestle plate/Prior Cervical six--seven Anterior cervical fusion/Removing trestle plate  . ANTERIOR CERVICAL DECOMP/DISCECTOMY FUSION N/A 08/05/2018  Procedure: Cervical four-five Anterior cervical decompression/discectomy/fusion;  Surgeon: Judith Part, MD;  Location: [place];  Service: Neurosurgery;  Laterality: N/A;  . APPENDECTOMY    . CESAREAN SECTION    . COLONOSCOPY    . ESOPHAGOGASTRODUODENOSCOPY (EGD) WITH PROPOFOL N/A 06/30/2013   Procedure: ESOPHAGOGASTRODUODENOSCOPY (EGD) WITH PROPOFOL;  Surgeon: Arta Silence, MD;  [facility];  Service: Endoscopy;  Laterality: N/A;  . NECK SURGERY    . WEDGE  RESECTION     left ovary with cyst removed ,hx. multiple cysts removed prior     OB History   No obstetric history on file.     Family History  Problem Relation Age of Onset  . Heart attack Mother 35       with CABG  . Heart disease Mother   . Heart attack Father   . Heart disease Father   . Clotting disorder Sister        lung cancer  . Lupus Sister   . Diabetes Brother   . Renal Disease Brother   . Diabetes Sister   . Diabetes Sister   . Healthy Sister   . Healthy Sister   . Healthy Sister     Social History   Tobacco Use  . Smoking status: Current Some Day Smoker    Packs/day: 0.12    Years: 36.00    Pack years: 4.32  . Smokeless tobacco: Never Used  . Tobacco comment: 2 - 3 cigs  Substance Use Topics  . Alcohol use: No  . Drug use: No    Home Medications Prior to Admission medications   Medication Sig Start Date End Date Taking? Authorizing Provider  clonazePAM (KLONOPIN) 1 MG tablet Take 1 mg by mouth 2 (two) times daily as needed for anxiety.     [provider]  cloNIDine (CATAPRES) 0.1 MG tablet Take 0.1 mg by mouth 2 (two) times daily.     [provider]  gabapentin (NEURONTIN) 800 MG tablet Take 800 mg by mouth 3 (three) times daily. 05/12/17   [provider]  lamoTRIgine (LAMICTAL) 100 MG tablet Take 1 tablet (100 mg total) by mouth 2 (two) times daily. 06/03/18   Sater, Nanine Means, MD  levorphanol (LEVODROMORAN) 2 MG tablet Take 2 mg by mouth every 8 (eight) hours as needed for pain.     [provider]  sertraline (ZOLOFT) 100 MG tablet Take 100 mg by mouth at bedtime.     [provider]  tiZANidine (ZANAFLEX) 2 MG tablet Take 2 mg by mouth every 8 (eight) hours as needed for muscle spasms.  02/18/18   [provider]    Allergies    Lodine [etodolac] and Ambien [zolpidem tartrate]  Review of Systems   Review of Systems  Neurological: Positive for headaches.  All other systems reviewed and are  negative.   Physical Exam Updated Vital Signs BP 124/86   Pulse 76   Temp 98.6 F (37 C)   Resp 16   Ht 5\' 2"  (1.575 m)   Wt 61.2 kg   LMP 10/16/2011   SpO2 96%   BMI 24.69 kg/m   Physical Exam Vitals and nursing note reviewed.   59 year old female, very anxious, but resting comfortably and in no acute distress. Vital signs are normal. Oxygen saturation is 96%, which is normal. Head is normocephalic and atraumatic. PERRLA, EOMI. Oropharynx is clear.  Scalp is normal.  Careful inspection of the entire scalp and hair showed no foreign  body, no insect or anything else abnormal.  Scalp appeared normal. Neck is nontender and supple without adenopathy or JVD. Back is nontender and there is no CVA tenderness. Lungs are clear without rales, wheezes, or rhonchi. Chest is nontender. Heart has regular rate and rhythm with [id number] systolic ejection murmur best heard along the left sternal border. Abdomen is soft, flat, nontender without masses or hepatosplenomegaly and peristalsis is normoactive. Extremities have no cyanosis or edema, full range of motion is present. Skin is warm and dry without rash. Neurologic: Mental status is normal, cranial nerves are intact, there are no motor or sensory deficits.  ED Results / Procedures / Treatments    Radiology CT Head Wo Contrast  Result Date: 01/31/2020 CLINICAL DATA:  Headache. EXAM: CT HEAD WITHOUT CONTRAST TECHNIQUE: Contiguous axial images were obtained from the base of the skull through the vertex without intravenous contrast. COMPARISON:  October 08, 2016 FINDINGS: Brain: There is mild cerebral atrophy with widening of the extra-axial spaces and ventricular dilatation. There are areas of decreased attenuation within the white matter tracts of the supratentorial brain, consistent with microvascular disease changes. Vascular: No hyperdense vessel or unexpected calcification. Skull: Normal. Negative for fracture or focal lesion. Sinuses/Orbits:  No acute finding. Other: None. IMPRESSION: 1. Generalized cerebral atrophy. 2. No acute intracranial abnormality. Electronically Signed   By: Virgina Norfolk M.D.   On: 01/31/2020 18:41    Procedures Procedures   Medications Ordered in ED Medications  LORazepam (ATIVAN) tablet 2 mg (2 mg Oral Given 02/01/20 0018)    ED Course  I have reviewed the triage vital signs and the nursing notes.  Pertinent imaging results that were available during my care of the patient were reviewed by me and considered in my medical decision making (see chart for details).  MDM Rules/Calculators/A&P Sensation of insect in the scalp without evidence of anything actually there.  Patient actually became much more agitated when I told her that I could not find anything in her scalp.  She was asked multiple times to point to where she felt something moving currently, but she would point to area saying I felt it here earlier and I felt that there earlier.  The entire scalp was carefully inspected with no insect or other foreign object identified.  She had been sent for CT of head from triage which showed atrophy but no acute process.  She will be given a dose of lorazepam.  Old records are reviewed, and she has no relevant past visits.  Following above-noted medication, patient was able to sleep.  I have spoken with her husband he states that he had not seen anything in her hair, but he thinks that their daughter had seen something.  He is advised that there is nothing in the hair currently.  She is discharged with prescription for lorazepam and referred back to primary care physician.  If she continues to have sensation of things moving in her hair, she may need to be evaluated by psychiatry as an outpatient.  Final Clinical Impression(s) / ED Diagnoses Final diagnoses:  Formication    Rx / DC Orders ED Discharge Orders    None       Delora Fuel, MD [id number]

## 2020-01-31 NOTE — ED Triage Notes (Addendum)
Pt reports getting out of shower and feeling "something move" on top of her head. Reports now having a headache.room. Pt was sleeping while sitting in lobby. Reports taking anxiety meds prior to coming, is now falling asleep during her triage but then becomes very anxious once your wake her up. Pt is answering questions appropriately at triage, oriented x3.  Airway intact and vitals stable.

## 2020-01-31 NOTE — ED Notes (Signed)
Pt placed back in lobby near Sort RN, informed her that pt is high fall risk. Discussed patient with Sam, PA and got order for ct head.

## 2020-02-01 MED ORDER — LORAZEPAM 1 MG PO TABS: 1.0000 mg | ORAL_TABLET | Freq: Three times a day (TID) | ORAL | 0 refills | Status: DC | PRN

## 2020-02-01 NOTE — Discharge Instructions (Addendum)
There was not anything in your hair when you were evaluated in the emergency department.  Return if you are having any problems.

## 2020-02-12 ENCOUNTER — Ambulatory Visit: Payer: Medicare Other | Attending: Internal Medicine

## 2020-02-12 DIAGNOSIS — Z23 Encounter for immunization: Secondary | ICD-10-CM

## 2020-02-12 NOTE — Progress Notes (Signed)
   Covid-19 Vaccination Clinic  Name:  Becky Wolfe    MRN: [id number] DOB: [birth year]  02/12/2020  [name] was observed post Covid-19 immunization for 15 minutes without incident. She was provided with Vaccine Information Sheet and instruction to access the V-Safe system.   [name] was instructed to call 911 with any severe reactions post vaccine: Marland Kitchen Difficulty breathing  . Swelling of face and throat  . A fast heartbeat  . A bad rash all over body  . Dizziness and weakness   Immunizations Administered    Name Date Dose VIS Date Route   Moderna COVID-19 Vaccine 02/12/2020  1:34 PM 0.5 mL 10/12/2019 Intramuscular   Manufacturer: Moderna   Lot: [id number]   [id number]

## 2020-03-11 ENCOUNTER — Ambulatory Visit: Payer: Medicare Other | Attending: Critical Care Medicine

## 2020-03-11 DIAGNOSIS — Z23 Encounter for immunization: Secondary | ICD-10-CM

## 2020-03-11 NOTE — Progress Notes (Signed)
   Covid-19 Vaccination Clinic  Name:  Becky Wolfe    MRN: [id number] DOB: May 29, 1961  03/11/2020  [name] was observed post Covid-19 immunization for 15 minutes without incident. She was provided with Vaccine Information Sheet and instruction to access the V-Safe system.   [name] was instructed to call 911 with any severe reactions post vaccine: Marland Kitchen Difficulty breathing  . Swelling of face and throat  . A fast heartbeat  . A bad rash all over body  . Dizziness and weakness   Immunizations Administered    Name Date Dose VIS Date Route   Moderna COVID-19 Vaccine 03/11/2020 12:05 PM 0.5 mL 10/2019 Intramuscular   Manufacturer: Moderna   LotFP:[id number]   [id number]

## 2020-04-03 ENCOUNTER — Telehealth: Payer: Self-pay | Admitting: Neurology

## 2020-04-03 DIAGNOSIS — Z981 Arthrodesis status: Secondary | ICD-10-CM

## 2020-04-03 DIAGNOSIS — R200 Anesthesia of skin: Secondary | ICD-10-CM

## 2020-04-03 DIAGNOSIS — M542 Cervicalgia: Secondary | ICD-10-CM

## 2020-04-03 MED ORDER — CYCLOBENZAPRINE HCL 5 MG PO TABS: 5.0000 mg | ORAL_TABLET | Freq: Three times a day (TID) | ORAL | 1 refills | Status: DC | PRN

## 2020-04-03 NOTE — Telephone Encounter (Signed)
She is reporting numbness in both arms and worse range of motion her neck.  She has had 3 operations on her spine most recently fused at C4-C5.  She is fused from C4-T1.  Due to the new symptoms, we need to check an MRI of the cervical spine to determine if there is further degenerative change.  I would be most concerned about the C3-C4 interspace.  Additionally, we need to check an x-ray of the cervical spine with flexion and extension to determine if there is any pseudoarthrosis.  No change in gait or leg strength.  She has taken some old methocarbamol for the stiffness but it has not helped.  I will call in Flexeril.

## 2020-04-03 NOTE — Telephone Encounter (Signed)
Phone rep checked office voicemail's, @2 :38 pt left message stating she is having numbness in arms and difficulty turning neck from side to side.  Pt was called, phone rep scheduled pt, pt her on wait list and pt is asking for a call from RN re: what Dr Felecia Shelling would suggest she do re: what she is going thru at this time.

## 2020-04-04 ENCOUNTER — Telehealth: Payer: Self-pay | Admitting: Neurology

## 2020-04-04 NOTE — Telephone Encounter (Signed)
[employer] medicare order sent to [facility]. No auth they will reach out to the patient to schedule.  

## 2020-04-11 ENCOUNTER — Other Ambulatory Visit: Payer: Self-pay | Admitting: Gastroenterology

## 2020-04-11 DIAGNOSIS — R109 Unspecified abdominal pain: Secondary | ICD-10-CM

## 2020-04-15 ENCOUNTER — Other Ambulatory Visit: Payer: Self-pay

## 2020-04-15 ENCOUNTER — Ambulatory Visit
Admission: RE | Admit: 2020-04-15 | Discharge: 2020-04-15 | Disposition: A | Discharge status: Home / Self Care | Payer: Medicare Other | Source: Ambulatory Visit | Attending: Neurology | Admitting: Neurology

## 2020-04-15 DIAGNOSIS — Z981 Arthrodesis status: Secondary | ICD-10-CM

## 2020-04-15 DIAGNOSIS — R202 Paresthesia of skin: Secondary | ICD-10-CM

## 2020-04-15 DIAGNOSIS — M542 Cervicalgia: Secondary | ICD-10-CM

## 2020-04-15 DIAGNOSIS — R200 Anesthesia of skin: Secondary | ICD-10-CM

## 2020-04-18 ENCOUNTER — Telehealth: Payer: Self-pay | Admitting: *Deleted

## 2020-04-18 DIAGNOSIS — M5020 Other cervical disc displacement, unspecified cervical region: Secondary | ICD-10-CM

## 2020-04-18 NOTE — Telephone Encounter (Signed)
-----   Message from Britt Bottom, MD sent at 04/18/2020  2:17 PM EDT ----- The MRI does show that the disc protrusion above the fusion (C3C4) is a little worse --- if not better, consider seeing her neurosurgeon

## 2020-04-18 NOTE — Telephone Encounter (Signed)
Tried calling pt at [phone]. Phone just kept ringing. Tried calling her at [phone]. Received automated message that person unavailable. No DPR on file currently.

## 2020-04-20 NOTE — Telephone Encounter (Signed)
Called and spoke with pt about results per Dr. Felecia Shelling note. She verbalized understanding. States she is not feeling better, sx continue to worsen. Would like referral to Dr. Emelda Brothers at [facility]. This is who she has seen previously. I placed referral. Asked her to call back if she does not hear about getting scheduled for an appt.

## 2020-04-27 ENCOUNTER — Other Ambulatory Visit: Payer: Self-pay

## 2020-04-27 ENCOUNTER — Ambulatory Visit
Admission: RE | Admit: 2020-04-27 | Discharge: 2020-04-27 | Disposition: A | Discharge status: Home / Self Care | Payer: Medicare Other | Source: Ambulatory Visit | Attending: Gastroenterology | Admitting: Gastroenterology

## 2020-04-27 DIAGNOSIS — R109 Unspecified abdominal pain: Secondary | ICD-10-CM

## 2020-04-27 MED ORDER — IOPAMIDOL (ISOVUE-300) INJECTION 61%
100.0000 mL | Freq: Once | INTRAVENOUS | Status: AC | PRN
  Administered 2020-04-27: 100 mL via INTRAVENOUS

## 2020-04-28 ENCOUNTER — Other Ambulatory Visit: Payer: Medicare Other

## 2020-05-16 ENCOUNTER — Telehealth: Payer: Self-pay

## 2020-05-16 NOTE — Telephone Encounter (Signed)
Becky Wolfe is a 59 y.o. female called saying she was suppose to have a referral to [facility] from Dr. Felecia Shelling.  Pt said the center name changed and she does not know the new name but Dr. Felecia Shelling put it in his notes. Pt is requesting a call back.

## 2020-05-17 NOTE — Telephone Encounter (Signed)
[facility] called patient and she has apt 05/18/2020 .

## 2020-06-08 ENCOUNTER — Ambulatory Visit: Payer: Medicare Other | Admitting: Physician Assistant

## 2020-06-22 ENCOUNTER — Ambulatory Visit: Payer: Medicare Other | Admitting: Neurology

## 2020-06-22 ENCOUNTER — Encounter: Payer: Self-pay | Admitting: Neurology

## 2020-06-22 VITALS — BP 135/76 | HR 69 | Ht 62.0 in | Wt 149.5 lb

## 2020-06-22 DIAGNOSIS — G894 Chronic pain syndrome: Secondary | ICD-10-CM

## 2020-06-22 DIAGNOSIS — R200 Anesthesia of skin: Secondary | ICD-10-CM | POA: Diagnosis not present

## 2020-06-22 DIAGNOSIS — G90529 Complex regional pain syndrome I of unspecified lower limb: Secondary | ICD-10-CM

## 2020-06-22 DIAGNOSIS — Z981 Arthrodesis status: Secondary | ICD-10-CM | POA: Diagnosis not present

## 2020-06-22 DIAGNOSIS — M5020 Other cervical disc displacement, unspecified cervical region: Secondary | ICD-10-CM | POA: Insufficient documentation

## 2020-06-22 MED ORDER — LAMOTRIGINE 25 MG PO TABS: ORAL_TABLET | ORAL | 5 refills | Status: DC

## 2020-06-22 NOTE — Patient Instructions (Signed)
Take 1 a day x 5 days then  Take 1 twice a day x 5 days then Take 1 three times a day x 5 days then Take 2 twice a day  If a rash develops, stop the medication

## 2020-06-22 NOTE — Progress Notes (Signed)
[facility]  PATIENT: Becky Wolfe DOB:  12-23-1960  REFERRING DOCTOR OR PCP:  Serita Grammes SOURCE: Patient, notes from primary care, imaging and lab reports, multiple MRI and CT images on PACS personally reviewed  _________________________________   HISTORICAL  CHIEF COMPLAINT:  Chief Complaint  Patient presents with  . Follow-up    RM 12 with husband. Last seen 12/30/2018. Here to f/u on neck pain/numb. Saw Dr. Emelda Brothers in July. Did not see any reason for sugical intervention.     HISTORY OF PRESENT ILLNESS:  Becky Wolfe is a 59 yo woman with neck pain and neurologic issues.  Update 06/22/2020: She continues to experience neck pain and numbness in her arms > legs.   She has fusion C4-T and an HNP at [facility] ([facility]), progressed compared to 2020.   She saw [facility] and was told she did not need additional surgery.     She is on gabapentin, flexeril for neuromuscular pain.   She was once on lamotrigine but stopped.   She is unsure if the lamotrigine was helping her or not but took only one month as she felt strange.  I had tried to titrate her to 100 mg po bid.   She sees Pain Medicine (Dr. Mechele Dawley) and is on Katie.    She has RSD and gets nerve blocks periodically.     She was on butorphanol and felt it helped her more than Nucynta.     She has anxiety and panic attacks and is on sertraline  She had Covid-19 February and had her vaccination in April   REVIEW OF SYSTEMS: Constitutional: No fevers, chills, sweats, or change in appetite.   SOme fatigue Eyes: No visual changes, double vision, eye pain Ear, nose and throat: No hearing loss, ear pain, nasal congestion, sore throat Cardiovascular: No chest pain, palpitations Respiratory: No shortness of breath at rest or with exertion.   No wheezes GastrointestinaI: No nausea, vomiting, diarrhea, abdominal pain, fecal incontinence Genitourinary:She has hesitancy.   Musculoskeletal: as above    Integumentary: No rash, pruritus, skin lesions Neurological: as above Psychiatric: No depression at this time.  No anxiety Endocrine: No palpitations, diaphoresis, change in appetite, change in weigh or increased thirst Hematologic/Lymphatic: No anemia, purpura, petechiae. Allergic/Immunologic: No itchy/runny eyes, nasal congestion, recent allergic reactions, rashes  ALLERGIES: Allergies  Allergen Reactions  . Lodine [Etodolac] Swelling and Other (See Comments)    Facial swelling   . Ambien [Zolpidem Tartrate] Other (See Comments)    Headaches, memory loss     HOME MEDICATIONS:  Current Outpatient Medications:  .  cloNIDine (CATAPRES) 0.1 MG tablet, Take 0.1 mg by mouth 2 (two) times daily. , Disp: , Rfl:  .  cyclobenzaprine (FLEXERIL) 5 MG tablet, Take 1 tablet (5 mg total) by mouth every 8 (eight) hours as needed for muscle spasms., Disp: 90 tablet, Rfl: 1 .  gabapentin (NEURONTIN) 800 MG tablet, Take 800 mg by mouth 3 (three) times daily., Disp: , Rfl: 1 .  sertraline (ZOLOFT) 100 MG tablet, Take 100 mg by mouth at bedtime. , Disp: , Rfl:  .  Tapentadol HCl (NUCYNTA ER) 150 MG TB12, Take 1 tablet by mouth daily., Disp: , Rfl:  .  Tapentadol HCl (NUCYNTA) 100 MG TABS, Take 100 mg by mouth in the morning and at bedtime. Takes twice a day for breakthrough pain. Also takes Nucynta 150mg  ER daily, Disp: , Rfl:  .  lamoTRIgine (LAMICTAL) 25 MG tablet, One po qd x  5 d then one po bid x 5d then one po tid x 5d then 2 po bid, Disp: 120 tablet, Rfl: 5  PAST MEDICAL HISTORY: Past Medical History:  Diagnosis Date  . Anxiety 06-16-13   hx. panic attacks, none recent  . Blood transfusion without reported diagnosis    20 yrs ago after childbirth  . Bronchitis    hx of  . Chronic neck pain   . Constipation   . Depression   . DVT (deep venous thrombosis) ([place]) 2015   rt. leg   . Headache(784.0)    migraines-has decreased  . Heart murmur    Dr. Etter Sjogren  . Hepatitis C    dx. Hep.  C(s/p transfusion age 40) low Hgb  .-multiple transfusions. Treated successfully  . Multiple sclerosis ([place])    dx. -55yrs ago, then med stopped-due to liver enzymes changes,occ. periods of weakness in arms"d.  was seen at [facility] and tolded that she does not have MSrops things"  . Pneumonia    hx of , ? mild emphysema on CT scan 5'14, multiple pulmonary nodules  . RSD (reflex sympathetic dystrophy) 06-16-13   legs  . TIA (transient ischemic attack)    hx of (weakness left side)-none in 1 yr    PAST SURGICAL HISTORY: Past Surgical History:  Procedure Laterality Date  . ABDOMINAL HYSTERECTOMY    . ANAL RECTAL MANOMETRY N/A 04/11/2015   Procedure: ANO RECTAL MANOMETRY;  Surgeon: Arta Silence, MD;  Location: [facility];  Service: Endoscopy;  Laterality: N/A;  . ANAL RECTAL MANOMETRY N/A 05/21/2017   Procedure: ANO RECTAL MANOMETRY;  Surgeon: Arta Silence, MD;  Location: [facility];  Service: Endoscopy;  Laterality: N/A;  . ANKLE SURGERY Right    no retained hardware  . ANTERIOR CERVICAL DECOMP/DISCECTOMY FUSION  09/21/2012   Procedure: ANTERIOR CERVICAL DECOMPRESSION/DISCECTOMY FUSION 2 LEVELS;  Surgeon: Otilio Connors, MD;  Location: [facility];  Service: Neurosurgery;  Laterality: N/A;  Cervical five-six, cervical seven-thoracic one Anterior cervical decompression/diskectomy/fusion/Lifenet bone/trestle plate/Prior Cervical six--seven Anterior cervical fusion/Removing trestle plate  . ANTERIOR CERVICAL DECOMP/DISCECTOMY FUSION N/A 08/05/2018   Procedure: Cervical four-five Anterior cervical decompression/discectomy/fusion;  Surgeon: Judith Part, MD;  Location: [place];  Service: Neurosurgery;  Laterality: N/A;  . APPENDECTOMY    . CESAREAN SECTION    . COLONOSCOPY    . ESOPHAGOGASTRODUODENOSCOPY (EGD) WITH PROPOFOL N/A 06/30/2013   Procedure: ESOPHAGOGASTRODUODENOSCOPY (EGD) WITH PROPOFOL;  Surgeon: Arta Silence, MD;  Location: [facility];  Service: Endoscopy;  Laterality:  N/A;  . NECK SURGERY    . WEDGE RESECTION     left ovary with cyst removed ,hx. multiple cysts removed prior    FAMILY HISTORY: Family History  Problem Relation Age of Onset  . Heart attack Mother 80       with CABG  . Heart disease Mother   . Heart attack Father   . Heart disease Father   . Clotting disorder Sister        lung cancer  . Lupus Sister   . Diabetes Brother   . Renal Disease Brother   . Diabetes Sister   . Diabetes Sister   . Healthy Sister   . Healthy Sister   . Healthy Sister     SOCIAL HISTORY:  Social History   Socioeconomic History  . Marital status: Married    Spouse name: Not on file  . Number of children: Not on file  . Years of education: Not on file  . Highest  education level: Not on file  Occupational History  . Not on file  Tobacco Use  . Smoking status: Current Some Day Smoker    Packs/day: 0.12    Years: 36.00    Pack years: 4.32  . Smokeless tobacco: Never Used  . Tobacco comment: 2 - 3 cigs  Vaping Use  . Vaping Use: Never used  Substance and Sexual Activity  . Alcohol use: No  . Drug use: No  . Sexual activity: Yes    Birth control/protection: Surgical  Other Topics Concern  . Not on file  Social History Narrative  . Not on file   Social Determinants of Health   Financial Resource Strain:   . Difficulty of Paying Living Expenses:   Food Insecurity:   . Worried About Charity fundraiser in the Last Year:   . Arboriculturist in the Last Year:   Transportation Needs:   . Film/video editor (Medical):   Marland Kitchen Lack of Transportation (Non-Medical):   Physical Activity:   . Days of Exercise per Week:   . Minutes of Exercise per Session:   Stress:   . Feeling of Stress :   Social Connections:   . Frequency of Communication with Friends and Family:   . Frequency of Social Gatherings with Friends and Family:   . Attends Religious Services:   . Active Member of Clubs or Organizations:   . Attends Archivist  Meetings:   Marland Kitchen Marital Status:   Intimate Partner Violence:   . Fear of Current or Ex-Partner:   . Emotionally Abused:   Marland Kitchen Physically Abused:   . Sexually Abused:      PHYSICAL EXAM  Vitals:   06/22/20 1436  BP: 135/76  Pulse: 69  Weight: 149 lb 8 oz (67.8 kg)  Height: 5\' 2"  (1.575 m)    Body mass index is 27.34 kg/m.   General: The patient is well-developed and well-nourished and in no acute distress   Neck:   The neck is mildly tender with a reduced range of motion  Neurologic Exam  Mental status: The patient is alert and oriented x 3 at the time of the examination. The patient has apparent normal recent and remote memory, with an apparently normal attention span and concentration ability.   Speech is normal.  Cranial nerves: Extraocular movements are full.  Facial strength and sensation is normal.  Trapezius strength is normal.   No obvious hearing deficits are noted.  Motor:  Muscle bulk is normal.   Tone is normal. Strength is  5 / 5 in all 4 extremities except 4 plus/5 triceps on the right  Sensory: She reports reduced sensation to touch and temperature in the right arm relative to the left arm and more so in the right leg relative to the left leg.  Additionally in the trunk there was reduced temperature sensation below the neck on the right relative to the left.  Vibration sensation was less in the right leg relative to the left but the arms were more symmetric.  Coordination: Cerebellar testing showed normal finger-nose-finger.  Gait and station: Station is normal.   The gait is mildly wide.  The stride is mildly reduced and tandem gait is mildly wide.. Romberg is negative.   Reflexes: Deep tendon reflexes are symmetric and normal bilaterally.       DIAGNOSTIC DATA (LABS, IMAGING, TESTING) - I reviewed patient records, labs, notes, testing and imaging myself where available.  Lab Results  Component Value Date   WBC 2.8 (L) 01/08/2020   HGB 13.6 01/08/2020    HCT 39.8 01/08/2020   MCV 87.3 01/08/2020   PLT 175 01/08/2020      Component Value Date/Time   NA 140 01/08/2020 2348   K 3.9 01/08/2020 2348   CL 106 01/08/2020 2348   CO2 26 01/08/2020 2348   GLUCOSE 101 (H) 01/08/2020 2348   BUN 11 01/08/2020 2348   CREATININE 0.80 01/08/2020 2348   CREATININE 0.71 08/08/2011 1600   CALCIUM 8.9 01/08/2020 2348   PROT 6.7 01/08/2020 2348   ALBUMIN 3.8 01/08/2020 2348   AST 21 01/08/2020 2348   ALT 19 01/08/2020 2348   ALKPHOS 81 01/08/2020 2348   BILITOT 0.5 01/08/2020 2348   GFRNONAA >60 01/08/2020 2348   GFRNONAA >60 08/08/2011 1600   GFRAA >60 01/08/2020 2348   GFRAA >60 08/08/2011 1600   Lab Results  Component Value Date   CHOL 129 09/02/2012   HDL 60 09/02/2012   LDLCALC 44 09/02/2012   TRIG 124 09/02/2012   CHOLHDL 2.2 09/02/2012   Lab Results  Component Value Date   HGBA1C 5.6 09/02/2012   No results found for: VITAMINB12 Lab Results  Component Value Date   TSH 0.620 08/08/2011       ASSESSMENT AND PLAN  Protruded cervical disc  History of fusion of cervical spine  Numbness  Chronic pain disorder  Complex regional pain syndrome type 1 of lower extremity, unspecified laterality   1.     She will continue to see pain management for her RSD and related medications.  Opioids should also help her musculoskeletal pain. 2.    Continue gabapentin and retry lamotrigine at lower dose to enhance tolerability for the neurogenic component of her pain.    3.    Stay active and exercise as tolerated.   4.    She will return to see me in 6 months and call sooner if new or worsening neurologic symptoms.   Pernell Dikes A. Felecia Shelling, MD, [facility] 6/54/6503, 5:46 PM Certified in Neurology, Clinical Neurophysiology, Sleep Medicine, Pain Medicine and Neuroimaging  [facility] [address], [place], [place] [address] [phone]

## 2020-07-03 ENCOUNTER — Ambulatory Visit (INDEPENDENT_AMBULATORY_CARE_PROVIDER_SITE_OTHER): Payer: Medicare Other | Admitting: Physician Assistant

## 2020-07-03 ENCOUNTER — Encounter: Payer: Self-pay | Admitting: Physician Assistant

## 2020-07-03 ENCOUNTER — Other Ambulatory Visit: Payer: Self-pay

## 2020-07-03 VITALS — BP 156/83 | HR 81 | Ht 62.0 in | Wt 152.0 lb

## 2020-07-03 DIAGNOSIS — F411 Generalized anxiety disorder: Secondary | ICD-10-CM | POA: Diagnosis not present

## 2020-07-03 DIAGNOSIS — F410 Panic disorder [episodic paroxysmal anxiety] without agoraphobia: Secondary | ICD-10-CM

## 2020-07-03 DIAGNOSIS — F99 Mental disorder, not otherwise specified: Secondary | ICD-10-CM

## 2020-07-03 DIAGNOSIS — F5105 Insomnia due to other mental disorder: Secondary | ICD-10-CM

## 2020-07-03 DIAGNOSIS — F331 Major depressive disorder, recurrent, moderate: Secondary | ICD-10-CM | POA: Diagnosis not present

## 2020-07-03 MED ORDER — CLONAZEPAM 0.5 MG PO TABS: 0.5000 mg | ORAL_TABLET | Freq: Two times a day (BID) | ORAL | 1 refills | Status: DC | PRN
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
## 2020-08-15 ENCOUNTER — Ambulatory Visit: Payer: Medicare Other | Admitting: Physician Assistant

## 2020-09-13 ENCOUNTER — Ambulatory Visit (INDEPENDENT_AMBULATORY_CARE_PROVIDER_SITE_OTHER): Payer: Medicare Other | Admitting: Physician Assistant

## 2020-09-13 ENCOUNTER — Encounter: Payer: Self-pay | Admitting: Physician Assistant

## 2020-09-13 ENCOUNTER — Other Ambulatory Visit: Payer: Self-pay

## 2020-09-13 DIAGNOSIS — F330 Major depressive disorder, recurrent, mild: Secondary | ICD-10-CM | POA: Diagnosis not present

## 2020-09-13 DIAGNOSIS — F5105 Insomnia due to other mental disorder: Secondary | ICD-10-CM | POA: Diagnosis not present

## 2020-09-13 DIAGNOSIS — F99 Mental disorder, not otherwise specified: Secondary | ICD-10-CM | POA: Diagnosis not present

## 2020-09-13 DIAGNOSIS — F411 Generalized anxiety disorder: Secondary | ICD-10-CM

## 2020-09-13 MED ORDER — SERTRALINE HCL 100 MG PO TABS: 150.0000 mg | ORAL_TABLET | Freq: Every day | ORAL | 0 refills | Status: DC

## 2020-09-13 MED ORDER — CLONAZEPAM 0.5 MG PO TABS: 0.5000 mg | ORAL_TABLET | Freq: Two times a day (BID) | ORAL | 1 refills | Status: DC | PRN

## 2020-09-13 NOTE — Progress Notes (Signed)
[facility]  Patient ID: Becky Wolfe,  MRN: [id number]  PCP: Mayra Neer, MD  Date of Evaluation: 09/13/2020 Time spent:20 minutes  Chief Complaint:  Chief Complaint    Anxiety; Depression      HISTORY/CURRENT STATUS: HPI For routine f/u  Three months ago, we discussed her taking the Zoloft consistently. She has been taking it daily and it's helped tremendously. Is more aware of her mood and how to help control the anxiety before it gets bad. The Klonopin helps a lot. She's able to do more things without feeling so drained.  She is not isolating.  Does continue to cry easily depending on the situation.  Sleeps a little better right now.  Using the Klonopin when needed.  No suicidal or homicidal thoughts.  The anxiety is a lot better.  She still needs the Klonopin daily.  It is still effective.  She is not having panic attacks as much right now but more so a generalized sense of unease.  Patient denies increased energy with decreased need for sleep, no increased talkativeness, no racing thoughts, no impulsivity or risky behaviors, no increased spending, no increased libido, no grandiosity, no increased irritability or anger, and no hallucinations.  Denies dizziness, syncope, seizures, numbness, tingling, tremor, tics, unsteady gait, slurred speech, confusion. Denies muscle or joint pain, stiffness, or dystonia.  Individual Medical History/ Review of Systems: Changes? :No    Past medications for mental health diagnoses include: Klonopin, Zoloft, Paxil, Celexa, Ativan, Ambien caused sleep talking  Allergies: Lodine [etodolac] and Ambien [zolpidem tartrate]  Current Medications:  Current Outpatient Medications:  .  aspirin 81 MG chewable tablet, Chew 81 mg by mouth daily., Disp: , Rfl:  .  clonazePAM (KLONOPIN) 0.5 MG tablet, Take 1 tablet (0.5 mg total) by mouth 2 (two) times daily as needed for anxiety., Disp: 60 tablet, Rfl: 1 .  cloNIDine (CATAPRES) 0.1 MG  tablet, Take 0.1 mg by mouth 2 (two) times daily. , Disp: , Rfl:  .  cyclobenzaprine (FLEXERIL) 5 MG tablet, Take 1 tablet (5 mg total) by mouth every 8 (eight) hours as needed for muscle spasms., Disp: 90 tablet, Rfl: 1 .  gabapentin (NEURONTIN) 800 MG tablet, Take 800 mg by mouth 3 (three) times daily., Disp: , Rfl: 1 .  lamoTRIgine (LAMICTAL) 25 MG tablet, One po qd x 5 d then one po bid x 5d then one po tid x 5d then 2 po bid (Patient taking differently: 100 mg 2 (two) times daily. ), Disp: 120 tablet, Rfl: 5 .  Tapentadol HCl (NUCYNTA) 100 MG TABS, Take 100 mg by mouth in the morning and at bedtime. Takes twice a day for breakthrough pain. Also takes Nucynta 150mg  ER daily, Disp: , Rfl:  .  sertraline (ZOLOFT) 100 MG tablet, Take 1.5 tablets (150 mg total) by mouth daily., Disp: 135 tablet, Rfl: 0 .  Tapentadol HCl (NUCYNTA ER) 150 MG TB12, Take 1 tablet by mouth daily. (Patient not taking: Reported on 09/13/2020), Disp: , Rfl:  Medication Side Effects: none  Family Medical/ Social History: Changes? No  MENTAL HEALTH EXAM:  Last menstrual period 10/16/2011.There is no height or weight on file to calculate BMI.  General Appearance: Casual, Neat and Well Groomed  Eye Contact:  Good  Speech:  Clear and Coherent and Normal Rate  Volume:  Normal  Mood:  Euthymic  Affect:  Appropriate  Thought Process:  Goal Directed and Descriptions of Associations: Intact  Orientation:  Full (Time, Place, and Person)  Thought Content: Logical   Suicidal Thoughts:  No  Homicidal Thoughts:  No  Memory:  WNL  Judgement:  Good  Insight:  Good  Psychomotor Activity:  Normal  Concentration:  Concentration: Good  Recall:  Good  Fund of Knowledge: Good  Language: Good  Assets:  Desire for Improvement  ADL's:  Intact  Cognition: WNL  Prognosis:  Good    DIAGNOSES:    ICD-10-CM   1. Generalized anxiety disorder  F41.1   2. Mild episode of recurrent major depressive disorder (HCC)  F33.0   3. Insomnia  due to other mental disorder  F51.05    F99     Receiving Psychotherapy: No    RECOMMENDATIONS:  PDMP was reviewed. I provided 20 minutes of face-to-face time during this encounter. I am glad to see she is doing better! Discussed her symptoms.  I believe she is quite a bit better but I think she can improve even more.  I recommend increasing the Zoloft. Increase Zoloft to 150 mg p.o. daily. Continue Klonopin 0.5 mg, 1/2-1 p.o. twice daily as needed. Refer for counseling.  She understands that she should call for the appointment.  I gave her several different names including [facility], [facility] and [facility]. Return in 4 to 6 weeks.   Donnal Moat, PA-C

## 2020-10-03 ENCOUNTER — Telehealth: Payer: Self-pay | Admitting: Physician Assistant

## 2020-10-03 NOTE — Telephone Encounter (Signed)
Yes I would really appreciate it if you would.  Thanks

## 2020-10-03 NOTE — Telephone Encounter (Signed)
Pt called and left a message stating that she would like Becky Wolfe to give her a call she has a question about her visit. So please give her a call at [phone]

## 2020-10-03 NOTE — Telephone Encounter (Signed)
Is that okay if I call her or ?

## 2020-10-04 NOTE — Telephone Encounter (Signed)
Rtc to patient and she was just confused on why you were referring her out of practice for counseling. I explained it was probably because our practice is too booked up and it may be a long wait. She said it's taken so much just to get to the point to even call to set up apts and just feels like she keeps getting moved back further and further. She said she just wants to talk to someone so badly. She had just pulled the phone number on one of the counselors recommended and was going to call.   I informed her I would call her back next week to see if she was able to set up apt. She was appreciative. hu

## 2020-10-04 NOTE — Telephone Encounter (Signed)
Thanks Traci.  Yes, that's the reason. I feel like she can be seen sooner than our usual >3 month for new appts for therapy.

## 2020-10-13 NOTE — Telephone Encounter (Signed)
Tried to reach patient and discuss her follow up but no answer and unable to leave message.

## 2020-10-17 ENCOUNTER — Emergency Department (HOSPITAL_COMMUNITY): Payer: Medicare Other

## 2020-10-17 ENCOUNTER — Other Ambulatory Visit: Payer: Self-pay

## 2020-10-17 ENCOUNTER — Emergency Department (HOSPITAL_COMMUNITY)
Admission: EM | Admit: 2020-10-17 | Discharge: 2020-10-17 | Disposition: A | Discharge status: Home / Self Care | Payer: Medicare Other | Attending: Emergency Medicine | Admitting: Emergency Medicine

## 2020-10-17 DIAGNOSIS — S01412A Laceration without foreign body of left cheek and temporomandibular area, initial encounter: Secondary | ICD-10-CM

## 2020-10-17 DIAGNOSIS — Z23 Encounter for immunization: Secondary | ICD-10-CM | POA: Diagnosis not present

## 2020-10-17 DIAGNOSIS — Z7982 Long term (current) use of aspirin: Secondary | ICD-10-CM | POA: Insufficient documentation

## 2020-10-17 DIAGNOSIS — F17200 Nicotine dependence, unspecified, uncomplicated: Secondary | ICD-10-CM | POA: Insufficient documentation

## 2020-10-17 DIAGNOSIS — S0181XA Laceration without foreign body of other part of head, initial encounter: Secondary | ICD-10-CM | POA: Insufficient documentation

## 2020-10-17 DIAGNOSIS — W182XXA Fall in (into) shower or empty bathtub, initial encounter: Secondary | ICD-10-CM | POA: Insufficient documentation

## 2020-10-17 DIAGNOSIS — S060X0A Concussion without loss of consciousness, initial encounter: Secondary | ICD-10-CM | POA: Diagnosis not present

## 2020-10-17 DIAGNOSIS — Z79899 Other long term (current) drug therapy: Secondary | ICD-10-CM | POA: Diagnosis not present

## 2020-10-17 DIAGNOSIS — S0993XA Unspecified injury of face, initial encounter: Secondary | ICD-10-CM | POA: Diagnosis present

## 2020-10-17 LAB — COMPREHENSIVE METABOLIC PANEL
ALT: 17 U/L (ref 0–44)
AST: 18 U/L (ref 15–41)
Albumin: 3.9 g/dL (ref 3.5–5.0)
Alkaline Phosphatase: 66 U/L (ref 38–126)
Anion gap: 9 (ref 5–15)
BUN: 10 mg/dL (ref 6–20)
CO2: 23 mmol/L (ref 22–32)
Calcium: 9.2 mg/dL (ref 8.9–10.3)
Chloride: 104 mmol/L (ref 98–111)
Creatinine, Ser: 0.85 mg/dL (ref 0.44–1.00)
GFR, Estimated: >60 mL/min (ref >60)
Glucose, Bld: 112 mg/dL — ABNORMAL HIGH (ref 70–99)
Potassium: 3.7 mmol/L (ref 3.5–5.1)
Sodium: 136 mmol/L (ref 135–145)
Total Bilirubin: 0.5 mg/dL (ref 0.3–1.2)
Total Protein: 7.3 g/dL (ref 6.5–8.1)

## 2020-10-17 LAB — CBC
HCT: 38.4 % (ref 36.0–46.0)
Hemoglobin: 13.2 g/dL (ref 12.0–15.0)
MCH: 30.3 pg (ref 26.0–34.0)
MCHC: 34.4 g/dL (ref 30.0–36.0)
MCV: 88.1 fL (ref 80.0–100.0)
Platelets: 211 10*3/uL (ref 150–400)
RBC: 4.36 MIL/uL (ref 3.87–5.11)
RDW: 12.1 % (ref 11.5–15.5)
WBC: 3.5 10*3/uL — ABNORMAL LOW (ref 4.0–10.5)
nRBC: 0 % (ref 0.0–0.2)

## 2020-10-17 LAB — I-STAT BETA HCG BLOOD, ED (MC, WL, AP ONLY): I-stat hCG, quantitative: <5 m[IU]/mL (ref <5)

## 2020-10-17 LAB — CBG MONITORING, ED: Glucose-Capillary: 95 mg/dL (ref 70–99)

## 2020-10-17 MED ORDER — TETANUS-DIPHTH-ACELL PERTUSSIS 5-2.5-18.5 LF-MCG/0.5 IM SUSY
0.5000 mL | PREFILLED_SYRINGE | Freq: Once | INTRAMUSCULAR | Status: AC
  Administered 2020-10-17: 0.5 mL via INTRAMUSCULAR
  Filled 2020-10-17: qty 0.5

## 2020-10-17 MED ORDER — FENTANYL CITRATE (PF) 100 MCG/2ML IJ SOLN
50.0000 ug | Freq: Once | INTRAMUSCULAR | Status: AC
  Administered 2020-10-17: 50 ug via INTRAVENOUS
  Filled 2020-10-17: qty 2

## 2020-10-17 MED ORDER — LIDOCAINE-EPINEPHRINE-TETRACAINE (LET) TOPICAL GEL
3.0000 mL | Freq: Once | TOPICAL | Status: AC
  Administered 2020-10-17: 3 mL via TOPICAL
  Filled 2020-10-17: qty 3

## 2020-10-17 MED ORDER — LIDOCAINE HCL 2 % IJ SOLN
10.0000 mL | Freq: Once | INTRAMUSCULAR | Status: DC
  Filled 2020-10-17: qty 20

## 2020-10-17 NOTE — ED Provider Notes (Signed)
[facility] EMERGENCY DEPARTMENT Provider Note   CSN: [id number] Arrival date & time: 10/17/20  1459     History No chief complaint on file. Fall  Becky Wolfe is a 59 y.o. female.  The history is provided by the patient and medical records. No language interpreter was used.  Fall This is a new problem. The current episode started 1 to 2 hours ago. The problem occurs rarely. The problem has not changed since onset.Associated symptoms include headaches. Pertinent negatives include no chest pain, no abdominal pain and no shortness of breath. Nothing aggravates the symptoms. Nothing relieves the symptoms. She has tried nothing for the symptoms. The treatment provided no relief.       Past Medical History:  Diagnosis Date  . Anxiety 06-16-13   hx. panic attacks, none recent  . Blood transfusion without reported diagnosis    20 yrs ago after childbirth  . Bronchitis    hx of  . Chronic neck pain   . Constipation   . Depression   . DVT (deep venous thrombosis) ([place]) 2015   rt. leg   . Headache(784.0)    migraines-has decreased  . Heart murmur    Dr. Etter Sjogren  . Hepatitis C    dx. Hep. C(s/p transfusion age 48) low Hgb  .-multiple transfusions. Treated successfully  . Multiple sclerosis ([place])    dx. -23yrs ago, then med stopped-due to liver enzymes changes,occ. periods of weakness in arms"d.  was seen at [facility] and tolded that she does not have MSrops things"  . Pneumonia    hx of , ? mild emphysema on CT scan 5'14, multiple pulmonary nodules  . RSD (reflex sympathetic dystrophy) 06-16-13   legs  . RSD (reflex sympathetic dystrophy) 2009  . TIA (transient ischemic attack)    hx of (weakness left side)-none in 1 yr    Patient Active Problem List   Diagnosis Date Noted  . Protruded cervical disc 06/22/2020  . Complex regional pain syndrome type 1 of lower extremity 06/22/2020  . Numbness 12/30/2018  . Cervical myelopathy ([place]) 08/05/2018  . Neck pain  02/19/2018  . History of fusion of cervical spine 02/19/2018  . Urinary hesitancy 02/19/2018  . Chronic non-specific white matter lesions on MRI 02/19/2018  . Accidental overdose 03/07/2016  . Atherosclerosis of native coronary artery of native heart without angina pectoris 02/10/2015  . History of DVT (deep vein thrombosis) 02/10/2015  . Tobacco abuse 02/10/2015  . Right leg pain 04/23/2013  . DVT (deep vein thrombosis) in pregnancy 04/23/2013  . Chronic pain disorder 04/23/2013  . RSD lower limb 04/23/2013  . Chronic anticoagulation 04/23/2013    Past Surgical History:  Procedure Laterality Date  . ABDOMINAL HYSTERECTOMY    . ANAL RECTAL MANOMETRY N/A 04/11/2015   Procedure: ANO RECTAL MANOMETRY;  Surgeon: Arta Silence, MD;  Location: [facility];  Service: Endoscopy;  Laterality: N/A;  . ANAL RECTAL MANOMETRY N/A 05/21/2017   Procedure: ANO RECTAL MANOMETRY;  Surgeon: Arta Silence, MD;  Location: [facility];  Service: Endoscopy;  Laterality: N/A;  . ANKLE SURGERY Right    no retained hardware  . ANTERIOR CERVICAL DECOMP/DISCECTOMY FUSION  09/21/2012   Procedure: ANTERIOR CERVICAL DECOMPRESSION/DISCECTOMY FUSION 2 LEVELS;  Surgeon: Otilio Connors, MD;  Location: [facility];  Service: Neurosurgery;  Laterality: N/A;  Cervical five-six, cervical seven-thoracic one Anterior cervical decompression/diskectomy/fusion/Lifenet bone/trestle plate/Prior Cervical six--seven Anterior cervical fusion/Removing trestle plate  . ANTERIOR CERVICAL DECOMP/DISCECTOMY FUSION N/A 08/05/2018   Procedure:  Cervical four-five Anterior cervical decompression/discectomy/fusion;  Surgeon: Judith Part, MD;  Location: [facility];  Service: Neurosurgery;  Laterality: N/A;  . APPENDECTOMY    . CESAREAN SECTION     twice  . COLONOSCOPY    . ESOPHAGOGASTRODUODENOSCOPY (EGD) WITH PROPOFOL N/A 06/30/2013   Procedure: ESOPHAGOGASTRODUODENOSCOPY (EGD) WITH PROPOFOL;  Surgeon: Arta Silence, MD;  Location: [facility];  Service: Endoscopy;  Laterality: N/A;  . NECK SURGERY    . WEDGE RESECTION     left ovary with cyst removed ,hx. multiple cysts removed prior     OB History   No obstetric history on file.     Family History  Problem Relation Age of Onset  . Heart attack Mother 72       with CABG  . Heart disease Mother   . Hypertension Mother   . Migraines Mother   . Heart attack Father   . Heart disease Father   . Diabetes Mellitus II Father   . Hypertension Father   . Lupus Sister   . Depression Sister   . Migraines Sister   . Renal Disease Brother   . Diabetes Sister   . Hypertension Sister   . Migraines Sister   . Migraines Sister   . Healthy Sister   . Healthy Sister   . Healthy Sister   . Healthy Brother   . Dementia Maternal Grandfather   . Colon cancer Maternal Grandmother   . Heart disease Maternal Grandmother   . Hypertension Maternal Grandmother   . Healthy Son   . Migraines Son     Social History   Tobacco Use  . Smoking status: Current Some Day Smoker    Packs/day: 0.12    Years: 36.00    Pack years: 4.32  . Smokeless tobacco: Never Used  . Tobacco comment: 2 - 3 cigs per day.   Vaping Use  . Vaping Use: Never used  Substance Use Topics  . Alcohol use: No  . Drug use: No    Home Medications Prior to Admission medications   Medication Sig Start Date End Date Taking? Authorizing Provider  aspirin 81 MG chewable tablet Chew 81 mg by mouth daily.    [provider]  clonazePAM (KLONOPIN) 0.5 MG tablet Take 1 tablet (0.5 mg total) by mouth 2 (two) times daily as needed for anxiety. 09/13/20   Donnal Moat T, PA-C  cloNIDine (CATAPRES) 0.1 MG tablet Take 0.1 mg by mouth 2 (two) times daily.     [provider]  cyclobenzaprine (FLEXERIL) 5 MG tablet Take 1 tablet (5 mg total) by mouth every 8 (eight) hours as needed for muscle spasms. 04/03/20   Sater, Nanine Means, MD  gabapentin (NEURONTIN) 800 MG tablet Take 800 mg by mouth 3  (three) times daily. 05/12/17   [provider]  lamoTRIgine (LAMICTAL) 25 MG tablet One po qd x 5 d then one po bid x 5d then one po tid x 5d then 2 po bid Patient taking differently: 100 mg 2 (two) times daily.  06/22/20   Sater, Nanine Means, MD  sertraline (ZOLOFT) 100 MG tablet Take 1.5 tablets (150 mg total) by mouth daily. 09/13/20   Donnal Moat T, PA-C  Tapentadol HCl (NUCYNTA ER) 150 MG TB12 Take 1 tablet by mouth daily. Patient not taking: Reported on 09/13/2020    [provider]  Tapentadol HCl (NUCYNTA) 100 MG TABS Take 100 mg by mouth in the morning and at bedtime. Takes twice a day  for breakthrough pain. Also takes Nucynta 150mg  ER daily    [provider]    Allergies    Lodine [etodolac] and Ambien [zolpidem tartrate]  Review of Systems   Review of Systems  Constitutional: Negative for chills, diaphoresis, fatigue and fever.  HENT: Negative for congestion.   Eyes: Negative for photophobia, pain and visual disturbance.  Respiratory: Negative for chest tightness, shortness of breath and wheezing.   Cardiovascular: Negative for chest pain, palpitations and leg swelling.  Gastrointestinal: Negative for abdominal pain, constipation, diarrhea, nausea and vomiting.  Genitourinary: Negative for dysuria, flank pain and frequency.  Musculoskeletal: Positive for neck pain. Negative for back pain.  Skin: Positive for wound. Negative for rash.  Neurological: Positive for headaches. Negative for dizziness, seizures, facial asymmetry, weakness, light-headedness and numbness.  Psychiatric/Behavioral: Negative for agitation and confusion.  All other systems reviewed and are negative.   Physical Exam Updated Vital Signs BP (!) 142/71 (BP Location: Left Arm)   Pulse (!) 58   Temp 98.9 F (37.2 C) (Oral)   Resp 18   LMP 10/16/2011   SpO2 98%   Physical Exam Vitals and nursing note reviewed.  Constitutional:      General: She is not in acute distress.     Appearance: She is well-developed. She is not ill-appearing, toxic-appearing or diaphoretic.  HENT:     Head: Laceration present.      Comments: Normal smile and extraocular movements.  No evidence of entrapment.  Pupils metric and reactive.  No trismus.  No raccoon eyes or battle sign    Right Ear: External ear normal.     Left Ear: External ear normal.     Nose: Nose normal. No congestion or rhinorrhea.     Mouth/Throat:     Mouth: Mucous membranes are moist.     Pharynx: No oropharyngeal exudate or posterior oropharyngeal erythema.  Eyes:     Conjunctiva/sclera: Conjunctivae normal.     Pupils: Pupils are equal, round, and reactive to light.  Neck:     Vascular: No carotid bruit.  Cardiovascular:     Rate and Rhythm: Normal rate.     Heart sounds: No murmur heard.   Pulmonary:     Effort: Pulmonary effort is normal. No respiratory distress.     Breath sounds: No stridor. No wheezing, rhonchi or rales.  Chest:     Chest wall: No tenderness.  Abdominal:     General: Abdomen is flat. There is no distension.     Tenderness: There is no abdominal tenderness. There is no right CVA tenderness, left CVA tenderness, guarding or rebound.  Musculoskeletal:        General: Tenderness present.     Left wrist: Tenderness present. No snuff box tenderness.     Cervical back: Neck supple. Tenderness present.     Comments: Mild tenderness in the left wrist without any snuffbox tenderness.  Normal grip and, sensation, and range of motion.  Normal pulse and cap refill.  Patient does not want imaging of the wrist at this time.  Skin:    General: Skin is warm.     Coloration: Skin is not pale.     Findings: No erythema or rash.  Neurological:     General: No focal deficit present.     Mental Status: She is alert and oriented to person, place, and time.     Cranial Nerves: No cranial nerve deficit.     Sensory: No sensory deficit.  Motor: No weakness or abnormal muscle tone.     Deep Tendon  Reflexes: Reflexes are normal and symmetric.  Psychiatric:        Mood and Affect: Mood normal.     ED Results / Procedures / Treatments   Labs (all labs ordered are listed, but only abnormal results are displayed) Labs Reviewed  COMPREHENSIVE METABOLIC PANEL - Abnormal; Notable for the following components:      Result Value   Glucose, Bld 112 (*)    All other components within normal limits  CBC - Abnormal; Notable for the following components:   WBC 3.5 (*)    All other components within normal limits  I-STAT BETA HCG BLOOD, ED ([facility], [facility], [facility] ONLY)  CBG MONITORING, ED    EKG None  Radiology CT Head Wo Contrast  Result Date: 10/17/2020 CLINICAL DATA:  Fall, neck pain. EXAM: CT HEAD WITHOUT CONTRAST CT CERVICAL SPINE WITHOUT CONTRAST TECHNIQUE: Multidetector CT imaging of the head and cervical spine was performed following the standard protocol without intravenous contrast. Multiplanar CT image reconstructions of the cervical spine were also generated. COMPARISON:  CT C-spine 06/22/2018, CT head 01/31/2020. MRI cervical spine 04/15/2020. FINDINGS: CT HEAD FINDINGS Brain: Patchy and confluent areas of decreased attenuation are noted throughout the deep and periventricular white matter of the cerebral hemispheres bilaterally, compatible with chronic microvascular ischemic disease. No evidence of large-territorial acute infarction. No parenchymal hemorrhage. No mass lesion. No extra-axial collection. No mass effect or midline shift. No hydrocephalus. Basilar cisterns are patent. Vascular: No hyperdense vessel. Skull: No acute fracture or focal lesion. Sinuses/Orbits: Paranasal sinuses and mastoid air cells are clear. The orbits are unremarkable. Other: None. CT CERVICAL SPINE FINDINGS Alignment: Interval revision of an anterior cervical discectomy and fusion (compared to CT 06/22/2018) now with plate and screw fixation at the C4-C5 and C7-T1 levels. Fractured bilateral screws at the T1 level.  there is straightening of the normal cervical lordosis due to the surgical hardware positioning. No abnormal alignment otherwise. Skull base and vertebrae: No acute fracture. No aggressive appearing focal osseous lesion or focal pathologic process. Soft tissues and spinal canal: No prevertebral fluid or swelling. No visible canal hematoma. Disc levels:  Maintained. Upper chest: Unremarkable. Other: None. IMPRESSION: 1. No acute intracranial abnormality. 2. No acute displaced fracture or traumatic listhesis of the cervical spine in a patient status post anterior cervical discectomy and fusion revision at the C4-C5 and C7-T1 levels with fractured bilateral screws at the T1 level. Age-indeterminate fracture of the surgical hardware with no other acute associated findings. Recommend up-load of recent radiograph or CT for comparison (unable to evaluate the surgical hardware on MRI dating 04/15/2020 for comparison). Electronically Signed   By: Iven Finn M.D.   On: 10/17/2020 16:11   CT CERVICAL SPINE WO CONTRAST  Result Date: 10/17/2020 CLINICAL DATA:  Fall, neck pain. EXAM: CT HEAD WITHOUT CONTRAST CT CERVICAL SPINE WITHOUT CONTRAST TECHNIQUE: Multidetector CT imaging of the head and cervical spine was performed following the standard protocol without intravenous contrast. Multiplanar CT image reconstructions of the cervical spine were also generated. COMPARISON:  CT C-spine 06/22/2018, CT head 01/31/2020. MRI cervical spine 04/15/2020. FINDINGS: CT HEAD FINDINGS Brain: Patchy and confluent areas of decreased attenuation are noted throughout the deep and periventricular white matter of the cerebral hemispheres bilaterally, compatible with chronic microvascular ischemic disease. No evidence of large-territorial acute infarction. No parenchymal hemorrhage. No mass lesion. No extra-axial collection. No mass effect or midline shift. No hydrocephalus. Basilar cisterns  are patent. Vascular: No hyperdense vessel.  Skull: No acute fracture or focal lesion. Sinuses/Orbits: Paranasal sinuses and mastoid air cells are clear. The orbits are unremarkable. Other: None. CT CERVICAL SPINE FINDINGS Alignment: Interval revision of an anterior cervical discectomy and fusion (compared to CT 06/22/2018) now with plate and screw fixation at the C4-C5 and C7-T1 levels. Fractured bilateral screws at the T1 level. there is straightening of the normal cervical lordosis due to the surgical hardware positioning. No abnormal alignment otherwise. Skull base and vertebrae: No acute fracture. No aggressive appearing focal osseous lesion or focal pathologic process. Soft tissues and spinal canal: No prevertebral fluid or swelling. No visible canal hematoma. Disc levels:  Maintained. Upper chest: Unremarkable. Other: None. IMPRESSION: 1. No acute intracranial abnormality. 2. No acute displaced fracture or traumatic listhesis of the cervical spine in a patient status post anterior cervical discectomy and fusion revision at the C4-C5 and C7-T1 levels with fractured bilateral screws at the T1 level. Age-indeterminate fracture of the surgical hardware with no other acute associated findings. Recommend up-load of recent radiograph or CT for comparison (unable to evaluate the surgical hardware on MRI dating 04/15/2020 for comparison). Electronically Signed   By: Iven Finn M.D.   On: 10/17/2020 16:11    Procedures .Marland KitchenLaceration Repair  Date/Time: 10/17/2020 8:03 PM Performed by: Courtney Paris, MD Authorized by: Courtney Paris, MD   Consent:    Consent obtained:  Verbal   Consent given by:  Patient   Risks discussed:  Infection, pain, poor cosmetic result and poor wound healing   Alternatives discussed:  No treatment Anesthesia (see MAR for exact dosages):    Anesthesia method:  Topical application   Topical anesthetic:  LET Laceration details:    Location:  Face   Face location:  L cheek   Length (cm):  1 (stellate  with corners)   Depth (mm):  2 Repair type:    Repair type:  Intermediate Pre-procedure details:    Preparation:  Patient was prepped and draped in usual sterile fashion and imaging obtained to evaluate for foreign bodies Exploration:    Wound exploration: wound explored through full range of motion and entire depth of wound probed and visualized     Contaminated: no   Treatment:    Area cleansed with:  Saline   Amount of cleaning:  Standard   Irrigation solution:  Sterile saline   Irrigation volume:  250   Irrigation method:  Syringe   Visualized foreign bodies/material removed: no   Skin repair:    Repair method:  Sutures   Suture size:  5-0   Suture material:  Fast-absorbing gut   Suture technique:  Simple interrupted   Number of sutures:  6 Approximation:    Approximation:  Close Post-procedure details:    Dressing:  Antibiotic ointment   Patient tolerance of procedure:  Tolerated well, no immediate complications   (including critical care time)  Medications Ordered in ED Medications  Tdap (BOOSTRIX) injection 0.5 mL (0.5 mLs Intramuscular Given 10/17/20 1752)  fentaNYL (SUBLIMAZE) injection 50 mcg (50 mcg Intravenous Given 10/17/20 1749)  lidocaine-EPINEPHrine-tetracaine (LET) topical gel (3 mLs Topical Given 10/17/20 1757)    ED Course  I have reviewed the triage vital signs and the nursing notes.  Pertinent labs & imaging results that were available during my care of the patient were reviewed by me and considered in my medical decision making (see chart for details).    MDM Rules/Calculators/A&P  OLUWATOMISIN HUSTEAD is a 59 y.o. female with a past medical history significant for lower extremity RSD, hepatitis C, anxiety, TIA, MS, and prior cervical spine surgery x2 most recently with Dr. Venetia Constable in 2019 who presents with a fall and head injury and face laceration.  Patient reports that she was in the shower this afternoon when she slipped and  fell hitting her left cheek on the side of the bathtub.  She reports he did not get knocked out but was confused for period of time.  She feels she is having significant headache and neck pain but is feeling better in regards to these mild confusion.  She denies nausea or vomiting or vision changes.  She denies any pain in her chest/abdomen/pelvis.  She denies pain in her arms or legs aside from some mild pain in her left wrist which she is not wanting to get any imaging of this time.  She did sustain laceration to her left cheek that is now hemostatic.  She is unsure of her last tetanus vaccination.  On exam, she had no focal neurologic deficits initially with intact sensation and strength in extremities.  Good sensation and strength in the face with no asymmetry.  She has a 1 cm stellate laceration to her left cheek and zygomatic arch area.  She has intact extraocular movements with no evidence of entrapment.  Pupils are symmetric and reactive.  No reported visual changes.  Patient does have a small gaping wound which will need suture laceration repair.  We will update her tetanus and order lidocaine to be used.  Patient had CT of the head and neck which did not show any intracranial hemorrhage or skull fracture.  The CT of the neck also did not show any evidence of acute fracture or dislocation of her neck but it did show fracture of the hardware and is unclear if this is new or not.  Due to the patient's significant neck pain both in the proximal and distal neck and this possibility of hardware injury, I will call neurosurgery to get their recommendations prior to removing cervical collar.  She denies any neurologic deficits distally.  She will be given some pain medicine and we will take care of her laceration while awaiting neurosurgery call.   5:44 PM Neurosurgery was able to come and pull up previous x-rays of the patient's neck.  It looks like the hardware fractures seen by the radiology team are  chronic and not new today.  Given the lack of other injury seen, neurosurgery doubted there is any acute injury to the neck.  Cervical collar was removed and patient's neck was feeling much better.  Patient would rather do let cream instead of lidocaine so we will order this to be applied while waiting for tetanus update and pain medicine.  Anticipate discharge after laceration repair.  8:02 PM Laceration was repaired.  Patient will follow up with PCP and understands management of her laceration as an outpatient.  She understands return precautions and follow-up instructions and was discharged in good condition.   Final Clinical Impression(s) / ED Diagnoses Final diagnoses:  Fall in shower  Cheek laceration, left, initial encounter  Concussion without loss of consciousness, initial encounter    Rx / DC Orders ED Discharge Orders    None      Clinical Impression: 1. Fall in shower   2. Cheek laceration, left, initial encounter   3. Concussion without loss of consciousness, initial encounter     Disposition:  Discharge  Condition: Good  I have discussed the results, Dx and Tx plan with the pt(& family if present). He/she/they expressed understanding and agree(s) with the plan. Discharge instructions discussed at great length. Strict return precautions discussed and pt &/or family have verbalized understanding of the instructions. No further questions at time of discharge.    New Prescriptions   No medications on file    Follow Up: Mayra Neer, MD [address] [place] [address] [phone]     [facility] EMERGENCY DEPARTMENT [address] [id number] mc [place] [phone]       Leandrea Ackley, Gwenyth Allegra, MD 10/17/20 2006

## 2020-10-17 NOTE — Telephone Encounter (Signed)
Follow up call with patient, she is still having trouble finding a Social worker. While we were on the phone I gave her [facility] which have multiple counselors and also [facility]. She was unable to locate a # for her. She was very appreciative of call. She reports that she finally decided to reach out for help in June and she's still not gotten an apt. Encouraged her the need currently is tremendous and to continue to reach out.

## 2020-10-17 NOTE — ED Triage Notes (Signed)
Pt BIB EMS after mechanical fall in the shower. On EMS arrival GCS 15 but in route became altered and disoriented. Pt c/o neck pain with movement. Lac to L cheek, bleeding controlled. Pt A/O on arrival to the ED, answering questions appropriately. C collar applied by EMS.

## 2020-10-17 NOTE — Discharge Instructions (Signed)
Your imaging today did not show any evidence of acute fracture dislocation or any bleeding in the head.  I did not show any fractures of the skull.  We repaired the laceration after washout and updated your tetanus.  You have absorbable sutures so they will not need to be removed.  Please be careful not to move your cheeks too much and watch for signs and symptoms of infection.  Please keep it clean and do not scrub it.    Given the headache you had earlier and your symptoms of mild confusion, you may have had a mild concussion.  Please follow-up with your primary doctor for this.  If any symptoms change or worsen, please return to the nearest emergency department

## 2020-10-24 ENCOUNTER — Ambulatory Visit (INDEPENDENT_AMBULATORY_CARE_PROVIDER_SITE_OTHER): Payer: Medicare Other | Admitting: Physician Assistant

## 2020-10-24 ENCOUNTER — Encounter: Payer: Self-pay | Admitting: Physician Assistant

## 2020-10-24 ENCOUNTER — Other Ambulatory Visit: Payer: Self-pay

## 2020-10-24 DIAGNOSIS — F99 Mental disorder, not otherwise specified: Secondary | ICD-10-CM | POA: Diagnosis not present

## 2020-10-24 DIAGNOSIS — F331 Major depressive disorder, recurrent, moderate: Secondary | ICD-10-CM

## 2020-10-24 DIAGNOSIS — F411 Generalized anxiety disorder: Secondary | ICD-10-CM

## 2020-10-24 DIAGNOSIS — F5105 Insomnia due to other mental disorder: Secondary | ICD-10-CM

## 2020-10-24 MED ORDER — CLONAZEPAM 0.5 MG PO TABS: 0.5000 mg | ORAL_TABLET | Freq: Two times a day (BID) | ORAL | 2 refills | Status: DC | PRN

## 2020-10-24 NOTE — Progress Notes (Signed)
[facility]  Patient ID: Becky Wolfe,  MRN: [id number]  PCP: Mayra Neer, MD  Date of Evaluation: 10/24/2020 Time spent:20 minutes  Chief Complaint:  Chief Complaint    Anxiety; Depression      HISTORY/CURRENT STATUS: HPI For routine f/u  Three months ago, we discussed her taking the Zoloft consistently. She has been taking it daily but did not increase the dose due to an error of directions on the bottle.  She is still on 100 mg daily.  Is more aware of her mood and how to help control the anxiety before it gets bad.  Rarely has a panic attack now, it is more of a sense of unease like something bad is about to happen.  The Klonopin helps a lot. She's able to do more things without feeling so drained.  She is not isolating.  Does continue to cry easily depending on the situation.  Sleeps a little better right now.  Using the Klonopin when needed.  No suicidal or homicidal thoughts.  She has been unable to see a Social worker.  She did call a few of the people that I had recommended but either they are not taking her insurance or she would have to pay out-of-pocket.  She is not able to do that at this time.  Patient denies increased energy with decreased need for sleep, no increased talkativeness, no racing thoughts, no impulsivity or risky behaviors, no increased spending, no increased libido, no grandiosity, no increased irritability or anger, and no hallucinations.  Denies dizziness, syncope, seizures, numbness, tingling, tremor, tics, unsteady gait, slurred speech, confusion. Denies muscle or joint pain, stiffness, or dystonia.  Individual Medical History/ Review of Systems: Changes? :No    Past medications for mental health diagnoses include: Klonopin, Zoloft, Paxil, Celexa, Ativan, Ambien caused sleep talking  Allergies: Lodine [etodolac] and Ambien [zolpidem tartrate]  Current Medications:  Current Outpatient Medications:  .  aspirin 81 MG chewable tablet,  Chew 81 mg by mouth daily., Disp: , Rfl:  .  cloNIDine (CATAPRES) 0.1 MG tablet, Take 0.1 mg by mouth 2 (two) times daily., Disp: , Rfl:  .  cyclobenzaprine (FLEXERIL) 5 MG tablet, Take 1 tablet (5 mg total) by mouth every 8 (eight) hours as needed for muscle spasms., Disp: 90 tablet, Rfl: 1 .  gabapentin (NEURONTIN) 800 MG tablet, Take 800 mg by mouth 3 (three) times daily., Disp: , Rfl: 1 .  lamoTRIgine (LAMICTAL) 25 MG tablet, One po qd x 5 d then one po bid x 5d then one po tid x 5d then 2 po bid (Patient taking differently: 100 mg 2 (two) times daily.), Disp: 120 tablet, Rfl: 5 .  sertraline (ZOLOFT) 100 MG tablet, Take 1.5 tablets (150 mg total) by mouth daily., Disp: 135 tablet, Rfl: 0 .  Tapentadol HCl (NUCYNTA) 100 MG TABS, Take 100 mg by mouth in the morning and at bedtime. Takes twice a day for breakthrough pain. Also takes Nucynta 150mg  ER daily, Disp: , Rfl:  .  clonazePAM (KLONOPIN) 0.5 MG tablet, Take 1 tablet (0.5 mg total) by mouth 2 (two) times daily as needed for anxiety., Disp: 60 tablet, Rfl: 2 .  Tapentadol HCl (NUCYNTA ER) 150 MG TB12, Take 1 tablet by mouth daily. (Patient not taking: Reported on 10/24/2020), Disp: , Rfl:  Medication Side Effects: none  Family Medical/ Social History: Changes? No  MENTAL HEALTH EXAM:  Last menstrual period 10/16/2011.There is no height or weight on file to calculate BMI.  General  Appearance: Casual, Neat and Well Groomed  Eye Contact:  Good  Speech:  Clear and Coherent and Normal Rate  Volume:  Normal  Mood:  Euthymic  Affect:  Appropriate  Thought Process:  Goal Directed and Descriptions of Associations: Intact  Orientation:  Full (Time, Place, and Person)  Thought Content: Logical   Suicidal Thoughts:  No  Homicidal Thoughts:  No  Memory:  WNL  Judgement:  Good  Insight:  Good  Psychomotor Activity:  Normal  Concentration:  Concentration: Good  Recall:  Good  Fund of Knowledge: Good  Language: Good  Assets:  Desire for  Improvement  ADL's:  Intact  Cognition: WNL  Prognosis:  Good    DIAGNOSES:    ICD-10-CM   1. Major depressive disorder, recurrent episode, moderate (HCC)  F33.1   2. Generalized anxiety disorder  F41.1   3. Insomnia due to other mental disorder  F51.05    F99     Receiving Psychotherapy: No    RECOMMENDATIONS:  PDMP was reviewed. I provided 20 minutes of face-to-face time during this encounter. I recommended that she call [facility] to set up therapy. I am not sure what caused the confusion about increasing the Zoloft.  My directions on the prescription were accurate.  At any rate we discussed her increasing Zoloft even more.  I feel that it will help with the depression and anxiety.  She agrees. Increase Zoloft to 150 mg p.o. daily. Continue Klonopin 0.5 mg, 1/2-1 p.o. twice daily as needed. Continue gabapentin 800 mg, 1 p.o. 3 times daily. Continue clonidine 0.1 mg, 1 p.o. twice daily. Continue Lamictal 100 mg, 1 p.o. twice daily. Return in 4 to 6 weeks.  Donnal Moat, PA-C

## 2020-10-24 NOTE — Patient Instructions (Signed)
On the Zoloft (Sertraline) 100 mg, increase to 1 1/2 pills/day.

## 2020-10-25 ENCOUNTER — Other Ambulatory Visit: Payer: Self-pay | Admitting: Nurse Practitioner

## 2020-10-25 DIAGNOSIS — K7469 Other cirrhosis of liver: Secondary | ICD-10-CM

## 2020-12-12 ENCOUNTER — Encounter: Payer: Self-pay | Admitting: Physician Assistant

## 2020-12-12 ENCOUNTER — Other Ambulatory Visit: Payer: Self-pay

## 2020-12-12 ENCOUNTER — Ambulatory Visit (INDEPENDENT_AMBULATORY_CARE_PROVIDER_SITE_OTHER): Payer: Medicare Other | Admitting: Physician Assistant

## 2020-12-12 DIAGNOSIS — F411 Generalized anxiety disorder: Secondary | ICD-10-CM

## 2020-12-12 DIAGNOSIS — F331 Major depressive disorder, recurrent, moderate: Secondary | ICD-10-CM

## 2020-12-12 DIAGNOSIS — F5105 Insomnia due to other mental disorder: Secondary | ICD-10-CM | POA: Diagnosis not present

## 2020-12-12 DIAGNOSIS — F99 Mental disorder, not otherwise specified: Secondary | ICD-10-CM | POA: Diagnosis not present

## 2020-12-12 MED ORDER — SERTRALINE HCL 100 MG PO TABS: 150.0000 mg | ORAL_TABLET | Freq: Every day | ORAL | 0 refills | Status: DC

## 2020-12-12 NOTE — Patient Instructions (Signed)
On the Zoloft 100 mg, increase it to 1.5 pills daily.

## 2020-12-12 NOTE — Progress Notes (Signed)
[facility]  Patient ID: Becky Wolfe,  MRN: [id number]  PCP: Mayra Neer, MD  Date of Evaluation: 12/12/2020 Time spent:20 minutes  Chief Complaint:  Chief Complaint    Anxiety; Depression      HISTORY/CURRENT STATUS: HPI For routine f/u  Patient is 45 minutes late, I was able to work her in.   Very vague in her answers.  States she has not been feeling any better than she did and may have even "taken a few steps back."  When going over her medications, she tells me again that she did not increase the Zoloft as has been recommended the past 2 visits.  After the last visit, I was very clear telling her to take the Zoloft 150 mg total, no matter what it says on the pill bottle.  The last prescription sent in November was prescribed for a total of 150 mg with a quantity of 135 pills for 3 months.  She tells me today that on the bottle "it still says to take 1 pill.  I am so much of a rule follower I only take what the bottle says."  She understands again that she is supposed to go by what she and I agree on as concerning that medication.  After questioning several times in different ways, I am able to ascertain she is tired a lot and does not want to do anything.  She only wants to stay home.  She does not sleep all the time.  She does not cry easily.  She really does not enjoy anything.  Personal hygiene is normal.  Appetite and weight are stable.  Denies suicidal or homicidal thoughts.  The anxiety is controlled.  She does need the Klonopin daily and it is helpful.  She does have trouble falling asleep sometimes but it is not severe.  She is able to stay asleep most nights once she does fall asleep.  Patient denies increased energy with decreased need for sleep, no increased talkativeness, no racing thoughts, no impulsivity or risky behaviors, no increased spending, no increased libido, no grandiosity, no increased irritability or anger, and no hallucinations.  Denies  dizziness, syncope, seizures, numbness, tingling, tremor, tics, unsteady gait, slurred speech, confusion. Denies muscle or joint pain, stiffness, or dystonia.  Individual Medical History/ Review of Systems: Changes? :No    Past medications for mental health diagnoses include: Klonopin, Zoloft, Paxil, Celexa, Ativan, Ambien caused sleep talking, Lamictal she preferred not to take  Allergies: Lodine [etodolac] and Ambien [zolpidem tartrate]  Current Medications:  Current Outpatient Medications:  .  aspirin 81 MG chewable tablet, Chew 81 mg by mouth daily., Disp: , Rfl:  .  clonazePAM (KLONOPIN) 0.5 MG tablet, Take 1 tablet (0.5 mg total) by mouth 2 (two) times daily as needed for anxiety., Disp: 60 tablet, Rfl: 2 .  cloNIDine (CATAPRES) 0.1 MG tablet, Take 0.1 mg by mouth 2 (two) times daily., Disp: , Rfl:  .  gabapentin (NEURONTIN) 800 MG tablet, Take 800 mg by mouth 3 (three) times daily., Disp: , Rfl: 1 .  Tapentadol HCl (NUCYNTA) 100 MG TABS, Take 100 mg by mouth in the morning and at bedtime. Takes twice a day for breakthrough pain. Also takes Nucynta 150mg  ER daily, Disp: , Rfl:  .  cyclobenzaprine (FLEXERIL) 5 MG tablet, Take 1 tablet (5 mg total) by mouth every 8 (eight) hours as needed for muscle spasms. (Patient not taking: Reported on 12/12/2020), Disp: 90 tablet, Rfl: 1 .  sertraline (ZOLOFT)  100 MG tablet, Take 1.5 tablets (150 mg total) by mouth daily., Disp: 135 tablet, Rfl: 0 .  Tapentadol HCl (NUCYNTA ER) 150 MG TB12, Take 1 tablet by mouth daily. (Patient not taking: Reported on 12/12/2020), Disp: , Rfl:  Medication Side Effects: none  Family Medical/ Social History: Changes? No  MENTAL HEALTH EXAM:  Last menstrual period 10/16/2011.There is no height or weight on file to calculate BMI.  General Appearance: Casual, Neat and Well Groomed  Eye Contact:  Good  Speech:  Clear and Coherent and Normal Rate  Volume:  Normal  Mood:  Euthymic  Affect:  Appropriate  Thought Process:   Goal Directed and Descriptions of Associations: Intact  Orientation:  Full (Time, Place, and Person)  Thought Content: Logical   Suicidal Thoughts:  No  Homicidal Thoughts:  No  Memory:  WNL  Judgement:  Fair  Insight:  Fair  Psychomotor Activity:  Normal  Concentration:  Concentration: Good and Attention Span: Good  Recall:  Good  Fund of Knowledge: Good  Language: Good  Assets:  Desire for Improvement  ADL's:  Intact  Cognition: WNL  Prognosis:  Good    DIAGNOSES:    ICD-10-CM   1. Major depressive disorder, recurrent episode, moderate (HCC)  F33.1   2. Generalized anxiety disorder  F41.1   3. Insomnia due to other mental disorder  F51.05    F99     Receiving Psychotherapy: No    RECOMMENDATIONS:  PDMP was reviewed. I provided 20 minutes of face-to-face time during this encounter, reminding her to call our office if there is any confusion about the medication.  She was told this at the last visit and seemed to understand and agree.  However we are back at the same place we were 6 weeks ago, with the anxiety and depression no better, although I do not believe worse, because she did not increase the dose of Zoloft as directed.  She was made aware that the main purpose of this appointment was to evaluate her response to the higher dose of Zoloft.  So I do not expect any changes.  We will attempt once again for her to increase the Zoloft 100 mg, 1.5 pills daily, and I have resent that prescription with the correct dose and quantity of pills for 90 days. She has "paperwork to fill out" for [facility] to see a counselor. Increase Zoloft to 150 mg p.o. daily. Continue Klonopin 0.5 mg, 1/2-1 p.o. twice daily as needed. Continue gabapentin 800 mg, 1 p.o. 3 times daily. Continue clonidine 0.1 mg, 1 p.o. twice daily. Return in  6 weeks.  Donnal Moat, PA-C

## 2020-12-27 DIAGNOSIS — M549 Dorsalgia, unspecified: Secondary | ICD-10-CM | POA: Diagnosis not present

## 2020-12-27 DIAGNOSIS — M79661 Pain in right lower leg: Secondary | ICD-10-CM | POA: Diagnosis not present

## 2020-12-27 DIAGNOSIS — G90523 Complex regional pain syndrome I of lower limb, bilateral: Secondary | ICD-10-CM | POA: Diagnosis not present

## 2020-12-27 DIAGNOSIS — G90522 Complex regional pain syndrome I of left lower limb: Secondary | ICD-10-CM | POA: Diagnosis not present

## 2020-12-27 DIAGNOSIS — M961 Postlaminectomy syndrome, not elsewhere classified: Secondary | ICD-10-CM | POA: Diagnosis not present

## 2020-12-27 DIAGNOSIS — C419 Malignant neoplasm of bone and articular cartilage, unspecified: Secondary | ICD-10-CM | POA: Diagnosis not present

## 2020-12-27 DIAGNOSIS — M79604 Pain in right leg: Secondary | ICD-10-CM | POA: Diagnosis not present

## 2020-12-27 DIAGNOSIS — Z79899 Other long term (current) drug therapy: Secondary | ICD-10-CM | POA: Diagnosis not present

## 2020-12-27 DIAGNOSIS — D869 Sarcoidosis, unspecified: Secondary | ICD-10-CM | POA: Diagnosis not present

## 2020-12-27 DIAGNOSIS — R52 Pain, unspecified: Secondary | ICD-10-CM | POA: Diagnosis not present

## 2020-12-27 DIAGNOSIS — G894 Chronic pain syndrome: Secondary | ICD-10-CM | POA: Diagnosis not present

## 2021-01-02 ENCOUNTER — Ambulatory Visit
Admission: RE | Admit: 2021-01-02 | Discharge: 2021-01-02 | Disposition: A | Discharge status: Home / Self Care | Payer: Medicare Other | Source: Ambulatory Visit | Attending: Nurse Practitioner | Admitting: Nurse Practitioner

## 2021-01-02 DIAGNOSIS — K7460 Unspecified cirrhosis of liver: Secondary | ICD-10-CM | POA: Diagnosis not present

## 2021-01-02 DIAGNOSIS — K7469 Other cirrhosis of liver: Secondary | ICD-10-CM

## 2021-01-12 DIAGNOSIS — H0014 Chalazion left upper eyelid: Secondary | ICD-10-CM | POA: Diagnosis not present

## 2021-01-12 DIAGNOSIS — H2513 Age-related nuclear cataract, bilateral: Secondary | ICD-10-CM | POA: Diagnosis not present

## 2021-01-12 DIAGNOSIS — G43809 Other migraine, not intractable, without status migrainosus: Secondary | ICD-10-CM | POA: Diagnosis not present

## 2021-01-12 DIAGNOSIS — H35433 Paving stone degeneration of retina, bilateral: Secondary | ICD-10-CM | POA: Diagnosis not present

## 2021-01-16 DIAGNOSIS — K7460 Unspecified cirrhosis of liver: Secondary | ICD-10-CM | POA: Diagnosis not present

## 2021-01-16 DIAGNOSIS — G9050 Complex regional pain syndrome I, unspecified: Secondary | ICD-10-CM | POA: Diagnosis not present

## 2021-01-16 DIAGNOSIS — I2510 Atherosclerotic heart disease of native coronary artery without angina pectoris: Secondary | ICD-10-CM | POA: Diagnosis not present

## 2021-01-16 DIAGNOSIS — Z8619 Personal history of other infectious and parasitic diseases: Secondary | ICD-10-CM | POA: Diagnosis not present

## 2021-01-16 DIAGNOSIS — I10 Essential (primary) hypertension: Secondary | ICD-10-CM | POA: Diagnosis not present

## 2021-01-16 DIAGNOSIS — D869 Sarcoidosis, unspecified: Secondary | ICD-10-CM | POA: Diagnosis not present

## 2021-01-16 DIAGNOSIS — E041 Nontoxic single thyroid nodule: Secondary | ICD-10-CM | POA: Diagnosis not present

## 2021-01-16 DIAGNOSIS — G43109 Migraine with aura, not intractable, without status migrainosus: Secondary | ICD-10-CM | POA: Diagnosis not present

## 2021-01-16 DIAGNOSIS — Z0000 Encounter for general adult medical examination without abnormal findings: Secondary | ICD-10-CM | POA: Diagnosis not present

## 2021-01-16 DIAGNOSIS — Z720 Tobacco use: Secondary | ICD-10-CM | POA: Diagnosis not present

## 2021-01-16 DIAGNOSIS — G4700 Insomnia, unspecified: Secondary | ICD-10-CM | POA: Diagnosis not present

## 2021-01-17 ENCOUNTER — Other Ambulatory Visit (HOSPITAL_COMMUNITY): Payer: Self-pay | Admitting: Family Medicine

## 2021-01-17 DIAGNOSIS — R011 Cardiac murmur, unspecified: Secondary | ICD-10-CM

## 2021-01-25 DIAGNOSIS — R1310 Dysphagia, unspecified: Secondary | ICD-10-CM | POA: Diagnosis not present

## 2021-01-29 DIAGNOSIS — R309 Painful micturition, unspecified: Secondary | ICD-10-CM | POA: Diagnosis not present

## 2021-01-29 DIAGNOSIS — R300 Dysuria: Secondary | ICD-10-CM | POA: Diagnosis not present

## 2021-02-02 ENCOUNTER — Encounter: Payer: Self-pay | Admitting: Physician Assistant

## 2021-02-02 ENCOUNTER — Other Ambulatory Visit: Payer: Self-pay

## 2021-02-02 ENCOUNTER — Ambulatory Visit (INDEPENDENT_AMBULATORY_CARE_PROVIDER_SITE_OTHER): Payer: Medicare Other | Admitting: Physician Assistant

## 2021-02-02 DIAGNOSIS — F330 Major depressive disorder, recurrent, mild: Secondary | ICD-10-CM | POA: Diagnosis not present

## 2021-02-02 DIAGNOSIS — F5105 Insomnia due to other mental disorder: Secondary | ICD-10-CM | POA: Diagnosis not present

## 2021-02-02 DIAGNOSIS — F411 Generalized anxiety disorder: Secondary | ICD-10-CM

## 2021-02-02 DIAGNOSIS — F99 Mental disorder, not otherwise specified: Secondary | ICD-10-CM | POA: Diagnosis not present

## 2021-02-02 MED ORDER — SERTRALINE HCL 100 MG PO TABS: 200.0000 mg | ORAL_TABLET | Freq: Every day | ORAL | 0 refills | Status: DC

## 2021-02-02 NOTE — Patient Instructions (Signed)
On the Zoloft 100 mg, start taking 2 pills daily. (This is an increase from the 150 mg you now take.)

## 2021-02-02 NOTE — Progress Notes (Signed)
[facility]  Patient ID: MEOSHIA BILLING,  MRN: [id number]  PCP: Mayra Neer, MD  Date of Evaluation: 02/02/2021 Time spent:20 minutes  Chief Complaint:  Chief Complaint    Anxiety; Depression      HISTORY/CURRENT STATUS: For 6 week med check.  Increased Zoloft last visit, after not increasing as directed for the previous 2 visits. States it hasn't helped at all. Still feels sad, energy and motivation are low. Wants to see a therapist and has called [facility]. "They only do telehealth and I don't think that's in my best interest." Worried about privacy at her home, not through their office.  Does not work outside the home. The anxiety is fairly controlled. She does need the Klonopin daily and it is helpful.  States she is sleeping well.  Feels rested when she gets up.  Does not cry easily.  No suicidal or homicidal thoughts.  Patient denies increased energy with decreased need for sleep, no increased talkativeness, no racing thoughts, no impulsivity or risky behaviors, no increased spending, no increased libido, no grandiosity, no increased irritability or anger, no paranoia, and no hallucinations.  Denies dizziness, syncope, seizures, numbness, tingling, tremor, tics, unsteady gait, slurred speech, confusion. Denies muscle or joint pain, stiffness, or dystonia.  Individual Medical History/ Review of Systems: Changes? :No    Past medications for mental health diagnoses include: Klonopin, Zoloft, Paxil, Celexa, Ativan, Ambien caused sleep talking, Lamictal she preferred not to take  Allergies: Lodine [etodolac] and Ambien [zolpidem tartrate]  Current Medications:  Current Outpatient Medications:  .  aspirin 81 MG chewable tablet, Chew 81 mg by mouth daily., Disp: , Rfl:  .  clonazePAM (KLONOPIN) 0.5 MG tablet, Take 1 tablet (0.5 mg total) by mouth 2 (two) times daily as needed for anxiety., Disp: 60 tablet, Rfl: 2 .  cloNIDine (CATAPRES) 0.1 MG tablet, Take 0.1 mg by  mouth 2 (two) times daily., Disp: , Rfl:  .  gabapentin (NEURONTIN) 800 MG tablet, Take 800 mg by mouth 3 (three) times daily., Disp: , Rfl: 1 .  Tapentadol HCl (NUCYNTA) 100 MG TABS, Take 100 mg by mouth in the morning and at bedtime. Takes twice a day for breakthrough pain. Also takes Nucynta 150mg  ER daily, Disp: , Rfl:  .  cyclobenzaprine (FLEXERIL) 5 MG tablet, Take 1 tablet (5 mg total) by mouth every 8 (eight) hours as needed for muscle spasms. (Patient not taking: No sig reported), Disp: 90 tablet, Rfl: 1 .  erythromycin ophthalmic ointment, erythromycin 5 mg/gram (0.5 %) eye ointment  APPLY 1 THIN LAYER TO THE EYELIDS TWICE DAILY, Disp: , Rfl:  .  sertraline (ZOLOFT) 100 MG tablet, Take 2 tablets (200 mg total) by mouth daily., Disp: 180 tablet, Rfl: 0 .  Tapentadol HCl (NUCYNTA ER) 150 MG TB12, Take 1 tablet by mouth daily. (Patient not taking: No sig reported), Disp: , Rfl:  Medication Side Effects: none  Family Medical/ Social History: Changes? No  MENTAL HEALTH EXAM:  Last menstrual period 10/16/2011.There is no height or weight on file to calculate BMI.  General Appearance: Casual, Neat and Well Groomed  Eye Contact:  Good  Speech:  Clear and Coherent and Normal Rate  Volume:  Normal  Mood:  Euthymic  Affect:  Appropriate  Thought Process:  Goal Directed and Descriptions of Associations: Intact  Orientation:  Full (Time, Place, and Person)  Thought Content: Logical   Suicidal Thoughts:  No  Homicidal Thoughts:  No  Memory:  WNL  Judgement:  Fair  Insight:  Fair  Psychomotor Activity:  Normal  Concentration:  Concentration: Good and Attention Span: Good  Recall:  Good  Fund of Knowledge: Good  Language: Good  Assets:  Desire for Improvement  ADL's:  Intact  Cognition: WNL  Prognosis:  Good    DIAGNOSES:    ICD-10-CM   1. Mild episode of recurrent major depressive disorder ([place])  F33.0   2. Generalized anxiety disorder  F41.1   3. Insomnia due to other mental  disorder  F51.05    F99     Receiving Psychotherapy: No    RECOMMENDATIONS:  PDMP was reviewed. I provided 20 minutes of face-to-face time during this encounter, including time spent before and after the visit and records review. She does not want to see a counselor outside of our office, states she feels comfortable here.  I appreciate that.  We will try to get her into see a counselor here as soon as we can however she knows it may be months out and if she needs to see someone before then she should try another group. Increase Zoloft to 200 mg daily.  This was typed out on her instructions on the AVS as well as stressed 3 times to increase this dose.  (She has not followed directions in the past because "the bottle said to take it 1 way and I am such a rule follower I wanted to do it the way the bottle says.") Continue Klonopin 0.5 mg, 1/2-1 p.o. twice daily as needed. Continue gabapentin 800 mg, 1 p.o. 3 times daily. Continue clonidine 0.1 mg, 1 p.o. twice daily. Return in 2 months.  Donnal Moat, PA-C

## 2021-02-05 ENCOUNTER — Other Ambulatory Visit: Payer: Self-pay | Admitting: *Deleted

## 2021-02-08 DIAGNOSIS — Z01812 Encounter for preprocedural laboratory examination: Secondary | ICD-10-CM | POA: Diagnosis not present

## 2021-02-12 DIAGNOSIS — R1310 Dysphagia, unspecified: Secondary | ICD-10-CM | POA: Diagnosis not present

## 2021-02-12 DIAGNOSIS — Z86010 Personal history of colonic polyps: Secondary | ICD-10-CM | POA: Diagnosis not present

## 2021-02-12 DIAGNOSIS — K222 Esophageal obstruction: Secondary | ICD-10-CM | POA: Diagnosis not present

## 2021-02-12 DIAGNOSIS — Z800 Family history of malignant neoplasm of digestive organs: Secondary | ICD-10-CM | POA: Diagnosis not present

## 2021-02-12 DIAGNOSIS — K648 Other hemorrhoids: Secondary | ICD-10-CM | POA: Diagnosis not present

## 2021-02-14 ENCOUNTER — Other Ambulatory Visit (HOSPITAL_COMMUNITY): Payer: Medicare Other

## 2021-02-14 ENCOUNTER — Encounter (HOSPITAL_COMMUNITY): Payer: Self-pay | Admitting: Cardiology

## 2021-02-14 NOTE — Progress Notes (Unsigned)
Patient ID: Becky Wolfe, female   DOB: [birth year], 60 y.o.   MRN: [id number]   Verified appointment "no show" status with Molly at 10:26am .

## 2021-02-21 DIAGNOSIS — M79609 Pain in unspecified limb: Secondary | ICD-10-CM | POA: Diagnosis not present

## 2021-02-21 DIAGNOSIS — M961 Postlaminectomy syndrome, not elsewhere classified: Secondary | ICD-10-CM | POA: Diagnosis not present

## 2021-02-21 DIAGNOSIS — M79661 Pain in right lower leg: Secondary | ICD-10-CM | POA: Diagnosis not present

## 2021-02-21 DIAGNOSIS — Z79899 Other long term (current) drug therapy: Secondary | ICD-10-CM | POA: Diagnosis not present

## 2021-02-21 DIAGNOSIS — C419 Malignant neoplasm of bone and articular cartilage, unspecified: Secondary | ICD-10-CM | POA: Diagnosis not present

## 2021-02-21 DIAGNOSIS — M549 Dorsalgia, unspecified: Secondary | ICD-10-CM | POA: Diagnosis not present

## 2021-02-21 DIAGNOSIS — M5416 Radiculopathy, lumbar region: Secondary | ICD-10-CM | POA: Diagnosis not present

## 2021-02-21 DIAGNOSIS — R52 Pain, unspecified: Secondary | ICD-10-CM | POA: Diagnosis not present

## 2021-02-21 DIAGNOSIS — G90522 Complex regional pain syndrome I of left lower limb: Secondary | ICD-10-CM | POA: Diagnosis not present

## 2021-02-21 DIAGNOSIS — G894 Chronic pain syndrome: Secondary | ICD-10-CM | POA: Diagnosis not present

## 2021-02-21 DIAGNOSIS — M5020 Other cervical disc displacement, unspecified cervical region: Secondary | ICD-10-CM | POA: Diagnosis not present

## 2021-02-21 DIAGNOSIS — Z5181 Encounter for therapeutic drug level monitoring: Secondary | ICD-10-CM | POA: Diagnosis not present

## 2021-02-21 DIAGNOSIS — G90523 Complex regional pain syndrome I of lower limb, bilateral: Secondary | ICD-10-CM | POA: Diagnosis not present

## 2021-02-21 DIAGNOSIS — M79604 Pain in right leg: Secondary | ICD-10-CM | POA: Diagnosis not present

## 2021-03-12 ENCOUNTER — Other Ambulatory Visit: Payer: Self-pay | Admitting: Physician Assistant

## 2021-03-14 NOTE — Telephone Encounter (Signed)
Pt called checking on the status of this reefill

## 2021-03-14 NOTE — Telephone Encounter (Signed)
Controlled substance 

## 2021-03-15 ENCOUNTER — Observation Stay (HOSPITAL_COMMUNITY): Payer: Medicare Other

## 2021-03-15 ENCOUNTER — Other Ambulatory Visit (HOSPITAL_COMMUNITY): Payer: Medicare Other

## 2021-03-15 ENCOUNTER — Observation Stay (HOSPITAL_COMMUNITY)
Admission: EM | Admit: 2021-03-15 | Discharge: 2021-03-16 | Disposition: A | Discharge status: Home / Self Care | Payer: Medicare Other | Attending: Internal Medicine | Admitting: Internal Medicine

## 2021-03-15 ENCOUNTER — Emergency Department (HOSPITAL_COMMUNITY): Payer: Medicare Other

## 2021-03-15 ENCOUNTER — Telehealth: Payer: Self-pay | Admitting: Physician Assistant

## 2021-03-15 ENCOUNTER — Other Ambulatory Visit: Payer: Self-pay

## 2021-03-15 ENCOUNTER — Encounter (HOSPITAL_COMMUNITY): Payer: Self-pay | Admitting: Internal Medicine

## 2021-03-15 DIAGNOSIS — G90529 Complex regional pain syndrome I of unspecified lower limb: Secondary | ICD-10-CM | POA: Diagnosis present

## 2021-03-15 DIAGNOSIS — G894 Chronic pain syndrome: Secondary | ICD-10-CM | POA: Diagnosis not present

## 2021-03-15 DIAGNOSIS — F17210 Nicotine dependence, cigarettes, uncomplicated: Secondary | ICD-10-CM | POA: Diagnosis not present

## 2021-03-15 DIAGNOSIS — R4701 Aphasia: Secondary | ICD-10-CM | POA: Diagnosis not present

## 2021-03-15 DIAGNOSIS — K5900 Constipation, unspecified: Secondary | ICD-10-CM | POA: Diagnosis not present

## 2021-03-15 DIAGNOSIS — Z20822 Contact with and (suspected) exposure to covid-19: Secondary | ICD-10-CM | POA: Insufficient documentation

## 2021-03-15 DIAGNOSIS — G90521 Complex regional pain syndrome I of right lower limb: Secondary | ICD-10-CM | POA: Insufficient documentation

## 2021-03-15 DIAGNOSIS — Z7982 Long term (current) use of aspirin: Secondary | ICD-10-CM | POA: Insufficient documentation

## 2021-03-15 DIAGNOSIS — I2510 Atherosclerotic heart disease of native coronary artery without angina pectoris: Secondary | ICD-10-CM | POA: Insufficient documentation

## 2021-03-15 DIAGNOSIS — R1310 Dysphagia, unspecified: Secondary | ICD-10-CM | POA: Diagnosis not present

## 2021-03-15 DIAGNOSIS — Z8673 Personal history of transient ischemic attack (TIA), and cerebral infarction without residual deficits: Secondary | ICD-10-CM | POA: Diagnosis not present

## 2021-03-15 DIAGNOSIS — G90523 Complex regional pain syndrome I of lower limb, bilateral: Secondary | ICD-10-CM

## 2021-03-15 DIAGNOSIS — R519 Headache, unspecified: Secondary | ICD-10-CM | POA: Diagnosis not present

## 2021-03-15 DIAGNOSIS — J3489 Other specified disorders of nose and nasal sinuses: Secondary | ICD-10-CM | POA: Diagnosis not present

## 2021-03-15 DIAGNOSIS — Z79899 Other long term (current) drug therapy: Secondary | ICD-10-CM | POA: Diagnosis not present

## 2021-03-15 DIAGNOSIS — R4781 Slurred speech: Principal | ICD-10-CM | POA: Diagnosis present

## 2021-03-15 DIAGNOSIS — K649 Unspecified hemorrhoids: Secondary | ICD-10-CM | POA: Diagnosis not present

## 2021-03-15 DIAGNOSIS — R9431 Abnormal electrocardiogram [ECG] [EKG]: Secondary | ICD-10-CM | POA: Diagnosis not present

## 2021-03-15 DIAGNOSIS — I10 Essential (primary) hypertension: Secondary | ICD-10-CM | POA: Diagnosis not present

## 2021-03-15 LAB — COMPREHENSIVE METABOLIC PANEL
ALT: 20 U/L (ref 0–44)
AST: 26 U/L (ref 15–41)
Albumin: 4 g/dL (ref 3.5–5.0)
Alkaline Phosphatase: 79 U/L (ref 38–126)
Anion gap: 9 (ref 5–15)
BUN: 9 mg/dL (ref 6–20)
CO2: 24 mmol/L (ref 22–32)
Calcium: 9.1 mg/dL (ref 8.9–10.3)
Chloride: 108 mmol/L (ref 98–111)
Creatinine, Ser: 0.83 mg/dL (ref 0.44–1.00)
GFR, Estimated: >60 mL/min (ref >60)
Glucose, Bld: 99 mg/dL (ref 70–99)
Potassium: 3.9 mmol/L (ref 3.5–5.1)
Sodium: 141 mmol/L (ref 135–145)
Total Bilirubin: 0.4 mg/dL (ref 0.3–1.2)
Total Protein: 6.9 g/dL (ref 6.5–8.1)

## 2021-03-15 LAB — DIFFERENTIAL
Abs Immature Granulocytes: 0.01 10*3/uL (ref 0.00–0.07)
Basophils Absolute: 0 10*3/uL (ref 0.0–0.1)
Basophils Relative: 1 %
Eosinophils Absolute: 0.1 10*3/uL (ref 0.0–0.5)
Eosinophils Relative: 2 %
Immature Granulocytes: 0 %
Lymphocytes Relative: 38 %
Lymphs Abs: 1.6 10*3/uL (ref 0.7–4.0)
Monocytes Absolute: 0.3 10*3/uL (ref 0.1–1.0)
Monocytes Relative: 7 %
Neutro Abs: 2.2 10*3/uL (ref 1.7–7.7)
Neutrophils Relative %: 52 %

## 2021-03-15 LAB — I-STAT CHEM 8, ED
BUN: 11 mg/dL (ref 6–20)
Calcium, Ion: 1.02 mmol/L — ABNORMAL LOW (ref 1.15–1.40)
Chloride: 108 mmol/L (ref 98–111)
Creatinine, Ser: 0.7 mg/dL (ref 0.44–1.00)
Glucose, Bld: 103 mg/dL — ABNORMAL HIGH (ref 70–99)
HCT: 33 % — ABNORMAL LOW (ref 36.0–46.0)
Hemoglobin: 11.2 g/dL — ABNORMAL LOW (ref 12.0–15.0)
Potassium: 4.7 mmol/L (ref 3.5–5.1)
Sodium: 139 mmol/L (ref 135–145)
TCO2: 27 mmol/L (ref 22–32)

## 2021-03-15 LAB — CBC
HCT: 36.1 % (ref 36.0–46.0)
HCT: 35.6 % — ABNORMAL LOW (ref 36.0–46.0)
Hemoglobin: 12.4 g/dL (ref 12.0–15.0)
Hemoglobin: 12.4 g/dL (ref 12.0–15.0)
MCH: 30 pg (ref 26.0–34.0)
MCH: 29.8 pg (ref 26.0–34.0)
MCHC: 34.3 g/dL (ref 30.0–36.0)
MCHC: 34.8 g/dL (ref 30.0–36.0)
MCV: 87.4 fL (ref 80.0–100.0)
MCV: 85.6 fL (ref 80.0–100.0)
Platelets: 205 10*3/uL (ref 150–400)
Platelets: 190 10*3/uL (ref 150–400)
RBC: 4.13 MIL/uL (ref 3.87–5.11)
RBC: 4.16 MIL/uL (ref 3.87–5.11)
RDW: 12.3 % (ref 11.5–15.5)
RDW: 12.2 % (ref 11.5–15.5)
WBC: 4.2 10*3/uL (ref 4.0–10.5)
WBC: 3.5 10*3/uL — ABNORMAL LOW (ref 4.0–10.5)
nRBC: 0 % (ref 0.0–0.2)
nRBC: 0 % (ref 0.0–0.2)

## 2021-03-15 LAB — PROTIME-INR
INR: 1.1 (ref 0.8–1.2)
Prothrombin Time: 14.6 seconds (ref 11.4–15.2)

## 2021-03-15 LAB — CBG MONITORING, ED: Glucose-Capillary: 78 mg/dL (ref 70–99)

## 2021-03-15 LAB — I-STAT BETA HCG BLOOD, ED (MC, WL, AP ONLY): I-stat hCG, quantitative: <5 m[IU]/mL (ref <5)

## 2021-03-15 LAB — APTT: aPTT: 29 seconds (ref 24–36)

## 2021-03-15 LAB — CREATININE, SERUM
Creatinine, Ser: 0.85 mg/dL (ref 0.44–1.00)
GFR, Estimated: >60 mL/min (ref >60)

## 2021-03-15 LAB — HIV ANTIBODY (ROUTINE TESTING W REFLEX): HIV Screen 4th Generation wRfx: NONREACTIVE

## 2021-03-15 MED ORDER — TAPENTADOL HCL ER 150 MG PO TB12: 1.0000 | ORAL_TABLET | Freq: Every day | ORAL | Status: DC

## 2021-03-15 MED ORDER — GABAPENTIN 400 MG PO CAPS
800.0000 mg | ORAL_CAPSULE | Freq: Three times a day (TID) | ORAL | Status: DC
  Administered 2021-03-15: 800 mg via ORAL
  Filled 2021-03-15 (×2): qty 2

## 2021-03-15 MED ORDER — ACETAMINOPHEN 160 MG/5ML PO SOLN: 650.0000 mg | ORAL | Status: DC | PRN

## 2021-03-15 MED ORDER — ACETAMINOPHEN 650 MG RE SUPP: 650.0000 mg | RECTAL | Status: DC | PRN

## 2021-03-15 MED ORDER — CLONAZEPAM 0.5 MG PO TABS
0.5000 mg | ORAL_TABLET | Freq: Two times a day (BID) | ORAL | Status: DC
  Administered 2021-03-15 – 2021-03-16 (×2): 0.5 mg via ORAL
  Filled 2021-03-15 (×2): qty 1

## 2021-03-15 MED ORDER — ERYTHROMYCIN 5 MG/GM OP OINT
TOPICAL_OINTMENT | Freq: Two times a day (BID) | OPHTHALMIC | Status: DC
  Filled 2021-03-15: qty 3.5

## 2021-03-15 MED ORDER — SERTRALINE HCL 100 MG PO TABS
200.0000 mg | ORAL_TABLET | Freq: Every day | ORAL | Status: DC
  Administered 2021-03-16: 200 mg via ORAL
  Filled 2021-03-15: qty 2

## 2021-03-15 MED ORDER — STROKE: EARLY STAGES OF RECOVERY BOOK
Freq: Once | Status: AC
  Filled 2021-03-15: qty 1

## 2021-03-15 MED ORDER — ASPIRIN 81 MG PO CHEW
81.0000 mg | CHEWABLE_TABLET | Freq: Every day | ORAL | Status: DC
  Administered 2021-03-16: 81 mg via ORAL
  Filled 2021-03-15: qty 1

## 2021-03-15 MED ORDER — ACETAMINOPHEN 325 MG PO TABS: 650.0000 mg | ORAL_TABLET | ORAL | Status: DC | PRN

## 2021-03-15 MED ORDER — OXYCODONE HCL 5 MG PO TABS
10.0000 mg | ORAL_TABLET | ORAL | Status: DC | PRN
Start: 2021-03-15 — End: 2021-03-16
  Administered 2021-03-15 – 2021-03-16 (×3): 10 mg via ORAL
  Filled 2021-03-15 (×3): qty 2

## 2021-03-15 MED ORDER — SODIUM CHLORIDE 0.9% FLUSH: 3.0000 mL | Freq: Once | INTRAVENOUS | Status: DC

## 2021-03-15 MED ORDER — CYCLOBENZAPRINE HCL 10 MG PO TABS
5.0000 mg | ORAL_TABLET | Freq: Three times a day (TID) | ORAL | Status: DC | PRN
  Administered 2021-03-16: 5 mg via ORAL
  Filled 2021-03-15: qty 1

## 2021-03-15 MED ORDER — ENOXAPARIN SODIUM 40 MG/0.4ML IJ SOSY
40.0000 mg | PREFILLED_SYRINGE | Freq: Every day | INTRAMUSCULAR | Status: DC
  Administered 2021-03-15: 40 mg via SUBCUTANEOUS
  Filled 2021-03-15: qty 0.4

## 2021-03-15 MED ORDER — LORAZEPAM 2 MG/ML IJ SOLN
1.0000 mg | Freq: Once | INTRAMUSCULAR | Status: AC
  Administered 2021-03-15: 1 mg via INTRAVENOUS
  Filled 2021-03-15: qty 1

## 2021-03-15 MED ORDER — SODIUM CHLORIDE 0.9 % IV SOLN: INTRAVENOUS | Status: DC

## 2021-03-15 MED ORDER — SENNOSIDES-DOCUSATE SODIUM 8.6-50 MG PO TABS: 1.0000 | ORAL_TABLET | Freq: Every evening | ORAL | Status: DC | PRN

## 2021-03-15 NOTE — Progress Notes (Signed)
Pt arrived to 2 [address] 09. Alert and oriented x4, VS stable, no signs of acute distress. Pt denied SOB and chest pain. Identified appropriately. Pt husband at the bedside. She was placed on cardiac monitor and full assessment completed. Pt oriented to room and equipment, instructed to use call bell for assistance, and call bell left within pt reach. Bed alarm activated, and stroke education provided.  Will continue to monitor and treat pt per MD order.

## 2021-03-15 NOTE — Telephone Encounter (Signed)
Next visit is 04/17/21. Her husband called to request a refill on her Clonazepam called to:  [facility] [place] [address] Phone:  [phone]  Fax:  [phone]     I forwarded a message to Helene Kelp that Becky Wolfe is in the hospital now per her spouse for a possible stroke. She is currently getting tests now.

## 2021-03-15 NOTE — H&P (Signed)
History and Physical    Becky Wolfe [id number] DOB: August 19, 1961 DOA: 03/15/2021  PCP: Mayra Neer, MD  Patient coming from: home  I have personally briefly reviewed patient's old medical records in [place]  Chief Complaint: slurred speech and bitting of her cheek  HPI: Becky Wolfe is a 60 y.o. female with medical history significant of RSD over right lower extremity, chronic pain narcotics essential hypertension comes into the hospital for slurred speech and biting of the tongue that began the night prior to admission and lasted more than an hour as per the husband he relates she could not get her words out as she was having trouble finding words, her husband apparently noticed some left-sided facial droop, which is now improved.  She denies any headache weakness in her arms or legs no trouble swallowing.  He also relates that she was biting on her left cheek, which she relates is because her mouth was dry.  She did not lose control of her sphincters and she was awake alert and oriented during this time.   ED Course:  All her vitals are stable CT scan of the head showed no acute intracranial finding except for chronic's small microvascular changes stable electrolytes are stable as well as blood pressure.  Review of Systems: As per HPI otherwise all other systems reviewed and are negative.   Past Medical History:  Diagnosis Date  . Anxiety 06-16-13   hx. panic attacks, none recent  . Blood transfusion without reported diagnosis    20 yrs ago after childbirth  . Bronchitis    hx of  . Chronic neck pain   . Constipation   . Depression   . DVT (deep venous thrombosis) ([place]) 2015   rt. leg   . Headache(784.0)    migraines-has decreased  . Heart murmur    Dr. Etter Sjogren  . Hepatitis C    dx. Hep. C(s/p transfusion age 26) low Hgb  .-multiple transfusions. Treated successfully  . Multiple sclerosis ([place])    dx. -28yrs ago, then med stopped-due to liver enzymes  changes,occ. periods of weakness in arms"d.  was [facility] at [facility] and tolded that she does not have MSrops things"  . Pneumonia    hx of , ? mild emphysema on CT scan 5'14, multiple pulmonary nodules  . RSD (reflex sympathetic dystrophy) 06-16-13   legs  . RSD (reflex sympathetic dystrophy) 2009  . TIA (transient ischemic attack)    hx of (weakness left side)-none in 1 yr    Past Surgical History:  Procedure Laterality Date  . ABDOMINAL HYSTERECTOMY    . ANAL RECTAL MANOMETRY N/A 04/11/2015   Procedure: ANO RECTAL MANOMETRY;  Surgeon: Arta Silence, MD;  Location: [facility];  Service: Endoscopy;  Laterality: N/A;  . ANAL RECTAL MANOMETRY N/A 05/21/2017   Procedure: ANO RECTAL MANOMETRY;  Surgeon: Arta Silence, MD;  Location: [facility];  Service: Endoscopy;  Laterality: N/A;  . ANKLE SURGERY Right    no retained hardware  . ANTERIOR CERVICAL DECOMP/DISCECTOMY FUSION  09/21/2012   Procedure: ANTERIOR CERVICAL DECOMPRESSION/DISCECTOMY FUSION 2 LEVELS;  Surgeon: Otilio Connors, MD;  Location: [facility];  Service: Neurosurgery;  Laterality: N/A;  Cervical five-six, cervical seven-thoracic one Anterior cervical decompression/diskectomy/fusion/Lifenet bone/trestle plate/Prior Cervical six--seven Anterior cervical fusion/Removing trestle plate  . ANTERIOR CERVICAL DECOMP/DISCECTOMY FUSION N/A 08/05/2018   Procedure: Cervical four-five Anterior cervical decompression/discectomy/fusion;  Surgeon: Judith Part, MD;  Location: [facility];  Service: Neurosurgery;  Laterality: N/A;  .  APPENDECTOMY    . CESAREAN SECTION     twice  . COLONOSCOPY    . ESOPHAGOGASTRODUODENOSCOPY (EGD) WITH PROPOFOL N/A 06/30/2013   Procedure: ESOPHAGOGASTRODUODENOSCOPY (EGD) WITH PROPOFOL;  Surgeon: Arta Silence, MD;  Location: [facility];  Service: Endoscopy;  Laterality: N/A;  . NECK SURGERY    . WEDGE RESECTION     left ovary with cyst removed ,hx. multiple cysts removed prior    Social History   reports that she has been smoking cigarettes. She has a 4.32 pack-year smoking history. She has never used smokeless tobacco. She reports that she does not drink alcohol and does not use drugs.  Allergies  Allergen Reactions  . Lodine [Etodolac] Swelling and Other (See Comments)    Facial swelling   . Ambien [Zolpidem Tartrate] Other (See Comments)    Headaches, memory loss     Family History  Problem Relation Age of Onset  . Heart attack Mother 2       with CABG  . Heart disease Mother   . Hypertension Mother   . Migraines Mother   . Heart attack Father   . Heart disease Father   . Diabetes Mellitus II Father   . Hypertension Father   . Lupus Sister   . Depression Sister   . Migraines Sister   . Renal Disease Brother   . Diabetes Sister   . Hypertension Sister   . Migraines Sister   . Migraines Sister   . Healthy Sister   . Healthy Sister   . Healthy Sister   . Healthy Brother   . Dementia Maternal Grandfather   . Colon cancer Maternal Grandmother   . Heart disease Maternal Grandmother   . Hypertension Maternal Grandmother   . Healthy Son   . Migraines Son      Prior to Admission medications   Medication Sig Start Date End Date Taking? Authorizing Provider  aspirin 81 MG chewable tablet Chew 81 mg by mouth daily.    [provider]  clonazePAM (KLONOPIN) 0.5 MG tablet TAKE 1 TABLET(0.5 MG) BY MOUTH TWICE DAILY AS NEEDED FOR ANXIETY 03/14/21   Adelene Idler, Helene Kelp T, PA-C  cloNIDine (CATAPRES) 0.1 MG tablet Take 0.1 mg by mouth 2 (two) times daily.    [provider]  cyclobenzaprine (FLEXERIL) 5 MG tablet Take 1 tablet (5 mg total) by mouth every 8 (eight) hours as needed for muscle spasms. Patient not taking: No sig reported 04/03/20   Sater, Nanine Means, MD  erythromycin ophthalmic ointment erythromycin 5 mg/gram (0.5 %) eye ointment  APPLY 1 THIN LAYER TO THE EYELIDS TWICE DAILY    [provider]  gabapentin (NEURONTIN) 800 MG tablet Take 800  mg by mouth 3 (three) times daily. 05/12/17   [provider]  sertraline (ZOLOFT) 100 MG tablet Take 2 tablets (200 mg total) by mouth daily. 02/02/21   Donnal Moat T, PA-C  Tapentadol HCl (NUCYNTA ER) 150 MG TB12 Take 1 tablet by mouth daily. Patient not taking: No sig reported    [provider]  Tapentadol HCl (NUCYNTA) 100 MG TABS Take 100 mg by mouth in the morning and at bedtime. Takes twice a day for breakthrough pain. Also takes Nucynta 150mg  ER daily    [provider]    Physical Exam: Vitals:   03/15/21 1115 03/15/21 1255 03/15/21 1509 03/15/21 1545  BP: 124/67 139/65 (!) 141/61 (!) 155/85  Pulse: 61 (!) 50 (!) 48 (!) 55  Resp: 14 18  12  Temp: 98.2 F (36.8 C)     TempSrc: Oral     SpO2: 97% 99% 100% 100%    Constitutional: NAD, calm, comfortable Vitals:   03/15/21 1115 03/15/21 1255 03/15/21 1509 03/15/21 1545  BP: 124/67 139/65 (!) 141/61 (!) 155/85  Pulse: 61 (!) 50 (!) 48 (!) 55  Resp: 14 18  12   Temp: 98.2 F (36.8 C)     TempSrc: Oral     SpO2: 97% 99% 100% 100%   Eyes: PERRL, lids and conjunctivae normal ENMT: Mucous membranes are moist. Posterior pharynx clear of any exudate or lesions.Normal dentition.  Neck: normal, supple, no masses, no thyromegaly Respiratory: clear to auscultation bilaterally, no wheezing, no crackles. Normal respiratory effort. No accessory muscle use.  Cardiovascular: Regular rate and rhythm, no murmurs / rubs / gallops. No extremity edema. 2+ pedal pulses. No carotid bruits.  Abdomen: no tenderness, no masses palpated. No hepatosplenomegaly. Bowel sounds positive.  Musculoskeletal: no clubbing / cyanosis. No joint deformity upper and lower extremities. Good ROM, no contractures. Normal muscle tone.  Skin: no rashes, lesions, ulcers. No induration Neurologic: She is awake alert and oriented x3 3-12 are grossly intact sensation is intact throughout muscle strength is 5 5 in all 4 extremities except on the  right lower extremity which causes pain with passive and active movement probably due to her RDS deep tendon reflexes are symmetrical and 2+ bilaterally.   Psychiatric: Normal judgment and insight. Alert and oriented x 3. Normal mood.    Labs on Admission: I have personally reviewed following labs and imaging studies  CBC: Recent Labs  Lab 03/15/21 1118 03/15/21 1132  WBC 4.2  --   NEUTROABS 2.2  --   HGB 12.4 11.2*  HCT 36.1 33.0*  MCV 87.4  --   PLT 205  --     Basic Metabolic Panel: Recent Labs  Lab 03/15/21 1118 03/15/21 1132  NA 141 139  K 3.9 4.7  CL 108 108  CO2 24  --   GLUCOSE 99 103*  BUN 9 11  CREATININE 0.83 0.70  CALCIUM 9.1  --     GFR: CrCl cannot be calculated (Unknown ideal weight.).  Liver Function Tests: Recent Labs  Lab 03/15/21 1118  AST 26  ALT 20  ALKPHOS 79  BILITOT 0.4  PROT 6.9  ALBUMIN 4.0    Urine analysis:    Component Value Date/Time   COLORURINE YELLOW 03/14/2019 1746   APPEARANCEUR HAZY (A) 03/14/2019 1746   LABSPEC 1.017 03/14/2019 1746   PHURINE 5.0 03/14/2019 1746   GLUCOSEU NEGATIVE 03/14/2019 1746   HGBUR NEGATIVE 03/14/2019 1746   [place] 03/14/2019 1746   [place] 03/14/2019 1746   PROTEINUR NEGATIVE 03/14/2019 1746   UROBILINOGEN 1.0 09/02/2012 1917   NITRITE NEGATIVE 03/14/2019 1746   LEUKOCYTESUR LARGE (A) 03/14/2019 1746    Radiological Exams on Admission: CT HEAD WO CONTRAST  Result Date: 03/15/2021 CLINICAL DATA:  Slurred speech. Bilateral temporal headache. Heaviness in both lower extremities. Symptoms beginning last night. EXAM: CT HEAD WITHOUT CONTRAST TECHNIQUE: Contiguous axial images were obtained from the base of the skull through the vertex without intravenous contrast. COMPARISON:  10/17/2020 FINDINGS: Brain: No evidence of acute infarction, hemorrhage, hydrocephalus, extra-axial collection or mass lesion/mass effect. Patchy white matter hypoattenuation is noted consistent  with moderate chronic microvascular ischemic change, stable from prior head CT. Vascular: No hyperdense vessel or unexpected calcification. Skull: Normal. Negative for fracture or focal lesion. Sinuses/Orbits: Globes and orbits are unremarkable.  Mild mucosal thickening most evident along the ethmoids. Other: None. IMPRESSION: 1. No acute intracranial abnormalities. 2. Moderate chronic microvascular ischemic change. Electronically Signed   By: Lajean Manes M.D.   On: 03/15/2021 13:04    EKG: Independently reviewed.  Sinus rhythm normal axis intervals are intact no T wave abnormalities.  Assessment/Plan Slurred speech: I have tried to ask her if everything at home is okay home, she relates no psychologic stressors or no physical or mental abuse abuse, her history does not make any sense and her neurological exam I cannot pinpoint it to an exact location. She takes an aspirin at home we will continue aspirin 81 mg. We will go ahead and admit her to the hospital get an MRI of the brain rule out a CVA, her CT of the head showed no acute intracranial abnormalities, get a carotid Doppler monitor on telemetry, get an EEG to rule out seizures as her husband relates some biting of her cheek, but she did not lose control of her sphincter lost consciousness she was awake and alert during these episodes. We will go ahead and consult neurology as she will probably need follow-up as an outpatient. Twelve-lead EKG showed normal sinus rhythm I cannot appreciate any murmurs on physical exam.  We will admit her on telemetry. -Check HIV.  Chronic pain disorder I will be really cautious of giving her additional narcotics will resume her home dose.  RSD lower limb And resume her gabapentin and Tapentadol.  DVT prophylaxis: lovenxo Code Status:   Full Family Communication:  [place] Disposition Plan: She will be admitted under observation status. Consults called:  Neurology Admission status:   Observation  Severity of Illness: The appropriate patient status for this patient is OBSERVATION. Observation status is judged to be reasonable and necessary in order to provide the required intensity of service to ensure the patient's safety. The patient's presenting symptoms, physical exam findings, and initial radiographic and laboratory data in the context of their medical condition is felt to place them at decreased risk for further clinical deterioration. Furthermore, it is anticipated that the patient will be medically stable for discharge from the hospital within 2 midnights of admission. The following factors support the patient status of observation.     Charlynne Cousins MD [facility]  How to contact the [facility] Attending or Consulting provider [place] or covering provider during after hours [place], for this patient?   1. Check the care team in [facility] and look for a) attending/consulting [facility] provider listed and b) the [facility] team listed 2. Log into [email or web address] and use [place]'s universal password to access. If you do not have the password, please contact the hospital operator. 3. Locate the [facility] provider you are looking for under [facility] and page to a number that you can be directly reached. 4. If you still have difficulty reaching the provider, please page the [facility] (Director on Call) for the Hospitalists listed on amion for assistance.  03/15/2021, 4:41 PM

## 2021-03-15 NOTE — ED Triage Notes (Signed)
Pt reports last night "sometime after 9pm" she started having slurred speech, noticed by her husband. Reports difficulty swallowing d/t dry mouth, lightheadedness this morning. Also endorses tingling in R hand, which is normal for her. Pain in BLE.

## 2021-03-15 NOTE — Telephone Encounter (Signed)
I am unable to see her detailed medication list, note pops up stating the chart is being used elsewhere.  I will review again tomorrow.

## 2021-03-15 NOTE — ED Provider Notes (Signed)
[facility] EMERGENCY DEPARTMENT Provider Note   CSN: [id number] Arrival date & time: 03/15/21  1055     History Chief Complaint  Patient presents with  . Aphasia    Becky Wolfe is a 60 y.o. female.  HPI Patient presents with difficulty speaking.  Began last night.  Lasted for more than an hour.  Reportedly was having difficulty getting the words out.  More than just slurred speech.  However was biting on the left side of her cheek also.  Husband states that the left side of her lower face looked abnormal also.  Improved now.  Able to speak normally now.  Has a questionable history of MS.  Not currently on MS treatment.  No headache.  No confusion.  States also has tingling in her right hand which is chronic for her.  History of reflex sympathetic dystrophy.  Is on chronic medicines for this.  Around 2 months ago had increase of her Neurontin.    Past Medical History:  Diagnosis Date  . Anxiety 06-16-13   hx. panic attacks, none recent  . Blood transfusion without reported diagnosis    20 yrs ago after childbirth  . Bronchitis    hx of  . Chronic neck pain   . Constipation   . Depression   . DVT (deep venous thrombosis) ([place]) 2015   rt. leg   . Headache(784.0)    migraines-has decreased  . Heart murmur    Dr. Etter Sjogren  . Hepatitis C    dx. Hep. C(s/p transfusion age 43) low Hgb  .-multiple transfusions. Treated successfully  . Multiple sclerosis ([place])    dx. -61yrs ago, then med stopped-due to liver enzymes changes,occ. periods of weakness in arms"d.  was seen at [facility] and tolded that she does not have MSrops things"  . Pneumonia    hx of , ? mild emphysema on CT scan 5'14, multiple pulmonary nodules  . RSD (reflex sympathetic dystrophy) 06-16-13   legs  . RSD (reflex sympathetic dystrophy) 2009  . TIA (transient ischemic attack)    hx of (weakness left side)-none in 1 yr    Patient Active Problem List   Diagnosis Date Noted  . Protruded cervical  disc 06/22/2020  . Complex regional pain syndrome type 1 of lower extremity 06/22/2020  . Numbness 12/30/2018  . Cervical myelopathy ([place]) 08/05/2018  . Neck pain 02/19/2018  . History of fusion of cervical spine 02/19/2018  . Urinary hesitancy 02/19/2018  . Chronic non-specific white matter lesions on MRI 02/19/2018  . Accidental overdose 03/07/2016  . Atherosclerosis of native coronary artery of native heart without angina pectoris 02/10/2015  . History of DVT (deep vein thrombosis) 02/10/2015  . Tobacco abuse 02/10/2015  . Right leg pain 04/23/2013  . DVT (deep vein thrombosis) in pregnancy 04/23/2013  . Chronic pain disorder 04/23/2013  . RSD lower limb 04/23/2013  . Chronic anticoagulation 04/23/2013    Past Surgical History:  Procedure Laterality Date  . ABDOMINAL HYSTERECTOMY    . ANAL RECTAL MANOMETRY N/A 04/11/2015   Procedure: ANO RECTAL MANOMETRY;  Surgeon: Arta Silence, MD;  Location: [facility];  Service: Endoscopy;  Laterality: N/A;  . ANAL RECTAL MANOMETRY N/A 05/21/2017   Procedure: ANO RECTAL MANOMETRY;  Surgeon: Arta Silence, MD;  Location: [facility];  Service: Endoscopy;  Laterality: N/A;  . ANKLE SURGERY Right    no retained hardware  . ANTERIOR CERVICAL DECOMP/DISCECTOMY FUSION  09/21/2012   Procedure: ANTERIOR CERVICAL DECOMPRESSION/DISCECTOMY FUSION 2 LEVELS;  Surgeon: Otilio Connors, MD;  Location: [facility] NEURO ORS;  Service: Neurosurgery;  Laterality: N/A;  Cervical five-six, cervical seven-thoracic one Anterior cervical decompression/diskectomy/fusion/Lifenet bone/trestle plate/Prior Cervical six--seven Anterior cervical fusion/Removing trestle plate  . ANTERIOR CERVICAL DECOMP/DISCECTOMY FUSION N/A 08/05/2018   Procedure: Cervical four-five Anterior cervical decompression/discectomy/fusion;  Surgeon: Judith Part, MD;  Location: [facility];  Service: Neurosurgery;  Laterality: N/A;  . APPENDECTOMY    . CESAREAN SECTION     twice  . COLONOSCOPY    .  ESOPHAGOGASTRODUODENOSCOPY (EGD) WITH PROPOFOL N/A 06/30/2013   Procedure: ESOPHAGOGASTRODUODENOSCOPY (EGD) WITH PROPOFOL;  Surgeon: Arta Silence, MD;  Location: [facility];  Service: Endoscopy;  Laterality: N/A;  . NECK SURGERY    . WEDGE RESECTION     left ovary with cyst removed ,hx. multiple cysts removed prior     OB History   No obstetric history on file.     Family History  Problem Relation Age of Onset  . Heart attack Mother 82       with CABG  . Heart disease Mother   . Hypertension Mother   . Migraines Mother   . Heart attack Father   . Heart disease Father   . Diabetes Mellitus II Father   . Hypertension Father   . Lupus Sister   . Depression Sister   . Migraines Sister   . Renal Disease Brother   . Diabetes Sister   . Hypertension Sister   . Migraines Sister   . Migraines Sister   . Healthy Sister   . Healthy Sister   . Healthy Sister   . Healthy Brother   . Dementia Maternal Grandfather   . Colon cancer Maternal Grandmother   . Heart disease Maternal Grandmother   . Hypertension Maternal Grandmother   . Healthy Son   . Migraines Son     Social History   Tobacco Use  . Smoking status: Current Some Day Smoker    Packs/day: 0.12    Years: 36.00    Pack years: 4.32  . Smokeless tobacco: Never Used  . Tobacco comment: 0-2 per day  Vaping Use  . Vaping Use: Never used  Substance Use Topics  . Alcohol use: No  . Drug use: No    Home Medications Prior to Admission medications   Medication Sig Start Date End Date Taking? Authorizing Provider  aspirin 81 MG chewable tablet Chew 81 mg by mouth daily.    [provider]  clonazePAM (KLONOPIN) 0.5 MG tablet TAKE 1 TABLET(0.5 MG) BY MOUTH TWICE DAILY AS NEEDED FOR ANXIETY 03/14/21   Adelene Idler, Helene Kelp T, PA-C  cloNIDine (CATAPRES) 0.1 MG tablet Take 0.1 mg by mouth 2 (two) times daily.    [provider]  cyclobenzaprine (FLEXERIL) 5 MG tablet Take 1 tablet (5 mg total) by mouth every 8  (eight) hours as needed for muscle spasms. Patient not taking: No sig reported 04/03/20   Sater, Nanine Means, MD  erythromycin ophthalmic ointment erythromycin 5 mg/gram (0.5 %) eye ointment  APPLY 1 THIN LAYER TO THE EYELIDS TWICE DAILY    [provider]  gabapentin (NEURONTIN) 800 MG tablet Take 800 mg by mouth 3 (three) times daily. 05/12/17   [provider]  sertraline (ZOLOFT) 100 MG tablet Take 2 tablets (200 mg total) by mouth daily. 02/02/21   Donnal Moat T, PA-C  Tapentadol HCl (NUCYNTA ER) 150 MG TB12 Take 1 tablet by mouth daily. Patient not taking: No sig reported    [provider]  Tapentadol HCl (NUCYNTA) 100 MG TABS Take 100 mg by mouth in the morning and at bedtime. Takes twice a day for breakthrough pain. Also takes Nucynta 150mg  ER daily    [provider]    Allergies    Lodine [etodolac] and Ambien [zolpidem tartrate]  Review of Systems   Review of Systems  Constitutional: Negative for appetite change and fever.  Respiratory: Negative for shortness of breath.   Gastrointestinal: Negative for abdominal pain.  Musculoskeletal: Negative for back pain.  Skin: Negative for rash.  Neurological: Positive for speech difficulty.  Psychiatric/Behavioral: Negative for confusion.    Physical Exam Updated Vital Signs BP (!) 155/85   Pulse (!) 55   Temp 98.2 F (36.8 C) (Oral)   Resp 12   LMP 10/16/2011   SpO2 100%   Physical Exam Vitals and nursing note reviewed.  HENT:     Head: Atraumatic.     Right Ear: External ear normal.     Left Ear: External ear normal.     Mouth/Throat:     Mouth: Mucous membranes are moist.     Comments: Some bite marks to cheek on left. Eyes:     Extraocular Movements: Extraocular movements intact.     Pupils: Pupils are equal, round, and reactive to light.  Cardiovascular:     Rate and Rhythm: Regular rhythm.  Pulmonary:     Effort: Pulmonary effort is normal.  Abdominal:     Tenderness: There  is no abdominal tenderness.  Musculoskeletal:     Cervical back: Neck supple.  Skin:    General: Skin is warm.     Capillary Refill: Capillary refill takes less than 2 seconds.  Neurological:     Mental Status: She is alert.     Comments: Potentially has mild left lower facial droop.  Is able to raise left eyebrow.  Normal speech.  Eye movements intact.  Sensation intact grossly bilateral upper and lower extremities.     ED Results / Procedures / Treatments   Labs (all labs ordered are listed, but only abnormal results are displayed) Labs Reviewed  I-STAT CHEM 8, ED - Abnormal; Notable for the following components:      Result Value   Glucose, Bld 103 (*)    Calcium, Ion 1.02 (*)    Hemoglobin 11.2 (*)    HCT 33.0 (*)    All other components within normal limits  PROTIME-INR  APTT  CBC  DIFFERENTIAL  COMPREHENSIVE METABOLIC PANEL  CBG MONITORING, ED  I-STAT BETA HCG BLOOD, ED ([facility], [facility], [facility] ONLY)    EKG EKG Interpretation  Date/Time:  Thursday Mar 15 2021 11:09:49 EDT Ventricular Rate:  63 PR Interval:  110 QRS Duration: 78 QT Interval:  400 QTC Calculation: 409 R Axis:   9 Text Interpretation: Sinus rhythm with short PR Cannot rule out Anterior infarct , age undetermined Abnormal ECG Confirmed by Davonna Belling [phone]) on 03/15/2021 3:24:56 PM   Radiology CT HEAD WO CONTRAST  Result Date: 03/15/2021 CLINICAL DATA:  Slurred speech. Bilateral temporal headache. Heaviness in both lower extremities. Symptoms beginning last night. EXAM: CT HEAD WITHOUT CONTRAST TECHNIQUE: Contiguous axial images were obtained from the base of the skull through the vertex without intravenous contrast. COMPARISON:  10/17/2020 FINDINGS: Brain: No evidence of acute infarction, hemorrhage, hydrocephalus, extra-axial collection or mass lesion/mass effect. Patchy white matter hypoattenuation is noted consistent with moderate chronic microvascular ischemic change, stable from prior head CT. Vascular:  No hyperdense vessel or  unexpected calcification. Skull: Normal. Negative for fracture or focal lesion. Sinuses/Orbits: Globes and orbits are unremarkable. Mild mucosal thickening most evident along the ethmoids. Other: None. IMPRESSION: 1. No acute intracranial abnormalities. 2. Moderate chronic microvascular ischemic change. Electronically Signed   By: Lajean Manes M.D.   On: 03/15/2021 13:04    Procedures Procedures   Medications Ordered in ED Medications  sodium chloride flush (NS) 0.9 % injection 3 mL (has no administration in time range)    ED Course  I have reviewed the triage vital signs and the nursing notes.  Pertinent labs & imaging results that were available during my care of the patient were reviewed by me and considered in my medical decision making (see chart for details).    MDM Rules/Calculators/A&P                          Patient presented with difficulty speaking.  Also reportedly had facial droop and was biting her cheek.  Improved now.  No headache.  Question of MS history although reviewing records appears that they do not think it is MS at this time.  ABCD 2 score of 4.  With this higher risk of feels of patient would benefit from mission to the hospital for TIA work-up since symptoms are improved.  Will discuss with hospitalist. Final Clinical Impression(s) / ED Diagnoses Final diagnoses:  Aphasia    Rx / DC Orders ED Discharge Orders    None       Davonna Belling, MD 03/15/21 1557

## 2021-03-15 NOTE — Telephone Encounter (Signed)
Last filled 01/31/21.Appt on 04/17/21 pended

## 2021-03-15 NOTE — ED Notes (Signed)
Attempted report x1. 

## 2021-03-15 NOTE — Consult Note (Signed)
Referring Physician: Dr. David StallFeliz Wolfe    Chief Complaint: Transient difficulty with speech and sensory numbness with weakness to right face, arm and leg.   HPI: Becky Wolfe is an 60 y.o. female who presented to the ED this morning for evaluation of slurred speech. She states the slurring began sometime after 9 PM on Wednesday night. She described this as having trouble getting the words out in conjunction with dysarthria. She had some facial weakness on the left which resulted in her biting the left side of her inner cheek. Her husband noted an abnormal appearance to the left side of her lower face. She denies having had any confusion. She also has had difficulty swallowing due to dry mouth, lightheadedness, tingling in right hand (chronic) and pain in BLE, right worse than left.   She has chronic RLE pain and touch sensitivity due to RSD. She takes Neurontin for this, which was increased about 2 months ago. She previously was diagnosed with MS but obtained second opinions [facility] and with Dr. Epimenio Wolfe, who patient states told her that they did not feel she has MS. She is on daily ASA at home.   Her PMHx includes anxiety with panic attacks, chronic neck pain s/p ACDF, RSD affecting the RLE, depression, DVT, migraine headaches, hepatitis C and TIA.   Past Medical History:  Diagnosis Date  . Anxiety 06-16-13   hx. panic attacks, none recent  . Blood transfusion without reported diagnosis    20 yrs ago after childbirth  . Bronchitis    hx of  . Chronic neck pain   . Constipation   . Depression   . DVT (deep venous thrombosis) (HCC) 2015   rt. leg   . Headache(784.0)    migraines-has decreased  . Heart murmur    Dr. Dione Wolfe  . Hepatitis C    dx. Hep. C(s/p transfusion age 60) low Hgb  .-multiple transfusions. Treated successfully  . Multiple sclerosis (HCC)    dx. -2247yrs ago, then med stopped-due to liver enzymes changes,occ. periods of weakness in arms"d.  [facility] and  tolded that she does not have MSrops things"  . Pneumonia    hx of , ? mild emphysema on CT scan 5'14, multiple pulmonary nodules  . RSD (reflex sympathetic dystrophy) 06-16-13   legs  . RSD (reflex sympathetic dystrophy) 2009  . TIA (transient ischemic attack)    hx of (weakness left side)-none in 1 yr    Past Surgical History:  Procedure Laterality Date  . ABDOMINAL HYSTERECTOMY    . ANAL RECTAL MANOMETRY N/Wolfe 04/11/2015   Procedure: ANO RECTAL MANOMETRY;  Surgeon: Becky ModenaWilliam Outlaw, Wolfe;  Location: [facility] END[facility];  Service: Endoscopy;  Laterality: N/Wolfe;  . ANAL RECTAL MANOMETRY N/Wolfe 05/21/2017   Procedure: ANO RECTAL MANOMETRY;  Surgeon: Becky Wolfe;  Location: [facility] ENDOSCOPY;  Service: Endoscopy;  Laterality: N/Wolfe;  . ANKLE SURGERY Right    no retained hardware  . ANTERIOR CERVICAL DECOMP/DISCECTOMY FUSION  09/21/2012   Procedure: ANTERIOR CERVICAL DECOMPRESSION/DISCECTOMY FUSION 2 LEVELS;  Surgeon: Becky Wolfe;  Location: [facility] NEURO OR[facility];  Service: Neurosurgery;  Laterality: N/Wolfe;  Cervical five-six, cervical seven-thoracic one Anterior cervical decompression/diskectomy/fusion/Lifenet bone/trestle plate/Prior Cervical six--seven Anterior cervical fusion/Removing trestle plate  . ANTERIOR CERVICAL DECOMP/DISCECTOMY FUSION N/Wolfe 08/05/2018   Procedure: Cervical four-five Anterior cervical decompression/discectomy/fusion;  Surgeon: Becky Wolfe, Becky Wolfe;  Location: [facility] OR;  Service: Neurosurgery;  Laterality: N/Wolfe;  . APPENDECTOMY    . CESAREAN SECTION  twice  . COLONOSCOPY    . ESOPHAGOGASTRODUODENOSCOPY (EGD) WITH PROPOFOL N/Wolfe 06/30/2013   Procedure: ESOPHAGOGASTRODUODENOSCOPY (EGD) WITH PROPOFOL;  Surgeon: Becky Silence, Wolfe;  Location: [facility] END[facility];  Service: Endoscopy;  Laterality: N/Wolfe;  . NECK SURGERY    . WEDGE RESECTION     left ovary with cyst removed ,hx. multiple cysts removed prior    Family History  Problem Relation Age of Onset  . Heart attack Mother 54       with CABG   . Heart disease Mother   . Hypertension Mother   . Migraines Mother   . Heart attack Father   . Heart disease Father   . Diabetes Mellitus II Father   . Hypertension Father   . Lupus Sister   . Depression Sister   . Migraines Sister   . Renal Disease Brother   . Diabetes Sister   . Hypertension Sister   . Migraines Sister   . Migraines Sister   . Healthy Sister   . Healthy Sister   . Healthy Sister   . Healthy Brother   . Dementia Maternal Grandfather   . Colon cancer Maternal Grandmother   . Heart disease Maternal Grandmother   . Hypertension Maternal Grandmother   . Healthy Son   . Migraines Son    Social History:  reports that she has been smoking cigarettes. She has Wolfe 4.32 pack-year smoking history. She has never used smokeless tobacco. She reports that she does not drink alcohol and does not use drugs.  Allergies:  Allergies  Allergen Reactions  . Etodolac Swelling and Other (See Comments)    Facial swelling   . Ambien [Zolpidem Tartrate] Other (See Comments)    Headaches, memory loss   . Namenda [Memantine] Other (See Comments)    Headaches     Home Medications:  No current facility-administered medications on file prior to encounter.   Current Outpatient Medications on File Prior to Encounter  Medication Sig Dispense Refill  . aspirin 81 MG chewable tablet Chew 81 mg by mouth daily.    . clonazePAM (KLONOPIN) 0.5 MG tablet TAKE 1 TABLET(0.5 MG) BY MOUTH TWICE DAILY AS NEEDED FOR ANXIETY (Patient taking differently: Take 0.5 mg by mouth 2 (two) times daily as needed for anxiety.) 60 tablet 1  . cloNIDine (CATAPRES) 0.1 MG tablet Take 0.1 mg by mouth 2 (two) times daily.    . cyclobenzaprine (FLEXERIL) 5 MG tablet Take 1 tablet (5 mg total) by mouth every 8 (eight) hours as needed for muscle spasms. (Patient taking differently: Take 5 mg by mouth as needed for muscle spasms.) 90 tablet 1  . gabapentin (NEURONTIN) 800 MG tablet Take 800 mg by mouth 4 (four)  times daily.  1  . naloxone (NARCAN) nasal spray 4 mg/0.1 mL Place 0.4 mg into the nose once.    Marland Kitchen omeprazole (PRILOSEC) 40 MG capsule Take 40 mg by mouth daily.    . sertraline (ZOLOFT) 100 MG tablet Take 2 tablets (200 mg total) by mouth daily. 180 tablet 0  . Tapentadol HCl (NUCYNTA) 100 MG TABS Take 100 mg by mouth every 6 (six) hours.       ROS: Severe headache and dry mouth. Anxious due to missing afternoon dose of anxiolytic medications. Has chronic RSD pain involving her RLE. Other ROS as per HPI.   Physical Examination: Blood pressure (!) 155/85, pulse (!) 55, temperature 98.2 F (36.8 C), temperature source Oral, resp. rate 12, last menstrual period 10/16/2011, SpO2 100 %.  HEENT: [place]/AT Lungs: Respirations unlabored Ext: No edema  Neurologic Examination: Mental Status: Awake and alert. Oriented x 5. Speech fluent with intact naming and comprehension.  Cranial Nerves: II:  Temporal visual fields intact with no extinction to DSS. PERRL.  III,IV, VI: No ptosis. EOMI. No nystagmus.  V,VII: Smile symmetric, facial temp sensation equal bilaterally VIII: hearing intact to voice IX,X: No pharyngeal dysarthria XI: Symmetric XII: Midline tongue extension  Motor: RUE and LUE: 5/5 without asymmetry. No pronator drift. Orbiting fingers test is normal.  RLE: Compromised by RSD pain. Max strength elicitable to hip flexion and knee extension is 4/5. ADF 5/5. Cannot plantar flex against resistance due to severe pain.  LLE: 5/5 proximally and distally Sensory: Temp and light touch intact to FT and temp x 4, except for RLE which exhibits allodynia, hyperalgesia and dysesthesia.  Deep Tendon Reflexes:  Deferred RLE DTRs due to chronic pain.  Right brachioradialis and biceps: 1+ Left brachioradialis and biceps: 2+ Left patellar: 3+ Toes downgoing bilaterally (she did consent to testing of Babinski on the right) Cerebellar: No ataxia with FNF bilaterally  Gait: Deferred  Results for  orders placed or performed during the hospital encounter of 03/15/21 (from the past 48 hour(s))  Protime-INR     Status: None   Collection Time: 03/15/21 11:18 AM  Result Value Ref Range   Prothrombin Time 14.6 11.4 - 15.2 seconds   INR 1.1 0.8 - 1.2    Comment: (NOTE) INR goal varies based on device and disease states. Performed at [facility], [place] [address], [place], [place] [address]   APTT     Status: None   Collection Time: 03/15/21 11:18 AM  Result Value Ref Range   aPTT 29 24 - 36 seconds    Comment: Performed at [facility] [address], [place], [place] [address]  CBC     Status: None   Collection Time: 03/15/21 11:18 AM  Result Value Ref Range   WBC 4.2 4.0 - 10.5 K/uL   RBC 4.13 3.87 - 5.11 MIL/uL   Hemoglobin 12.4 12.0 - 15.0 g/dL   HCT 36.1 36.0 - 46.0 %   MCV 87.4 80.0 - 100.0 fL   MCH 30.0 26.0 - 34.0 pg   MCHC 34.3 30.0 - 36.0 g/dL   RDW 12.3 11.5 - 15.5 %   Platelets 205 150 - 400 K/uL   nRBC 0.0 0.0 - 0.2 %    Comment: Performed at [facility], [place] [address] [place], [place] [address]  Differential     Status: None   Collection Time: 03/15/21 11:18 AM  Result Value Ref Range   Neutrophils Relative % 52 %   Neutro Abs 2.2 1.7 - 7.7 K/uL   Lymphocytes Relative 38 %   Lymphs Abs 1.6 0.7 - 4.0 K/uL   Monocytes Relative 7 %   Monocytes Absolute 0.3 0.1 - 1.0 K/uL   Eosinophils Relative 2 %   Eosinophils Absolute 0.1 0.0 - 0.5 K/uL   Basophils Relative 1 %   Basophils Absolute 0.0 0.0 - 0.1 K/uL   Immature Granulocytes 0 %   Abs Immature Granulocytes 0.01 0.00 - 0.07 K/uL    Comment: Performed at [facility], [place] [address], [place], [place] [address]  Comprehensive metabolic panel     Status: None   Collection Time: 03/15/21 11:18 AM  Result Value Ref Range   Sodium 141 135 - 145 mmol/L   Potassium 3.9  3.5 - 5.1 mmol/L   Chloride 108 98 - 111 mmol/L   CO2 24 22 - 32 mmol/L   Glucose, Bld 99 70 - 99 mg/dL    Comment:  Glucose reference range applies only to samples taken after fasting for at least 8 hours.   BUN 9 6 - 20 mg/dL   Creatinine, Ser 0.83 0.44 - 1.00 mg/dL   Calcium 9.1 8.9 - 10.3 mg/dL   Total Protein 6.9 6.5 - 8.1 g/dL   Albumin 4.0 3.5 - 5.0 g/dL   AST 26 15 - 41 U/L   ALT 20 0 - 44 U/L   Alkaline Phosphatase 79 38 - 126 U/L   Total Bilirubin 0.4 0.3 - 1.2 mg/dL   GFR, Estimated >60 >60 mL/min    Comment: (NOTE) Calculated using the CKD-EPI Creatinine Equation (2021)    Anion gap 9 5 - 15    Comment: Performed at [facility] [address], [place], [place] [address]  I-Stat beta hCG blood, ED     Status: None   Collection Time: 03/15/21 11:29 AM  Result Value Ref Range   I-stat hCG, quantitative <5.0 <5 mIU/mL   Comment 3            Comment:   GEST. AGE      CONC.  (mIU/mL)   <=1 WEEK        5 - 50     2 WEEKS       50 - 500     3 WEEKS       100 - 10,000     4 WEEKS     1,000 - 30,000        FEMALE AND NON-PREGNANT FEMALE:     LESS THAN 5 mIU/mL   I-stat chem 8, ED     Status: Abnormal   Collection Time: 03/15/21 11:32 AM  Result Value Ref Range   Sodium 139 135 - 145 mmol/L   Potassium 4.7 3.5 - 5.1 mmol/L   Chloride 108 98 - 111 mmol/L   BUN 11 6 - 20 mg/dL   Creatinine, Ser 0.70 0.44 - 1.00 mg/dL   Glucose, Bld 103 (H) 70 - 99 mg/dL    Comment: Glucose reference range applies only to samples taken after fasting for at least 8 hours.   Calcium, Ion 1.02 (L) 1.15 - 1.40 mmol/L   TCO2 27 22 - 32 mmol/L   Hemoglobin 11.2 (L) 12.0 - 15.0 g/dL   HCT 33.0 (L) 36.0 - 46.0 %  CBG monitoring, ED     Status: None   Collection Time: 03/15/21  3:41 PM  Result Value Ref Range   Glucose-Capillary 78 70 - 99 mg/dL    Comment: Glucose reference range applies only to samples taken after fasting for at least 8 hours.   CT HEAD WO CONTRAST  Result Date: 03/15/2021 CLINICAL DATA:  Slurred speech. Bilateral temporal headache. Heaviness in both lower extremities. Symptoms  beginning last night. EXAM: CT HEAD WITHOUT CONTRAST TECHNIQUE: Contiguous axial images were obtained from the base of the skull through the vertex without intravenous contrast. COMPARISON:  10/17/2020 FINDINGS: Brain: No evidence of acute infarction, hemorrhage, hydrocephalus, extra-axial collection or mass lesion/mass effect. Patchy white matter hypoattenuation is noted consistent with moderate chronic microvascular ischemic change, stable from prior head CT. Vascular: No hyperdense vessel or unexpected calcification. Skull: Normal. Negative for fracture or focal lesion. Sinuses/Orbits: Globes and orbits are unremarkable. Mild mucosal thickening most evident along  the ethmoids. Other: None. IMPRESSION: 1. No acute intracranial abnormalities. 2. Moderate chronic microvascular ischemic change. Electronically Signed   By: Lajean Manes M.D.   On: 03/15/2021 13:04    Assessment: 60 y.o. female presenting with transient difficulty with speech in addition to sensory numbness and weakness to right face, arm and leg.  1. Exam reveals sensory abnormalities involving her RLE which are chronic in the context of her RSD. No focal weakness, facial droop, dysarthria or dysphasia noted.  2. CT head: No acute intracranial abnormalities. Moderate chronic microvascular ischemic change. 3. Stroke Risk Factors - DVT and prior TIA.  4. History of reflex sympathetic dystrophy affecting the RLE.   Recommendations: 1. HgbA1c, fasting lipid panel 2. MRI, MRA of the brain without contrast 3. PT consult, OT consult, Speech consult 4. Echocardiogram 5. Carotid dopplers 6. Prophylactic therapy- Continue ASA. Stroke team to determine if Plavix and/or atorvastatin are indicated 7. Risk factor modification 8. Telemetry monitoring 9. Frequent neuro checks 10. Permissive HTN x 24 hours.  11. Continue her anti-anxiety medications.    @Electronically  signed: Dr. Kerney Elbe  03/15/2021, 5:29 PM

## 2021-03-16 ENCOUNTER — Observation Stay (HOSPITAL_COMMUNITY): Payer: Medicare Other

## 2021-03-16 DIAGNOSIS — G894 Chronic pain syndrome: Secondary | ICD-10-CM

## 2021-03-16 DIAGNOSIS — R29818 Other symptoms and signs involving the nervous system: Secondary | ICD-10-CM | POA: Diagnosis not present

## 2021-03-16 DIAGNOSIS — R4701 Aphasia: Secondary | ICD-10-CM

## 2021-03-16 DIAGNOSIS — I6782 Cerebral ischemia: Secondary | ICD-10-CM | POA: Diagnosis not present

## 2021-03-16 DIAGNOSIS — G90521 Complex regional pain syndrome I of right lower limb: Secondary | ICD-10-CM | POA: Diagnosis not present

## 2021-03-16 DIAGNOSIS — G90523 Complex regional pain syndrome I of lower limb, bilateral: Secondary | ICD-10-CM | POA: Diagnosis not present

## 2021-03-16 DIAGNOSIS — Z981 Arthrodesis status: Secondary | ICD-10-CM | POA: Diagnosis not present

## 2021-03-16 LAB — LIPID PANEL
Cholesterol: 175 mg/dL (ref 0–200)
HDL: 46 mg/dL (ref >40)
LDL Cholesterol: 111 mg/dL — ABNORMAL HIGH (ref 0–99)
Total CHOL/HDL Ratio: 3.8 RATIO
Triglycerides: 92 mg/dL (ref <150)
VLDL: 18 mg/dL (ref 0–40)

## 2021-03-16 LAB — GLUCOSE, CAPILLARY: Glucose-Capillary: 94 mg/dL (ref 70–99)

## 2021-03-16 LAB — SARS CORONAVIRUS 2 (TAT 6-24 HRS): SARS Coronavirus 2: NEGATIVE

## 2021-03-16 LAB — RAPID URINE DRUG SCREEN, HOSP PERFORMED
Amphetamines: NOT DETECTED
Barbiturates: NOT DETECTED
Benzodiazepines: NOT DETECTED
Cocaine: NOT DETECTED
Opiates: NOT DETECTED
Tetrahydrocannabinol: NOT DETECTED

## 2021-03-16 LAB — HEMOGLOBIN A1C
Hgb A1c MFr Bld: 5.8 % — ABNORMAL HIGH (ref 4.8–5.6)
Mean Plasma Glucose: 119.76 mg/dL

## 2021-03-16 MED ORDER — IOHEXOL 350 MG/ML SOLN
80.0000 mL | Freq: Once | INTRAVENOUS | Status: AC | PRN
  Administered 2021-03-16: 80 mL via INTRAVENOUS

## 2021-03-16 MED ORDER — ASPIRIN EC 81 MG PO TBEC: 81.0000 mg | DELAYED_RELEASE_TABLET | Freq: Every day | ORAL | 0 refills | Status: AC

## 2021-03-16 NOTE — Telephone Encounter (Signed)
I denied the Klonopin RF. Pt is in hospital with strokelike symptoms.

## 2021-03-16 NOTE — Procedures (Signed)
Patient Name: Becky Wolfe  MRN: [id number]  Epilepsy Attending: Lora Havens  Referring Physician/Provider: Dr. Charlynne Cousins Date: 03/16/2021  Duration: 22.48 mins  Patient history: 60 year old female with transient speech disturbance and numbness with weakness to right face arm and leg.  EEG to evaluate for seizures.  Level of alertness: Awake,drowsy  AEDs during EEG study: Klonopin  Technical aspects: This EEG study was done with scalp electrodes positioned according to the 10-20 International system of electrode placement. Electrical activity was acquired at a sampling rate of 500Hz  and reviewed with a high frequency filter of 70Hz  and a low frequency filter of 1Hz . EEG data were recorded continuously and digitally stored.   Description: The posterior dominant rhythm consists of 9 Hz activity of moderate voltage (25-35 uV) seen predominantly in posterior head regions, symmetric and reactive to eye opening and eye closing. Drowsiness was characterized by attenuation of the posterior background rhythm and roving eye movements. Hyperventilation and photic stimulation were not performed.     IMPRESSION: This study is within normal limits. No seizures or epileptiform discharges were seen throughout the recording.  Becky Wolfe

## 2021-03-16 NOTE — Care Management Obs Status (Signed)
[facility] NOTIFICATION   Patient Details  Name: Becky Wolfe MRN: [id number] Date of Birth: May 11, 1961   Medicare Observation Status Notification Given:  Yes    Joanne Chars, LCSW 03/16/2021, 2:25 PM

## 2021-03-16 NOTE — Progress Notes (Signed)
SLP Cancellation Note  Patient Details Name: Becky Wolfe MRN: [id number] DOB: 06/06/1961   Cancelled treatment:       Reason Eval/Treat Not Completed: SLP screened, no needs identified, will sign off MRI was negative for acute changes, and no overt communication or cognitive-linguistic deficits were noted by SLP during conversation or RN and pt states that her dysarthria and word retrieval difficulty have resolved. A formal evaluation does not appear to be clinically indicated at this time. SLP will s/o  Teighlor Korson I. Hardin Negus, [place], [place] Office number [phone] Pager [phone]  Horton Marshall 03/16/2021, 10:43 AM

## 2021-03-16 NOTE — Evaluation (Signed)
Occupational Therapy Evaluation Patient Details Name: Becky Wolfe MRN: [id number] DOB: 1960-11-27 Today's Date: 03/16/2021    History of Present Illness Pt admitted on 03/16/21 with slurred speech, biting side of her mouth. CT and MRI negative for acute changes. PMH: Complex Regional Pain Syndrome R LE, chronic narcotic use, HTN, cervical disc disease s/p ACDF, TIA, DVT, migraine, panic attacks, Hep C.   Clinical Impression   Pt is functioning at her baseline in ADL. Recommended shower seat and grab bars as pt had fallen in shower, declined. Pt is much safer with use of RW.     Follow Up Recommendations  No OT follow up    Equipment Recommendations   (recommended tub seat, pt declined)    Recommendations for Other Services       Precautions / Restrictions Precautions Precautions: Fall Precaution Comments: hx of a fall in the tub in December      Mobility Bed Mobility Overal bed mobility: Modified Independent                  Transfers Overall transfer level: Needs assistance Equipment used: None Transfers: Sit to/from Stand Sit to Stand: Min guard         General transfer comment: pt much steadier with RW    Balance                                           ADL either performed or assessed with clinical judgement   ADL Overall ADL's : Modified independent                                             Vision Baseline Vision/History: Wears glasses Wears Glasses: Reading only Patient Visual Report: No change from baseline       Perception     Praxis      Pertinent Vitals/Pain Pain Assessment: Faces Faces Pain Scale: Hurts even more Pain Location: R LE Pain Descriptors / Indicators: Grimacing;Guarding;Discomfort Pain Intervention(s): Monitored during session;Repositioned     Hand Dominance Right   Extremity/Trunk Assessment Upper Extremity Assessment Upper Extremity Assessment: Overall WFL for tasks  assessed   Lower Extremity Assessment Lower Extremity Assessment: Defer to PT evaluation   Cervical / Trunk Assessment Cervical / Trunk Assessment: Normal   Communication Communication Communication: No difficulties   Cognition Arousal/Alertness: Awake/alert Behavior During Therapy: WFL for tasks assessed/performed Overall Cognitive Status: Within Functional Limits for tasks assessed                                     General Comments       Exercises     Shoulder Instructions      Home Living Family/patient expects to be discharged to:: Private residence Living Arrangements: Spouse/significant other;Children Available Help at Discharge: Family;Available 24 hours/day Type of Home: House Home Access: Stairs to enter     Home Layout: One level     Bathroom Shower/Tub: Teacher, early years/pre: Standard     Home Equipment: Environmental consultant - 2 wheels;Walker - 4 wheels;Cane - single point          Prior Functioning/Environment Level of Independence: Independent  Comments: holds on to husband in the community        OT Problem List: Decreased activity tolerance;Impaired balance (sitting and/or standing);Decreased knowledge of use of DME or AE;Pain      OT Treatment/Interventions:      OT Goals(Current goals can be found in the care plan section) Acute Rehab OT Goals Patient Stated Goal: return home  OT Frequency:     Barriers to D/C:            Co-evaluation              AM-PAC OT "6 Clicks" Daily Activity     Outcome Measure Help from another person eating meals?: None Help from another person taking care of personal grooming?: None Help from another person toileting, which includes using toliet, bedpan, or urinal?: None Help from another person bathing (including washing, rinsing, drying)?: None Help from another person to put on and taking off regular upper body clothing?: None Help from another person to put on and  taking off regular lower body clothing?: None 6 Click Score: 24   End of Session Equipment Utilized During Treatment: Rolling walker  Activity Tolerance: Patient tolerated treatment well Patient left: in bed;with call bell/phone within reach;with bed alarm set  OT Visit Diagnosis: Other abnormalities of gait and mobility (R26.89);Pain                Time: 2229-7989 OT Time Calculation (min): 34 min Charges:  OT General Charges $OT Visit: 1 Visit OT Evaluation $OT Eval Low Complexity: 1 Low OT Treatments $Self Care/Home Management : 8-22 mins  Nestor Lewandowsky, OTR/L Acute Rehabilitation Services Pager: [phone] Office: [phone]  Malka So 03/16/2021, 9:06 AM

## 2021-03-16 NOTE — Progress Notes (Signed)
PT Cancellation Note  Patient Details Name: Becky Wolfe MRN: [id number] DOB: Jan 25, 1961   Cancelled Treatment:    Reason Eval/Treat Not Completed: Patient at procedure or test/unavailable - pt on EEG for 20-minute study, PT to check back.   Stacie Glaze, PT DPT Acute Rehabilitation Services Pager [phone]  Office [phone]    Fortuna Ruffin Pyo 03/16/2021, 10:09 AM

## 2021-03-16 NOTE — Care Management CC44 (Signed)
Condition Code 44 Documentation Completed  Patient Details  Name: Becky Wolfe MRN: [id number] Date of Birth: 1961-08-26   Condition Code 44 given:  Yes Patient signature on Condition Code 44 notice:  Yes Documentation of 2 MD's agreement:  Yes Code 44 added to claim:  Yes    Joanne Chars, LCSW 03/16/2021, 2:25 PM

## 2021-03-16 NOTE — Progress Notes (Signed)
EEG completed, results pending. 

## 2021-03-16 NOTE — Evaluation (Signed)
Physical Therapy Evaluation Patient Details Name: Becky Wolfe MRN: [id number] DOB: 1961/06/02 Today's Date: 03/16/2021   History of Present Illness  Pt admitted on 03/16/21 with slurred speech, biting side of her mouth. CT and MRI negative for acute changes. PMH: Complex Regional Pain Syndrome R LE, chronic narcotic use, HTN, cervical disc disease s/p ACDF, TIA, DVT, migraine, panic attacks, Hep C.  Clinical Impression   Pt presents with generalized weakness, chronic RLE pain secondary to CRPS/RSD, impaired balance with history of falls, antalgic gait, and decreased activity tolerance. Pt to benefit from acute PT to address deficits. Pt ambulated hallway distance with close guard and occasional min assist to steady during episodes of scissoring. Pt's mobility deficits are related to pt's chronic RLE dysfunction, PT feels pt is close to baseline but would benefit from further treatment in OP setting. PT educated pt on pain science education, would benefit from OPPT with PT who has experience in pain science. PT to progress mobility as tolerated, and will continue to follow acutely.      Follow Up Recommendations Outpatient PT;Supervision for mobility/OOB (pain science education and management)    Equipment Recommendations  None recommended by PT    Recommendations for Other Services       Precautions / Restrictions Precautions Precautions: Fall Precaution Comments: hx of a fall in the tub in December Restrictions Weight Bearing Restrictions: No      Mobility  Bed Mobility Overal bed mobility: Modified Independent                  Transfers Overall transfer level: Needs assistance Equipment used: None Transfers: Sit to/from Stand Sit to Stand: Min guard         General transfer comment: for safety, pt reaching for environment to self-steady upon standing.  Ambulation/Gait Ambulation/Gait assistance: Min guard;Min assist Gait Distance (Feet): 125 Feet Assistive  device: None Gait Pattern/deviations: Step-through pattern;Decreased stride length;Scissoring;Narrow base of support;Antalgic Gait velocity: decr   General Gait Details: min guard for safety, occasional min assist to steady during scissoring. Pt states this is due to RLE pain  Stairs            Wheelchair Mobility    Modified Rankin (Stroke Patients Only) Modified Rankin (Stroke Patients Only) Pre-Morbid Rankin Score: Moderate disability Modified Rankin: Moderately severe disability     Balance Overall balance assessment: Needs assistance;History of Falls Sitting-balance support: No upper extremity supported;Feet supported Sitting balance-Leahy Scale: Good     Standing balance support: No upper extremity supported;During functional activity Standing balance-Leahy Scale: Fair                               Pertinent Vitals/Pain Pain Assessment: 0-10 Pain Score: 7  Faces Pain Scale: Hurts even more Pain Location: R LE(chronic) Pain Descriptors / Indicators: Grimacing;Guarding;Discomfort Pain Intervention(s): Limited activity within patient's tolerance;Monitored during session;Repositioned    Home Living Family/patient expects to be discharged to:: Private residence Living Arrangements: Spouse/significant other;Children Available Help at Discharge: Family;Available 24 hours/day Type of Home: House Home Access: Stairs to enter     Home Layout: One level Home Equipment: Environmental consultant - 2 wheels;Walker - 4 wheels;Cane - single point      Prior Function Level of Independence: Independent         Comments: holds on to husband in the community, uses RW "when it's bad"     Hand Dominance   Dominant Hand: Right  Extremity/Trunk Assessment   Upper Extremity Assessment Upper Extremity Assessment: Defer to OT evaluation    Lower Extremity Assessment Lower Extremity Assessment: Generalized weakness;RLE deficits/detail RLE Deficits / Details: history of  reflex sympathetic dystrophy, hypersensitive to touch and painful in WB. Able to perform heel slide, gait RLE: Unable to fully assess due to pain    Cervical / Trunk Assessment Cervical / Trunk Assessment: Normal  Communication   Communication: No difficulties  Cognition Arousal/Alertness: Awake/alert Behavior During Therapy: WFL for tasks assessed/performed Overall Cognitive Status: Within Functional Limits for tasks assessed                                        General Comments      Exercises Other Exercises Other Exercises: Educated on pain science education, would benefit from OPPT with PT who has experience in pain science   Assessment/Plan    PT Assessment Patient needs continued PT services  PT Problem List Decreased strength;Decreased mobility;Decreased activity tolerance;Decreased balance;Decreased knowledge of use of DME;Pain;Impaired sensation;Decreased safety awareness       PT Treatment Interventions DME instruction;Therapeutic activities;Gait training;Therapeutic exercise;Patient/family education;Balance training;Stair training;Functional mobility training;Neuromuscular re-education    PT Goals (Current goals can be found in the Care Plan section)  Acute Rehab PT Goals Patient Stated Goal: return home PT Goal Formulation: With patient Time For Goal Achievement: 03/30/21 Potential to Achieve Goals: Good    Frequency Min 3X/week   Barriers to discharge        Co-evaluation               AM-PAC PT "6 Clicks" Mobility  Outcome Measure Help needed turning from your back to your side while in a flat bed without using bedrails?: None Help needed moving from lying on your back to sitting on the side of a flat bed without using bedrails?: None Help needed moving to and from a bed to a chair (including a wheelchair)?: A Little Help needed standing up from a chair using your arms (e.g., wheelchair or bedside chair)?: A Little Help needed  to walk in hospital room?: A Little Help needed climbing 3-5 steps with a railing? : A Little 6 Click Score: 20    End of Session Equipment Utilized During Treatment: Gait belt Activity Tolerance: Patient limited by fatigue;Patient limited by pain Patient left: in bed;with call bell/phone within reach;with bed alarm set Nurse Communication: Mobility status PT Visit Diagnosis: Other abnormalities of gait and mobility (R26.89);Difficulty in walking, not elsewhere classified (R26.2)    Time: 7829-5621 PT Time Calculation (min) (ACUTE ONLY): 22 min   Charges:   PT Evaluation $PT Eval Low Complexity: 1 Low        Eliud Polo S, PT DPT Acute Rehabilitation Services Pager [phone]  Office [phone]   [place] E Ruffin Pyo 03/16/2021, 11:45 AM

## 2021-03-16 NOTE — Progress Notes (Addendum)
STROKE TEAM PROGRESS NOTE  60 y.o. female presenting with transient difficulty with speech in addition to sensory numbness and weakness to right face, arm and leg. INTERVAL HISTORY No acute events since arrival. No visitors at bedside  Today patient feels back to her baseline. She reports all her speech and sensory changes in her face and arm has resolved. Chronic RSD sensory changes of her RLE persist. She does report she has migraines. She does not take her 81mg  ASA every day at home because sometimes it irritates her stomach. We discussed her ongoing work up and plan of care. Questions were addressed.   Vitals:   03/16/21 0116 03/16/21 0257 03/16/21 0423 03/16/21 0630  BP: (!) 147/52 135/68 (!) 166/50 (!) 151/66  Pulse: (!) 51 (!) 55 (!) 52 (!) 51  Resp:      Temp: 97.9 F (36.6 C) 98 F (36.7 C) 97.8 F (36.6 C) 97.8 F (36.6 C)  TempSrc: Oral Oral Oral Oral  SpO2: 99% 97% 98% 98%   CBC:  Recent Labs  Lab 03/15/21 1118 03/15/21 1132 03/15/21 2129  WBC 4.2  --  3.5*  NEUTROABS 2.2  --   --   HGB 12.4 11.2* 12.4  HCT 36.1 33.0* 35.6*  MCV 87.4  --  85.6  PLT 205  --  237   Basic Metabolic Panel:  Recent Labs  Lab 03/15/21 1118 03/15/21 1132 03/15/21 2129  NA 141 139  --   K 3.9 4.7  --   CL 108 108  --   CO2 24  --   --   GLUCOSE 99 103*  --   BUN 9 11  --   CREATININE 0.83 0.70 0.85  CALCIUM 9.1  --   --    Lipid Panel:  Recent Labs  Lab 03/16/21 0338  CHOL 175  TRIG 92  HDL 46  CHOLHDL 3.8  VLDL 18  LDLCALC 111*   HgbA1c:  Recent Labs  Lab 03/16/21 0338  HGBA1C 5.8*   Urine Drug Screen:  Recent Labs  Lab 03/16/21 0406  LABOPIA NONE DETECTED  COCAINSCRNUR NONE DETECTED  LABBENZ NONE DETECTED  AMPHETMU NONE DETECTED  THCU NONE DETECTED  LABBARB NONE DETECTED    Alcohol Level No results for input(s): ETH in the last 168 hours.  IMAGING past 24 hours CT ANGIO HEAD NECK W WO CM  Result Date: 03/16/2021 CLINICAL DATA:  Stroke workup EXAM:  CT ANGIOGRAPHY HEAD AND NECK TECHNIQUE: Multidetector CT imaging of the head and neck was performed using the standard protocol during bolus administration of intravenous contrast. Multiplanar CT image reconstructions and MIPs were obtained to evaluate the vascular anatomy. Carotid stenosis measurements (when applicable) are obtained utilizing NASCET criteria, using the distal internal carotid diameter as the denominator. CONTRAST:  71mL OMNIPAQUE IOHEXOL 350 MG/ML SOLN COMPARISON:  Brain MRI from yesterday FINDINGS: CT HEAD FINDINGS Brain: No evidence of acute infarction, hemorrhage, hydrocephalus, extra-axial collection or mass lesion/mass effect. Chronic small vessel ischemia in the cerebral white matter which is extensive. Vascular: See below Skull: Negative Sinuses: Essentially clear Orbits: Negative Review of the MIP images confirms the above findings CTA NECK FINDINGS Aortic arch: No acute finding. Right carotid system: No stenosis, ulceration, or beading. No significant atheromatous change. Left carotid system: No stenosis, ulceration, or beading. No significant atheromatous change. Vertebral arteries: No proximal subclavian stenosis. Strong right vertebral artery dominance. Both vertebral arteries are smooth and widely patent to the dura when allowing for intermittent streak artifact from metal.  Skeleton: Multilevel ACDF with solid arthrodesis. Other neck: No acute finding Upper chest: Negative Review of the MIP images confirms the above findings CTA HEAD FINDINGS Anterior circulation: No branch occlusion or flow limiting stenosis. Negative for aneurysm or vascular malformation. No vessel beading noted. Posterior circulation: Right dominant vertebral artery. The vertebral and basilar arteries are smooth and widely patent. No branch occlusion, beading, or flow limiting stenosis. Negative for aneurysm. Venous sinuses: Unremarkable in the arterial phase. Anatomic variants: None significant Review of the MIP  images confirms the above findings IMPRESSION: No emergent finding.  No significant atherosclerosis. Electronically Signed   By: Monte Fantasia M.D.   On: 03/16/2021 05:34   CT HEAD WO CONTRAST  Result Date: 03/15/2021 CLINICAL DATA:  Slurred speech. Bilateral temporal headache. Heaviness in both lower extremities. Symptoms beginning last night. EXAM: CT HEAD WITHOUT CONTRAST TECHNIQUE: Contiguous axial images were obtained from the base of the skull through the vertex without intravenous contrast. COMPARISON:  10/17/2020 FINDINGS: Brain: No evidence of acute infarction, hemorrhage, hydrocephalus, extra-axial collection or mass lesion/mass effect. Patchy white matter hypoattenuation is noted consistent with moderate chronic microvascular ischemic change, stable from prior head CT. Vascular: No hyperdense vessel or unexpected calcification. Skull: Normal. Negative for fracture or focal lesion. Sinuses/Orbits: Globes and orbits are unremarkable. Mild mucosal thickening most evident along the ethmoids. Other: None. IMPRESSION: 1. No acute intracranial abnormalities. 2. Moderate chronic microvascular ischemic change. Electronically Signed   By: Lajean Manes M.D.   On: 03/15/2021 13:04   MR BRAIN WO CONTRAST  Result Date: 03/15/2021 CLINICAL DATA:  Initial evaluation for acute TIA. EXAM: MRI HEAD WITHOUT CONTRAST TECHNIQUE: Multiplanar, multiecho pulse sequences of the brain and surrounding structures were obtained without intravenous contrast. COMPARISON:  Prior head CT from earlier the same day. FINDINGS: Brain: Examination moderately degraded by motion artifact. Cerebral volume within normal limits for age. Scattered patchy and confluent T2/FLAIR hyperintensity noted involving the periventricular, deep, and subcortical white matter both cerebral hemispheres, nonspecific, but most like related chronic microvascular ischemic disease, moderate in nature. No abnormal foci of restricted diffusion to suggest acute  or subacute ischemia. Gray-white matter differentiation maintained. No visible areas of encephalomalacia to suggest chronic cortical infarction. No foci of susceptibility artifact to suggest acute or chronic intracranial hemorrhage. No mass lesion, midline shift or mass effect. Ventricles normal size without hydrocephalus. No extra-axial fluid collection. Pituitary gland not well assessed on this motion degraded exam. Vascular: Major intracranial vascular flow voids are grossly maintained. Skull and upper cervical spine: Craniocervical junction grossly within normal limits. Bone marrow signal intensity grossly normal. No focal marrow replacing lesion. No scalp soft tissue abnormality. Sinuses/Orbits: Globes and orbital soft tissues grossly within normal limits. Mild scattered mucosal thickening noted throughout the paranasal sinuses. No mastoid effusion. Inner ear structures grossly normal. Other: None. IMPRESSION: 1. No acute intracranial infarct or other abnormality. 2. Moderately advanced chronic microvascular ischemic disease for age. Electronically Signed   By: Jeannine Boga M.D.   On: 03/15/2021 19:22   PHYSICAL EXAM Mental Status: Awake and alert. Conversant. Oriented x 5. Speech fluent with intact naming and comprehension.  Cranial Nerves: II:  Temporal visual fields intact with no extinction to DSS. PERRL.  III,IV, VI: No ptosis. EOMI. No nystagmus.  V,VII: Smile symmetric, facial temp sensation equal bilaterally VIII: hearing intact to voice IX,X: No pharyngeal dysarthria XI: Symmetric XII: Midline tongue extension  Motor: RUE and LUE:  Full strength and symmetric bilaterally.  No pronator drift.  RLE: Compromised  by RSD pain. Max strength elicitable to hip flexion and knee extension is 4/5. ADF 5/5. Cannot plantar flex against resistance due to severe pain.  LLE: 5/5 proximally and distally Sensory: Temp and light touch intact to FT and temp x 4, except for RLE which exhibits  allodynia, hyperalgesia and dysesthesia.  Cerebellar: No ataxia with FNF bilaterally  Gait: Deferred  ASSESSMENT/PLAN Ms. LOVELYN SHEERAN is a 60 y.o. female with history of  Anxiety with panic attacks, migraine type Headaches, RSD-right leg on chronic opioids, TIA 2009, current every day smoking presenting with:  Stroke like episode consisting of transient difficulty with speech in addition to sensory numbness and weakness to right face, arm and leg. Complicated migraine or anxiety episode may explain her symptoms.    Code Stroke CT head No acute abnormality.   CTA head & neck: No emergent finding  MRI: No acute finding   2D Echo: Has outpt Echo already scheduled 5/12   LDL 111  HgbA1c 5.8    Diet   Diet Heart Room service appropriate? Yes; Fluid consistency: Thin   On ASA 81mg  at home  Continue EC ASA 81mg   Therapy recommendations:  TBD  Disposition:  TBD  Hypertension  Stable . Long-term BP goal normotensive  Hyperlipidemia  LDL 111, not at goal < 100  Consider starting statin therapy   Other Stroke Risk Factors  Advanced Age >/= 73   Current Cigarette smoker, advised to stop smoking and seek PCP assistance for continued cessation efforts.She was agreeable.   Migraines  Other Active Problems  Hospital day # 1  Delila A Bailey-Modzik, NP-C   ATTENDING NOTE: I reviewed above note and agree with the assessment and plan.   60 year old female with history of anxiety, status post cervical ACDF, TIA, RSD, depression, DVT, migraine, hep C admitted for episode of slurred speech, right-sided weakness numbness, difficulty getting words out, swallowing difficulty.  CT negative.  MRI negative.  LE venous Doppler 5/3 no DVT.  2D echo pending 03/22/2021 as outpatient.  LDL 111, A1c 5.8.  UDS negative.  Creatinine 0.70.  CT head and neck unremarkable.  Neuro exam and nonfocal except right lower extremity compromised by RSD pain.  Etiology of patient's symptoms likely  anxiety related.  Continue home aspirin and follow-up with PCP for mildly elevated LDL above goal.  PT/OT recommend outpatient PT.  No further recommendation from neuro standpoint.  For detailed assessment and plan, please refer to above as I have made changes wherever appropriate.   Neurology will sign off. Please call with questions.  No stroke neuro follow-up needed at this time.  Thanks for the consult.   Rosalin Hawking, MD PhD Stroke Neurology 03/17/2021 9:54 AM     To contact Stroke Continuity provider, please refer to [email or web address] After hours, contact General Neurology

## 2021-03-16 NOTE — Discharge Summary (Signed)
Physician Discharge Summary  Becky Wolfe:[id number] DOB: September 05, 1961 DOA: 03/15/2021  PCP: Mayra Neer, MD  Admit date: 03/15/2021 Discharge date: 03/16/2021  Admitted From: Home Disposition:  Home  Recommendations for Outpatient Follow-up:  1. Follow up with PCP in 2-3 weeks 2. Follow up with Neurology as scheduled  Discharge Condition:Stable CODE STATUS:Full Diet recommendation: Heart healthy   Brief/Interim Summary: 60 y.o. female with medical history significant of RSD over right lower extremity, chronic pain narcotics essential hypertension comes into the hospital for slurred speech and biting of the tongue that began the night prior to admission and lasted more than an hour as per the husband he relates she could not get her words out as she was having trouble finding words, her husband apparently noticed some left-sided facial droop, which is now improved.  She denies any headache weakness in her arms or legs no trouble swallowing.  He also relates that she was biting on her left cheek, which she relates is because her mouth was dry.  She did not lose control of her sphincters and she was awake alert and oriented during this time  Discharge Diagnoses:  Active Problems:   Chronic pain disorder   RSD lower limb   Slurred speech  Slurred speech: -Pt was seen by Neurology -Pt underwent MRI that was found to be neg for acute infarct -CT head reviewed, unremarkable -EEG was performed and reviewed. Neg for seizure activity -Case discussed with Neurology. Pt will be scheduled close outpatient f/u with Neurology after discharge.  -Neurology recommendation for continued EC ASA 78m -outpatient PT recommended by PT eval  Chronic pain disorder -Pt was continued on her home dose of analgesia  RSD lower limb -Pt was continued on her gabapentin and Tapentadol.   Discharge Instructions  Discharge Instructions    Ambulatory referral to Neurology   Complete by: As directed     An appointment is requested in approximately: 6 weeks, NP appt ok     Allergies as of 03/16/2021      Reactions   Etodolac Swelling, Other (See Comments)   Facial swelling   Ambien [zolpidem Tartrate] Other (See Comments)   Headaches, memory loss   Namenda [memantine] Other (See Comments)   Headaches       Medication List    STOP taking these medications   aspirin 81 MG chewable tablet Replaced by: aspirin EC 81 MG tablet     TAKE these medications   aspirin EC 81 MG tablet Take 1 tablet (81 mg total) by mouth daily. Swallow whole. Replaces: aspirin 81 MG chewable tablet   clonazePAM 0.5 MG tablet Commonly known as: KLONOPIN TAKE 1 TABLET(0.5 MG) BY MOUTH TWICE DAILY AS NEEDED FOR ANXIETY What changed: See the new instructions.   cloNIDine 0.1 MG tablet Commonly known as: CATAPRES Take 0.1 mg by mouth 2 (two) times daily.   cyclobenzaprine 5 MG tablet Commonly known as: FLEXERIL Take 1 tablet (5 mg total) by mouth every 8 (eight) hours as needed for muscle spasms. What changed: when to take this   gabapentin 800 MG tablet Commonly known as: NEURONTIN Take 800 mg by mouth 4 (four) times daily.   naloxone 4 MG/0.1ML Liqd nasal spray kit Commonly known as: NARCAN Place 0.4 mg into the nose once.   Nucynta 100 MG Tabs Generic drug: Tapentadol HCl Take 100 mg by mouth every 6 (six) hours.   omeprazole 40 MG capsule Commonly known as: PRILOSEC Take 40 mg by mouth daily.  sertraline 100 MG tablet Commonly known as: ZOLOFT Take 2 tablets (200 mg total) by mouth daily.       Follow-up Information    Garvin Fila, MD Follow up.   Specialties: Neurology, Radiology Contact information: [address] [place] [address] [phone]        Mayra Neer, MD. Schedule an appointment as soon as possible for a visit in 2 week(s).   Specialty: Family Medicine Contact information: [address] [place]   [address] [phone]              Allergies  Allergen Reactions  . Etodolac Swelling and Other (See Comments)    Facial swelling   . Ambien [Zolpidem Tartrate] Other (See Comments)    Headaches, memory loss   . Namenda [Memantine] Other (See Comments)    Headaches     Consultations:  Neurology  Procedures/Studies: CT ANGIO HEAD NECK W WO CM  Result Date: 03/16/2021 CLINICAL DATA:  Stroke workup EXAM: CT ANGIOGRAPHY HEAD AND NECK TECHNIQUE: Multidetector CT imaging of the head and neck was performed using the standard protocol during bolus administration of intravenous contrast. Multiplanar CT image reconstructions and MIPs were obtained to evaluate the vascular anatomy. Carotid stenosis measurements (when applicable) are obtained utilizing NASCET criteria, using the distal internal carotid diameter as the denominator. CONTRAST:  58m OMNIPAQUE IOHEXOL 350 MG/ML SOLN COMPARISON:  Brain MRI from yesterday FINDINGS: CT HEAD FINDINGS Brain: No evidence of acute infarction, hemorrhage, hydrocephalus, extra-axial collection or mass lesion/mass effect. Chronic small vessel ischemia in the cerebral white matter which is extensive. Vascular: See below Skull: Negative Sinuses: Essentially clear Orbits: Negative Review of the MIP images confirms the above findings CTA NECK FINDINGS Aortic arch: No acute finding. Right carotid system: No stenosis, ulceration, or beading. No significant atheromatous change. Left carotid system: No stenosis, ulceration, or beading. No significant atheromatous change. Vertebral arteries: No proximal subclavian stenosis. Strong right vertebral artery dominance. Both vertebral arteries are smooth and widely patent to the dura when allowing for intermittent streak artifact from metal. Skeleton: Multilevel ACDF with solid arthrodesis. Other neck: No acute finding Upper chest: Negative Review of the MIP images confirms the above findings CTA HEAD FINDINGS Anterior circulation:  No branch occlusion or flow limiting stenosis. Negative for aneurysm or vascular malformation. No vessel beading noted. Posterior circulation: Right dominant vertebral artery. The vertebral and basilar arteries are smooth and widely patent. No branch occlusion, beading, or flow limiting stenosis. Negative for aneurysm. Venous sinuses: Unremarkable in the arterial phase. Anatomic variants: None significant Review of the MIP images confirms the above findings IMPRESSION: No emergent finding.  No significant atherosclerosis. Electronically Signed   By: JMonte FantasiaM.D.   On: 03/16/2021 05:34   CT HEAD WO CONTRAST  Result Date: 03/15/2021 CLINICAL DATA:  Slurred speech. Bilateral temporal headache. Heaviness in both lower extremities. Symptoms beginning last night. EXAM: CT HEAD WITHOUT CONTRAST TECHNIQUE: Contiguous axial images were obtained from the base of the skull through the vertex without intravenous contrast. COMPARISON:  10/17/2020 FINDINGS: Brain: No evidence of acute infarction, hemorrhage, hydrocephalus, extra-axial collection or mass lesion/mass effect. Patchy white matter hypoattenuation is noted consistent with moderate chronic microvascular ischemic change, stable from prior head CT. Vascular: No hyperdense vessel or unexpected calcification. Skull: Normal. Negative for fracture or focal lesion. Sinuses/Orbits: Globes and orbits are unremarkable. Mild mucosal thickening most evident along the ethmoids. Other: None. IMPRESSION: 1. No acute intracranial abnormalities. 2. Moderate chronic microvascular ischemic  change. Electronically Signed   By: Lajean Manes M.D.   On: 03/15/2021 13:04   MR BRAIN WO CONTRAST  Result Date: 03/15/2021 CLINICAL DATA:  Initial evaluation for acute TIA. EXAM: MRI HEAD WITHOUT CONTRAST TECHNIQUE: Multiplanar, multiecho pulse sequences of the brain and surrounding structures were obtained without intravenous contrast. COMPARISON:  Prior head CT from earlier the same  day. FINDINGS: Brain: Examination moderately degraded by motion artifact. Cerebral volume within normal limits for age. Scattered patchy and confluent T2/FLAIR hyperintensity noted involving the periventricular, deep, and subcortical white matter both cerebral hemispheres, nonspecific, but most like related chronic microvascular ischemic disease, moderate in nature. No abnormal foci of restricted diffusion to suggest acute or subacute ischemia. Gray-white matter differentiation maintained. No visible areas of encephalomalacia to suggest chronic cortical infarction. No foci of susceptibility artifact to suggest acute or chronic intracranial hemorrhage. No mass lesion, midline shift or mass effect. Ventricles normal size without hydrocephalus. No extra-axial fluid collection. Pituitary gland not well assessed on this motion degraded exam. Vascular: Major intracranial vascular flow voids are grossly maintained. Skull and upper cervical spine: Craniocervical junction grossly within normal limits. Bone marrow signal intensity grossly normal. No focal marrow replacing lesion. No scalp soft tissue abnormality. Sinuses/Orbits: Globes and orbital soft tissues grossly within normal limits. Mild scattered mucosal thickening noted throughout the paranasal sinuses. No mastoid effusion. Inner ear structures grossly normal. Other: None. IMPRESSION: 1. No acute intracranial infarct or other abnormality. 2. Moderately advanced chronic microvascular ischemic disease for age. Electronically Signed   By: Jeannine Boga M.D.   On: 03/15/2021 19:22   EEG adult  Result Date: 03/16/2021 Lora Havens, MD     03/16/2021 10:54 AM Patient Name: Becky Wolfe MRN: [id number] Epilepsy Attending: Lora Havens Referring Physician/Provider: Dr. Charlynne Cousins Date: 03/16/2021 Duration: 22.48 mins Patient history: 60 year old female with transient speech disturbance and numbness with weakness to right face arm and leg.  EEG to  evaluate for seizures. Level of alertness: Awake,drowsy AEDs during EEG study: Klonopin Technical aspects: This EEG study was done with scalp electrodes positioned according to the 10-20 International system of electrode placement. Electrical activity was acquired at a sampling rate of 500Hz and reviewed with a high frequency filter of 70Hz and a low frequency filter of 1Hz. EEG data were recorded continuously and digitally stored. Description: The posterior dominant rhythm consists of 9 Hz activity of moderate voltage (25-35 uV) seen predominantly in posterior head regions, symmetric and reactive to eye opening and eye closing. Drowsiness was characterized by attenuation of the posterior background rhythm and roving eye movements. Hyperventilation and photic stimulation were not performed.   IMPRESSION: This study is within normal limits. No seizures or epileptiform discharges were seen throughout the recording. Priyanka Barbra Sarks     Subjective: Asking about going home  Discharge Exam: Vitals:   03/16/21 1207 03/16/21 1609  BP: (!) 150/58 (!) 145/76  Pulse: (!) 51 68  Resp: 18 16  Temp: 98.4 F (36.9 C) 98.2 F (36.8 C)  SpO2: 100% 99%   Vitals:   03/16/21 0423 03/16/21 0630 03/16/21 1207 03/16/21 1609  BP: (!) 166/50 (!) 151/66 (!) 150/58 (!) 145/76  Pulse: (!) 52 (!) 51 (!) 51 68  Resp:   18 16  Temp: 97.8 F (36.6 C) 97.8 F (36.6 C) 98.4 F (36.9 C) 98.2 F (36.8 C)  TempSrc: Oral Oral Oral   SpO2: 98% 98% 100% 99%    General: Pt is alert, awake, not in  acute distress Cardiovascular: RRR, S1/S2 +, no rubs, no gallops Respiratory: CTA bilaterally, no wheezing, no rhonchi Abdominal: Soft, NT, ND, bowel sounds + Extremities: no edema, no cyanosis   The results of significant diagnostics from this hospitalization (including imaging, microbiology, ancillary and laboratory) are listed below for reference.     Microbiology: Recent Results (from the past 240 hour(s))  SARS  CORONAVIRUS 2 (TAT 6-24 HRS) Nasopharyngeal Nasopharyngeal Swab     Status: None   Collection Time: 03/15/21  4:56 PM   Specimen: Nasopharyngeal Swab  Result Value Ref Range Status   SARS Coronavirus 2 NEGATIVE NEGATIVE Final    Comment: (NOTE) SARS-CoV-2 target nucleic acids are NOT DETECTED.  The SARS-CoV-2 RNA is generally detectable in upper and lower respiratory specimens during the acute phase of infection. Negative results do not preclude SARS-CoV-2 infection, do not rule out co-infections with other pathogens, and should not be used as the sole basis for treatment or other patient management decisions. Negative results must be combined with clinical observations, patient history, and epidemiological information. The expected result is Negative.  Fact Sheet for Patients: SugarRoll.be  Fact Sheet for Healthcare Providers: https://www.woods-mathews.com/  This test is not yet approved or cleared by the [place] FDA and  has been authorized for detection and/or diagnosis of SARS-CoV-2 by FDA under an Emergency Use Authorization (EUA). This EUA will remain  in effect (meaning this test can be used) for the duration of the COVID-19 declaration under Se ction 564(b)(1) of the Act, 21 U.S.C. section 360bbb-3(b)(1), unless the authorization is terminated or revoked sooner.  Performed at [facility], [place] [address] [place], [place] [address]      Labs: BNP (last 3 results) No results for input(s): BNP in the last 8760 hours. Basic Metabolic Panel: Recent Labs  Lab 03/15/21 1118 03/15/21 1132 03/15/21 2129  NA 141 139  --   K 3.9 4.7  --   CL 108 108  --   CO2 24  --   --   GLUCOSE 99 103*  --   BUN 9 11  --   CREATININE 0.83 0.70 0.85  CALCIUM 9.1  --   --    Liver Function Tests: Recent Labs  Lab 03/15/21 1118  AST 26  ALT 20  ALKPHOS 79  BILITOT 0.4  PROT 6.9  ALBUMIN 4.0   No results for input(s):  LIPASE, AMYLASE in the last 168 hours. No results for input(s): AMMONIA in the last 168 hours. CBC: Recent Labs  Lab 03/15/21 1118 03/15/21 1132 03/15/21 2129  WBC 4.2  --  3.5*  NEUTROABS 2.2  --   --   HGB 12.4 11.2* 12.4  HCT 36.1 33.0* 35.6*  MCV 87.4  --  85.6  PLT 205  --  190   Cardiac Enzymes: No results for input(s): CKTOTAL, CKMB, CKMBINDEX, TROPONINI in the last 168 hours. BNP: Invalid input(s): POCBNP CBG: Recent Labs  Lab 03/15/21 1541 03/16/21 0748  GLUCAP 78 94   D-Dimer No results for input(s): DDIMER in the last 72 hours. Hgb A1c Recent Labs    03/16/21 0338  HGBA1C 5.8*   Lipid Profile Recent Labs    03/16/21 0338  CHOL 175  HDL 46  LDLCALC 111*  TRIG 92  CHOLHDL 3.8   Thyroid function studies No results for input(s): TSH, T4TOTAL, T3FREE, THYROIDAB in the last 72 hours.  Invalid input(s): FREET3 Anemia work up No results for input(s): VITAMINB12, FOLATE, FERRITIN, TIBC, IRON, RETICCTPCT  in the last 72 hours. Urinalysis    Component Value Date/Time   COLORURINE YELLOW 03/14/2019 1746   APPEARANCEUR HAZY (A) 03/14/2019 1746   LABSPEC 1.017 03/14/2019 1746   PHURINE 5.0 03/14/2019 1746   GLUCOSEU NEGATIVE 03/14/2019 1746   HGBUR NEGATIVE 03/14/2019 1746   BILIRUBINUR NEGATIVE 03/14/2019 1746   KETONESUR NEGATIVE 03/14/2019 1746   PROTEINUR NEGATIVE 03/14/2019 1746   UROBILINOGEN 1.0 09/02/2012 1917   NITRITE NEGATIVE 03/14/2019 1746   LEUKOCYTESUR LARGE (A) 03/14/2019 1746   Sepsis Labs Invalid input(s): PROCALCITONIN,  WBC,  LACTICIDVEN Microbiology Recent Results (from the past 240 hour(s))  SARS CORONAVIRUS 2 (TAT 6-24 HRS) Nasopharyngeal Nasopharyngeal Swab     Status: None   Collection Time: 03/15/21  4:56 PM   Specimen: Nasopharyngeal Swab  Result Value Ref Range Status   SARS Coronavirus 2 NEGATIVE NEGATIVE Final    Comment: (NOTE) SARS-CoV-2 target nucleic acids are NOT DETECTED.  The SARS-CoV-2 RNA is generally  detectable in upper and lower respiratory specimens during the acute phase of infection. Negative results do not preclude SARS-CoV-2 infection, do not rule out co-infections with other pathogens, and should not be used as the sole basis for treatment or other patient management decisions. Negative results must be combined with clinical observations, patient history, and epidemiological information. The expected result is Negative.  Fact Sheet for Patients: SugarRoll.be  Fact Sheet for Healthcare Providers: https://www.woods-mathews.com/  This test is not yet approved or cleared by the [place] FDA and  has been authorized for detection and/or diagnosis of SARS-CoV-2 by FDA under an Emergency Use Authorization (EUA). This EUA will remain  in effect (meaning this test can be used) for the duration of the COVID-19 declaration under Se ction 564(b)(1) of the Act, 21 U.S.C. section 360bbb-3(b)(1), unless the authorization is terminated or revoked sooner.  Performed at [facility], [place] [address] [place], [place] [address]    Time spent: 49mn  SIGNED:   SMarylu Lund MD  [facility] 03/16/2021, 4:16 PM  If 7PM-7AM, please contact night-coverage

## 2021-03-22 ENCOUNTER — Other Ambulatory Visit: Payer: Self-pay

## 2021-03-22 ENCOUNTER — Ambulatory Visit (HOSPITAL_COMMUNITY): Payer: Medicare Other | Attending: Cardiology

## 2021-03-22 DIAGNOSIS — R011 Cardiac murmur, unspecified: Secondary | ICD-10-CM | POA: Diagnosis not present

## 2021-03-22 LAB — ECHOCARDIOGRAM COMPLETE
AR max vel: 2.47 cm2
AV Area VTI: 2.53 cm2
AV Area mean vel: 2.25 cm2
AV Mean grad: 6 mmHg
AV Peak grad: 10.8 mmHg
Ao pk vel: 1.64 m/s
Area-P 1/2: 2.93 cm2
P 1/2 time: 476 msec
S' Lateral: 2.5 cm

## 2021-03-23 ENCOUNTER — Ambulatory Visit: Payer: Medicare Other | Admitting: Occupational Therapy

## 2021-03-27 ENCOUNTER — Ambulatory Visit: Payer: Medicare Other | Admitting: Physical Therapy

## 2021-03-27 DIAGNOSIS — G90522 Complex regional pain syndrome I of left lower limb: Secondary | ICD-10-CM | POA: Diagnosis not present

## 2021-04-03 ENCOUNTER — Ambulatory Visit: Payer: Medicare Other | Attending: Internal Medicine

## 2021-04-13 DIAGNOSIS — Z720 Tobacco use: Secondary | ICD-10-CM | POA: Diagnosis not present

## 2021-04-13 DIAGNOSIS — G43109 Migraine with aura, not intractable, without status migrainosus: Secondary | ICD-10-CM | POA: Diagnosis not present

## 2021-04-13 DIAGNOSIS — G9050 Complex regional pain syndrome I, unspecified: Secondary | ICD-10-CM | POA: Diagnosis not present

## 2021-04-13 DIAGNOSIS — R471 Dysarthria and anarthria: Secondary | ICD-10-CM | POA: Diagnosis not present

## 2021-04-13 DIAGNOSIS — Z79899 Other long term (current) drug therapy: Secondary | ICD-10-CM | POA: Diagnosis not present

## 2021-04-13 DIAGNOSIS — I10 Essential (primary) hypertension: Secondary | ICD-10-CM | POA: Diagnosis not present

## 2021-04-16 ENCOUNTER — Encounter: Payer: Self-pay | Admitting: Physical Therapy

## 2021-04-16 ENCOUNTER — Other Ambulatory Visit: Payer: Self-pay

## 2021-04-16 ENCOUNTER — Ambulatory Visit: Payer: Medicare Other | Attending: Internal Medicine | Admitting: Physical Therapy

## 2021-04-16 DIAGNOSIS — M6281 Muscle weakness (generalized): Secondary | ICD-10-CM | POA: Diagnosis not present

## 2021-04-16 DIAGNOSIS — M79604 Pain in right leg: Secondary | ICD-10-CM | POA: Diagnosis not present

## 2021-04-16 DIAGNOSIS — R262 Difficulty in walking, not elsewhere classified: Secondary | ICD-10-CM | POA: Diagnosis not present

## 2021-04-16 NOTE — Therapy (Signed)
[place]. [place], [place], [address] Phone: [phone]   Fax:  [phone]  Physical Therapy Evaluation  Patient Details  Name: Becky Wolfe MRN: [id number] Date of Birth: January 07, 1961 Referring Provider (PT): Janey Genta Date: 04/16/2021   PT End of Session - 04/16/21 1301    Visit Number 1    Date for PT Re-Evaluation 06/16/21    PT Start Time 1216    PT Stop Time 1254    PT Time Calculation (min) 38 min    Activity Tolerance Patient limited by pain    Behavior During Therapy Anxious           Past Medical History:  Diagnosis Date  . Anxiety 06-16-13   hx. panic attacks, none recent  . Blood transfusion without reported diagnosis    20 yrs ago after childbirth  . Bronchitis    hx of  . Chronic neck pain   . Constipation   . Depression   . DVT (deep venous thrombosis) ([place]) 2015   rt. leg   . Headache(784.0)    migraines-has decreased  . Heart murmur    Dr. Etter Sjogren  . Hepatitis C    dx. Hep. C(s/p transfusion age 89) low Hgb  .-multiple transfusions. Treated successfully  . Multiple sclerosis ([place])    dx. -35yrs ago, then med stopped-due to liver enzymes changes,occ. periods of weakness in arms"d.  was seen at [facility] and tolded that she does not have MSrops things"  . Pneumonia    hx of , ? mild emphysema on CT scan 5'14, multiple pulmonary nodules  . RSD (reflex sympathetic dystrophy) 06-16-13   legs  . RSD (reflex sympathetic dystrophy) 2009  . TIA (transient ischemic attack)    hx of (weakness left side)-none in 1 yr    Past Surgical History:  Procedure Laterality Date  . ABDOMINAL HYSTERECTOMY    . ANAL RECTAL MANOMETRY N/A 04/11/2015   Procedure: ANO RECTAL MANOMETRY;  Surgeon: Arta Silence, MD;  [facility];  Service: Endoscopy;  Laterality: N/A;  . ANAL RECTAL MANOMETRY N/A 05/21/2017   Procedure: ANO RECTAL MANOMETRY;  Surgeon: Arta Silence, MD;  [facility];  Service: Endoscopy;  Laterality: N/A;  . ANKLE SURGERY Right    no retained hardware  . ANTERIOR CERVICAL DECOMP/DISCECTOMY FUSION  09/21/2012   Procedure: ANTERIOR CERVICAL DECOMPRESSION/DISCECTOMY FUSION 2 LEVELS;  Surgeon: Otilio Connors, MD;  Location: [facility];  Service: Neurosurgery;  Laterality: N/A;  Cervical five-six, cervical seven-thoracic one Anterior cervical decompression/diskectomy/fusion/Lifenet bone/trestle plate/Prior Cervical six--seven Anterior cervical fusion/Removing trestle plate  . ANTERIOR CERVICAL DECOMP/DISCECTOMY FUSION N/A 08/05/2018   Procedure: Cervical four-five Anterior cervical decompression/discectomy/fusion;  Surgeon: Judith Part, MD;  Location: [facility];  Service: Neurosurgery;  Laterality: N/A;  . APPENDECTOMY    . CESAREAN SECTION     twice  . COLONOSCOPY    . ESOPHAGOGASTRODUODENOSCOPY (EGD) WITH PROPOFOL N/A 06/30/2013   Procedure: ESOPHAGOGASTRODUODENOSCOPY (EGD) WITH PROPOFOL;  Surgeon: Arta Silence, MD;  [facility];  Service: Endoscopy;  Laterality: N/A;  . NECK SURGERY    . WEDGE RESECTION     left ovary with cyst removed ,hx. multiple cysts removed prior    There were no vitals filed for this visit.    Subjective Assessment - 04/16/21 1218    Subjective Pt reports to clinic after hosptial discharge 03/16/21. Pt reports that she had R sided weakness, slurring of speech, and biting  of side of mouth. Presented to ED but imaging was negative for stroke. Still suspects potential TIA. Pt has hx of RSD since 2009; this causes extreme sensitivy of RLE, stiffness RLE, daily pain RLE. Pt has tingling in RLE and nerve pain at baseline; sensitive to touch over RLE. Pt states that walking is pretty much back to baseline for her but states she has been having some balance difficulties. States she has had one fall since coming back home from hospital; feels very unsteady walking around.    Pertinent History RSD (2009), potential MS  (unclear diagnosis), DVT in RLE (2015), anxiety/depression    Limitations Standing;Walking;House hold activities    Diagnostic tests CT of head    Patient Stated Goals improve balance, decrease pain    Currently in Pain? Yes    Pain Score 8     Pain Location Leg    Pain Orientation Right    Pain Descriptors / Indicators Tingling;Burning;Spasm    Pain Type Chronic pain    Pain Radiating Towards RLE and R foot    Pain Onset More than a month ago    Pain Frequency Constant    Aggravating Factors  touch to RLE, cold air against leg, pressure on leg, temperature changes    Pain Relieving Factors pain meds              [facility] PT Assessment - 04/16/21 0001      Assessment   Medical Diagnosis balance difficulties, RSD with RLE pain    Referring Provider (PT) Brigitte Pulse    Onset Date/Surgical Date --   hospital d/c 03/16/21; RSD since 2009   Next MD Visit --   September 2022   Prior Therapy PT for RSD      Precautions   Precautions Fall      Restrictions   Weight Bearing Restrictions No      Balance Screen   Has the patient fallen in the past 6 months Yes    How many times? 1    Has the patient had a decrease in activity level because of a fear of falling?  Yes    Is the patient reluctant to leave their home because of a fear of falling?  Yes      Home Environment   Additional Comments steps to enter home and into basement; reports no trouble with stairs      Prior Function   Level of Independence Independent    Leisure dancing      Sensation   Light Touch --   not tested d/t sensitivity to touch     Functional Tests   Functional tests Sit to Stand      Sit to Stand   Comments narrow stance, cues for widened BOS, occasional posterior LOB with STS. Slow, guarded movements      ROM / Strength   AROM / PROM / Strength Strength      Strength   Overall Strength Comments unable to accurately assess strength; pt unable to tolerate pressure of touch for MMT. Able to move against  gravity for BLE MMT      Ambulation/Gait   Gait Comments antalgic gait with narrow BOS, occasional scissoring of feet, and instability      Standardized Balance Assessment   Standardized Balance Assessment Berg Balance Test;Five Times Sit to Stand    Five times sit to stand comments  38.48 sec      Berg Balance Test   Sit to Stand Able to  stand  independently using hands    Standing Unsupported Able to stand safely 2 minutes    Sitting with Back Unsupported but Feet Supported on Floor or Stool Able to sit safely and securely 2 minutes    Stand to Sit Sits safely with minimal use of hands    Transfers Able to transfer safely, definite need of hands    Standing Unsupported with Eyes Closed Able to stand 3 seconds    Standing Unsupported with Feet Together Able to place feet together independently but unable to hold for 30 seconds    From Standing, Reach Forward with Outstretched Arm Can reach forward >12 cm safely (5")    From Standing Position, Pick up Object from Floor Able to pick up shoe, needs supervision    From Standing Position, Turn to Look Behind Over each Shoulder Looks behind one side only/other side shows less weight shift    Turn 360 Degrees Able to turn 360 degrees safely but slowly    Standing Unsupported, Alternately Place Feet on Step/Stool Able to complete >2 steps/needs minimal assist    Standing Unsupported, One Foot in Front Able to take small step independently and hold 30 seconds    Standing on One Leg Able to lift leg independently and hold equal to or more than 3 seconds    Total Score 38                      Objective measurements completed on examination: See above findings.               PT Education - 04/16/21 1301    Education Details Pt educated on POC and HEP    Person(s) Educated Patient    Methods Explanation;Demonstration;Handout    Comprehension Verbalized understanding;Returned demonstration            PT Short Term  Goals - 04/16/21 1310      PT SHORT TERM GOAL #1   Title Pt will be I with initial HEP    Time 2    Period Weeks    Target Date 04/30/21             PT Long Term Goals - 04/16/21 1310      PT LONG TERM GOAL #1   Title Pt will demo improved Berg Balance score to 52/56    Baseline 38/56    Time 8    Period Weeks    Status New    Target Date 06/11/21      PT LONG TERM GOAL #2   Title Pt will demo improved TUG to </= 20 seconds    Baseline 38 seconds    Time 8    Period Weeks    Status New    Target Date 06/11/21      PT LONG TERM GOAL #3   Title Pt will report 50% reduction in RLE pain    Time 8    Period Weeks    Status New    Target Date 06/11/21                  Plan - 04/16/21 1302    Clinical Impression Statement Pt presents to clinic with reports of balance deficits, recent fall, and LE weakness following recent hospitalization 5/5-5/6. Pt presented to ED with RLE weakness, slurred speech, and biting side of mouth; imaging was neg for acute infarct. Pt has hx of reflex sympathetic dystrophy (RSD) also known as chronic regional  pain syndrome (CRPS) since 2009. Demos extreme sensitivity to light touch to BLE R>L and very slow/guarded motions d/t increased in RLE pain. Unable to accurately assess LE strength d/t inability to withstand pressure/touch. Does demo ability to move BLE through ranges of motion. Pt is fall risk as demonstrated by 5x STS and Berg Balance scores. Pt reporting she is close to baseline for gait but has experienced a fall and gait instability recently. She would benefit from skilled PT to address fall risk, balance deficits,and LE strength/endurance. Pt would also benefit from pain science education.    Personal Factors and Comorbidities Comorbidity 2    Comorbidities anxiety/depression, RSD    Examination-Activity Limitations Stand;Locomotion Level    Examination-Participation Restrictions Interpersonal Relationship    Stability/Clinical  Decision Making Evolving/Moderate complexity    Clinical Decision Making Moderate    Rehab Potential Good    PT Frequency 1x / week    PT Duration 8 weeks    PT Treatment/Interventions ADLs/Self Care Home Management;Moist Heat;Neuromuscular re-education;Balance training;Therapeutic exercise;Therapeutic activities;Functional mobility training;Gait training;Patient/family education;Manual techniques;Passive range of motion    PT Next Visit Plan very gentle progression to TE, review/progress HEP, balance training, gait training, pain science education/desensitization training, hypersensitive to touch BLE R>L    PT Home Exercise Plan see pt instructions    Consulted and Agree with Plan of Care Patient           Patient will benefit from skilled therapeutic intervention in order to improve the following deficits and impairments:  Abnormal gait,Decreased range of motion,Difficulty walking,Decreased endurance,Increased muscle spasms,Decreased activity tolerance,Pain,Decreased balance,Decreased mobility  Visit Diagnosis: Muscle weakness (generalized)  Difficulty in walking, not elsewhere classified  Pain in right leg     Problem List Patient Active Problem List   Diagnosis Date Noted  . Slurred speech 03/15/2021  . Protruded cervical disc 06/22/2020  . Complex regional pain syndrome type 1 of lower extremity 06/22/2020  . Numbness 12/30/2018  . Cervical myelopathy ([place]) 08/05/2018  . Neck pain 02/19/2018  . History of fusion of cervical spine 02/19/2018  . Urinary hesitancy 02/19/2018  . Chronic non-specific white matter lesions on MRI 02/19/2018  . Accidental overdose 03/07/2016  . Atherosclerosis of native coronary artery of native heart without angina pectoris 02/10/2015  . History of DVT (deep vein thrombosis) 02/10/2015  . Tobacco abuse 02/10/2015  . Right leg pain 04/23/2013  . DVT (deep vein thrombosis) in pregnancy 04/23/2013  . Chronic pain disorder 04/23/2013  . RSD  lower limb 04/23/2013  . Chronic anticoagulation 04/23/2013   Amador Cunas, PT, DPT Donald Prose Evaleigh Mccamy 04/16/2021, 1:12 PM  [address] [place]. [place], [place], [address] Phone: [phone]   Fax:  [phone]  Name: Becky Wolfe MRN: [id number] Date of Birth: 09/05/1961

## 2021-04-16 NOTE — Patient Instructions (Signed)
Access Code: [email or web address] URL: [email or web address] Date: 04/16/2021 Prepared by: Amador Cunas  Exercises Sit to Stand Without Arm Support - 1 x daily - 7 x weekly - 3 sets - 5 reps Seated March - 1 x daily - 7 x weekly - 3 sets - 10 reps Seated Long Arc Quad - 1 x daily - 7 x weekly - 3 sets - 10 reps Seated Heel Raise - 1 x daily - 7 x weekly - 3 sets - 10 reps Seated Heel Toe Raises - 1 x daily - 7 x weekly - 3 sets - 10 reps

## 2021-04-17 ENCOUNTER — Ambulatory Visit (INDEPENDENT_AMBULATORY_CARE_PROVIDER_SITE_OTHER): Payer: Medicare Other | Admitting: Physician Assistant

## 2021-04-17 ENCOUNTER — Encounter: Payer: Self-pay | Admitting: Physician Assistant

## 2021-04-17 DIAGNOSIS — F411 Generalized anxiety disorder: Secondary | ICD-10-CM

## 2021-04-17 DIAGNOSIS — F3341 Major depressive disorder, recurrent, in partial remission: Secondary | ICD-10-CM | POA: Diagnosis not present

## 2021-04-17 DIAGNOSIS — F99 Mental disorder, not otherwise specified: Secondary | ICD-10-CM

## 2021-04-17 DIAGNOSIS — F5105 Insomnia due to other mental disorder: Secondary | ICD-10-CM | POA: Diagnosis not present

## 2021-04-17 MED ORDER — CLONAZEPAM 0.5 MG PO TABS: ORAL_TABLET | ORAL | 1 refills | Status: DC

## 2021-04-17 MED ORDER — SERTRALINE HCL 100 MG PO TABS: 200.0000 mg | ORAL_TABLET | Freq: Every day | ORAL | 0 refills | Status: DC

## 2021-04-17 NOTE — Progress Notes (Signed)
[facility] Med Check  Patient ID: VIRGINIA CURL,  MRN: [id number]  PCP: Mayra Neer, MD  Date of Evaluation: 04/17/2021 Time spent:30 minutes  Chief Complaint:  Chief Complaint    Anxiety; Depression; Insomnia; Follow-up      HISTORY/CURRENT STATUS: For routine med check.  Has been on the higher dose of Zoloft for several months now. Feels like it has helped get her to a place where she's able to help herself. Able to enjoy things, energy and motivation are better, not isolating, not crying easily.  Appetite is normal.  Weight is stable.  No suicidal or homicidal thoughts.  Still has a lot of anxiety, she can think about certain things and it will trigger an anxiety attack.  The Klonopin does help a lot and may be increasing the Zoloft was helpful to prevent the severity of the anxiety.  Patient denies increased energy with decreased need for sleep, no increased talkativeness, no racing thoughts, no impulsivity or risky behaviors, no increased spending, no increased libido, no grandiosity, no increased irritability or anger, no paranoia, and no hallucinations.  Denies dizziness, syncope, seizures, numbness, tingling, tremor, tics, unsteady gait, slurred speech, confusion. Denies muscle or joint pain, stiffness, or dystonia.  Individual Medical History/ Review of Systems: Changes? :Yes  She was having aphasia and went to the emergency room 03/15/2021.  Past medications for mental health diagnoses include: Klonopin, Zoloft, Paxil, Celexa, Ativan, Ambien caused sleep talking, Lamictal she preferred not to take  Allergies: Etodolac, Ambien [zolpidem tartrate], and Namenda [memantine]  Current Medications:  Current Outpatient Medications:  .  aspirin EC 81 MG tablet, , Disp: , Rfl:  .  cloNIDine (CATAPRES) 0.1 MG tablet, Take 0.1 mg by mouth 2 (two) times daily., Disp: , Rfl:  .  gabapentin (NEURONTIN) 800 MG tablet, Take 800 mg by mouth 4 (four) times daily., Disp: , Rfl: 1 .   naloxone (NARCAN) nasal spray 4 mg/0.1 mL, Place 0.4 mg into the nose once., Disp: , Rfl:  .  omeprazole (PRILOSEC) 40 MG capsule, Take 40 mg by mouth daily., Disp: , Rfl:  .  Tapentadol HCl (NUCYNTA) 100 MG TABS, Take 100 mg by mouth every 6 (six) hours., Disp: , Rfl:  .  clonazePAM (KLONOPIN) 0.5 MG tablet, TAKE 1 TABLET(0.5 MG) BY MOUTH TWICE DAILY AS NEEDED FOR ANXIETY, Disp: 60 tablet, Rfl: 1 .  cyclobenzaprine (FLEXERIL) 5 MG tablet, Take 1 tablet (5 mg total) by mouth every 8 (eight) hours as needed for muscle spasms. (Patient not taking: Reported on 04/17/2021), Disp: 90 tablet, Rfl: 1 .  sertraline (ZOLOFT) 100 MG tablet, Take 2 tablets (200 mg total) by mouth daily., Disp: 180 tablet, Rfl: 0 Medication Side Effects: none  Family Medical/ Social History: Changes? No  MENTAL HEALTH EXAM:  Last menstrual period 10/16/2011.There is no height or weight on file to calculate BMI.  General Appearance: Casual, Neat and Well Groomed  Eye Contact:  Good  Speech:  Clear and Coherent and Normal Rate  Volume:  Normal  Mood:  Euthymic  Affect:  Appropriate  Thought Process:  Goal Directed and Descriptions of Associations: Intact  Orientation:  Full (Time, Place, and Person)  Thought Content: Logical   Suicidal Thoughts:  No  Homicidal Thoughts:  No  Memory:  WNL  Judgement:  Fair  Insight:  Fair  Psychomotor Activity:  Normal  Concentration:  Concentration: Good and Attention Span: Good  Recall:  Good  Fund of Knowledge: Good  Language: Good  Assets:  Desire for Improvement  ADL's:  Intact  Cognition: WNL  Prognosis:  Good   Reviewed ER notes, labs, and imaging results from 03/15/2021.   DIAGNOSES:    ICD-10-CM   1. Recurrent major depressive disorder, in partial remission ([place])  F33.41   2. Generalized anxiety disorder  F41.1   3. Insomnia due to other mental disorder  F51.05    F99     Receiving Psychotherapy: No    RECOMMENDATIONS:  PDMP was reviewed. I provided 30  minutes of face-to-face time during this encounter, including time spent before and after the visit and records review, medical decision making, and charting. I am glad to see her doing much better!  No changes in medications are necessary. Continue Klonopin 0.5 mg, 1/2-1 p.o. twice daily as needed. Continue gabapentin 800 mg, 1 p.o. 3 times daily. Continue clonidine 0.1 mg, 1 p.o. twice daily. Return in 3 months.  Donnal Moat, PA-C

## 2021-04-18 DIAGNOSIS — H01111 Allergic dermatitis of right upper eyelid: Secondary | ICD-10-CM | POA: Diagnosis not present

## 2021-04-18 DIAGNOSIS — H01114 Allergic dermatitis of left upper eyelid: Secondary | ICD-10-CM | POA: Diagnosis not present

## 2021-04-18 DIAGNOSIS — H2513 Age-related nuclear cataract, bilateral: Secondary | ICD-10-CM | POA: Diagnosis not present

## 2021-04-18 DIAGNOSIS — H04123 Dry eye syndrome of bilateral lacrimal glands: Secondary | ICD-10-CM | POA: Diagnosis not present

## 2021-04-19 DIAGNOSIS — M961 Postlaminectomy syndrome, not elsewhere classified: Secondary | ICD-10-CM | POA: Diagnosis not present

## 2021-04-19 DIAGNOSIS — M5412 Radiculopathy, cervical region: Secondary | ICD-10-CM | POA: Diagnosis not present

## 2021-04-19 DIAGNOSIS — G90522 Complex regional pain syndrome I of left lower limb: Secondary | ICD-10-CM | POA: Diagnosis not present

## 2021-04-19 DIAGNOSIS — C419 Malignant neoplasm of bone and articular cartilage, unspecified: Secondary | ICD-10-CM | POA: Diagnosis not present

## 2021-04-19 DIAGNOSIS — R1031 Right lower quadrant pain: Secondary | ICD-10-CM | POA: Diagnosis not present

## 2021-04-19 DIAGNOSIS — M5416 Radiculopathy, lumbar region: Secondary | ICD-10-CM | POA: Diagnosis not present

## 2021-04-19 DIAGNOSIS — R52 Pain, unspecified: Secondary | ICD-10-CM | POA: Diagnosis not present

## 2021-04-19 DIAGNOSIS — D869 Sarcoidosis, unspecified: Secondary | ICD-10-CM | POA: Diagnosis not present

## 2021-04-19 DIAGNOSIS — G90529 Complex regional pain syndrome I of unspecified lower limb: Secondary | ICD-10-CM | POA: Diagnosis not present

## 2021-04-19 DIAGNOSIS — M79661 Pain in right lower leg: Secondary | ICD-10-CM | POA: Diagnosis not present

## 2021-04-19 DIAGNOSIS — M549 Dorsalgia, unspecified: Secondary | ICD-10-CM | POA: Diagnosis not present

## 2021-04-19 DIAGNOSIS — M79609 Pain in unspecified limb: Secondary | ICD-10-CM | POA: Diagnosis not present

## 2021-04-24 ENCOUNTER — Ambulatory Visit: Payer: Medicare Other | Admitting: Rehabilitative and Restorative Service Providers"

## 2021-04-24 ENCOUNTER — Other Ambulatory Visit: Payer: Self-pay

## 2021-04-24 ENCOUNTER — Encounter: Payer: Self-pay | Admitting: Rehabilitative and Restorative Service Providers"

## 2021-04-24 DIAGNOSIS — G35 Multiple sclerosis: Secondary | ICD-10-CM | POA: Insufficient documentation

## 2021-04-24 DIAGNOSIS — M79604 Pain in right leg: Secondary | ICD-10-CM | POA: Diagnosis not present

## 2021-04-24 DIAGNOSIS — M6281 Muscle weakness (generalized): Secondary | ICD-10-CM

## 2021-04-24 DIAGNOSIS — K7469 Other cirrhosis of liver: Secondary | ICD-10-CM | POA: Insufficient documentation

## 2021-04-24 DIAGNOSIS — I82502 Chronic embolism and thrombosis of unspecified deep veins of left lower extremity: Secondary | ICD-10-CM | POA: Insufficient documentation

## 2021-04-24 DIAGNOSIS — G35A Relapsing-remitting multiple sclerosis: Secondary | ICD-10-CM | POA: Insufficient documentation

## 2021-04-24 DIAGNOSIS — R262 Difficulty in walking, not elsewhere classified: Secondary | ICD-10-CM

## 2021-04-24 DIAGNOSIS — F1120 Opioid dependence, uncomplicated: Secondary | ICD-10-CM | POA: Insufficient documentation

## 2021-04-24 DIAGNOSIS — I10 Essential (primary) hypertension: Secondary | ICD-10-CM | POA: Insufficient documentation

## 2021-04-24 NOTE — Therapy (Signed)
[place]. [place], [place], [address] Phone: [phone]   Fax:  [phone]  Physical Therapy Treatment  Patient Details  Name: Becky Wolfe MRN: [id number] Date of Birth: [birth year] Referring Provider (PT): Janey Genta Date: 04/24/2021   PT End of Session - 04/24/21 0951     Visit Number 2    Date for PT Re-Evaluation 06/16/21    PT Start Time 0946    PT Stop Time 1015    PT Time Calculation (min) 29 min    Activity Tolerance Patient limited by pain    Behavior During Therapy Anxious             Past Medical History:  Diagnosis Date   Anxiety 06-16-13   hx. panic attacks, none recent   Blood transfusion without reported diagnosis    20 yrs ago after childbirth   Bronchitis    hx of   Chronic neck pain    Constipation    Depression    DVT (deep venous thrombosis) ([place]) 2015   rt. leg    Headache(784.0)    migraines-has decreased   Heart murmur    Dr. Etter Sjogren   Hepatitis C    dx. Hep. C(s/p transfusion age 17) low Hgb  .-multiple transfusions. Treated successfully   Multiple sclerosis ([place])    dx. -20yr ago, then med stopped-due to liver enzymes changes,occ. periods of weakness in arms"d.  [facility]"   Pneumonia    hx of , ? mild emphysema on CT scan 5'14, multiple pulmonary nodules   RSD (reflex sympathetic dystrophy) 06-16-13   legs   RSD (reflex sympathetic dystrophy) 2009   TIA (transient ischemic attack)    hx of (weakness left side)-none in 1 yr    Past Surgical History:  Procedure Laterality Date   ABDOMINAL HYSTERECTOMY     ANAL RECTAL MANOMETRY N/A 04/11/2015   Procedure: ANO RECTAL MANOMETRY;  Surgeon: WArta Silence MD;  [facility];  Service: Endoscopy;  Laterality: N/A;   ANAL RECTAL MANOMETRY N/A 05/21/2017   Procedure: ANO RECTAL MANOMETRY;  Surgeon: OArta Silence MD;  [facility];   Service: Endoscopy;  Laterality: N/A;   ANKLE SURGERY Right    no retained hardware   ANTERIOR CERVICAL DECOMP/DISCECTOMY FUSION  09/21/2012   Procedure: ANTERIOR CERVICAL DECOMPRESSION/DISCECTOMY FUSION 2 LEVELS;  Surgeon: JOtilio Connors MD;  [facility];  Service: Neurosurgery;  Laterality: N/A;  Cervical five-six, cervical seven-thoracic one Anterior cervical decompression/diskectomy/fusion/Lifenet bone/trestle plate/Prior Cervical six--seven Anterior cervical fusion/Removing trestle plate   ANTERIOR CERVICAL DECOMP/DISCECTOMY FUSION N/A 08/05/2018   Procedure: Cervical four-five Anterior cervical decompression/discectomy/fusion;  Surgeon: OJudith Part MD;  [facility]  Service: Neurosurgery;  Laterality: N/A;   APPENDECTOMY     CESAREAN SECTION     twice   COLONOSCOPY     ESOPHAGOGASTRODUODENOSCOPY (EGD) WITH PROPOFOL N/A 06/30/2013   Procedure: ESOPHAGOGASTRODUODENOSCOPY (EGD) WITH PROPOFOL;  Surgeon: WArta Silence MD;  [facility];  Service: Endoscopy;  Laterality: N/A;   NECK SURGERY     WEDGE RESECTION     left ovary with cyst removed ,hx. multiple cysts removed prior    There were no vitals filed for this visit.   Subjective Assessment - 04/24/21 0950     Subjective Pt reports that she is doing okay.  Denies any new falls.    Pertinent History RSD (  2009), potential MS (unclear diagnosis), DVT in RLE (2015), anxiety/depression    Patient Stated Goals improve balance, decrease pain    Currently in Pain? Yes    Pain Score 7     Pain Location Leg    Pain Orientation Right    Pain Descriptors / Indicators Burning;Tingling;Shooting                               [facility] Adult PT Treatment/Exercise - 04/24/21 0001       High Level Balance   High Level Balance Activities Tandem walking    High Level Balance Comments Standing on AirEx with eyes closed.      Exercises   Exercises Knee/Hip      Knee/Hip Exercises: Aerobic    Nustep L3 x6 min      Knee/Hip Exercises: Standing   Forward Step Up Both;1 set;10 reps;Hand Hold: 2;Step Height: 4"    Forward Step Up Limitations with alt LE extension      Knee/Hip Exercises: Seated   Long Arc Quad Strengthening;Both;2 sets;10 reps    Long Arc Quad Weight 2 lbs.    Marching Strengthening;Both;2 sets;10 reps    Marching Limitations 2                      PT Short Term Goals - 04/24/21 1030       PT SHORT TERM GOAL #1   Title Pt will be I with initial HEP    Status Partially Met               PT Long Term Goals - 04/24/21 1031       PT LONG TERM GOAL #1   Title Pt will demo improved Berg Balance score to 52/56    Status On-going      PT LONG TERM GOAL #2   Title Pt will demo improved TUG to </= 20 seconds    Status On-going      PT LONG TERM GOAL #3   Title Pt will report 50% reduction in RLE pain    Status On-going                   Plan - 04/24/21 1019     Clinical Impression Statement Pt arrived 16 min late for treatment, but wanted to proceed despite shorter visit.  Patient requires increased time throughout with increased pain.  Pt was educated about desensitization techniques secondary to her RSD and to start with soft objects, like a cotton ball.  She had some instability noted with tandem and static stance on airex with eyes closed and required use of SBA with parallel bars.  She continues to require skilled PT.    Personal Factors and Comorbidities Comorbidity 2    Comorbidities anxiety/depression, RSD    PT Treatment/Interventions ADLs/Self Care Home Management;Moist Heat;Neuromuscular re-education;Balance training;Therapeutic exercise;Therapeutic activities;Functional mobility training;Gait training;Patient/family education;Manual techniques;Passive range of motion    PT Next Visit Plan very gentle progression to TE, review/progress HEP, balance training, gait training, pain science education/desensitization  training, hypersensitive to touch BLE R>L    Consulted and Agree with Plan of Care Patient             Patient will benefit from skilled therapeutic intervention in order to improve the following deficits and impairments:  Abnormal gait, Decreased range of motion, Difficulty walking, Decreased endurance, Increased muscle spasms, Decreased activity tolerance, Pain, Decreased balance,  Decreased mobility  Visit Diagnosis: Muscle weakness (generalized)  Difficulty in walking, not elsewhere classified  Pain in right leg     Problem List Patient Active Problem List   Diagnosis Date Noted   Slurred speech 03/15/2021   Protruded cervical disc 06/22/2020   Complex regional pain syndrome type 1 of lower extremity 06/22/2020   Numbness 12/30/2018   Cervical myelopathy ([facility]) 08/05/2018   Neck pain 02/19/2018   History of fusion of cervical spine 02/19/2018   Urinary hesitancy 02/19/2018   Chronic non-specific white matter lesions on MRI 02/19/2018   Accidental overdose 03/07/2016   Atherosclerosis of native coronary artery of native heart without angina pectoris 02/10/2015   History of DVT (deep vein thrombosis) 02/10/2015   Tobacco abuse 02/10/2015   Right leg pain 04/23/2013   DVT (deep vein thrombosis) in pregnancy 04/23/2013   Chronic pain disorder 04/23/2013   RSD lower limb 04/23/2013   Chronic anticoagulation 04/23/2013    Juel Burrow, PT, DPT 04/24/2021, 10:32 AM  [place]. [place], [place], [address] Phone: [phone]   Fax:  [phone]  Name: EVETTA RENNER MRN: [id number] Date of Birth: April 28, 1961

## 2021-04-24 NOTE — Patient Instructions (Signed)
Desensitization for RSD: To prepare your leg for wearing clothes, it's important to desensitize the skin to make your limb less sensitive. This will help make wearing clothes more comfortable. It can also decrease sensation and pain.  One way to desensitize your leg is by rubbing it with different textures. This will make your limb more tolerant to touch and pressure. Before you begin, make sure your hands and the materials you're using are clean.  To rub your leg with different textures:  Sit in a comfortable position with your leg is uncovered. Start with a material that is soft, such as a cotton ball or soft towel. Rub your leg in all directions. Start with a light pressure and gradually increase the pressure. Vary the textures you use as you are able to tolerate them. Start with soft materials like cotton balls or a makeup brush. Progress to materials that are rougher. Examples include a paper towel, cloth towel, or hair brush. As you progress, gradually increase the pressure and roughness of the texture you use. Be careful not to rub over your incision if you still have staples or sutures in place, or if there are any open areas. Rub your limb for at least 30 seconds as often as you can tolerate, or as recommended by your healthcare provider.

## 2021-04-25 DIAGNOSIS — K7469 Other cirrhosis of liver: Secondary | ICD-10-CM | POA: Diagnosis not present

## 2021-04-26 ENCOUNTER — Other Ambulatory Visit: Payer: Self-pay | Admitting: Nurse Practitioner

## 2021-04-26 DIAGNOSIS — K7469 Other cirrhosis of liver: Secondary | ICD-10-CM

## 2021-05-01 ENCOUNTER — Ambulatory Visit: Payer: Medicare Other | Admitting: Rehabilitative and Restorative Service Providers"

## 2021-05-03 ENCOUNTER — Other Ambulatory Visit: Payer: Self-pay

## 2021-05-03 ENCOUNTER — Ambulatory Visit: Payer: Medicare Other | Admitting: Physical Therapy

## 2021-05-03 ENCOUNTER — Encounter: Payer: Self-pay | Admitting: Physical Therapy

## 2021-05-03 DIAGNOSIS — M6281 Muscle weakness (generalized): Secondary | ICD-10-CM | POA: Diagnosis not present

## 2021-05-03 DIAGNOSIS — R262 Difficulty in walking, not elsewhere classified: Secondary | ICD-10-CM | POA: Diagnosis not present

## 2021-05-03 DIAGNOSIS — M79604 Pain in right leg: Secondary | ICD-10-CM | POA: Diagnosis not present

## 2021-05-03 NOTE — Therapy (Signed)
[place] [place], [place], [address] Phone: [phone]   Fax:  [phone]  Physical Therapy Treatment  Patient Details  Name: Becky Wolfe MRN: [id number] Date of Birth: 12-Apr-1961 Referring Provider (PT): Janey Genta Date: 05/03/2021   PT End of Session - 05/03/21 1107     Visit Number 3    Date for PT Re-Evaluation 06/16/21    PT Start Time 7902    PT Stop Time 1044    PT Time Calculation (min) 42 min    Activity Tolerance Patient limited by pain    Behavior During Therapy Anxious             Past Medical History:  Diagnosis Date   Anxiety 06-16-13   hx. panic attacks, none recent   Blood transfusion without reported diagnosis    20 yrs ago after childbirth   Bronchitis    hx of   Chronic neck pain    Constipation    Depression    DVT (deep venous thrombosis) ([place]) 2015   rt. leg    Headache(784.0)    migraines-has decreased   Heart murmur    Dr. Etter Sjogren   Hepatitis C    dx. Hep. C(s/p transfusion age 37) low Hgb  .-multiple transfusions. Treated successfully   Multiple sclerosis ([place])    dx. -75yrs ago, then med stopped-due to liver enzymes changes,occ. periods of weakness in arms"d.  was seen at [facility] and tolded that she does not have MSrops things"   Pneumonia    hx of , ? mild emphysema on CT scan 5'14, multiple pulmonary nodules   RSD (reflex sympathetic dystrophy) 06-16-13   legs   RSD (reflex sympathetic dystrophy) 2009   TIA (transient ischemic attack)    hx of (weakness left side)-none in 1 yr    Past Surgical History:  Procedure Laterality Date   ABDOMINAL HYSTERECTOMY     ANAL RECTAL MANOMETRY N/A 04/11/2015   Procedure: ANO RECTAL MANOMETRY;  Surgeon: Arta Silence, MD;  Location: [facility];  Service: Endoscopy;  Laterality: N/A;   ANAL RECTAL MANOMETRY N/A 05/21/2017   Procedure: ANO RECTAL MANOMETRY;  Surgeon: Arta Silence, MD;  Location: [facility];   Service: Endoscopy;  Laterality: N/A;   ANKLE SURGERY Right    no retained hardware   ANTERIOR CERVICAL DECOMP/DISCECTOMY FUSION  09/21/2012   Procedure: ANTERIOR CERVICAL DECOMPRESSION/DISCECTOMY FUSION 2 LEVELS;  Surgeon: Otilio Connors, MD;  Location: [facility];  Service: Neurosurgery;  Laterality: N/A;  Cervical five-six, cervical seven-thoracic one Anterior cervical decompression/diskectomy/fusion/Lifenet bone/trestle plate/Prior Cervical six--seven Anterior cervical fusion/Removing trestle plate   ANTERIOR CERVICAL DECOMP/DISCECTOMY FUSION N/A 08/05/2018   Procedure: Cervical four-five Anterior cervical decompression/discectomy/fusion;  Surgeon: Judith Part, MD;  Location: [facility];  Service: Neurosurgery;  Laterality: N/A;   APPENDECTOMY     CESAREAN SECTION     twice   COLONOSCOPY     ESOPHAGOGASTRODUODENOSCOPY (EGD) WITH PROPOFOL N/A 06/30/2013   Procedure: ESOPHAGOGASTRODUODENOSCOPY (EGD) WITH PROPOFOL;  Surgeon: Arta Silence, MD;  Location: [facility];  Service: Endoscopy;  Laterality: N/A;   NECK SURGERY     WEDGE RESECTION     left ovary with cyst removed ,hx. multiple cysts removed prior    There were no vitals filed for this visit.   Subjective Assessment - 05/03/21 1008     Subjective Pt states pain in RLE increased to 7/10 this morning. Fell into [employer] after tripping over dog a  few days ago; has some bruising on L arm, denies L shoulder pain and denies hitting head/other new injury.    Currently in Pain? Yes    Pain Score 7     Pain Location Leg    Pain Orientation Right                               [facility] Adult PT Treatment/Exercise - 05/03/21 0001       Knee/Hip Exercises: Stretches   Gastroc Stretch Both;1 rep;20 seconds      Knee/Hip Exercises: Aerobic   Nustep L5 x 6 min    Other Aerobic UBE L1.5 x2 min each      Knee/Hip Exercises: Standing   Heel Raises Both;1 set;10 reps    Knee Flexion Both;1 set;10 reps    Knee  Flexion Limitations 2.5#    Hip Abduction Both;1 set;10 reps    Abduction Limitations 2.5#    Hip Extension Both;1 set;10 reps    Extension Limitations 2.5#      Knee/Hip Exercises: Seated   Long Arc Quad Strengthening;Both;2 sets;10 reps    Long Arc Quad Limitations 2.5#    Marching Strengthening;Both;2 sets;10 reps    Marching Limitations 2.5#    Sit to [employer] 1 set;5 reps;without UE support   with verbal and tactile cuing for sequencing                     PT Short Term Goals - 05/03/21 1111       PT SHORT TERM GOAL #1   Title Pt will be I with initial HEP    Status Achieved               PT Long Term Goals - 04/24/21 1031       PT LONG TERM GOAL #1   Title Pt will demo improved Berg Balance score to 52/56    Status On-going      PT LONG TERM GOAL #2   Title Pt will demo improved TUG to </= 20 seconds    Status On-going      PT LONG TERM GOAL #3   Title Pt will report 50% reduction in RLE pain    Status On-going                   Plan - 05/03/21 1108     Clinical Impression Statement Pt required frequent rest breaks throughout session d/t increased pain/fatigue this session. Reinforcement of education regarding desensitization techniques secondary to RSD/CRPS; patient will need ongoing pain education and reinforcement of education. Able to tolerate ankle weights on RLE but unable to tolerate banded ex's. Cues to avoid compensations with standing hip ex's. Pt tends to push with UEs to stand with posterior lean; spent extended time educating on sequencing with STS with verbal/tactile cues needed for foot placement along with anterior trunk movement. Reinforce education next rx. Would benefit from continued skilled PT.    PT Treatment/Interventions ADLs/Self Care Home Management;Moist Heat;Neuromuscular re-education;Balance training;Therapeutic exercise;Therapeutic activities;Functional mobility training;Gait training;Patient/family education;Manual  techniques;Passive range of motion    PT Next Visit Plan very gentle progression to TE, review/progress HEP, balance training, gait training, pain science education/desensitization training, hypersensitive to touch BLE R>L    Consulted and Agree with Plan of Care Patient             Patient will benefit from skilled therapeutic intervention in order to improve  the following deficits and impairments:  Abnormal gait, Decreased range of motion, Difficulty walking, Decreased endurance, Increased muscle spasms, Decreased activity tolerance, Pain, Decreased balance, Decreased mobility  Visit Diagnosis: Muscle weakness (generalized)  Difficulty in walking, not elsewhere classified  Pain in right leg     Problem List Patient Active Problem List   Diagnosis Date Noted   Slurred speech 03/15/2021   Protruded cervical disc 06/22/2020   Complex regional pain syndrome type 1 of lower extremity 06/22/2020   Numbness 12/30/2018   Cervical myelopathy ([facility]) 08/05/2018   Neck pain 02/19/2018   History of fusion of cervical spine 02/19/2018   Urinary hesitancy 02/19/2018   Chronic non-specific white matter lesions on MRI 02/19/2018   Accidental overdose 03/07/2016   Atherosclerosis of native coronary artery of native heart without angina pectoris 02/10/2015   History of DVT (deep vein thrombosis) 02/10/2015   Tobacco abuse 02/10/2015   Right leg pain 04/23/2013   DVT (deep vein thrombosis) in pregnancy 04/23/2013   Chronic pain disorder 04/23/2013   RSD lower limb 04/23/2013   Chronic anticoagulation 04/23/2013   Amador Cunas, PT, DPT Donald Prose Shaune Malacara 05/03/2021, 11:12 AM  [address]. [place], [place], [address] Phone: [phone]   Fax:  [phone]  Name: JOHNA KEARL MRN: [id number] Date of Birth: Feb 11, 1961

## 2021-05-08 ENCOUNTER — Ambulatory Visit: Payer: Medicare Other | Admitting: Physical Therapy

## 2021-05-15 ENCOUNTER — Ambulatory Visit: Payer: Medicare Other | Attending: Internal Medicine | Admitting: Physical Therapy

## 2021-05-16 ENCOUNTER — Telehealth: Payer: Self-pay | Admitting: Physician Assistant

## 2021-05-16 NOTE — Telephone Encounter (Signed)
Patient Becky Wolfe crying profusely. Family members were in a horrific accident,and the outcome is very bleak. Patient is seeking help to deal with this ordeal and be strong for the family.# [phone] cell #.

## 2021-05-16 NOTE — Telephone Encounter (Signed)
RTC to pt and someone who had her phone informed me he will have her give [place] a call back.

## 2021-05-17 ENCOUNTER — Other Ambulatory Visit: Payer: Self-pay | Admitting: Psychiatry

## 2021-05-17 NOTE — Telephone Encounter (Signed)
Sorry for what is happening to her family.  There is nothing unusual about her reaction to such a tragic event.  Medication will not fix this.  It would take time to work through the shock and grief and adjust to the circumstance.  However will agree that she can increase the clonazepam to 3 daily of the 0.5 mg tablets.  But do not expect this to take the pain away.

## 2021-05-18 ENCOUNTER — Other Ambulatory Visit: Payer: Self-pay

## 2021-05-18 ENCOUNTER — Other Ambulatory Visit: Payer: Self-pay | Admitting: Psychiatry

## 2021-05-18 MED ORDER — TRAZODONE HCL 50 MG PO TABS: ORAL_TABLET | ORAL | 0 refills | Status: DC

## 2021-05-18 NOTE — Telephone Encounter (Signed)
Rtc to patient and discussed medication schedule and adding on Trazodone as needed for sleep. She verbalized understanding of instructions.

## 2021-05-18 NOTE — Telephone Encounter (Signed)
As mentioned yesterday she can take up to 3 of the clonazepam daily.  She could take 1 in the morning 1 in the afternoon and 1 at night or she could take 1 in the morning and 2 at night which may help her sleep.  However if it does not also send in a prescription for trazodone 50 mg tablets 1 or 2 at night as needed for sleep to her pharmacy.  That can be combined with the clonazepam if needed.  Take the lowest amount of these medications that is adequate to help her sleep.  Because she is new to these medicines be careful if she gets up in the middle of the night until she is sure she is not over sleepy if she has to go to the bathroom or has to get up for other reasons at night.

## 2021-05-18 NOTE — Telephone Encounter (Signed)
Tried to reach pt but it kept ringing, no vm. Will try back again.

## 2021-05-18 NOTE — Telephone Encounter (Signed)
Pt called back and discussed that she is not sleeping at all. She takes 1 Klonopin 0.5 mg in am and 1 at hs. Advised her to take 1 tablet midday and I would discuss something different to help her sleep. She has not been on any sleep aid before.  She was appreciative of my call and thoughts sent to her from the office.   Informed her I would follow up and call back

## 2021-05-21 NOTE — Telephone Encounter (Signed)
Please

## 2021-05-21 NOTE — Telephone Encounter (Signed)
I'm sorry to hear this sad news. How is she doing?

## 2021-05-29 ENCOUNTER — Ambulatory Visit: Payer: Medicare Other | Admitting: Physical Therapy

## 2021-06-21 ENCOUNTER — Ambulatory Visit
Admission: RE | Admit: 2021-06-21 | Discharge: 2021-06-21 | Disposition: A | Discharge status: Home / Self Care | Payer: Medicare Other | Source: Ambulatory Visit | Attending: Nurse Practitioner | Admitting: Nurse Practitioner

## 2021-06-21 ENCOUNTER — Other Ambulatory Visit: Payer: Self-pay

## 2021-06-21 DIAGNOSIS — K7469 Other cirrhosis of liver: Secondary | ICD-10-CM

## 2021-06-21 DIAGNOSIS — Z0389 Encounter for observation for other suspected diseases and conditions ruled out: Secondary | ICD-10-CM | POA: Diagnosis not present

## 2021-07-11 ENCOUNTER — Ambulatory Visit: Payer: Medicare Other | Admitting: Neurology

## 2021-07-11 ENCOUNTER — Encounter: Payer: Self-pay | Admitting: Neurology

## 2021-07-11 VITALS — BP 146/81 | HR 76 | Ht 62.0 in | Wt 142.0 lb

## 2021-07-11 DIAGNOSIS — R200 Anesthesia of skin: Secondary | ICD-10-CM

## 2021-07-11 DIAGNOSIS — G459 Transient cerebral ischemic attack, unspecified: Secondary | ICD-10-CM | POA: Diagnosis not present

## 2021-07-11 DIAGNOSIS — G894 Chronic pain syndrome: Secondary | ICD-10-CM

## 2021-07-11 DIAGNOSIS — G90529 Complex regional pain syndrome I of unspecified lower limb: Secondary | ICD-10-CM

## 2021-07-11 MED ORDER — CLOPIDOGREL BISULFATE 75 MG PO TABS: 75.0000 mg | ORAL_TABLET | Freq: Every day | ORAL | 3 refills | Status: DC
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
## 2021-07-17 ENCOUNTER — Other Ambulatory Visit: Payer: Self-pay

## 2021-07-17 ENCOUNTER — Encounter: Payer: Self-pay | Admitting: Physician Assistant

## 2021-07-17 ENCOUNTER — Ambulatory Visit: Payer: Medicare Other | Admitting: Physician Assistant

## 2021-07-17 DIAGNOSIS — F410 Panic disorder [episodic paroxysmal anxiety] without agoraphobia: Secondary | ICD-10-CM | POA: Diagnosis not present

## 2021-07-17 DIAGNOSIS — F3341 Major depressive disorder, recurrent, in partial remission: Secondary | ICD-10-CM

## 2021-07-17 DIAGNOSIS — F411 Generalized anxiety disorder: Secondary | ICD-10-CM

## 2021-07-17 DIAGNOSIS — F5105 Insomnia due to other mental disorder: Secondary | ICD-10-CM | POA: Diagnosis not present

## 2021-07-17 DIAGNOSIS — Z6379 Other stressful life events affecting family and household: Secondary | ICD-10-CM

## 2021-07-17 DIAGNOSIS — F99 Mental disorder, not otherwise specified: Secondary | ICD-10-CM

## 2021-07-17 MED ORDER — CLONAZEPAM 0.5 MG PO TABS: 0.5000 mg | ORAL_TABLET | Freq: Three times a day (TID) | ORAL | 1 refills | Status: DC | PRN

## 2021-07-17 MED ORDER — SERTRALINE HCL 100 MG PO TABS: 200.0000 mg | ORAL_TABLET | Freq: Every day | ORAL | 0 refills | Status: DC
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
## 2021-07-19 DIAGNOSIS — Z1231 Encounter for screening mammogram for malignant neoplasm of breast: Secondary | ICD-10-CM | POA: Diagnosis not present

## 2021-07-20 DIAGNOSIS — Z720 Tobacco use: Secondary | ICD-10-CM | POA: Diagnosis not present

## 2021-07-20 DIAGNOSIS — Z8673 Personal history of transient ischemic attack (TIA), and cerebral infarction without residual deficits: Secondary | ICD-10-CM | POA: Diagnosis not present

## 2021-07-20 DIAGNOSIS — I10 Essential (primary) hypertension: Secondary | ICD-10-CM | POA: Diagnosis not present

## 2021-07-20 DIAGNOSIS — Z23 Encounter for immunization: Secondary | ICD-10-CM | POA: Diagnosis not present

## 2021-07-20 DIAGNOSIS — G9050 Complex regional pain syndrome I, unspecified: Secondary | ICD-10-CM | POA: Diagnosis not present

## 2021-07-20 DIAGNOSIS — K7460 Unspecified cirrhosis of liver: Secondary | ICD-10-CM | POA: Diagnosis not present

## 2021-07-25 DIAGNOSIS — M79604 Pain in right leg: Secondary | ICD-10-CM | POA: Diagnosis not present

## 2021-07-25 DIAGNOSIS — G90529 Complex regional pain syndrome I of unspecified lower limb: Secondary | ICD-10-CM | POA: Diagnosis not present

## 2021-07-25 DIAGNOSIS — M79609 Pain in unspecified limb: Secondary | ICD-10-CM | POA: Diagnosis not present

## 2021-07-25 DIAGNOSIS — M5020 Other cervical disc displacement, unspecified cervical region: Secondary | ICD-10-CM | POA: Diagnosis not present

## 2021-07-25 DIAGNOSIS — G90523 Complex regional pain syndrome I of lower limb, bilateral: Secondary | ICD-10-CM | POA: Diagnosis not present

## 2021-07-25 DIAGNOSIS — R52 Pain, unspecified: Secondary | ICD-10-CM | POA: Diagnosis not present

## 2021-07-25 DIAGNOSIS — Z79899 Other long term (current) drug therapy: Secondary | ICD-10-CM | POA: Diagnosis not present

## 2021-07-25 DIAGNOSIS — M5412 Radiculopathy, cervical region: Secondary | ICD-10-CM | POA: Diagnosis not present

## 2021-07-25 DIAGNOSIS — G894 Chronic pain syndrome: Secondary | ICD-10-CM | POA: Diagnosis not present

## 2021-07-25 DIAGNOSIS — C419 Malignant neoplasm of bone and articular cartilage, unspecified: Secondary | ICD-10-CM | POA: Diagnosis not present

## 2021-07-25 DIAGNOSIS — G90522 Complex regional pain syndrome I of left lower limb: Secondary | ICD-10-CM | POA: Diagnosis not present

## 2021-07-25 DIAGNOSIS — Z5181 Encounter for therapeutic drug level monitoring: Secondary | ICD-10-CM | POA: Diagnosis not present

## 2021-07-25 DIAGNOSIS — M79661 Pain in right lower leg: Secondary | ICD-10-CM | POA: Diagnosis not present

## 2021-08-24 ENCOUNTER — Ambulatory Visit: Payer: Medicare Other | Admitting: Internal Medicine

## 2021-08-29 ENCOUNTER — Encounter: Payer: Self-pay | Admitting: Cardiovascular Disease

## 2021-08-29 ENCOUNTER — Ambulatory Visit (INDEPENDENT_AMBULATORY_CARE_PROVIDER_SITE_OTHER): Payer: Medicare Other

## 2021-08-29 ENCOUNTER — Other Ambulatory Visit: Payer: Self-pay

## 2021-08-29 ENCOUNTER — Ambulatory Visit: Payer: Medicare Other | Admitting: Cardiovascular Disease

## 2021-08-29 VITALS — BP 104/58 | HR 54 | Ht 62.0 in | Wt 139.6 lb

## 2021-08-29 DIAGNOSIS — R002 Palpitations: Secondary | ICD-10-CM | POA: Diagnosis not present

## 2021-08-29 DIAGNOSIS — Z8249 Family history of ischemic heart disease and other diseases of the circulatory system: Secondary | ICD-10-CM

## 2021-08-29 DIAGNOSIS — Z8673 Personal history of transient ischemic attack (TIA), and cerebral infarction without residual deficits: Secondary | ICD-10-CM | POA: Diagnosis not present

## 2021-08-29 DIAGNOSIS — R55 Syncope and collapse: Secondary | ICD-10-CM | POA: Diagnosis not present

## 2021-08-29 NOTE — Progress Notes (Unsigned)
Enrolled patient for a 14 day Zio XT  monitor to be mailed to patients home  °

## 2021-08-29 NOTE — Patient Instructions (Signed)
Medication Instructions:  No changes *If you need a refill on your cardiac medications before your next appointment, please call your pharmacy*   Lab Work: None ordered If you have labs (blood work) drawn today and your tests are completely normal, you will receive your results only by: [place] (if you have MyChart) OR A paper copy in the mail If you have any lab test that is abnormal or we need to change your treatment, we will call you to review the results.   Testing/Procedures: [facility]- Long Term Monitor Instructions  Your physician has requested you wear a ZIO patch monitor for 14 days.  This is a single patch monitor. [employer] supplies one patch monitor per enrollment. Additional stickers are not available. Please do not apply patch if you will be having a Nuclear Stress Test,  Echocardiogram, Cardiac CT, MRI, or Chest Xray during the period you would be wearing the  monitor. The patch cannot be worn during these tests. You cannot remove and re-apply the  ZIO XT patch monitor.  Your ZIO patch monitor will be mailed 3 day USPS to your address on file. It may take 3-5 days  to receive your monitor after you have been enrolled.  Once you have received your monitor, please review the enclosed instructions. Your monitor  has already been registered assigning a specific monitor serial # to you.  Billing and Patient Assistance Program Information  We have supplied [employer] with any of your insurance information on file for billing purposes. [employer] offers a sliding scale Patient Assistance Program for patients that do not have  insurance, or whose insurance does not completely cover the cost of the ZIO monitor.  You must apply for the Patient Assistance Program to qualify for this discounted rate.  To apply, please call [employer] at [phone], select option 4, select option 2, ask to apply for  Patient Assistance Program. Theodore Demark will ask your household income, and how many  people  are in your household. They will quote your out-of-pocket cost based on that information.  [employer] will also be able to set up a 79-month interest-free payment plan if needed.  Applying the monitor   Shave hair from upper left chest.  Hold abrader disc by orange tab. Rub abrader in 40 strokes over the upper left chest as  indicated in your monitor instructions.  Clean area with 4 enclosed alcohol pads. Let dry.  Apply patch as indicated in monitor instructions. Patch will be placed under collarbone on left  side of chest with arrow pointing upward.  Rub patch adhesive wings for 2 minutes. Remove white label marked "1". Remove the white  label marked "2". Rub patch adhesive wings for 2 additional minutes.  While looking in a mirror, press and release button in center of patch. A small green light will  flash 3-4 times. This will be your only indicator that the monitor has been turned on.  Do not shower for the first 24 hours. You may shower after the first 24 hours.  Press the button if you feel a symptom. You will hear a small click. Record Date, Time and  Symptom in the Patient Logbook.  When you are ready to remove the patch, follow instructions on the last 2 pages of Patient  Logbook. Stick patch monitor onto the last page of Patient Logbook.  Place Patient Logbook in the blue and white box. Use locking tab on box and tape box closed  securely. The blue and white box  has prepaid postage on it. Please place it in the mailbox as  soon as possible. Your physician should have your test results approximately 7 days after the  monitor has been mailed back to [facility].  Call [place] at [phone] if you have questions regarding  your ZIO XT patch monitor. Call them immediately if you see an orange light blinking on your  monitor.  If your monitor falls off in less than 4 days, contact our Monitor department at [phone].  If your monitor becomes  loose or falls off after 4 days call [employer] at [phone] for  suggestions on securing your monitor   Dr. Sallyanne Kuster has ordered a CT coronary calcium score. This test is done at [address]. This is $99 out of pocket.   Coronary CalciumScan A coronary calcium scan is an imaging test used to look for deposits of calcium and other fatty materials (plaques) in the inner lining of the blood vessels of the heart (coronary arteries). These deposits of calcium and plaques can partly clog and narrow the coronary arteries without producing any symptoms or warning signs. This puts a person at risk for a heart attack. This test can detect these deposits before symptoms develop. Tell a health care provider about: Any allergies you have. All medicines you are taking, including vitamins, herbs, eye drops, creams, and over-the-counter medicines. Any problems you or family members have had with anesthetic medicines. Any blood disorders you have. Any surgeries you have had. Any medical conditions you have. Whether you are pregnant or may be pregnant. What are the risks? Generally, this is a safe procedure. However, problems may occur, including: Harm to a pregnant woman and her unborn baby. This test involves the use of radiation. Radiation exposure can be dangerous to a pregnant woman and her unborn baby. If you are pregnant, you generally should not have this procedure done. Slight increase in the risk of cancer. This is because of the radiation involved in the test. What happens before the procedure? No preparation is needed for this procedure. What happens during the procedure? You will undress and remove any jewelry around your neck or chest. You will put on a hospital gown. Sticky electrodes will be placed on your chest. The electrodes will be connected to an electrocardiogram (ECG) machine to record a tracing of the electrical activity of your heart. A CT scanner will take  pictures of your heart. During this time, you will be asked to lie still and hold your breath for 2-3 seconds while a picture of your heart is being taken. The procedure may vary among health care providers and hospitals. What happens after the procedure? You can get dressed. You can return to your normal activities. It is up to you to get the results of your test. Ask your health care provider, or the department that is doing the test, when your results will be ready. Summary A coronary calcium scan is an imaging test used to look for deposits of calcium and other fatty materials (plaques) in the inner lining of the blood vessels of the heart (coronary arteries). Generally, this is a safe procedure. Tell your health care provider if you are pregnant or may be pregnant. No preparation is needed for this procedure. A CT scanner will take pictures of your heart. You can return to your normal activities after the scan is done. This information is not intended to replace advice given to you by your health care  provider. Make sure you discuss any questions you have with your health care provider. Document Released: 04/25/2008 Document Revised: 09/16/2016 Document Reviewed: 09/16/2016 Elsevier Interactive Patient Education  2017 [place]: At [facility], you and your health needs are our priority.  As part of our continuing mission to provide you with exceptional heart care, we have created designated Provider Care Teams.  These Care Teams include your primary Cardiologist (physician) and Advanced Practice Providers (APPs -  Physician Assistants and Nurse Practitioners) who all work together to provide you with the care you need, when you need it.  We recommend signing up for the patient portal called "MyChart".  Sign up information is provided on this After Visit Summary.  MyChart is used to connect with patients for Virtual Visits (Telemedicine).  Patients are able to view lab/test  results, encounter notes, upcoming appointments, etc.  Non-urgent messages can be sent to your provider as well.   To learn more about what you can do with MyChart, go to [email or web address].    Your next appointment:   6 month(s)  The format for your next appointment:   In Person  Provider:   You may see Dr. Sallyanne Kuster or one of the following Advanced Practice Providers on your designated Care Team:   Almyra Deforest, PA-C Fabian Sharp, [place] or  Roby Lofts, [place]   Other Instructions Syncope Syncope refers to a condition in which a person temporarily loses consciousness. Syncope may also be called fainting or passing out. It is caused by a sudden decrease in blood flow to the brain. Even though most causes of syncope are not dangerous, syncope can be a sign of a serious medical problem. Your health care provider may do tests to find the reason why you are having syncope. Signs that you may be about to faint include: Feeling dizzy or light-headed. Feeling nauseous. Seeing all white or all black in your field of vision. Having cold, clammy skin. If you faint, get medical help right away. Call your local emergency services (911 in the [place] Do not drive yourself to the hospital. Follow these instructions at home: Pay attention to any changes in your symptoms. Take these actions to stay safe and to help relieve your symptoms: Lifestyle Do not drive, use machinery, or play sports until your health care provider says it is okay. Do not drink alcohol. Do not use any products that contain nicotine or tobacco, such as cigarettes and e-cigarettes. If you need help quitting, ask your health care provider. Drink enough fluid to keep your urine pale yellow. General instructions Take over-the-counter and prescription medicines only as told by your health care provider. If you are taking blood pressure or heart medicine, get up slowly and take several minutes to sit and then stand. This can reduce  dizziness or light-headedness. Have someone stay with you until you feel stable. If you start to feel like you might faint, lie down right away and raise (elevate) your feet above the level of your heart. Breathe deeply and steadily. Wait until all the symptoms have passed. Keep all follow-up visits as told by your health care provider. This is important. Get help right away if you: Have a severe headache. Faint once or repeatedly. Have pain in your chest, abdomen, or back. Have a very fast or irregular heartbeat (palpitations). Have pain when you breathe. Are bleeding from your mouth or rectum, or you have black or tarry stool. Have a seizure. Are confused. Have trouble walking. Have  severe weakness. Have vision problems. These symptoms may represent a serious problem that is an emergency. Do not wait to see if your symptoms will go away. Get medical help right away. Call your local emergency services (911 in the [place] Do not drive yourself to the hospital. Summary Syncope refers to a condition in which a person temporarily loses consciousness. It is caused by a sudden decrease in blood flow to the brain. Signs that you may be about to faint include dizziness, feeling light-headed, feeling nauseous, sudden vision changes, or cold, clammy skin. Although most causes of syncope are not dangerous, syncope can be a sign of a serious medical problem. If you faint, get medical help right away. This information is not intended to replace advice given to you by your health care provider. Make sure you discuss any questions you have with your health care provider. Document Revised: 02/08/2020 Document Reviewed: 03/09/2020 Elsevier Patient Education  [place].

## 2021-08-31 DIAGNOSIS — Z8249 Family history of ischemic heart disease and other diseases of the circulatory system: Secondary | ICD-10-CM | POA: Insufficient documentation

## 2021-08-31 DIAGNOSIS — R55 Syncope and collapse: Secondary | ICD-10-CM | POA: Insufficient documentation

## 2021-08-31 NOTE — Progress Notes (Signed)
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 blood flow to the brain. Signs that you may be about to faint include dizziness, feeling light-headed, feeling nauseous, sudden vision changes, or cold, clammy skin. Although most causes of syncope are not dangerous, syncope can be a sign of a serious medical problem. If you faint, get medical help right away. This information is not intended to replace advice given to you by your health care provider. Make sure you discuss any questions you have with your health care provider. Document Revised: 02/08/2020 Document Reviewed: 03/09/2020 Elsevier Patient Education  2022 [place], Sanda Klein, MD  08/31/2021 4:11 PM    [facility]

## 2021-09-07 ENCOUNTER — Other Ambulatory Visit: Payer: Self-pay | Admitting: Physician Assistant

## 2021-09-09 DIAGNOSIS — R002 Palpitations: Secondary | ICD-10-CM

## 2021-09-09 DIAGNOSIS — R55 Syncope and collapse: Secondary | ICD-10-CM | POA: Diagnosis not present

## 2021-09-17 ENCOUNTER — Encounter: Payer: Self-pay | Admitting: Physician Assistant

## 2021-09-17 ENCOUNTER — Other Ambulatory Visit: Payer: Self-pay

## 2021-09-17 ENCOUNTER — Ambulatory Visit (INDEPENDENT_AMBULATORY_CARE_PROVIDER_SITE_OTHER): Payer: Medicare Other | Admitting: Physician Assistant

## 2021-09-17 DIAGNOSIS — M549 Dorsalgia, unspecified: Secondary | ICD-10-CM | POA: Diagnosis not present

## 2021-09-17 DIAGNOSIS — G894 Chronic pain syndrome: Secondary | ICD-10-CM | POA: Diagnosis not present

## 2021-09-17 DIAGNOSIS — M961 Postlaminectomy syndrome, not elsewhere classified: Secondary | ICD-10-CM | POA: Diagnosis not present

## 2021-09-17 DIAGNOSIS — M79604 Pain in right leg: Secondary | ICD-10-CM | POA: Diagnosis not present

## 2021-09-17 DIAGNOSIS — F411 Generalized anxiety disorder: Secondary | ICD-10-CM | POA: Diagnosis not present

## 2021-09-17 DIAGNOSIS — R1031 Right lower quadrant pain: Secondary | ICD-10-CM | POA: Diagnosis not present

## 2021-09-17 DIAGNOSIS — D869 Sarcoidosis, unspecified: Secondary | ICD-10-CM | POA: Diagnosis not present

## 2021-09-17 DIAGNOSIS — C419 Malignant neoplasm of bone and articular cartilage, unspecified: Secondary | ICD-10-CM | POA: Diagnosis not present

## 2021-09-17 DIAGNOSIS — Z6379 Other stressful life events affecting family and household: Secondary | ICD-10-CM

## 2021-09-17 DIAGNOSIS — M5416 Radiculopathy, lumbar region: Secondary | ICD-10-CM | POA: Diagnosis not present

## 2021-09-17 DIAGNOSIS — M5412 Radiculopathy, cervical region: Secondary | ICD-10-CM | POA: Diagnosis not present

## 2021-09-17 DIAGNOSIS — M5020 Other cervical disc displacement, unspecified cervical region: Secondary | ICD-10-CM | POA: Diagnosis not present

## 2021-09-17 DIAGNOSIS — M79661 Pain in right lower leg: Secondary | ICD-10-CM | POA: Diagnosis not present

## 2021-09-17 DIAGNOSIS — F3341 Major depressive disorder, recurrent, in partial remission: Secondary | ICD-10-CM | POA: Diagnosis not present

## 2021-09-17 NOTE — Progress Notes (Signed)
[facility]  Patient ID: KEELY DRENNAN,  MRN: [id number]  PCP: Mayra Neer, MD  Date of Evaluation: 09/17/2021 Time spent:20 minutes  Chief Complaint:  Chief Complaint   Anxiety; Depression; Insomnia; Follow-up     HISTORY/CURRENT STATUS: For routine med check.  Still anxious a lot, circumstantial, her grandson is now home from hospital, lives with her. He still has trach and g-tube and has a nurse 24 hours/day.  "He is a miracle."  She takes the Klonopin when needed and on occasion has taken 2 at 1 time.  Never takes more than 2 pills/day as directed though.  Not having panic attacks so much now is a generalized sense of unease.  Her faith continues to help with the anxiety.  She is able to enjoy things.  She continues to have chronic pain which does limit her.  Energy and motivation are good depending on the circumstances.  Not isolating.  Does not cry easily.  Not working.  Sleeps pretty good.  No suicidal or homicidal thoughts.  Patient denies increased energy with decreased need for sleep, no increased talkativeness, no racing thoughts, no impulsivity or risky behaviors, no increased spending, no increased libido, no grandiosity, no increased irritability or anger, and no hallucinations.  Denies dizziness, syncope, seizures, numbness, tingling, tremor, tics, unsteady gait, slurred speech, confusion. Denies muscle or joint pain, stiffness, or dystonia.  Individual Medical History/ Review of Systems: Changes? :No      Past medications for mental health diagnoses include: Klonopin, Zoloft, Paxil, Celexa, Ativan, Ambien caused sleep talking, Lamictal she preferred not to take  Allergies: Etodolac, Ambien [zolpidem tartrate], and Namenda [memantine]  Current Medications:  Current Outpatient Medications:    clonazePAM (KLONOPIN) 0.5 MG tablet, TAKE 1 TABLET(0.5 MG) BY MOUTH TWICE DAILY AS NEEDED FOR ANXIETY, Disp: 60 tablet, Rfl: 0   cloNIDine (CATAPRES) 0.1 MG  tablet, Take 0.1 mg by mouth 2 (two) times daily., Disp: , Rfl:    clopidogrel (PLAVIX) 75 MG tablet, Take 1 tablet (75 mg total) by mouth daily., Disp: 90 tablet, Rfl: 3   gabapentin (NEURONTIN) 800 MG tablet, Take 800 mg by mouth 4 (four) times daily., Disp: , Rfl: 1   oxyCODONE-acetaminophen (PERCOCET) 10-325 MG tablet, Take 1 tablet by mouth every 6 (six) hours as needed., Disp: , Rfl:    sertraline (ZOLOFT) 100 MG tablet, Take 2 tablets (200 mg total) by mouth daily., Disp: 180 tablet, Rfl: 0   aspirin EC 81 MG tablet, , Disp: , Rfl:    Tapentadol HCl (NUCYNTA) 100 MG TABS, Take 100 mg by mouth every 6 (six) hours. (Patient not taking: No sig reported), Disp: , Rfl:  Medication Side Effects: none  Family Medical/ Social History: Changes? See HPI.  MENTAL HEALTH EXAM:  Last menstrual period 10/16/2011.There is no height or weight on file to calculate BMI.  General Appearance: Casual, Neat and Well Groomed  Eye Contact:  Good  Speech:  Clear and Coherent and Normal Rate  Volume:  Normal  Mood:   sad but also joyful when talking about the miracle of  her grandson home from hospital  Affect:  Congruent  Thought Process:  Goal Directed and Descriptions of Associations: Intact  Orientation:  Full (Time, Place, and Person)  Thought Content: Logical   Suicidal Thoughts:  No  Homicidal Thoughts:  No  Memory:  WNL  Judgement:  Fair  Insight:  Fair  Psychomotor Activity:  Decreased and not using cane now.   Concentration:  Concentration:  Good and Attention Span: Good  Recall:  Good  Fund of Knowledge: Good  Language: Good  Assets:  Desire for Improvement  ADL's:  Intact  Cognition: WNL  Prognosis:  Good    DIAGNOSES:    ICD-10-CM   1. Recurrent major depressive disorder, in partial remission ([place])  F33.41     2. Generalized anxiety disorder  F41.1     3. Stress due to illness of family member  Z63.79         Receiving Psychotherapy: No    RECOMMENDATIONS:  PDMP was  reviewed. Last Klonopin 09/07/2021.  Oxycodone filled 08/14/2021. I provided 20 minutes of face to face time during this encounter, including time spent before and after the visit in records review, medical decision making, and charting.  She is doing well especially under the circumstances.  We did discuss the possibility of increasing the Zoloft and Klonopin if needed.  She will let me know if the anxiety and depression worsen over the next few months and will increase the Zoloft and or the Klonopin if needed.  She and I both prefer to leave things the same for now. Continue Klonopin 0.5 mg, 1-2 pills bid, ok to increase qty when needs refills.  Cont Zoloft 100 mg, 2 po qd. Ok to increase the dose if needed.  Continue gabapentin 800 mg, 1 p.o. 3 times daily. Continue clonidine 0.1 mg, 1 p.o. twice daily. Return in 3 months.  Donnal Moat, PA-C

## 2021-10-17 ENCOUNTER — Telehealth: Payer: Self-pay | Admitting: Physician Assistant

## 2021-10-17 NOTE — Telephone Encounter (Signed)
Patient lm stating she was informed by Helene Kelp to call if things got "hairy". She stated she is having a really hard time trying to handle things. A return call is requested as soon as possible. Patient was last seen in the office on 11/7 with a follow up scheduled for 2/8.

## 2021-10-17 NOTE — Telephone Encounter (Signed)
Tried calling patient at both #s listed for her and did not get an answer at either # and was not able to LM.

## 2021-10-18 NOTE — Telephone Encounter (Signed)
Called again. Husband said she left her phone in his car, but that he was headed home and he would have her call us back.

## 2021-10-18 NOTE — Telephone Encounter (Signed)
Tried to call patient. One phone # does not answer the mobile mailbox says mailbox has not been setup yet.

## 2021-10-19 NOTE — Telephone Encounter (Signed)
Called both # for patient and no answer on home phone and VM not set up on mobile. Called husband's mobile and he said he couldn't get her awake enough to talk. I asked that he let her know we had called and to call us if needed.

## 2021-10-22 ENCOUNTER — Telehealth: Payer: Self-pay | Admitting: Cardiovascular Disease

## 2021-10-22 NOTE — Telephone Encounter (Signed)
New Message:     Please call Taryn asap about this patient please

## 2021-10-22 NOTE — Telephone Encounter (Signed)
Spoke with Nunzio Cobbs. Patient has been unable to be reached to schedule her Cardiac Score test.

## 2021-11-13 ENCOUNTER — Telehealth: Payer: Self-pay

## 2021-11-13 ENCOUNTER — Other Ambulatory Visit: Payer: Self-pay | Admitting: Physician Assistant

## 2021-11-13 MED ORDER — CLONAZEPAM 0.5 MG PO TABS: 0.5000 mg | ORAL_TABLET | Freq: Three times a day (TID) | ORAL | 1 refills | Status: DC | PRN

## 2021-11-13 NOTE — Telephone Encounter (Signed)
Pt requesting refill for Clonazepam and reminder @ refill agreed to increase to 3/d #90. [name]. Apt 2/8. Pt # [phone]

## 2021-11-15 ENCOUNTER — Other Ambulatory Visit: Payer: Self-pay | Admitting: Physician Assistant

## 2021-11-16 ENCOUNTER — Telehealth: Payer: Self-pay | Admitting: Physician Assistant

## 2021-11-16 NOTE — Telephone Encounter (Signed)
Spoke to pharmacy and approved early refill.Unable to reach pt

## 2021-11-16 NOTE — Telephone Encounter (Signed)
Next visit is 12/19/21. Becky Wolfe states that her Clonazepam was increased on her last visit in 12/22 from 2/day to 3/day. When she went to get it refilled pharmacy told her that it was too early to fill. She she went from 2/day to 3/day can it be filled earlier? Pharmacy is:  [id number] [address], [place] AT [address]  Phone:  [phone]  Fax:  [phone]

## 2021-11-19 ENCOUNTER — Other Ambulatory Visit: Payer: Self-pay

## 2021-11-19 MED ORDER — SERTRALINE HCL 100 MG PO TABS: 200.0000 mg | ORAL_TABLET | Freq: Every day | ORAL | 0 refills | Status: DC

## 2021-11-19 NOTE — Telephone Encounter (Signed)
Rx sent 

## 2021-11-19 NOTE — Telephone Encounter (Signed)
Pt called back today and LVM regarding the refill of Sertraline.  She said she has been out of it for a few days now.

## 2021-12-05 DIAGNOSIS — N6459 Other signs and symptoms in breast: Secondary | ICD-10-CM | POA: Diagnosis not present

## 2021-12-06 ENCOUNTER — Other Ambulatory Visit: Payer: Self-pay | Admitting: Obstetrics and Gynecology

## 2021-12-06 DIAGNOSIS — N6310 Unspecified lump in the right breast, unspecified quadrant: Secondary | ICD-10-CM

## 2021-12-07 DIAGNOSIS — R55 Syncope and collapse: Secondary | ICD-10-CM | POA: Diagnosis not present

## 2021-12-07 DIAGNOSIS — R002 Palpitations: Secondary | ICD-10-CM | POA: Diagnosis not present

## 2021-12-10 ENCOUNTER — Encounter: Payer: Self-pay | Admitting: *Deleted

## 2021-12-11 DIAGNOSIS — G894 Chronic pain syndrome: Secondary | ICD-10-CM | POA: Diagnosis not present

## 2021-12-11 DIAGNOSIS — D869 Sarcoidosis, unspecified: Secondary | ICD-10-CM | POA: Diagnosis not present

## 2021-12-11 DIAGNOSIS — M5020 Other cervical disc displacement, unspecified cervical region: Secondary | ICD-10-CM | POA: Diagnosis not present

## 2021-12-11 DIAGNOSIS — M5416 Radiculopathy, lumbar region: Secondary | ICD-10-CM | POA: Diagnosis not present

## 2021-12-11 DIAGNOSIS — G90523 Complex regional pain syndrome I of lower limb, bilateral: Secondary | ICD-10-CM | POA: Diagnosis not present

## 2021-12-11 DIAGNOSIS — M5412 Radiculopathy, cervical region: Secondary | ICD-10-CM | POA: Diagnosis not present

## 2021-12-11 DIAGNOSIS — M79604 Pain in right leg: Secondary | ICD-10-CM | POA: Diagnosis not present

## 2021-12-11 DIAGNOSIS — M549 Dorsalgia, unspecified: Secondary | ICD-10-CM | POA: Diagnosis not present

## 2021-12-11 DIAGNOSIS — C419 Malignant neoplasm of bone and articular cartilage, unspecified: Secondary | ICD-10-CM | POA: Diagnosis not present

## 2021-12-11 DIAGNOSIS — M961 Postlaminectomy syndrome, not elsewhere classified: Secondary | ICD-10-CM | POA: Diagnosis not present

## 2021-12-11 DIAGNOSIS — R1031 Right lower quadrant pain: Secondary | ICD-10-CM | POA: Diagnosis not present

## 2021-12-14 ENCOUNTER — Telehealth: Payer: Self-pay

## 2021-12-14 ENCOUNTER — Other Ambulatory Visit: Payer: Self-pay | Admitting: Physician Assistant

## 2021-12-14 MED ORDER — QUETIAPINE FUMARATE 200 MG PO TABS: ORAL_TABLET | ORAL | 0 refills | Status: DC
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
## 2021-12-14 NOTE — Telephone Encounter (Signed)
Patient's husband came into the office today due to concerns with his wife's behavior. He said she is demonstrating paranoia. She says people are coming into the house. I asked if she just thinks this or she has actually seen people. He said one night the motion lights inside went off, but she hasn't actually seen people. She has heard people in the attic and under the house. She thinks people are spying on her through the wiring and she is pulling the wiring out of the wall, sticking screwdrivers in the outlets. There is a gun in the house and husband said patient has gotten the gun before. Patient has an appt 2/8. Husband does not want patient to know that he revealed this information. Patient said he talked to Dr. Mechele Dawley, her pain management doctor, and he said his behavior is not related to any of the medications he prescribes. Husband also says patient is "heavily sedated."    Husband's # [phone]. Husband said if someone calls he will not discuss these issues if patient is around. He said he will try to come to her 2/8 visit.    12/03/2021  Oxycodone-Acetaminophen 10-325 32.00 8 [place]  11/16/2021  Clonazepam 0.5 Mg Tablet 90.00 30 [place]  10/23/2021  Clonazepam 0.5 Mg Tablet 60.00 30 [place]  09/18/2021  Oxycodone-Acetaminophen 10-325 120.00 30 [place]  09/07/2021  Clonazepam 0.5 Mg Tablet 90.00 30 [place]  08/14/2021  Oxycodone-Acetaminophen 10-325 120.00 30 [place]  07/20/2021  Nucynta 100 Mg Tablet 100.00 25 [place]  07/17/2021  Clonazepam 0.5 Mg Tablet 90.00 30 [place]  06/03/2021  Clonazepam 0.5 Mg Tablet 60.00 30 [place]  05/24/2021  Nucynta 100 Mg Tablet 120.00 30 [place]  04/17/2021  Clonazepam 0.5 Mg Tablet 60.00 30 [place]  04/04/2021  Nucynta 100 Mg Tablet 120.00 30 [place]  03/15/2021  Clonazepam 0.5 Mg Tablet 60.00 30 [place]  03/10/2021  Nucynta 100 Mg Tablet 60.00 15 [place]  02/23/2021  Nucynta 100 Mg Tablet 60.00 15 [place]  Patient calls [place] frequently  but does not answer when we call back most of the time.
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
## 2021-12-14 NOTE — Telephone Encounter (Signed)
I called husband. I gave him your recommendations. He said "I don't think she knows what will happen to our family if I did that", referring to taking patient to [facility].  I told him that was your recommendations. He said he would secure the gun.

## 2021-12-14 NOTE — Telephone Encounter (Signed)
Information about [place] provided.

## 2021-12-19 ENCOUNTER — Encounter: Payer: Self-pay | Admitting: Physician Assistant

## 2021-12-19 ENCOUNTER — Other Ambulatory Visit: Payer: Self-pay

## 2021-12-19 ENCOUNTER — Ambulatory Visit (INDEPENDENT_AMBULATORY_CARE_PROVIDER_SITE_OTHER): Payer: Medicare Other | Admitting: Physician Assistant

## 2021-12-19 DIAGNOSIS — F99 Mental disorder, not otherwise specified: Secondary | ICD-10-CM

## 2021-12-19 DIAGNOSIS — R5383 Other fatigue: Secondary | ICD-10-CM

## 2021-12-19 DIAGNOSIS — F331 Major depressive disorder, recurrent, moderate: Secondary | ICD-10-CM | POA: Diagnosis not present

## 2021-12-19 DIAGNOSIS — F411 Generalized anxiety disorder: Secondary | ICD-10-CM | POA: Diagnosis not present

## 2021-12-19 DIAGNOSIS — F5105 Insomnia due to other mental disorder: Secondary | ICD-10-CM | POA: Diagnosis not present

## 2021-12-19 DIAGNOSIS — Z6379 Other stressful life events affecting family and household: Secondary | ICD-10-CM | POA: Diagnosis not present

## 2021-12-19 DIAGNOSIS — R5381 Other malaise: Secondary | ICD-10-CM

## 2021-12-19 DIAGNOSIS — Z79899 Other long term (current) drug therapy: Secondary | ICD-10-CM

## 2021-12-19 MED ORDER — QUETIAPINE FUMARATE 200 MG PO TABS: 200.0000 mg | ORAL_TABLET | Freq: Every day | ORAL | 1 refills | Status: DC
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
## 2021-12-26 ENCOUNTER — Other Ambulatory Visit: Payer: Medicare Other

## 2022-01-14 ENCOUNTER — Ambulatory Visit
Admission: RE | Admit: 2022-01-14 | Discharge: 2022-01-14 | Disposition: A | Discharge status: Home / Self Care | Payer: Medicare Other | Source: Ambulatory Visit | Attending: Obstetrics and Gynecology | Admitting: Obstetrics and Gynecology

## 2022-01-14 DIAGNOSIS — H6993 Unspecified Eustachian tube disorder, bilateral: Secondary | ICD-10-CM | POA: Insufficient documentation

## 2022-01-14 DIAGNOSIS — R922 Inconclusive mammogram: Secondary | ICD-10-CM | POA: Diagnosis not present

## 2022-01-14 DIAGNOSIS — J310 Chronic rhinitis: Secondary | ICD-10-CM | POA: Diagnosis not present

## 2022-01-14 DIAGNOSIS — N6310 Unspecified lump in the right breast, unspecified quadrant: Secondary | ICD-10-CM

## 2022-01-14 DIAGNOSIS — H6983 Other specified disorders of Eustachian tube, bilateral: Secondary | ICD-10-CM | POA: Diagnosis not present

## 2022-01-17 ENCOUNTER — Ambulatory Visit (INDEPENDENT_AMBULATORY_CARE_PROVIDER_SITE_OTHER): Payer: Medicare Other | Admitting: Physician Assistant

## 2022-01-17 ENCOUNTER — Encounter: Payer: Self-pay | Admitting: Physician Assistant

## 2022-01-17 ENCOUNTER — Other Ambulatory Visit: Payer: Self-pay

## 2022-01-17 DIAGNOSIS — F5105 Insomnia due to other mental disorder: Secondary | ICD-10-CM

## 2022-01-17 DIAGNOSIS — F331 Major depressive disorder, recurrent, moderate: Secondary | ICD-10-CM | POA: Diagnosis not present

## 2022-01-17 DIAGNOSIS — R443 Hallucinations, unspecified: Secondary | ICD-10-CM

## 2022-01-17 DIAGNOSIS — F411 Generalized anxiety disorder: Secondary | ICD-10-CM | POA: Diagnosis not present

## 2022-01-17 DIAGNOSIS — Z6379 Other stressful life events affecting family and household: Secondary | ICD-10-CM

## 2022-01-17 DIAGNOSIS — F99 Mental disorder, not otherwise specified: Secondary | ICD-10-CM

## 2022-01-17 MED ORDER — QUETIAPINE FUMARATE 300 MG PO TABS: 300.0000 mg | ORAL_TABLET | Freq: Every day | ORAL | 1 refills | Status: DC
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
## 2022-01-30 DIAGNOSIS — G43109 Migraine with aura, not intractable, without status migrainosus: Secondary | ICD-10-CM | POA: Diagnosis not present

## 2022-01-30 DIAGNOSIS — L989 Disorder of the skin and subcutaneous tissue, unspecified: Secondary | ICD-10-CM | POA: Diagnosis not present

## 2022-01-30 DIAGNOSIS — Z8619 Personal history of other infectious and parasitic diseases: Secondary | ICD-10-CM | POA: Diagnosis not present

## 2022-01-30 DIAGNOSIS — D869 Sarcoidosis, unspecified: Secondary | ICD-10-CM | POA: Diagnosis not present

## 2022-01-30 DIAGNOSIS — I10 Essential (primary) hypertension: Secondary | ICD-10-CM | POA: Diagnosis not present

## 2022-01-30 DIAGNOSIS — K7460 Unspecified cirrhosis of liver: Secondary | ICD-10-CM | POA: Diagnosis not present

## 2022-01-30 DIAGNOSIS — G9050 Complex regional pain syndrome I, unspecified: Secondary | ICD-10-CM | POA: Diagnosis not present

## 2022-01-30 DIAGNOSIS — Z720 Tobacco use: Secondary | ICD-10-CM | POA: Diagnosis not present

## 2022-01-30 DIAGNOSIS — E041 Nontoxic single thyroid nodule: Secondary | ICD-10-CM | POA: Diagnosis not present

## 2022-01-30 DIAGNOSIS — I2510 Atherosclerotic heart disease of native coronary artery without angina pectoris: Secondary | ICD-10-CM | POA: Diagnosis not present

## 2022-01-30 DIAGNOSIS — Z0000 Encounter for general adult medical examination without abnormal findings: Secondary | ICD-10-CM | POA: Diagnosis not present

## 2022-02-04 ENCOUNTER — Other Ambulatory Visit: Payer: Self-pay | Admitting: Physician Assistant

## 2022-02-04 ENCOUNTER — Other Ambulatory Visit: Payer: Self-pay | Admitting: Family Medicine

## 2022-02-04 DIAGNOSIS — E041 Nontoxic single thyroid nodule: Secondary | ICD-10-CM

## 2022-02-05 DIAGNOSIS — M5412 Radiculopathy, cervical region: Secondary | ICD-10-CM | POA: Diagnosis not present

## 2022-02-05 DIAGNOSIS — Z5181 Encounter for therapeutic drug level monitoring: Secondary | ICD-10-CM | POA: Diagnosis not present

## 2022-02-05 DIAGNOSIS — M79661 Pain in right lower leg: Secondary | ICD-10-CM | POA: Diagnosis not present

## 2022-02-05 DIAGNOSIS — M961 Postlaminectomy syndrome, not elsewhere classified: Secondary | ICD-10-CM | POA: Diagnosis not present

## 2022-02-05 DIAGNOSIS — M79604 Pain in right leg: Secondary | ICD-10-CM | POA: Diagnosis not present

## 2022-02-05 DIAGNOSIS — G90523 Complex regional pain syndrome I of lower limb, bilateral: Secondary | ICD-10-CM | POA: Diagnosis not present

## 2022-02-05 DIAGNOSIS — M79605 Pain in left leg: Secondary | ICD-10-CM | POA: Diagnosis not present

## 2022-02-05 DIAGNOSIS — Z79899 Other long term (current) drug therapy: Secondary | ICD-10-CM | POA: Diagnosis not present

## 2022-02-05 NOTE — Telephone Encounter (Signed)
Clonazepam RF ?

## 2022-02-13 DIAGNOSIS — K7469 Other cirrhosis of liver: Secondary | ICD-10-CM | POA: Diagnosis not present

## 2022-02-13 DIAGNOSIS — Z8673 Personal history of transient ischemic attack (TIA), and cerebral infarction without residual deficits: Secondary | ICD-10-CM | POA: Insufficient documentation

## 2022-02-14 ENCOUNTER — Other Ambulatory Visit: Payer: Self-pay | Admitting: Nurse Practitioner

## 2022-02-14 DIAGNOSIS — K7469 Other cirrhosis of liver: Secondary | ICD-10-CM

## 2022-02-19 ENCOUNTER — Other Ambulatory Visit: Payer: Self-pay | Admitting: Physician Assistant

## 2022-02-20 ENCOUNTER — Telehealth: Payer: Self-pay | Admitting: *Deleted

## 2022-02-20 ENCOUNTER — Encounter: Payer: Self-pay | Admitting: *Deleted

## 2022-02-20 ENCOUNTER — Ambulatory Visit
Admission: RE | Admit: 2022-02-20 | Discharge: 2022-02-20 | Disposition: A | Discharge status: Home / Self Care | Payer: Medicare Other | Source: Ambulatory Visit | Attending: Family Medicine | Admitting: Family Medicine

## 2022-02-20 DIAGNOSIS — E041 Nontoxic single thyroid nodule: Secondary | ICD-10-CM

## 2022-02-20 DIAGNOSIS — E042 Nontoxic multinodular goiter: Secondary | ICD-10-CM | POA: Diagnosis not present

## 2022-02-20 NOTE — Telephone Encounter (Signed)
Attempted to contact pt to schedule a f/u lung screening CT scan. Was unable to leave a message at either number. Letter mailed to pt asking her to contact the office to schedule.  ?

## 2022-02-21 ENCOUNTER — Ambulatory Visit (INDEPENDENT_AMBULATORY_CARE_PROVIDER_SITE_OTHER): Payer: Self-pay | Admitting: Physician Assistant

## 2022-02-21 ENCOUNTER — Ambulatory Visit
Admission: RE | Admit: 2022-02-21 | Discharge: 2022-02-21 | Disposition: A | Discharge status: Home / Self Care | Payer: Medicare Other | Source: Ambulatory Visit | Attending: Nurse Practitioner | Admitting: Nurse Practitioner

## 2022-02-21 DIAGNOSIS — K7460 Unspecified cirrhosis of liver: Secondary | ICD-10-CM | POA: Diagnosis not present

## 2022-02-21 DIAGNOSIS — K7469 Other cirrhosis of liver: Secondary | ICD-10-CM

## 2022-02-21 DIAGNOSIS — Z91199 Patient's noncompliance with other medical treatment and regimen due to unspecified reason: Secondary | ICD-10-CM

## 2022-02-21 NOTE — Progress Notes (Signed)
   Complete physical exam  Patient: Becky Wolfe   DOB: 08/31/2000   61 y.o. Female  MRN: [id number]  Subjective:    No chief complaint on file.   Becky Wolfe is a 61 y.o. female who presents today for a complete physical exam. She reports consuming a {diet types:17450} diet. {types:19826} She generally feels {DESC; WELL/FAIRLY WELL/POORLY:18703}. She reports sleeping {DESC; WELL/FAIRLY WELL/POORLY:18703}. She {does/does not:200015} have additional problems to discuss today.    Most recent fall risk assessment:    05/08/2022   10:42 AM  Fall Risk   Falls in the past year? 0  Number falls in past yr: 0  Injury with Fall? 0  Risk for fall due to : No Fall Risks  Follow up Falls evaluation completed     Most recent depression screenings:    05/08/2022   10:42 AM 03/29/2021   10:46 AM  PHQ 2/9 Scores  PHQ - 2 Score 0 0  PHQ- 9 Score 5     {VISON DENTAL STD PSA (Optional):27386}  {History (Optional):23778}  Patient Care Team: Jessup, Joy, NP as PCP - General (Nurse Practitioner)   Outpatient Medications Prior to Visit  Medication Sig   fluticasone (FLONASE) 50 MCG/ACT nasal spray Place 2 sprays into both nostrils in the morning and at bedtime. After 7 days, reduce to once daily.   norgestimate-ethinyl estradiol (SPRINTEC 28) 0.25-35 MG-MCG tablet Take 1 tablet by mouth daily.   Nystatin POWD Apply liberally to affected area 2 times per day   spironolactone (ALDACTONE) 100 MG tablet Take 1 tablet (100 mg total) by mouth daily.   No facility-administered medications prior to visit.    ROS        Objective:     There were no vitals taken for this visit. {Vitals History (Optional):23777}  Physical Exam   No results found for any visits on 06/13/22. {Show previous labs (optional):23779}    Assessment & Plan:    Routine Health Maintenance and Physical Exam  Immunization History  Administered Date(s) Administered   DTaP 11/14/1999, 01/10/2000,  03/20/2000, 12/04/2000, 06/19/2004   Hepatitis A 04/15/2008, 04/21/2009   Hepatitis B 09/01/1999, 10/09/1999, 03/20/2000   HiB (PRP-OMP) 11/14/1999, 01/10/2000, 03/20/2000, 12/04/2000   IPV 11/14/1999, 01/10/2000, 09/08/2000, 06/19/2004   Influenza,inj,Quad PF,6+ Mos 07/22/2014   Influenza-Unspecified 10/21/2012   MMR 09/08/2001, 06/19/2004   Meningococcal Polysaccharide 04/20/2012   Pneumococcal Conjugate-13 12/04/2000   Pneumococcal-Unspecified 03/20/2000, 06/03/2000   Tdap 04/20/2012   Varicella 09/08/2000, 04/15/2008    Health Maintenance  Topic Date Due   HIV Screening  Never done   Hepatitis C Screening  Never done   INFLUENZA VACCINE  06/11/2022   PAP-Cervical Cytology Screening  06/13/2022 (Originally 08/30/2020)   PAP SMEAR-Modifier  06/13/2022 (Originally 08/30/2020)   TETANUS/TDAP  06/13/2022 (Originally 04/20/2022)   HPV VACCINES  Discontinued   COVID-19 Vaccine  Discontinued    Discussed health benefits of physical activity, and encouraged her to engage in regular exercise appropriate for her age and condition.  Problem List Items Addressed This Visit   None Visit Diagnoses     Annual physical exam    -  Primary   Cervical cancer screening       Need for Tdap vaccination          No follow-ups on file.     Joy Jessup, NP   

## 2022-02-21 NOTE — Telephone Encounter (Signed)
She has apt this morning  ?

## 2022-02-21 NOTE — Telephone Encounter (Signed)
She missed her appt this morning.  She left a voice mail at 9:56a that she had requested the pharmacy to send a request in for the Sertraline.  She said she is going on 4 days without her meds. I tried to call her back to see if she realized she had missed her appt today, but had to LVM.  Pls send the refill in.  She also stated that her husband brought home the Quetiapine 200 and 300 mg. ? ? ?

## 2022-02-28 NOTE — Telephone Encounter (Signed)
Patient left message on VM regarding CT appointment.  States her phone is not working and we have permission to call her husband, Jori Moll, at [phone] to set up appt for CT scan.  She stated any health information can be shared with him, as needed, to set up this appointment.  Called husband's phone but no answer. Left VM to call back regarding [facility] appt ?

## 2022-03-06 NOTE — Telephone Encounter (Signed)
Returned call to patient on #[phone]. No answer and no voicemail picked up.  Patient requesting to schedule LDCT ?

## 2022-03-12 ENCOUNTER — Other Ambulatory Visit: Payer: Self-pay | Admitting: *Deleted

## 2022-03-12 DIAGNOSIS — Z122 Encounter for screening for malignant neoplasm of respiratory organs: Secondary | ICD-10-CM

## 2022-03-12 DIAGNOSIS — F17210 Nicotine dependence, cigarettes, uncomplicated: Secondary | ICD-10-CM

## 2022-03-12 DIAGNOSIS — Z87891 Personal history of nicotine dependence: Secondary | ICD-10-CM

## 2022-03-14 ENCOUNTER — Encounter: Payer: Self-pay | Admitting: Physician Assistant

## 2022-03-14 ENCOUNTER — Ambulatory Visit (INDEPENDENT_AMBULATORY_CARE_PROVIDER_SITE_OTHER): Payer: Medicare Other | Admitting: Physician Assistant

## 2022-03-14 DIAGNOSIS — F411 Generalized anxiety disorder: Secondary | ICD-10-CM | POA: Diagnosis not present

## 2022-03-14 DIAGNOSIS — Z6379 Other stressful life events affecting family and household: Secondary | ICD-10-CM | POA: Diagnosis not present

## 2022-03-14 DIAGNOSIS — F99 Mental disorder, not otherwise specified: Secondary | ICD-10-CM

## 2022-03-14 DIAGNOSIS — F5105 Insomnia due to other mental disorder: Secondary | ICD-10-CM | POA: Diagnosis not present

## 2022-03-14 DIAGNOSIS — F3341 Major depressive disorder, recurrent, in partial remission: Secondary | ICD-10-CM

## 2022-03-14 MED ORDER — QUETIAPINE FUMARATE 300 MG PO TABS: 300.0000 mg | ORAL_TABLET | Freq: Every day | ORAL | 1 refills | Status: DC

## 2022-03-14 MED ORDER — SERTRALINE HCL 100 MG PO TABS: ORAL_TABLET | ORAL | 1 refills | Status: DC

## 2022-03-14 MED ORDER — CLONAZEPAM 0.5 MG PO TABS: ORAL_TABLET | ORAL | 2 refills | Status: DC

## 2022-03-14 NOTE — Progress Notes (Signed)
[facility] ? ?Patient ID: Becky Wolfe,  ?MRN: [id number] ? ?PCP: Mayra Neer, MD ? ?Date of Evaluation: 03/14/2022 ?Time spent:30 minutes ? ?Chief Complaint:  ?Chief Complaint   ?Anxiety; Depression; Follow-up ?  ? ? ?HISTORY/CURRENT STATUS: ?For routine med check.   ? ?At the last visit we increased the Seroquel.  States she has been sleeping better and feels better overall because she is getting more rest.  If she does not take it she is not able to sleep very well at all.  Denies increased energy with decreased need for sleep.  Reports no feelings that things are being moved in her home like she had been having.  Does not feel like someone else has been in the house.  No impulsivity or risky behavior.  No grandiosity.  No hallucinations. ? ?Her grandson continues to improve.  He still has a trach and 24-hour care but he is making some noises now.  "It is so good to hear his voice."  Seeing him get better has really helped her stress level.  She still has anxiety, but it is well controlled with the Klonopin.  She takes it sometimes 2 times a day and sometimes 3 times a day.  The last prescription has lasted her about 5 weeks now and she still has a few left.  Not having panic attacks, more of a generalized sense of unease like something bad is going to happen at any time. ? ?Is able to enjoy things.  Denies decreased energy except on rare occasions when she does not sleep well..  Denies decreased motivation. ADLs and personal hygiene are normal.  Appetite has not changed.  Weight is stable.  No extreme sadness, tearfulness, or feelings of hopelessness.  Denies any changes in concentration, making decisions or remembering things. Denies suicidal or homicidal thoughts. ? ?Denies dizziness, syncope, seizures, numbness, tingling, tremor, tics, unsteady gait, slurred speech, confusion. Denies dystonia.  Does have chronic back pain, on oxycodone as needed. Denies unexplained weight loss, frequent  infections, or sores that heal slowly.  No polyphagia, polydipsia, or polyuria. Denies visual changes or paresthesias.  ? ?Individual Medical History/ Review of Systems: Changes? :Yes   saw PCP for follow-up cirrhosis, ultrasound done see results on chart. ? ?Past medications for mental health diagnoses include: ?Klonopin, Zoloft, Paxil, Celexa, Ativan, Ambien caused sleep talking, Lamictal she preferred not to take ? ?Allergies: Etodolac, Ambien [zolpidem tartrate], and Namenda [memantine] ? ?Current Medications:  ?Current Outpatient Medications:  ?  aspirin EC 81 MG tablet, , Disp: , Rfl:  ?  cloNIDine (CATAPRES) 0.1 MG tablet, Take 0.1 mg by mouth 2 (two) times daily., Disp: , Rfl:  ?  clopidogrel (PLAVIX) 75 MG tablet, Take 1 tablet (75 mg total) by mouth daily., Disp: 90 tablet, Rfl: 3 ?  fluticasone (FLONASE) 50 MCG/ACT nasal spray, Place into the nose., Disp: , Rfl:  ?  gabapentin (NEURONTIN) 800 MG tablet, Take 800 mg by mouth 2 (two) times daily. Bid-tid, Disp: , Rfl: 1 ?  oxyCODONE-acetaminophen (PERCOCET) 10-325 MG tablet, Take 1 tablet by mouth every 6 (six) hours as needed., Disp: , Rfl:  ?  clonazePAM (KLONOPIN) 0.5 MG tablet, TAKE 1 TABLET(0.5 MG) BY MOUTH THREE TIMES DAILY AS NEEDED FOR ANXIETY., Disp: 90 tablet, Rfl: 2 ?  QUEtiapine (SEROQUEL) 300 MG tablet, Take 1 tablet (300 mg total) by mouth at bedtime., Disp: 90 tablet, Rfl: 1 ?  sertraline (ZOLOFT) 100 MG tablet, TAKE 2 TABLETS(200 MG) BY MOUTH DAILY,  Disp: 180 tablet, Rfl: 1 ?  Tapentadol HCl (NUCYNTA) 100 MG TABS, Take 100 mg by mouth every 6 (six) hours. (Patient not taking: Reported on 08/29/2021), Disp: , Rfl:  ?Medication Side Effects: none ? ?Family Medical/ Social History: Changes? See HPI. ? ?MENTAL HEALTH EXAM: ? ?Last menstrual period 10/16/2011.There is no height or weight on file to calculate BMI.  ?General Appearance: Casual, Neat and Well Groomed  ?Eye Contact:  Good  ?Speech:  Clear and Coherent and Normal Rate  ?Volume:   Normal  ?Mood:  Euthymic  ?Affect:  Congruent  ?Thought Process:  Goal Directed and Descriptions of Associations: Circumstantial  ?Orientation:  Full (Time, Place, and Person)  ?Thought Content: Logical   ?Suicidal Thoughts:  No  ?Homicidal Thoughts:  No  ?Memory:  WNL  ?Judgement:  Good  ?Insight:  Good  ?Psychomotor Activity:  Normal  ?Concentration:  Concentration: Good and Attention Span: Good  ?Recall:  Good  ?Fund of Knowledge: Good  ?Language: Good  ?Assets:  Desire for Improvement ?Financial Resources/Insurance ?Housing ?Transportation ?Vocational/Educational  ?ADL's:  Intact  ?Cognition: WNL  ?Prognosis:  Good  ? ? ?DIAGNOSES:  ?  ICD-10-CM   ?1. Recurrent major depressive disorder, in partial remission (HCC)  F33.41   ?  ?2. Generalized anxiety disorder  F41.1   ?  ?3. Insomnia due to other mental disorder  F51.05   ? F99   ?  ?4. Stress due to illness of family member  Z63.79   ?  ? ? ? ?Receiving Psychotherapy: No    ? ? ?RECOMMENDATIONS:  ?PDMP was reviewed. Last Klonopin 02/05/2022.  Oxycodone known to me. ?I provided 30 minutes of face to face time during this encounter, including time spent before and after the visit in records review, medical decision making, counseling pertinent to today's visit, and charting.  ?She is doing better since increasing the Seroquel so no changes will be made.  ? ?Continue Klonopin 0.5 mg, 1 tid prn. ?Continue clonidine 0.1 mg, 1 p.o. twice daily by another provider. ?Continue gabapentin 800 mg, 1 p.o. 3 times daily. (She takes bid to tid.) ?Continue Seroquel 300 mg, q qhs.  (She has a lot of the 200 mg as well.  When she runs out of 300 mg she can take 1.5 pills of the 200 mg.  She verbalizes understanding.) ?Refer to [facility] for therapy.  ?Reminded her to have her labs drawn.  She does follow closely with PCP however, but she has not had lipid panel, CMP, and A1c since May of last year. ?Return in 3 months. ? ?Donnal Moat, PA-C  ?

## 2022-03-19 ENCOUNTER — Telehealth: Payer: Self-pay | Admitting: Physician Assistant
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
## 2022-03-20 ENCOUNTER — Ambulatory Visit (INDEPENDENT_AMBULATORY_CARE_PROVIDER_SITE_OTHER)
Admission: RE | Admit: 2022-03-20 | Discharge: 2022-03-20 | Disposition: A | Discharge status: Home / Self Care | Payer: Medicare Other | Source: Ambulatory Visit | Attending: Acute Care | Admitting: Acute Care

## 2022-03-20 DIAGNOSIS — Z87891 Personal history of nicotine dependence: Secondary | ICD-10-CM

## 2022-03-20 DIAGNOSIS — Z122 Encounter for screening for malignant neoplasm of respiratory organs: Secondary | ICD-10-CM

## 2022-03-20 DIAGNOSIS — F17210 Nicotine dependence, cigarettes, uncomplicated: Secondary | ICD-10-CM | POA: Diagnosis not present

## 2022-03-20 NOTE — Telephone Encounter (Signed)
Thanks for calling the pharmacist. Do they have video of it? I'm concerned b/c she wants Rx to go to [facility] now. To my knowledge, nothing like this has ever happened before and I trust her. It's just odd about the different pharmacy.

## 2022-03-21 ENCOUNTER — Other Ambulatory Visit: Payer: Self-pay | Admitting: Physician Assistant

## 2022-03-21 MED ORDER — CLONAZEPAM 0.5 MG PO TABS: ORAL_TABLET | ORAL | 0 refills | Status: DC
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
## 2022-03-21 NOTE — Telephone Encounter (Signed)
Noted.  Thank you.  Prescription sent. ?

## 2022-03-22 ENCOUNTER — Other Ambulatory Visit: Payer: Self-pay

## 2022-03-22 DIAGNOSIS — Z122 Encounter for screening for malignant neoplasm of respiratory organs: Secondary | ICD-10-CM

## 2022-03-22 DIAGNOSIS — F17210 Nicotine dependence, cigarettes, uncomplicated: Secondary | ICD-10-CM

## 2022-03-22 DIAGNOSIS — Z87891 Personal history of nicotine dependence: Secondary | ICD-10-CM

## 2022-03-26 ENCOUNTER — Other Ambulatory Visit: Payer: Self-pay | Admitting: Physician Assistant

## 2022-04-02 DIAGNOSIS — M79661 Pain in right lower leg: Secondary | ICD-10-CM | POA: Diagnosis not present

## 2022-04-02 DIAGNOSIS — G90523 Complex regional pain syndrome I of lower limb, bilateral: Secondary | ICD-10-CM | POA: Diagnosis not present

## 2022-04-02 DIAGNOSIS — M961 Postlaminectomy syndrome, not elsewhere classified: Secondary | ICD-10-CM | POA: Diagnosis not present

## 2022-04-02 DIAGNOSIS — G894 Chronic pain syndrome: Secondary | ICD-10-CM | POA: Diagnosis not present

## 2022-04-02 DIAGNOSIS — M79605 Pain in left leg: Secondary | ICD-10-CM | POA: Diagnosis not present

## 2022-04-02 DIAGNOSIS — Z79899 Other long term (current) drug therapy: Secondary | ICD-10-CM | POA: Diagnosis not present

## 2022-04-02 DIAGNOSIS — M79604 Pain in right leg: Secondary | ICD-10-CM | POA: Diagnosis not present

## 2022-04-02 DIAGNOSIS — R1031 Right lower quadrant pain: Secondary | ICD-10-CM | POA: Diagnosis not present

## 2022-04-09 DIAGNOSIS — M5412 Radiculopathy, cervical region: Secondary | ICD-10-CM | POA: Diagnosis not present

## 2022-04-09 DIAGNOSIS — G894 Chronic pain syndrome: Secondary | ICD-10-CM | POA: Diagnosis not present

## 2022-04-09 DIAGNOSIS — G90523 Complex regional pain syndrome I of lower limb, bilateral: Secondary | ICD-10-CM | POA: Diagnosis not present

## 2022-04-09 DIAGNOSIS — Z79899 Other long term (current) drug therapy: Secondary | ICD-10-CM | POA: Diagnosis not present

## 2022-04-09 DIAGNOSIS — M961 Postlaminectomy syndrome, not elsewhere classified: Secondary | ICD-10-CM | POA: Diagnosis not present

## 2022-04-13 ENCOUNTER — Encounter (HOSPITAL_BASED_OUTPATIENT_CLINIC_OR_DEPARTMENT_OTHER): Payer: Self-pay | Admitting: Emergency Medicine

## 2022-04-13 ENCOUNTER — Observation Stay (HOSPITAL_BASED_OUTPATIENT_CLINIC_OR_DEPARTMENT_OTHER)
Admission: EM | Admit: 2022-04-13 | Discharge: 2022-04-15 | Disposition: A | Discharge status: Home / Self Care | Payer: Medicare Other | Attending: Internal Medicine | Admitting: Internal Medicine

## 2022-04-13 ENCOUNTER — Other Ambulatory Visit: Payer: Self-pay

## 2022-04-13 ENCOUNTER — Emergency Department (HOSPITAL_BASED_OUTPATIENT_CLINIC_OR_DEPARTMENT_OTHER): Payer: Medicare Other

## 2022-04-13 DIAGNOSIS — D649 Anemia, unspecified: Secondary | ICD-10-CM | POA: Diagnosis present

## 2022-04-13 DIAGNOSIS — H6692 Otitis media, unspecified, left ear: Secondary | ICD-10-CM | POA: Insufficient documentation

## 2022-04-13 DIAGNOSIS — R531 Weakness: Secondary | ICD-10-CM

## 2022-04-13 DIAGNOSIS — R4189 Other symptoms and signs involving cognitive functions and awareness: Secondary | ICD-10-CM

## 2022-04-13 DIAGNOSIS — Z7982 Long term (current) use of aspirin: Secondary | ICD-10-CM | POA: Diagnosis not present

## 2022-04-13 DIAGNOSIS — J9601 Acute respiratory failure with hypoxia: Secondary | ICD-10-CM | POA: Diagnosis present

## 2022-04-13 DIAGNOSIS — Z981 Arthrodesis status: Secondary | ICD-10-CM

## 2022-04-13 DIAGNOSIS — G8929 Other chronic pain: Secondary | ICD-10-CM | POA: Diagnosis not present

## 2022-04-13 DIAGNOSIS — R4182 Altered mental status, unspecified: Secondary | ICD-10-CM

## 2022-04-13 DIAGNOSIS — Z7902 Long term (current) use of antithrombotics/antiplatelets: Secondary | ICD-10-CM | POA: Insufficient documentation

## 2022-04-13 DIAGNOSIS — R7401 Elevation of levels of liver transaminase levels: Secondary | ICD-10-CM | POA: Diagnosis present

## 2022-04-13 DIAGNOSIS — G9340 Encephalopathy, unspecified: Secondary | ICD-10-CM | POA: Diagnosis not present

## 2022-04-13 DIAGNOSIS — Z86718 Personal history of other venous thrombosis and embolism: Secondary | ICD-10-CM | POA: Diagnosis not present

## 2022-04-13 DIAGNOSIS — K7469 Other cirrhosis of liver: Secondary | ICD-10-CM

## 2022-04-13 DIAGNOSIS — F17210 Nicotine dependence, cigarettes, uncomplicated: Secondary | ICD-10-CM | POA: Diagnosis not present

## 2022-04-13 DIAGNOSIS — R509 Fever, unspecified: Secondary | ICD-10-CM

## 2022-04-13 DIAGNOSIS — R7989 Other specified abnormal findings of blood chemistry: Secondary | ICD-10-CM

## 2022-04-13 DIAGNOSIS — G894 Chronic pain syndrome: Secondary | ICD-10-CM | POA: Diagnosis present

## 2022-04-13 DIAGNOSIS — Z8673 Personal history of transient ischemic attack (TIA), and cerebral infarction without residual deficits: Secondary | ICD-10-CM | POA: Diagnosis not present

## 2022-04-13 DIAGNOSIS — Z79899 Other long term (current) drug therapy: Secondary | ICD-10-CM | POA: Insufficient documentation

## 2022-04-13 DIAGNOSIS — N390 Urinary tract infection, site not specified: Secondary | ICD-10-CM | POA: Diagnosis not present

## 2022-04-13 DIAGNOSIS — R5383 Other fatigue: Secondary | ICD-10-CM | POA: Diagnosis not present

## 2022-04-13 DIAGNOSIS — Z8619 Personal history of other infectious and parasitic diseases: Secondary | ICD-10-CM | POA: Diagnosis present

## 2022-04-13 DIAGNOSIS — Z720 Tobacco use: Secondary | ICD-10-CM | POA: Diagnosis present

## 2022-04-13 DIAGNOSIS — M5020 Other cervical disc displacement, unspecified cervical region: Secondary | ICD-10-CM | POA: Diagnosis present

## 2022-04-13 DIAGNOSIS — H6592 Unspecified nonsuppurative otitis media, left ear: Secondary | ICD-10-CM

## 2022-04-13 LAB — CBC WITH DIFFERENTIAL/PLATELET
Abs Immature Granulocytes: 0.05 10*3/uL (ref 0.00–0.07)
Basophils Absolute: 0 10*3/uL (ref 0.0–0.1)
Basophils Relative: 0 %
Eosinophils Absolute: 0.1 10*3/uL (ref 0.0–0.5)
Eosinophils Relative: 1 %
HCT: 31.3 % — ABNORMAL LOW (ref 36.0–46.0)
Hemoglobin: 10.8 g/dL — ABNORMAL LOW (ref 12.0–15.0)
Immature Granulocytes: 1 %
Lymphocytes Relative: 14 %
Lymphs Abs: 1.2 10*3/uL (ref 0.7–4.0)
MCH: 29.3 pg (ref 26.0–34.0)
MCHC: 34.5 g/dL (ref 30.0–36.0)
MCV: 84.8 fL (ref 80.0–100.0)
Monocytes Absolute: 0.6 10*3/uL (ref 0.1–1.0)
Monocytes Relative: 7 %
Neutro Abs: 6.4 10*3/uL (ref 1.7–7.7)
Neutrophils Relative %: 77 %
Platelets: 267 10*3/uL (ref 150–400)
RBC: 3.69 MIL/uL — ABNORMAL LOW (ref 3.87–5.11)
RDW: 13.2 % (ref 11.5–15.5)
WBC: 8.4 10*3/uL (ref 4.0–10.5)
nRBC: 0 % (ref 0.0–0.2)

## 2022-04-13 LAB — COMPREHENSIVE METABOLIC PANEL
ALT: 138 U/L — ABNORMAL HIGH (ref 0–44)
AST: 186 U/L — ABNORMAL HIGH (ref 15–41)
Albumin: 3.9 g/dL (ref 3.5–5.0)
Alkaline Phosphatase: 124 U/L (ref 38–126)
Anion gap: 12 (ref 5–15)
BUN: 9 mg/dL (ref 8–23)
CO2: 24 mmol/L (ref 22–32)
Calcium: 9 mg/dL (ref 8.9–10.3)
Chloride: 99 mmol/L (ref 98–111)
Creatinine, Ser: 0.83 mg/dL (ref 0.44–1.00)
GFR, Estimated: >60 mL/min (ref >60)
Glucose, Bld: 87 mg/dL (ref 70–99)
Potassium: 3.5 mmol/L (ref 3.5–5.1)
Sodium: 135 mmol/L (ref 135–145)
Total Bilirubin: 1.6 mg/dL — ABNORMAL HIGH (ref 0.3–1.2)
Total Protein: 8.7 g/dL — ABNORMAL HIGH (ref 6.5–8.1)

## 2022-04-13 LAB — URINALYSIS, ROUTINE W REFLEX MICROSCOPIC
Glucose, UA: NEGATIVE mg/dL
Ketones, ur: 80 mg/dL — AB
Nitrite: NEGATIVE
Protein, ur: 30 mg/dL — AB
Specific Gravity, Urine: 1.025 (ref 1.005–1.030)
pH: 5.5 (ref 5.0–8.0)

## 2022-04-13 LAB — URINALYSIS, MICROSCOPIC (REFLEX)

## 2022-04-13 LAB — CBG MONITORING, ED: Glucose-Capillary: 90 mg/dL (ref 70–99)

## 2022-04-13 MED ORDER — SODIUM CHLORIDE 0.9 % IV BOLUS
1000.0000 mL | Freq: Once | INTRAVENOUS | Status: AC
  Administered 2022-04-13: 1000 mL via INTRAVENOUS

## 2022-04-13 MED ORDER — SODIUM CHLORIDE 0.9 % IV SOLN
1.0000 g | Freq: Once | INTRAVENOUS | Status: AC
  Administered 2022-04-13: 1 g via INTRAVENOUS
  Filled 2022-04-13: qty 10

## 2022-04-13 NOTE — ED Triage Notes (Addendum)
Pt c/o fatigue since yesterday; sts O2 sats were 89% at home; denies other complaints; pt started new medication for pain, but does not know the name; pt noted to have eyes closed at times and wide open other times (like she is forcing eyes to stay open)

## 2022-04-13 NOTE — ED Notes (Signed)
Asked by RN to ambulate patient per physicians' order. Patient on 3L [place] with oxygen saturation of 93%. Patient very sleepy and not answering questions completely. Per patients' husband,  patient was started on methadone 9 days ago, 3 x QD. Physician notified of patients' condition.

## 2022-04-13 NOTE — ED Provider Notes (Addendum)
[facility] EMERGENCY DEPARTMENT Provider Note   CSN: [id number] Arrival date & time: 04/13/22  1925     History  Chief Complaint  Patient presents with   Fatigue    Becky Wolfe is a 61 y.o. female.  Pt c/o generalized weakness, fatigue, in the past few days. Family member checked her pulse ox while resting and was 89%. Pt denies sob. Denies chest pain or discomfort. No cough or uri symptoms. No fever or chills. No increased swelling or edema. No leg pain or swelling. Inquired about new meds - pt/family indicates only new med is methadone 5 mg tid, started ~ 1.5 weeks ago. No focal or unilateral numbness or weakness. No change in speech or vision. No headache. Normal appetite. No nvd. No dysuria.   The history is provided by the patient, medical records and a relative.      Home Medications Prior to Admission medications   Medication Sig Start Date End Date Taking? Authorizing Provider  aspirin EC 81 MG tablet  03/16/21   [provider]  clonazePAM (KLONOPIN) 0.5 MG tablet TAKE 1 TABLET(0.5 MG) BY MOUTH THREE TIMES DAILY AS NEEDED FOR ANXIETY. 03/21/22   Donnal Moat T, PA-C  cloNIDine (CATAPRES) 0.1 MG tablet Take 0.1 mg by mouth 2 (two) times daily.    [provider]  clopidogrel (PLAVIX) 75 MG tablet Take 1 tablet (75 mg total) by mouth daily. 07/11/21   Sater, Nanine Means, MD  fluticasone (FLONASE) 50 MCG/ACT nasal spray Place into the nose. 01/14/22 01/14/23  [provider]  gabapentin (NEURONTIN) 800 MG tablet Take 800 mg by mouth 2 (two) times daily. Bid-tid 05/12/17   [provider]  oxyCODONE-acetaminophen (PERCOCET) 10-325 MG tablet Take 1 tablet by mouth every 6 (six) hours as needed. 08/14/21   [provider]  QUEtiapine (SEROQUEL) 300 MG tablet Take 1 tablet (300 mg total) by mouth at bedtime. 03/14/22   Donnal Moat T, PA-C  sertraline (ZOLOFT) 100 MG tablet TAKE 2 TABLETS(200 MG) BY MOUTH DAILY 03/14/22   Donnal Moat  T, PA-C  Tapentadol HCl (NUCYNTA) 100 MG TABS Take 100 mg by mouth every 6 (six) hours. Patient not taking: Reported on 08/29/2021    [provider]      Allergies    Etodolac, Ambien [zolpidem tartrate], and Namenda [memantine]    Review of Systems   Review of Systems  Constitutional:  Negative for chills and fever.  HENT:  Negative for sore throat.   Eyes:  Negative for visual disturbance.  Respiratory:  Negative for cough and shortness of breath.   Cardiovascular:  Negative for chest pain.  Gastrointestinal:  Negative for abdominal pain, blood in stool, diarrhea and vomiting.  Genitourinary:  Negative for dysuria and flank pain.  Musculoskeletal:  Negative for neck pain and neck stiffness.  Skin:  Negative for rash.  Neurological:  Negative for syncope, speech difficulty, numbness and headaches.  Hematological:  Does not bruise/bleed easily.  Psychiatric/Behavioral:  Negative for confusion.    Physical Exam Updated Vital Signs BP (!) 147/75 (BP Location: Left Arm)   Pulse 92   Temp 99.9 F (37.7 C) (Oral)   Resp 20   Ht 1.575 m ('5\' 2"'$ )   Wt 63 kg   LMP 10/16/2011   SpO2 93%   BMI 25.42 kg/m  Physical Exam Vitals and nursing note reviewed.  Constitutional:      Appearance: Normal appearance. She is well-developed.  HENT:     Head:  Atraumatic.     Nose: Nose normal.     Mouth/Throat:     Mouth: Mucous membranes are moist.  Eyes:     General: No scleral icterus.    Conjunctiva/sclera: Conjunctivae normal.     Pupils: Pupils are equal, round, and reactive to light.     Comments: Pupils 3-4 mm, equal, bilateral.   Neck:     Vascular: No carotid bruit.     Trachea: No tracheal deviation.     Comments: Trachea midline, thyroid not grossly enlarged. No bruits. No neck stiffness or rigidity.  Cardiovascular:     Rate and Rhythm: Normal rate and regular rhythm.     Pulses: Normal pulses.     Heart sounds: Normal heart sounds. No murmur heard.   No friction  rub. No gallop.  Pulmonary:     Effort: Pulmonary effort is normal. No respiratory distress.     Breath sounds: Normal breath sounds.  Abdominal:     General: Bowel sounds are normal. There is no distension.     Palpations: Abdomen is soft.     Tenderness: There is no abdominal tenderness.  Genitourinary:    Comments: No cva tenderness.  Musculoskeletal:        General: No swelling or tenderness.     Cervical back: Normal range of motion and neck supple. No rigidity. No muscular tenderness.     Right lower leg: No edema.     Left lower leg: No edema.  Skin:    General: Skin is warm and dry.     Findings: No rash.  Neurological:     Mental Status: She is alert.     Comments: Alert, speech normal, no dysarthria or aphasia. Motor/sens grossly intact bil.   Psychiatric:        Mood and Affect: Mood normal.    ED Results / Procedures / Treatments   Labs (all labs ordered are listed, but only abnormal results are displayed) Results for orders placed or performed during the hospital encounter of 04/13/22  Comprehensive metabolic panel  Result Value Ref Range   Sodium 135 135 - 145 mmol/L   Potassium 3.5 3.5 - 5.1 mmol/L   Chloride 99 98 - 111 mmol/L   CO2 24 22 - 32 mmol/L   Glucose, Bld 87 70 - 99 mg/dL   BUN 9 8 - 23 mg/dL   Creatinine, Ser 0.83 0.44 - 1.00 mg/dL   Calcium 9.0 8.9 - 10.3 mg/dL   Total Protein 8.7 (H) 6.5 - 8.1 g/dL   Albumin 3.9 3.5 - 5.0 g/dL   AST 186 (H) 15 - 41 U/L   ALT 138 (H) 0 - 44 U/L   Alkaline Phosphatase 124 38 - 126 U/L   Total Bilirubin 1.6 (H) 0.3 - 1.2 mg/dL   GFR, Estimated >60 >60 mL/min   Anion gap 12 5 - 15  CBC with Differential  Result Value Ref Range   WBC 8.4 4.0 - 10.5 K/uL   RBC 3.69 (L) 3.87 - 5.11 MIL/uL   Hemoglobin 10.8 (L) 12.0 - 15.0 g/dL   HCT 31.3 (L) 36.0 - 46.0 %   MCV 84.8 80.0 - 100.0 fL   MCH 29.3 26.0 - 34.0 pg   MCHC 34.5 30.0 - 36.0 g/dL   RDW 13.2 11.5 - 15.5 %   Platelets 267 150 - 400 K/uL   nRBC 0.0  0.0 - 0.2 %   Neutrophils Relative % 77 %   Neutro Abs 6.4 1.7 -  7.7 K/uL   Lymphocytes Relative 14 %   Lymphs Abs 1.2 0.7 - 4.0 K/uL   Monocytes Relative 7 %   Monocytes Absolute 0.6 0.1 - 1.0 K/uL   Eosinophils Relative 1 %   Eosinophils Absolute 0.1 0.0 - 0.5 K/uL   Basophils Relative 0 %   Basophils Absolute 0.0 0.0 - 0.1 K/uL   Immature Granulocytes 1 %   Abs Immature Granulocytes 0.05 0.00 - 0.07 K/uL  Urinalysis, Routine w reflex microscopic Urine, Clean Catch  Result Value Ref Range   Color, Urine YELLOW YELLOW   APPearance CLEAR CLEAR   Specific Gravity, Urine 1.025 1.005 - 1.030   pH 5.5 5.0 - 8.0   Glucose, UA NEGATIVE NEGATIVE mg/dL   Hgb urine dipstick SMALL (A) NEGATIVE   Bilirubin Urine MODERATE (A) NEGATIVE   Ketones, ur 80 (A) NEGATIVE mg/dL   Protein, ur 30 (A) NEGATIVE mg/dL   Nitrite NEGATIVE NEGATIVE   Leukocytes,Ua LARGE (A) NEGATIVE  Urinalysis, Microscopic (reflex)  Result Value Ref Range   RBC / HPF 0-5 0 - 5 RBC/hpf   WBC, UA 21-50 0 - 5 WBC/hpf   Bacteria, UA MANY (A) NONE SEEN   Squamous Epithelial / LPF 0-5 0 - 5   Mucus PRESENT   POC CBG, ED  Result Value Ref Range   Glucose-Capillary 90 70 - 99 mg/dL   DG Chest Portable 1 View  Result Date: 04/13/2022 CLINICAL DATA:  Fatigue EXAM: PORTABLE CHEST 1 VIEW COMPARISON:  01/09/2020 FINDINGS: Hardware in the cervicothoracic region. No focal opacity or pleural effusion. Normal cardiac size. No pneumothorax IMPRESSION: No active disease. Electronically Signed   By: Donavan Foil M.D.   On: 04/13/2022 20:51   CT CHEST LUNG CA SCREEN LOW DOSE W/O CM  Result Date: 03/21/2022 CLINICAL DATA:  Current smoker, 34 pack-year history. EXAM: CT CHEST WITHOUT CONTRAST LOW-DOSE FOR LUNG CANCER SCREENING TECHNIQUE: Multidetector CT imaging of the chest was performed following the standard protocol without IV contrast. RADIATION DOSE REDUCTION: This exam was performed according to the departmental dose-optimization  program which includes automated exposure control, adjustment of the mA and/or kV according to patient size and/or use of iterative reconstruction technique. COMPARISON:  03/14/2019 and 01/14/2018. FINDINGS: Cardiovascular: Coronary artery calcification. Heart size normal. No pericardial effusion. Mediastinum/Nodes: No pathologically enlarged mediastinal or axillary lymph nodes. Hilar regions are difficult to evaluate without IV contrast. Esophagus is grossly unremarkable. Lungs/Pleura: Centrilobular emphysema. Mild smoking related respiratory bronchiolitis. Scarring in the lingula. Pulmonary nodules measure 3.9 mm or less in size, as before. No new or suspicious pulmonary nodules. No pleural fluid. Airway is unremarkable. Upper Abdomen: Visualized portions of the liver, gallbladder, adrenal glands, kidneys, spleen, pancreas, stomach and bowel are grossly unremarkable. Musculoskeletal: Degenerative changes in the spine. No worrisome lytic or sclerotic lesions. IMPRESSION: 1. Lung-RADS 2, benign appearance or behavior. Continue annual screening with low-dose chest CT without contrast in 12 months. 2. Coronary artery calcification. 3.  Emphysema (ICD10-J43.9). Electronically Signed   By: Lorin Picket M.D.   On: 03/21/2022 16:06     EKG EKG Interpretation  Date/Time:  Saturday April 13 2022 21:01:06 EDT Ventricular Rate:  91 PR Interval:  129 QRS Duration: 94 QT Interval:  376 QTC Calculation: 463 R Axis:   61 Text Interpretation: Sinus rhythm Nonspecific T wave abnormality Confirmed by Lajean Saver [phone]) on 04/13/2022 9:36:47 PM  Radiology DG Chest Portable 1 View  Result Date: 04/13/2022 CLINICAL DATA:  Fatigue EXAM: PORTABLE CHEST 1 VIEW  COMPARISON:  01/09/2020 FINDINGS: Hardware in the cervicothoracic region. No focal opacity or pleural effusion. Normal cardiac size. No pneumothorax IMPRESSION: No active disease. Electronically Signed   By: Donavan Foil M.D.   On: 04/13/2022 20:51     Procedures Procedures    Medications Ordered in ED Medications  sodium chloride 0.9 % bolus 1,000 mL (has no administration in time range)  cefTRIAXone (ROCEPHIN) 1 g in sodium chloride 0.9 % 100 mL IVPB (has no administration in time range)    ED Course/ Medical Decision Making/ A&P                           Medical Decision Making Problems Addressed: Acute alteration in mental status: acute illness or injury with systemic symptoms that poses a threat to life or bodily functions Acute UTI: acute illness or injury with systemic symptoms that poses a threat to life or bodily functions Elevated LFTs: acute illness or injury Generalized weakness: acute illness or injury with systemic symptoms that poses a threat to life or bodily functions Other cirrhosis of liver ([place]): chronic illness or injury Sedated due to multiple medications: acute illness or injury  Amount and/or Complexity of Data Reviewed Independent Historian: spouse    Details: hx External Data Reviewed: labs, radiology and notes. Labs: ordered. Decision-making details documented in ED Course. Radiology: ordered and independent interpretation performed. Decision-making details documented in ED Course. ECG/medicine tests: ordered and independent interpretation performed. Decision-making details documented in ED Course. Discussion of management or test interpretation with external provider(s): Hospitalists, discussed pt.   Risk Prescription drug management. Decision regarding hospitalization.   Iv ns. Continuous pulse ox and cardiac monitoring. Labs ordered/sent. Imaging ordered.   Reviewed nursing notes and prior charts for additional history. External reports reviewed. Additional history from:family member.   Cardiac monitor: sinus rhythm, rate 92.  Labs reviewed/interpreted by me - wbc normal. UA w wbc and ketones. Iv fluids given. Iv abx. Lfts mildly elevated. No abd pain or tenderness.   Xrays  reviewed/interpreted by me - no pna.   Pt does appear drowsy, falls asleep easily. Pt is noted on multiple sedating meds, and ?whether addition methadone recently playing a role.   Recent charts/imaging also reviewed as mild elev lfts noted - on recent prior u/s abd - liver cirrhosis noted, no gallstones.  Pt denies any abd pain, no abd tenderness on exam. Ammonia level added to workup.   UA c/w uti, ivf, iv antibiotics given.  Feel likely weakness is multifactorial, multiple sedating meds w recent addition methadone, cirrhosis, uti, etc. Given weakness, low pulse ox, uti w low grade fever,  etc will plan for admission.  Hospitalists consulted for admission. Discussed pt with Dr Myna Hidalgo.   Ammonia level remains pending.   Pt currently appears stable for admit/transport.          Final Clinical Impression(s) / ED Diagnoses Final diagnoses:  None    Rx / DC Orders ED Discharge Orders     None         Lajean Saver, MD 04/13/22 2350

## 2022-04-14 ENCOUNTER — Encounter (HOSPITAL_COMMUNITY): Payer: Self-pay | Admitting: Family Medicine

## 2022-04-14 DIAGNOSIS — Z7982 Long term (current) use of aspirin: Secondary | ICD-10-CM | POA: Diagnosis not present

## 2022-04-14 DIAGNOSIS — Z7902 Long term (current) use of antithrombotics/antiplatelets: Secondary | ICD-10-CM | POA: Diagnosis not present

## 2022-04-14 DIAGNOSIS — F17210 Nicotine dependence, cigarettes, uncomplicated: Secondary | ICD-10-CM | POA: Diagnosis not present

## 2022-04-14 DIAGNOSIS — Z8673 Personal history of transient ischemic attack (TIA), and cerebral infarction without residual deficits: Secondary | ICD-10-CM | POA: Diagnosis not present

## 2022-04-14 DIAGNOSIS — R911 Solitary pulmonary nodule: Secondary | ICD-10-CM | POA: Insufficient documentation

## 2022-04-14 DIAGNOSIS — D649 Anemia, unspecified: Secondary | ICD-10-CM | POA: Diagnosis present

## 2022-04-14 DIAGNOSIS — R7401 Elevation of levels of liver transaminase levels: Secondary | ICD-10-CM | POA: Diagnosis present

## 2022-04-14 DIAGNOSIS — J9601 Acute respiratory failure with hypoxia: Secondary | ICD-10-CM | POA: Diagnosis present

## 2022-04-14 DIAGNOSIS — H6592 Unspecified nonsuppurative otitis media, left ear: Secondary | ICD-10-CM

## 2022-04-14 DIAGNOSIS — H6692 Otitis media, unspecified, left ear: Secondary | ICD-10-CM | POA: Diagnosis not present

## 2022-04-14 DIAGNOSIS — N390 Urinary tract infection, site not specified: Secondary | ICD-10-CM | POA: Diagnosis not present

## 2022-04-14 DIAGNOSIS — Z86718 Personal history of other venous thrombosis and embolism: Secondary | ICD-10-CM | POA: Diagnosis not present

## 2022-04-14 DIAGNOSIS — G9340 Encephalopathy, unspecified: Secondary | ICD-10-CM | POA: Diagnosis not present

## 2022-04-14 DIAGNOSIS — R5383 Other fatigue: Secondary | ICD-10-CM | POA: Diagnosis present

## 2022-04-14 DIAGNOSIS — Z8619 Personal history of other infectious and parasitic diseases: Secondary | ICD-10-CM | POA: Diagnosis present

## 2022-04-14 DIAGNOSIS — G8929 Other chronic pain: Secondary | ICD-10-CM | POA: Diagnosis not present

## 2022-04-14 DIAGNOSIS — Z79899 Other long term (current) drug therapy: Secondary | ICD-10-CM | POA: Diagnosis not present

## 2022-04-14 LAB — COMPREHENSIVE METABOLIC PANEL
ALT: 104 U/L — ABNORMAL HIGH (ref 0–44)
AST: 121 U/L — ABNORMAL HIGH (ref 15–41)
Albumin: 3.4 g/dL — ABNORMAL LOW (ref 3.5–5.0)
Alkaline Phosphatase: 107 U/L (ref 38–126)
Anion gap: 10 (ref 5–15)
BUN: 10 mg/dL (ref 8–23)
CO2: 23 mmol/L (ref 22–32)
Calcium: 8.6 mg/dL — ABNORMAL LOW (ref 8.9–10.3)
Chloride: 108 mmol/L (ref 98–111)
Creatinine, Ser: 0.73 mg/dL (ref 0.44–1.00)
GFR, Estimated: >60 mL/min (ref >60)
Glucose, Bld: 73 mg/dL (ref 70–99)
Potassium: 4.1 mmol/L (ref 3.5–5.1)
Sodium: 141 mmol/L (ref 135–145)
Total Bilirubin: 1.4 mg/dL — ABNORMAL HIGH (ref 0.3–1.2)
Total Protein: 7.7 g/dL (ref 6.5–8.1)

## 2022-04-14 LAB — CBC
HCT: 31.8 % — ABNORMAL LOW (ref 36.0–46.0)
Hemoglobin: 10.7 g/dL — ABNORMAL LOW (ref 12.0–15.0)
MCH: 29.4 pg (ref 26.0–34.0)
MCHC: 33.6 g/dL (ref 30.0–36.0)
MCV: 87.4 fL (ref 80.0–100.0)
Platelets: 251 10*3/uL (ref 150–400)
RBC: 3.64 MIL/uL — ABNORMAL LOW (ref 3.87–5.11)
RDW: 13.2 % (ref 11.5–15.5)
WBC: 5.2 10*3/uL (ref 4.0–10.5)
nRBC: 0 % (ref 0.0–0.2)

## 2022-04-14 LAB — PHOSPHORUS: Phosphorus: 3.3 mg/dL (ref 2.5–4.6)

## 2022-04-14 LAB — MAGNESIUM: Magnesium: 2.4 mg/dL (ref 1.7–2.4)

## 2022-04-14 LAB — AMMONIA: Ammonia: 25 umol/L (ref 9–35)

## 2022-04-14 LAB — LACTIC ACID, PLASMA: Lactic Acid, Venous: 0.6 mmol/L (ref 0.5–1.9)

## 2022-04-14 MED ORDER — ACETAMINOPHEN 325 MG PO TABS
650.0000 mg | ORAL_TABLET | Freq: Four times a day (QID) | ORAL | Status: DC | PRN
  Administered 2022-04-14: 650 mg via ORAL
  Filled 2022-04-14: qty 2

## 2022-04-14 MED ORDER — SODIUM CHLORIDE 0.9 % IV SOLN
1.0000 g | INTRAVENOUS | Status: DC
  Administered 2022-04-14: 1 g via INTRAVENOUS
  Filled 2022-04-14: qty 10

## 2022-04-14 MED ORDER — ONDANSETRON HCL 4 MG/2ML IJ SOLN: 4.0000 mg | Freq: Four times a day (QID) | INTRAMUSCULAR | Status: DC | PRN

## 2022-04-14 MED ORDER — SERTRALINE HCL 100 MG PO TABS
200.0000 mg | ORAL_TABLET | Freq: Every morning | ORAL | Status: DC
  Administered 2022-04-14 – 2022-04-15 (×2): 200 mg via ORAL
  Filled 2022-04-14 (×2): qty 2

## 2022-04-14 MED ORDER — POTASSIUM CHLORIDE IN NACL 20-0.9 MEQ/L-% IV SOLN
INTRAVENOUS | Status: DC
  Filled 2022-04-14 (×2): qty 1000

## 2022-04-14 MED ORDER — NALOXONE HCL 4 MG/0.1ML NA LIQD: 0.4000 mg | Freq: Once | NASAL | Status: DC | PRN

## 2022-04-14 MED ORDER — CLONIDINE HCL 0.1 MG PO TABS
0.1000 mg | ORAL_TABLET | Freq: Two times a day (BID) | ORAL | Status: DC
  Administered 2022-04-14 – 2022-04-15 (×3): 0.1 mg via ORAL
  Filled 2022-04-14 (×3): qty 1

## 2022-04-14 MED ORDER — CLOPIDOGREL BISULFATE 75 MG PO TABS
75.0000 mg | ORAL_TABLET | Freq: Every morning | ORAL | Status: DC
  Administered 2022-04-14 – 2022-04-15 (×2): 75 mg via ORAL
  Filled 2022-04-14 (×2): qty 1

## 2022-04-14 MED ORDER — FENTANYL CITRATE PF 50 MCG/ML IJ SOSY
50.0000 ug | PREFILLED_SYRINGE | Freq: Once | INTRAMUSCULAR | Status: AC
  Administered 2022-04-14: 50 ug via INTRAVENOUS
  Filled 2022-04-14: qty 1

## 2022-04-14 MED ORDER — POLYVINYL ALCOHOL 1.4 % OP SOLN
1.0000 [drp] | Freq: Every day | OPHTHALMIC | Status: DC | PRN
Start: 2022-04-14 — End: 2022-04-15

## 2022-04-14 MED ORDER — CLONAZEPAM 0.5 MG PO TABS: 0.5000 mg | ORAL_TABLET | Freq: Three times a day (TID) | ORAL | Status: DC | PRN

## 2022-04-14 MED ORDER — ACETAMINOPHEN 650 MG RE SUPP: 650.0000 mg | Freq: Four times a day (QID) | RECTAL | Status: DC | PRN

## 2022-04-14 MED ORDER — GABAPENTIN 400 MG PO CAPS
800.0000 mg | ORAL_CAPSULE | Freq: Three times a day (TID) | ORAL | Status: DC
  Administered 2022-04-14 – 2022-04-15 (×3): 800 mg via ORAL
  Filled 2022-04-14 (×3): qty 2

## 2022-04-14 MED ORDER — LACTATED RINGERS IV BOLUS
1000.0000 mL | Freq: Once | INTRAVENOUS | Status: AC
  Administered 2022-04-14: 1000 mL via INTRAVENOUS

## 2022-04-14 MED ORDER — ONDANSETRON HCL 4 MG PO TABS: 4.0000 mg | ORAL_TABLET | Freq: Four times a day (QID) | ORAL | Status: DC | PRN

## 2022-04-14 MED ORDER — METHADONE HCL 10 MG PO TABS
5.0000 mg | ORAL_TABLET | Freq: Two times a day (BID) | ORAL | Status: DC
  Administered 2022-04-14 – 2022-04-15 (×3): 5 mg via ORAL
  Filled 2022-04-14 (×3): qty 1

## 2022-04-14 NOTE — ED Notes (Signed)
POX intermittently will read 88-92%, pt will be asleep and snoring is noted intermittently, oxygen via [place] @ 2lpm continued.

## 2022-04-14 NOTE — ED Notes (Signed)
Phone report provided to rec RN at [facility] 

## 2022-04-14 NOTE — ED Notes (Signed)
[facility] Transport Team at bedside 

## 2022-04-14 NOTE — Progress Notes (Signed)
Patient reported BM that was painful and bloody. Was not witnessed by staff member.

## 2022-04-14 NOTE — ED Notes (Signed)
Skin warm to touch, pulses easily palpable, capillary refill WNL, noted to mae x 4

## 2022-04-14 NOTE — Progress Notes (Signed)
[facility] admitting physician addendum:  I was able to evaluate the patient's left ear which she had been complaining of effusion.  There was edema and mild tenderness of the tragus, intertrigonal notch and antitragus.  The ear canal is also edematous showing a mostly serous and mildly purulent discharge.  There might be a small eardrum perforation on the anterior portion of the tympanic membrane.  Due to these, I will defer the use of eardrops.  She is already on ceftriaxone.  She is a scheduled to see ENT on 04/30/2022.  She may need tympanostomy with tube placement by ENT.  She should avoid swimming and should cover her ear when bathing.  Tennis Must, MD.

## 2022-04-14 NOTE — ED Notes (Signed)
In to perform rounding on client, resting quietly with eyes closed, snoring intermittently. Sr x 2 up, call bell at easy reach, family in room. Client is very sleepy, did open eyes to light verbal stimuli but does easily fall back to sleep.

## 2022-04-14 NOTE — ED Notes (Signed)
Phone handoff report given to [facility] with [employer]

## 2022-04-14 NOTE — ED Notes (Signed)
Will cont to monitor/ observe while awaiting for room assignment at [facility]

## 2022-04-14 NOTE — H&P (Signed)
History and Physical    Patient: Becky Wolfe TWS:[id number] DOB: 09/05/1961 DOA: 04/13/2022 DOS: the patient was seen and examined on 04/14/2022 PCP: Mayra Neer, MD  Patient coming from: Home  Chief Complaint:  Chief Complaint  Patient presents with   Fatigue   HPI: Becky Wolfe is a 61 y.o. female with medical history significant of anxiety, history of panic attacks, depression, history of blood transfusion after childbirth, history of bronchitis, chronic neck pain, history of protruded cervical disc with cervical myelopathy, history of cervical spine fusion, constipation, history of DVT, migraine headaches, history of heart murmur, hepatitis C after receiving a blood transfusion at age 76 with successful treatment, multiple sclerosis, history of pneumonia, history of mild emphysema, reflex sympathetic dystrophic of the legs, history of TIA with transient left-sided weakness who presented to the emergency department with somnolence, fatigue and an oxygen saturation of 88% at home associated with an abnormal respiratory pattern.  She does not remember much of what happened yesterday.  In recent weeks, she was switched from [place] to methadone for pain management as she is unable to afford the $500 plus co-pay every month.  She was initially started on methadone 5 mg p.o. twice daily for 7 days, then earlier in the week was increased to 5 mg p.o. 3 times daily.  She last took her Seroquel 300 mg p.o. at bedtime about a week or so ago.  He denied fever, chills, rhinorrhea, sore throat, wheezing or hemoptysis.  No chest pain, palpitations, diaphoresis, PND, orthopnea or recent pitting edema of the lower extremities.  Sometimes her ankles may swell if she is standing for a long time during the day.  No major changes in appetite.  She sometimes get nauseous, but no abdominal pain, emesis, diarrhea, melena or hematochezia.  She gets frequent constipation.  She has had frequency and a couple episodes  of incontinence since Friday, earlier in the week she experienced RUQ or lower right chest wall pain.  There has not been dysuria or hematuria.  No polyuria, polydipsia, polyphagia or blurred vision.   ED course: Initial vital signs were temperature 99.9 F, pulse 92, respiration 20, BP 147/75 mmHg O2 sat is 93% on room air.  The patient received 1000 mL of normal saline bolus and 1 g of ceftriaxone IVPB.  Lab work: Urinalysis with small hemoglobinuria, moderate bilirubinuria, ketonuria of 80 and proteinuria 30 mg/dL with large leukocyte esterase, 21-50 WBC per hpf and many bacteria on microscopic examination.  CBC showed a white count 8.4, Mono 10.8 g/dL and platelets 267.  Lactic acid and ammonia level were normal.  CMP showed normal electrolytes, glucose and renal function.  Total protein is 8.7, albumin 3.9 g/dL.  AST 186, ALT 138 and alkaline phosphatase 124 units/L.  Total bilirubin was 1.6 mg/dL.  Imaging: One-view portable chest radiograph show hardware in the cervical thoracic region with no focal opacity or pleural effusion.  Cardiac size is normal.  No pneumothorax or any other active disease seen.   Review of Systems: As mentioned in the history of present illness. All other systems reviewed and are negative. Past Medical History:  Diagnosis Date   Anxiety 06-16-13   hx. panic attacks, none recent   Blood transfusion without reported diagnosis    20 yrs ago after childbirth   Bronchitis    hx of   Chronic neck pain    Constipation    Depression    DVT (deep venous thrombosis) ([place]) 2015   rt. leg  Headache(784.0)    migraines-has decreased   Heart murmur    Dr. Etter Sjogren   Hepatitis C    dx. Hep. C(s/p transfusion age 13) low Hgb  .-multiple transfusions. Treated successfully   Multiple sclerosis ([place])    dx. -10yr ago, then med stopped-due to liver enzymes changes,occ. periods of weakness in arms"d.  [facility] MS[facility] things"    Pneumonia    hx of , ? mild emphysema on CT scan 5'14, multiple pulmonary nodules   RSD (reflex sympathetic dystrophy) 06-16-13   legs   RSD (reflex sympathetic dystrophy) 2009   TIA (transient ischemic attack)    hx of (weakness left side)-none in 1 yr   Past Surgical History:  Procedure Laterality Date   ABDOMINAL HYSTERECTOMY     ANAL RECTAL MANOMETRY N/A 04/11/2015   Procedure: ANO RECTAL MANOMETRY;  Surgeon: WArta Silence MD;  Location: [facility];  Service: Endoscopy;  Laterality: N/A;   ANAL RECTAL MANOMETRY N/A 05/21/2017   Procedure: ANO RECTAL MANOMETRY;  Surgeon: OArta Silence MD;  Location: [facility];  Service: Endoscopy;  Laterality: N/A;   ANKLE SURGERY Right    no retained hardware   ANTERIOR CERVICAL DECOMP/DISCECTOMY FUSION  09/21/2012   Procedure: ANTERIOR CERVICAL DECOMPRESSION/DISCECTOMY FUSION 2 LEVELS;  Surgeon: JOtilio Connors MD;  [facility];  Service: Neurosurgery;  Laterality: N/A;  Cervical five-six, cervical seven-thoracic one Anterior cervical decompression/diskectomy/fusion/Lifenet bone/trestle plate/Prior Cervical six--seven Anterior cervical fusion/Removing trestle plate   ANTERIOR CERVICAL DECOMP/DISCECTOMY FUSION N/A 08/05/2018   Procedure: Cervical four-five Anterior cervical decompression/discectomy/fusion;  Surgeon: OJudith Part MD;  [place] [facility]  Service: Neurosurgery;  Laterality: N/A;   APPENDECTOMY     CESAREAN SECTION     twice   COLONOSCOPY     ESOPHAGOGASTRODUODENOSCOPY (EGD) WITH PROPOFOL N/A 06/30/2013   Procedure: ESOPHAGOGASTRODUODENOSCOPY (EGD) WITH PROPOFOL;  Surgeon: WArta Silence MD;  Location: [facility];  Service: Endoscopy;  Laterality: N/A;   NECK SURGERY     WEDGE RESECTION     left ovary with cyst removed ,hx. multiple cysts removed prior   Social History:  reports that she has been smoking cigarettes. She has a 4.32 pack-year smoking history. She has never used smokeless tobacco. She reports that  she does not drink alcohol and does not use drugs.  Allergies  Allergen Reactions   Etodolac Swelling and Other (See Comments)    Facial swelling    Ambien [Zolpidem Tartrate] Other (See Comments)    Headaches, memory loss    Namenda [Memantine] Other (See Comments)    Headaches     Family History  Problem Relation Age of Onset   Heart attack Mother 648      with CABG   Heart disease Mother    Hypertension Mother    Migraines Mother    Heart attack Father    Heart disease Father    Diabetes Mellitus II Father    Hypertension Father    Lupus Sister    Depression Sister    Migraines Sister    Renal Disease Brother    Diabetes Sister    Hypertension Sister    Migraines Sister    Migraines Sister    Healthy Sister    Healthy Sister    Healthy Sister    Healthy Brother    Dementia Maternal Grandfather    Colon cancer Maternal Grandmother    Heart disease Maternal Grandmother    Hypertension Maternal  Grandmother    Healthy Son    Migraines Son     Prior to Admission medications   Medication Sig Start Date End Date Taking? Authorizing Provider  aspirin EC 81 MG tablet  03/16/21   [provider]  clonazePAM (KLONOPIN) 0.5 MG tablet TAKE 1 TABLET(0.5 MG) BY MOUTH THREE TIMES DAILY AS NEEDED FOR ANXIETY. 03/21/22   Donnal Moat T, PA-C  cloNIDine (CATAPRES) 0.1 MG tablet Take 0.1 mg by mouth 2 (two) times daily.    [provider]  clopidogrel (PLAVIX) 75 MG tablet Take 1 tablet (75 mg total) by mouth daily. 07/11/21   Sater, Nanine Means, MD  fluticasone (FLONASE) 50 MCG/ACT nasal spray Place into the nose. 01/14/22 01/14/23  [provider]  gabapentin (NEURONTIN) 800 MG tablet Take 800 mg by mouth 2 (two) times daily. Bid-tid 05/12/17   [provider]  methadone (DOLOPHINE) 5 MG tablet Take 5 mg by mouth. 04/09/22   [provider]  oxyCODONE-acetaminophen (PERCOCET) 10-325 MG tablet Take 1 tablet by mouth every 6 (six) hours as needed.  08/14/21   [provider]  QUEtiapine (SEROQUEL) 300 MG tablet Take 1 tablet (300 mg total) by mouth at bedtime. 03/14/22   Donnal Moat T, PA-C  sertraline (ZOLOFT) 100 MG tablet TAKE 2 TABLETS(200 MG) BY MOUTH DAILY 03/14/22   Donnal Moat T, PA-C  Tapentadol HCl (NUCYNTA) 100 MG TABS Take 100 mg by mouth every 6 (six) hours. Patient not taking: Reported on 08/29/2021    [provider]    Physical Exam: Vitals:   04/14/22 0430 04/14/22 0714 04/14/22 0800 04/14/22 1008  BP: 112/68 (!) 116/57 (!) 109/50 126/75  Pulse: 75 76 75 89  Resp: (!) 22 18 (!) 23 18  Temp: 99.1 F (37.3 C) 98.1 F (36.7 C)  97.8 F (36.6 C)  TempSrc: Oral Oral  Oral  SpO2: 94% 95% 100% 97%  Weight:      Height:       Physical Exam Vitals and nursing note reviewed.  Constitutional:      Appearance: Normal appearance.  HENT:     Head: Normocephalic.     Mouth/Throat:     Mouth: Mucous membranes are dry.  Eyes:     General: No scleral icterus.    Pupils: Pupils are equal, round, and reactive to light.  Neck:     Vascular: No JVD.  Cardiovascular:     Rate and Rhythm: Normal rate and regular rhythm.     Heart sounds: S1 normal and S2 normal.  Pulmonary:     Effort: Pulmonary effort is normal. No respiratory distress.     Breath sounds: Normal breath sounds. No wheezing, rhonchi or rales.  Abdominal:     General: Bowel sounds are normal. There is no distension.     Palpations: Abdomen is soft.     Tenderness: There is no abdominal tenderness. There is no right CVA tenderness, left CVA tenderness or guarding.  Musculoskeletal:     Cervical back: Neck supple.     Right lower leg: No edema.     Left lower leg: No edema.  Skin:    General: Skin is warm and dry.  Neurological:     Mental Status: She is alert and oriented to person, place, and time.  Psychiatric:        Mood and Affect: Mood normal.        Behavior: Behavior normal.    Data Reviewed:  Results are pending, will  review when available.  Assessment and Plan: Principal Problem:   Acute encephalopathy Associated with labored breathing:   Acute respiratory failure with hypoxia (HCC) In the setting of   Acute UTI (urinary tract infection) And possible medication side effects. Admit to MedSurg/observation. Continue IV fluids. Continue ceftriaxone 1 g every 24 hours.   Follow-up urine culture and sensitivity Follow CBC and CMP in a.m. Will decrease methadone dose back to 5 mg p.o. twice daily.  Active Problems:   Chronic pain disorder In the setting of:   History of fusion of cervical spine   Protruded cervical disc Continue gabapentin 800 mg p.o. 3 times daily. Decreased methadone to twice daily.    Middle ear effusion, left Scheduled to see ENT later this month. 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    Tobacco abuse Tobacco cessation advised. Does not think she needs nicotine replacement.    Normocytic anemia Hemoglobin was 12.4 g/dL year ago. No complaints of melena or hematochezia. Monitor hematocrit and hemoglobin.    Transaminitis Also with history of   History of hepatitis C Volume depletion? Recheck and monitor LFTs.      Advance Care Planning:   Code Status: Full Code   Consults:   Family Communication:   Severity of Illness: The appropriate patient status for this patient is OBSERVATION. Observation status is judged to be reasonable and necessary in order to provide the required intensity of service to ensure the patient's safety. The patient's presenting symptoms, physical exam findings, and initial radiographic and laboratory data in the context of their medical condition is felt to place them at decreased risk for further clinical deterioration. Furthermore, it is anticipated that the patient will be medically stable for discharge from the hospital within 2 midnights of admission.   Author: Reubin Milan, MD 04/14/2022 10:27 AM  For on call review  [email or web address].   This document was prepared using Dragon voice recognition software may contain some unintended transcription errors.

## 2022-04-15 DIAGNOSIS — G9340 Encephalopathy, unspecified: Secondary | ICD-10-CM | POA: Diagnosis not present

## 2022-04-15 LAB — CBC
HCT: 28.7 % — ABNORMAL LOW (ref 36.0–46.0)
Hemoglobin: 9.6 g/dL — ABNORMAL LOW (ref 12.0–15.0)
MCH: 29.1 pg (ref 26.0–34.0)
MCHC: 33.4 g/dL (ref 30.0–36.0)
MCV: 87 fL (ref 80.0–100.0)
Platelets: 249 10*3/uL (ref 150–400)
RBC: 3.3 MIL/uL — ABNORMAL LOW (ref 3.87–5.11)
RDW: 13.2 % (ref 11.5–15.5)
WBC: 5.8 10*3/uL (ref 4.0–10.5)
nRBC: 0 % (ref 0.0–0.2)

## 2022-04-15 LAB — COMPREHENSIVE METABOLIC PANEL
ALT: 137 U/L — ABNORMAL HIGH (ref 0–44)
AST: 190 U/L — ABNORMAL HIGH (ref 15–41)
Albumin: 3 g/dL — ABNORMAL LOW (ref 3.5–5.0)
Alkaline Phosphatase: 114 U/L (ref 38–126)
Anion gap: 6 (ref 5–15)
BUN: 8 mg/dL (ref 8–23)
CO2: 25 mmol/L (ref 22–32)
Calcium: 8.2 mg/dL — ABNORMAL LOW (ref 8.9–10.3)
Chloride: 110 mmol/L (ref 98–111)
Creatinine, Ser: 0.67 mg/dL (ref 0.44–1.00)
GFR, Estimated: >60 mL/min (ref >60)
Glucose, Bld: 112 mg/dL — ABNORMAL HIGH (ref 70–99)
Potassium: 3.7 mmol/L (ref 3.5–5.1)
Sodium: 141 mmol/L (ref 135–145)
Total Bilirubin: 0.8 mg/dL (ref 0.3–1.2)
Total Protein: 6.9 g/dL (ref 6.5–8.1)

## 2022-04-15 LAB — URINE CULTURE

## 2022-04-15 LAB — HIV ANTIBODY (ROUTINE TESTING W REFLEX): HIV Screen 4th Generation wRfx: NONREACTIVE

## 2022-04-15 MED ORDER — QUETIAPINE FUMARATE 300 MG PO TABS: 300.0000 mg | ORAL_TABLET | Freq: Every evening | ORAL | Status: DC | PRN

## 2022-04-15 MED ORDER — AMOXICILLIN-POT CLAVULANATE 875-125 MG PO TABS
1.0000 | ORAL_TABLET | Freq: Two times a day (BID) | ORAL | Status: DC
  Administered 2022-04-15: 1 via ORAL
  Filled 2022-04-15: qty 1

## 2022-04-15 MED ORDER — METHADONE HCL 5 MG PO TABS: 5.0000 mg | ORAL_TABLET | Freq: Two times a day (BID) | ORAL | 0 refills | Status: DC

## 2022-04-15 MED ORDER — AMOXICILLIN-POT CLAVULANATE 875-125 MG PO TABS: 1.0000 | ORAL_TABLET | Freq: Two times a day (BID) | ORAL | 0 refills | Status: DC

## 2022-04-15 NOTE — Progress Notes (Signed)
Transition of Care [facility]) Screening Note  Patient Details  Name: [name] Date of Birth: 1961/05/05  Transition of Care [facility]) CM/SW Contact:    Sherie Don, LCSW Phone Number: 04/15/2022, 9:20 AM  Transition of Care Department [facility]) has reviewed patient and no TOC needs have been identified at this time. We will continue to monitor patient advancement through interdisciplinary progression rounds. If new patient transition needs arise, please place a TOC consult.

## 2022-04-15 NOTE — Discharge Summary (Signed)
Physician Discharge Summary  Becky Wolfe JXB:[id number] DOB: 09-26-1961 DOA: 04/13/2022  PCP: Mayra Neer, MD  Admit date: 04/13/2022 Discharge date: 04/15/2022  Admitted From: home Discharge disposition: home   Recommendations for Outpatient Follow-Up:   Close follow up with ENT Monitor sedating medications PRN EKG LFTs in 1-2 weeks   Discharge Diagnosis:   Principal Problem:   Acute encephalopathy Active Problems:   Chronic pain disorder   Tobacco abuse   History of fusion of cervical spine   Protruded cervical disc   Normocytic anemia   Transaminitis   Acute UTI (urinary tract infection)   History of hepatitis C   Acute respiratory failure with hypoxia (HCC)   Middle ear effusion, left    Discharge Condition: Improved.  Diet recommendation: Low sodium, heart healthy  Wound care: None.  Code status: Full.   History of Present Illness:   Becky Wolfe is a 62 y.o. female with medical history significant of anxiety, history of panic attacks, depression, history of blood transfusion after childbirth, history of bronchitis, chronic neck pain, history of protruded cervical disc with cervical myelopathy, history of cervical spine fusion, constipation, history of DVT, migraine headaches, history of heart murmur, hepatitis C after receiving a blood transfusion at age 51 with successful treatment, multiple sclerosis, history of pneumonia, history of mild emphysema, reflex sympathetic dystrophic of the legs, history of TIA with transient left-sided weakness who presented to the emergency department with somnolence, fatigue and an oxygen saturation of 88% at home associated with an abnormal respiratory pattern.  She does not remember much of what happened yesterday.  In recent weeks, she was switched from [place] to methadone for pain management as she is unable to afford the $500 plus co-pay every month.  She was initially started on methadone 5 mg p.o. twice daily  for 7 days, then earlier in the week was increased to 5 mg p.o. 3 times daily.  She last took her Seroquel 300 mg p.o. at bedtime about a week or so ago.  He denied fever, chills, rhinorrhea, sore throat, wheezing or hemoptysis.  No chest pain, palpitations, diaphoresis, PND, orthopnea or recent pitting edema of the lower extremities.  Sometimes her ankles may swell if she is standing for a long time during the day.  No major changes in appetite.  She sometimes get nauseous, but no abdominal pain, emesis, diarrhea, melena or hematochezia.  She gets frequent constipation.  She has had frequency and a couple episodes of incontinence since Friday, earlier in the week she experienced RUQ or lower right chest wall pain.  There has not been dysuria or hematuria.  No polyuria, polydipsia, polyphagia or blurred vision.   Hospital Course by Problem:    Acute encephalopathy Associated with labored breathing:   Acute respiratory failure with hypoxia (HCC) In the setting of   Acute UTI (urinary tract infection) and polypharmacy -decrease methadone dose back to 5 mg p.o. twice daily. -treat UTI despite culture results-- the abx would also treat any issues with her ear     Chronic pain disorder In the setting of:   History of fusion of cervical spine   Protruded cervical disc Continue gabapentin 800 mg p.o. 3 times daily. Decreased methadone to twice daily.     Middle ear effusion, left Scheduled to see ENT later this month.      Tobacco abuse Tobacco cessation advised.     Normocytic anemia Hemoglobin was 12.4 g/dL year ago. -follow outpatient  Transaminitis Also with history of   History of hepatitis C -follow outpatient    Discussed with husband and he feels she is back to her baseline mentally   Medical Consultants:      Discharge Exam:   Vitals:   04/14/22 2048 04/15/22 0620  BP: (!) 154/78 139/70  Pulse: 87 66  Resp: 16 18  Temp: 100.1 F (37.8 C) 98.4 F (36.9 C)   SpO2: 97% 100%   Vitals:   04/14/22 0800 04/14/22 1008 04/14/22 2048 04/15/22 0620  BP: (!) 109/50 126/75 (!) 154/78 139/70  Pulse: 75 89 87 66  Resp: (!) _0 Temp:  97.8 F (36.6 C) 100.1 F (37.8 C) 98.4 F (36.9 C)  TempSrc:  Oral Oral Oral  SpO2: 100% 97% 97% 100%  Weight:      Height:        General exam: Appears calm and comfortable.     The results of significant diagnostics from this hospitalization (including imaging, microbiology, ancillary and laboratory) are listed below for reference.     Procedures and Diagnostic Studies:   DG Chest Portable 1 View  Result Date: 04/13/2022 CLINICAL DATA:  Fatigue EXAM: PORTABLE CHEST 1 VIEW COMPARISON:  01/09/2020 FINDINGS: Hardware in the cervicothoracic region. No focal opacity or pleural effusion. Normal cardiac size. No pneumothorax IMPRESSION: No active disease. Electronically Signed   By: Donavan Foil M.D.   On: 04/13/2022 20:51     Labs:   Basic Metabolic Panel: Recent Labs  Lab 04/13/22 1953 04/14/22 1102 04/15/22 0342  NA 135 141 141  K 3.5 4.1 3.7  CL 99 108 110  CO2 _1 GLUCOSE 87 73 112*  BUN _2 CREATININE 0.83 0.73 0.67  CALCIUM 9.0 8.6* 8.2*  MG  --  2.4  --   PHOS  --  3.3  --    GFR Estimated Creatinine Clearance: 64.5 mL/min (by C-G formula based on SCr of 0.67 mg/dL). Liver Function Tests: Recent Labs  Lab 04/13/22 1953 04/14/22 1102 04/15/22 0342  AST 186* 121* 190*  ALT 138* 104* 137*  ALKPHOS 124 107 114  BILITOT 1.6* 1.4* 0.8  PROT 8.7* 7.7 6.9  ALBUMIN 3.9 3.4* 3.0*   No results for input(s): LIPASE, AMYLASE in the last 168 hours. Recent Labs  Lab 04/13/22 2338  AMMONIA 25   Coagulation profile No results for input(s): INR, PROTIME in the last 168 hours.  CBC: Recent Labs  Lab 04/13/22 1953 04/14/22 1102 04/15/22 0342  WBC 8.4 5.2 5.8  NEUTROABS 6.4  --   --   HGB 10.8* 10.7* 9.6*  HCT 31.3* 31.8* 28.7*  MCV 84.8 87.4 87.0  PLT 267 251 249    Cardiac Enzymes: No results for input(s): CKTOTAL, CKMB, CKMBINDEX, TROPONINI in the last 168 hours. BNP: Invalid input(s): POCBNP CBG: Recent Labs  Lab 04/13/22 1948  GLUCAP 90   D-Dimer No results for input(s): DDIMER in the last 72 hours. Hgb A1c No results for input(s): HGBA1C in the last 72 hours. Lipid Profile No results for input(s): CHOL, HDL, LDLCALC, TRIG, CHOLHDL, LDLDIRECT in the last 72 hours. Thyroid function studies No results for input(s): TSH, T4TOTAL, T3FREE, THYROIDAB in the last 72 hours.  Invalid input(s): FREET3 Anemia work up No results for input(s): VITAMINB12, FOLATE, FERRITIN, TIBC, IRON, RETICCTPCT in the last 72 hours. Microbiology Recent Results (from the past 240 hour(s))  Urine Culture     Status: Abnormal  Collection Time: 04/13/22  8:07 PM   Specimen: Urine, Clean Catch  Result Value Ref Range Status   Specimen Description   Final    URINE, CLEAN CATCH Performed at [facility] [place], [address] [place], [place] [address]    Special Requests   Final    NONE Performed at [facility] [place], [address] [place], [place] [address]    Culture MULTIPLE SPECIES PRESENT, SUGGEST RECOLLECTION (A)  Final   Report Status 04/15/2022 FINAL  Final     Discharge Instructions:   Discharge Instructions     Diet - low sodium heart healthy   Complete by: As directed    Increase activity slowly   Complete by: As directed       Allergies as of 04/15/2022       Reactions   Etodolac Swelling, Other (See Comments)   Facial swelling   Ambien [zolpidem Tartrate] Other (See Comments)   Headaches, memory loss   Namenda [memantine] Other (See Comments)   Headaches         Medication List     TAKE these medications    amoxicillin-clavulanate 875-125 MG tablet Commonly known as: AUGMENTIN Take 1 tablet by mouth every 12 (twelve) hours.   Artificial Tears 1.4 % ophthalmic solution Generic drug: polyvinyl alcohol Place  1 drop into both eyes daily as needed for dry eyes.   clonazePAM 0.5 MG tablet Commonly known as: KLONOPIN TAKE 1 TABLET(0.5 MG) BY MOUTH THREE TIMES DAILY AS NEEDED FOR ANXIETY. What changed:  how much to take how to take this when to take this reasons to take this additional instructions   cloNIDine 0.1 MG tablet Commonly known as: CATAPRES Take 0.1 mg by mouth 2 (two) times daily.   clopidogrel 75 MG tablet Commonly known as: PLAVIX Take 1 tablet (75 mg total) by mouth daily. What changed: when to take this   fluticasone 50 MCG/ACT nasal spray Commonly known as: FLONASE Place 1 spray into both nostrils at bedtime as needed for allergies or rhinitis.   gabapentin 800 MG tablet Commonly known as: NEURONTIN Take 800 mg by mouth 3 (three) times daily.   methadone 5 MG tablet Commonly known as: DOLOPHINE Take 1 tablet (5 mg total) by mouth every 12 (twelve) hours. What changed: when to take this   naloxone 4 MG/0.1ML Liqd nasal spray kit Commonly known as: NARCAN Place 0.4 mg into the nose once as needed (opioid overdose).   QUEtiapine 300 MG tablet Commonly known as: SEROquel Take 1 tablet (300 mg total) by mouth at bedtime as needed (sleep).   sertraline 100 MG tablet Commonly known as: ZOLOFT TAKE 2 TABLETS(200 MG) BY MOUTH DAILY What changed:  how much to take how to take this when to take this additional instructions        Follow-up Information     Shaw, Kimberlee, MD Follow up in 1 week(s).   Specialty: Family Medicine Contact information: [address] [place] [address] [phone]         Croitoru, Mihai, MD .   Specialty: Cardiology Contact information: [address] [place]  [address] [phone]                  Time coordinating discharge: 35 min  Signed:   U  DO  [facility] 04/15/2022, 1:56 PM      

## 2022-04-15 NOTE — Plan of Care (Signed)
Pt ready to DC home with husband 

## 2022-04-15 NOTE — Discharge Instructions (Signed)
Sending you on an antibiotic that will treat both a UTI and a possible ear infection

## 2022-04-23 DIAGNOSIS — G90523 Complex regional pain syndrome I of lower limb, bilateral: Secondary | ICD-10-CM | POA: Diagnosis not present

## 2022-04-23 DIAGNOSIS — Z79899 Other long term (current) drug therapy: Secondary | ICD-10-CM | POA: Diagnosis not present

## 2022-04-23 DIAGNOSIS — M5412 Radiculopathy, cervical region: Secondary | ICD-10-CM | POA: Diagnosis not present

## 2022-04-23 DIAGNOSIS — G894 Chronic pain syndrome: Secondary | ICD-10-CM | POA: Diagnosis not present

## 2022-05-03 DIAGNOSIS — I10 Essential (primary) hypertension: Secondary | ICD-10-CM | POA: Diagnosis not present

## 2022-05-03 DIAGNOSIS — D649 Anemia, unspecified: Secondary | ICD-10-CM | POA: Diagnosis not present

## 2022-05-03 DIAGNOSIS — G4700 Insomnia, unspecified: Secondary | ICD-10-CM | POA: Diagnosis not present

## 2022-05-03 DIAGNOSIS — K7460 Unspecified cirrhosis of liver: Secondary | ICD-10-CM | POA: Diagnosis not present

## 2022-05-03 DIAGNOSIS — Z720 Tobacco use: Secondary | ICD-10-CM | POA: Diagnosis not present

## 2022-05-03 DIAGNOSIS — G9050 Complex regional pain syndrome I, unspecified: Secondary | ICD-10-CM | POA: Diagnosis not present

## 2022-05-03 DIAGNOSIS — Z79899 Other long term (current) drug therapy: Secondary | ICD-10-CM | POA: Diagnosis not present

## 2022-05-22 DIAGNOSIS — G90523 Complex regional pain syndrome I of lower limb, bilateral: Secondary | ICD-10-CM | POA: Diagnosis not present

## 2022-05-22 DIAGNOSIS — Z79899 Other long term (current) drug therapy: Secondary | ICD-10-CM | POA: Diagnosis not present

## 2022-05-22 DIAGNOSIS — M961 Postlaminectomy syndrome, not elsewhere classified: Secondary | ICD-10-CM | POA: Diagnosis not present

## 2022-06-06 ENCOUNTER — Ambulatory Visit: Payer: Medicare Other | Admitting: Neurology

## 2022-06-06 ENCOUNTER — Telehealth: Payer: Self-pay | Admitting: Neurology

## 2022-06-06 ENCOUNTER — Encounter: Payer: Self-pay | Admitting: Neurology

## 2022-06-06 VITALS — BP 144/76 | HR 76 | Ht 62.0 in | Wt 151.0 lb

## 2022-06-06 DIAGNOSIS — R404 Transient alteration of awareness: Secondary | ICD-10-CM

## 2022-06-06 DIAGNOSIS — G90521 Complex regional pain syndrome I of right lower limb: Secondary | ICD-10-CM

## 2022-06-06 DIAGNOSIS — G9340 Encephalopathy, unspecified: Secondary | ICD-10-CM

## 2022-06-06 DIAGNOSIS — R748 Abnormal levels of other serum enzymes: Secondary | ICD-10-CM

## 2022-06-06 NOTE — Telephone Encounter (Signed)
[employer] medicare NPR sent to GI 
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
## 2022-06-07 LAB — AMMONIA: Ammonia: 41 ug/dL (ref 34–178)

## 2022-06-07 LAB — COMPREHENSIVE METABOLIC PANEL
ALT: 19 IU/L (ref 0–32)
AST: 29 IU/L (ref 0–40)
Albumin/Globulin Ratio: 1.7 (ref 1.2–2.2)
Albumin: 4.4 g/dL (ref 3.9–4.9)
Alkaline Phosphatase: 97 IU/L (ref 44–121)
BUN/Creatinine Ratio: 18 (ref 12–28)
BUN: 15 mg/dL (ref 8–27)
Bilirubin Total: 0.3 mg/dL (ref 0.0–1.2)
CO2: 22 mmol/L (ref 20–29)
Calcium: 9.2 mg/dL (ref 8.7–10.3)
Chloride: 102 mmol/L (ref 96–106)
Creatinine, Ser: 0.85 mg/dL (ref 0.57–1.00)
Globulin, Total: 2.6 g/dL (ref 1.5–4.5)
Glucose: 87 mg/dL (ref 70–99)
Potassium: 4.9 mmol/L (ref 3.5–5.2)
Sodium: 141 mmol/L (ref 134–144)
Total Protein: 7 g/dL (ref 6.0–8.5)
eGFR: 77 mL/min/{1.73_m2} (ref >59)

## 2022-06-10 ENCOUNTER — Telehealth: Payer: Self-pay | Admitting: *Deleted

## 2022-06-10 NOTE — Telephone Encounter (Signed)
-----   Message from Britt Bottom, MD sent at 06/08/2022 11:15 AM EDT ----- Please let the patient know that the lab work is normal now.

## 2022-06-10 NOTE — Telephone Encounter (Signed)
Tried calling [phone]. Phone kept ringing, could not leave message.   Called [phone]. LVM for pt to call office back.

## 2022-06-13 ENCOUNTER — Telehealth: Payer: Self-pay | Admitting: Neurology

## 2022-06-13 ENCOUNTER — Ambulatory Visit
Admission: RE | Admit: 2022-06-13 | Discharge: 2022-06-13 | Disposition: A | Discharge status: Home / Self Care | Payer: Medicare Other | Source: Ambulatory Visit | Attending: Neurology | Admitting: Neurology

## 2022-06-13 DIAGNOSIS — G9340 Encephalopathy, unspecified: Secondary | ICD-10-CM

## 2022-06-13 DIAGNOSIS — R404 Transient alteration of awareness: Secondary | ICD-10-CM

## 2022-06-13 NOTE — Telephone Encounter (Signed)
This number was listed as able to LVM on this phone number- [phone] on the most recent DPR.  I called and this was not the patient's phone number. This number belongs to a Ms. Shumate.  Apologized for the confusion.  I also tried calling the [phone] number and it rang with no VM.

## 2022-06-17 ENCOUNTER — Telehealth: Payer: Self-pay | Admitting: Neurology

## 2022-06-17 NOTE — Telephone Encounter (Signed)
Called the number on file to discuss the results from previous MRI of brain. There was no answer and no VM set up. We have also had difficulty with reaching her to review lab results. I will forward a letter to the patient advising of mri results along with lab results.

## 2022-06-17 NOTE — Telephone Encounter (Signed)
-----   Message from Britt Bottom, MD sent at 06/14/2022 11:55 AM EDT ----- The MRI of the brain was unchanged compared to her previous MRI.  I as before, it shows more age-related changes than is typical but nothing looked new.  There is some fluid in the mastoid air cells on the left.  If she has a stopped up sensation in the left ear she can do intranasal Flonase over-the-counter once a day for about a week

## 2022-06-19 ENCOUNTER — Ambulatory Visit (INDEPENDENT_AMBULATORY_CARE_PROVIDER_SITE_OTHER): Payer: Self-pay | Admitting: Physician Assistant

## 2022-06-19 DIAGNOSIS — Z91199 Patient's noncompliance with other medical treatment and regimen due to unspecified reason: Secondary | ICD-10-CM

## 2022-06-19 NOTE — Progress Notes (Signed)
No show

## 2022-06-25 DIAGNOSIS — M5412 Radiculopathy, cervical region: Secondary | ICD-10-CM | POA: Diagnosis not present

## 2022-06-25 DIAGNOSIS — G90522 Complex regional pain syndrome I of left lower limb: Secondary | ICD-10-CM | POA: Diagnosis not present

## 2022-06-25 DIAGNOSIS — Z79899 Other long term (current) drug therapy: Secondary | ICD-10-CM | POA: Diagnosis not present

## 2022-06-25 DIAGNOSIS — M961 Postlaminectomy syndrome, not elsewhere classified: Secondary | ICD-10-CM | POA: Diagnosis not present

## 2022-06-25 DIAGNOSIS — G894 Chronic pain syndrome: Secondary | ICD-10-CM | POA: Diagnosis not present

## 2022-06-28 ENCOUNTER — Encounter: Payer: Self-pay | Admitting: Physician Assistant

## 2022-06-28 ENCOUNTER — Ambulatory Visit (INDEPENDENT_AMBULATORY_CARE_PROVIDER_SITE_OTHER): Payer: Medicare Other | Admitting: Physician Assistant

## 2022-06-28 DIAGNOSIS — F411 Generalized anxiety disorder: Secondary | ICD-10-CM

## 2022-06-28 DIAGNOSIS — F3341 Major depressive disorder, recurrent, in partial remission: Secondary | ICD-10-CM | POA: Diagnosis not present

## 2022-06-28 DIAGNOSIS — F5105 Insomnia due to other mental disorder: Secondary | ICD-10-CM | POA: Diagnosis not present

## 2022-06-28 DIAGNOSIS — Z6379 Other stressful life events affecting family and household: Secondary | ICD-10-CM

## 2022-06-28 DIAGNOSIS — F99 Mental disorder, not otherwise specified: Secondary | ICD-10-CM

## 2022-06-28 MED ORDER — QUETIAPINE FUMARATE 300 MG PO TABS
300.0000 mg | ORAL_TABLET | Freq: Every day | ORAL | 1 refills | Status: DC
Start: 2022-06-28 — End: 2022-11-20

## 2022-06-28 NOTE — Progress Notes (Unsigned)
[facility]  Patient ID: Becky Wolfe,  MRN: [id number]  PCP: Mayra Neer, MD  Date of Evaluation: 06/28/2022 Time spent:30 minutes  Chief Complaint:  Chief Complaint   Anxiety; Depression; Follow-up    HISTORY/CURRENT STATUS: For routine med check.    Doing well overall.  Her grandson is doing well, he was critically injured in a car wreck last year.  "It is a miracle he is alive."  See social history.    Patient is able to enjoy things.  Energy and motivation are good.  No extreme sadness, tearfulness, or feelings of hopelessness.  Sleeps well most of the time. ADLs and personal hygiene are normal.   Denies any changes in concentration, making decisions, or remembering things.  Appetite has not changed.  Weight is stable.  Denies suicidal or homicidal thoughts.  Patient denies increased energy with decreased need for sleep, increased talkativeness, racing thoughts, impulsivity or risky behaviors, increased spending, increased libido, grandiosity, increased irritability or anger, paranoia, and no hallucinations.  Review of Systems  Constitutional: Negative.   HENT: Negative.    Eyes: Negative.   Respiratory: Negative.    Cardiovascular: Negative.   Gastrointestinal: Negative.   Genitourinary: Negative.   Musculoskeletal:        Chronic left leg pain from RSD  Skin: Negative.   Neurological: Negative.   Endo/Heme/Allergies: Negative.   Psychiatric/Behavioral:         See HPI   Individual Medical History/ Review of Systems: Changes? :Yes    was hospitalized 04/13/2022 with acute respiratory failure and hypoxia, acute encephalopathy likely due to combined medication use, and UTI, methadone was decreased, no other med changes.  Has seen her pain management specialist for complex regional pain syndrome of left leg. See records on chart  Past medications for mental health diagnoses include: Klonopin, Zoloft, Paxil, Celexa, Ativan, Ambien caused sleep talking,  Lamictal she preferred not to take  Allergies: Etodolac, Ambien [zolpidem tartrate], and Namenda [memantine]  Current Medications:  Current Outpatient Medications:    clonazePAM (KLONOPIN) 0.5 MG tablet, TAKE 1 TABLET(0.5 MG) BY MOUTH THREE TIMES DAILY AS NEEDED FOR ANXIETY. (Patient taking differently: Take 0.5 mg by mouth 3 (three) times daily as needed for anxiety.), Disp: 90 tablet, Rfl: 0   cloNIDine (CATAPRES) 0.1 MG tablet, Take 0.1 mg by mouth 2 (two) times daily., Disp: , Rfl:    clopidogrel (PLAVIX) 75 MG tablet, Take 1 tablet (75 mg total) by mouth daily. (Patient taking differently: Take 75 mg by mouth every morning.), Disp: 90 tablet, Rfl: 3   gabapentin (NEURONTIN) 800 MG tablet, Take 800 mg by mouth 3 (three) times daily., Disp: , Rfl: 1   naloxone (NARCAN) nasal spray 4 mg/0.1 mL, Place 0.4 mg into the nose once as needed (opioid overdose)., Disp: , Rfl:    sertraline (ZOLOFT) 100 MG tablet, TAKE 2 TABLETS(200 MG) BY MOUTH DAILY (Patient taking differently: Take 200 mg by mouth every morning.), Disp: 180 tablet, Rfl: 1   methadone (DOLOPHINE) 5 MG tablet, Take 1 tablet (5 mg total) by mouth every 12 (twelve) hours. (Patient not taking: Reported on 06/28/2022), Disp: 30 tablet, Rfl: 0   QUEtiapine (SEROQUEL) 300 MG tablet, Take 1 tablet (300 mg total) by mouth at bedtime., Disp: 90 tablet, Rfl: 1 Medication Side Effects: none  Family Medical/ Social History: Changes? It's been a year since grandson was critically injured in an MVI, 'it's a miracle his still alive but he's doing well, hopefully will get G tube  and trach out soon.  MENTAL HEALTH EXAM:  Last menstrual period 10/16/2011.There is no height or weight on file to calculate BMI.  General Appearance: Casual, Neat and Well Groomed  Eye Contact:  Good  Speech:  Clear and Coherent and Normal Rate  Volume:  Normal  Mood:  Euthymic  Affect:  Congruent  Thought Process:  Goal Directed and Descriptions of Associations:  Circumstantial  Orientation:  Full (Time, Place, and Person)  Thought Content: Logical   Suicidal Thoughts:  No  Homicidal Thoughts:  No  Memory:  WNL  Judgement:  Good  Insight:  Good  Psychomotor Activity:  Normal  Concentration:  Concentration: Good and Attention Span: Good  Recall:  Good  Fund of Knowledge: Good  Language: Good  Assets:  Desire for Improvement Financial Resources/Insurance Housing Transportation  ADL's:  Intact  Cognition: WNL  Prognosis:  Good   Labs and imaging from hospitalization 04/13/2022, 04/14/2022, and 04/15/2022 reviewed.  Labs 06/06/2022 Glucose 87, remainder of CMP normal  DIAGNOSES:    ICD-10-CM   1. Recurrent major depressive disorder, in partial remission ([place])  F33.41     2. Generalized anxiety disorder  F41.1     3. Insomnia due to other mental disorder  F51.05    F99     4. Stress due to illness of family member  Z63.79       Receiving Psychotherapy: No     RECOMMENDATIONS:  PDMP was reviewed. Last Klonopin 05/20/2022.  Methadone and oxycodone known to me.   I provided 30 minutes of face to face time during this encounter, including time spent before and after the visit in records review, medical decision making, counseling pertinent to today's visit, and charting.   She's doing well with mental health medications so no changes need to be made.   Continue Klonopin 0.5 mg, 1 tid prn. Continue clonidine 0.1 mg, 1 p.o. twice daily by another provider. Continue gabapentin 800 mg, 1 p.o. 3 times daily. (She takes bid to tid.) Continue Seroquel 300 mg, q qhs.   Continue Zoloft 100 mg, 2 po qd.  Return in 3 months.  Donnal Moat, PA-C

## 2022-07-19 ENCOUNTER — Ambulatory Visit
Admission: EM | Admit: 2022-07-19 | Discharge: 2022-07-19 | Disposition: A | Discharge status: Home / Self Care | Payer: Medicare Other | Attending: Physician Assistant | Admitting: Physician Assistant

## 2022-07-19 ENCOUNTER — Encounter: Payer: Self-pay | Admitting: Physician Assistant

## 2022-07-19 DIAGNOSIS — H60502 Unspecified acute noninfective otitis externa, left ear: Secondary | ICD-10-CM | POA: Diagnosis not present

## 2022-07-19 LAB — POCT URINALYSIS DIP (MANUAL ENTRY)
Bilirubin, UA: NEGATIVE
Blood, UA: NEGATIVE
Glucose, UA: NEGATIVE mg/dL
Ketones, POC UA: NEGATIVE mg/dL
Leukocytes, UA: NEGATIVE
Nitrite, UA: NEGATIVE
Protein Ur, POC: NEGATIVE mg/dL
Spec Grav, UA: >=1.03 — AB (ref 1.010–1.025)
Urobilinogen, UA: 1 E.U./dL
pH, UA: 5.5 (ref 5.0–8.0)

## 2022-07-19 MED ORDER — CIPROFLOXACIN-DEXAMETHASONE 0.3-0.1 % OT SUSP: 4.0000 [drp] | Freq: Two times a day (BID) | OTIC | 0 refills | Status: AC

## 2022-07-19 NOTE — ED Triage Notes (Signed)
[facility] to [facility] with [facility] of otalgia and ear drainage from bilateral ears for one week. Pt reports also recently being hospitalized for uti and is concerned about reinfection.

## 2022-07-19 NOTE — ED Provider Notes (Signed)
[facility]    CSN: [id number] Arrival date & time: 07/19/22  1351      History   Chief Complaint Chief Complaint  Patient presents with   low back pain    Otalgia    HPI Becky Wolfe is a 61 y.o. female.   Patient here today for evaluation of left sided ear pain that started about a week ago and seems to be worsening. She notes some greenish drainage from her ear. She has not had fever. She denies any congestion or cough. She has felt some drainage in her throat.   She also would like to have urine checked as she recently had UTI that resulted in hospitalization. She has not had any urinary symptoms other than some back pain.   The history is provided by the patient.  Otalgia Associated symptoms: ear discharge   Associated symptoms: no abdominal pain, no congestion, no cough, no diarrhea, no fever, no sore throat and no vomiting     Past Medical History:  Diagnosis Date   Anxiety 06-16-13   hx. panic attacks, none recent   Blood transfusion without reported diagnosis    20 yrs ago after childbirth   Bronchitis    hx of   Chronic neck pain    Constipation    Depression    DVT (deep venous thrombosis) ([place]) 2015   rt. leg    Headache(784.0)    migraines-has decreased   Heart murmur    Dr. Etter Sjogren   Hepatitis C    dx. Hep. C(s/p transfusion age 22) low Hgb  .-multiple transfusions. Treated successfully   Multiple sclerosis ([place])    dx. -75yr ago, then med stopped-due to liver enzymes changes,occ. periods of weakness in arms"d.  [facility] MS[facility] things"   Pneumonia    hx of , ? mild emphysema on CT scan 5'14, multiple pulmonary nodules   RSD (reflex sympathetic dystrophy) 06-16-13   legs   RSD (reflex sympathetic dystrophy) 2009   TIA (transient ischemic attack)    hx of (weakness left side)-none in 1 yr    Patient Active Problem List   Diagnosis Date Noted   Polypharmacy 04/14/2022   Pulmonary nodule  04/14/2022   Normocytic anemia 04/14/2022   Transaminitis 04/14/2022   Acute UTI (urinary tract infection) 04/14/2022   History of hepatitis C 04/14/2022   Acute respiratory failure with hypoxia ([place] 04/14/2022   Middle ear effusion, left 04/14/2022   Acute encephalopathy 04/13/2022   Syncope 08/31/2021   Family history of coronary artery disease in mother 08/31/2021   TIA (transient ischemic attack) 07/11/2021   Slurred speech 03/15/2021   Protruded cervical disc 06/22/2020   Complex regional pain syndrome type 1 of lower extremity 06/22/2020   Numbness 12/30/2018   [place]ical myel[place] 08/05/2018   Neck pain 02/19/2018   History of fusion of cervical spine 02/19/2018   Urinary hesitancy 02/19/2018   Chronic non-specific white matter lesions on MRI 02/19/2018   Accidental overdose 03/07/2016   Atherosclerosis of native coronary artery of native heart without angina pectoris 02/10/2015   History of DVT (deep vein thrombosis) 02/10/2015   Tobacco abuse 02/10/2015   Right leg pain 04/23/2013   DVT (deep vein thrombosis) in pregnancy 04/23/2013   Chronic pain disorder 04/23/2013   RSD lower limb 04/23/2013   Chronic anticoagulation 04/23/2013    Past Surgical History:  Procedure Laterality Date   ABDOMINAL HYSTERECTOMY  ANAL RECTAL MANOMETRY N/A 04/11/2015   Procedure: ANO RECTAL MANOMETRY;  Surgeon: Arta Silence, MD;  Location: [facility];  Service: Endoscopy;  Laterality: N/A;   ANAL RECTAL MANOMETRY N/A 05/21/2017   Procedure: ANO RECTAL MANOMETRY;  Surgeon: Arta Silence, MD;  Location: [facility];  Service: Endoscopy;  Laterality: N/A;   ANKLE SURGERY Right    no retained hardware   ANTERIOR CERVICAL DECOMP/DISCECTOMY FUSION  09/21/2012   Procedure: ANTERIOR CERVICAL DECOMPRESSION/DISCECTOMY FUSION 2 LEVELS;  Surgeon: Otilio Connors, MD;  Location: [facility];  Service: Neurosurgery;  Laterality: N/A;  Cervical five-six, cervical seven-thoracic one Anterior  cervical decompression/diskectomy/fusion/Lifenet bone/trestle plate/Prior Cervical six--seven Anterior cervical fusion/Removing trestle plate   ANTERIOR CERVICAL DECOMP/DISCECTOMY FUSION N/A 08/05/2018   Procedure: Cervical four-five Anterior cervical decompression/discectomy/fusion;  Surgeon: Judith Part, MD;  Location: [place];  Service: Neurosurgery;  Laterality: N/A;   APPENDECTOMY     CESAREAN SECTION     twice   COLONOSCOPY     ESOPHAGOGASTRODUODENOSCOPY (EGD) WITH PROPOFOL N/A 06/30/2013   Procedure: ESOPHAGOGASTRODUODENOSCOPY (EGD) WITH PROPOFOL;  Surgeon: Arta Silence, MD;  Location: [facility];  Service: Endoscopy;  Laterality: N/A;   NECK SURGERY     WEDGE RESECTION     left ovary with cyst removed ,hx. multiple cysts removed prior    OB History   No obstetric history on file.      Home Medications    Prior to Admission medications   Medication Sig Start Date End Date Taking? Authorizing Provider  ciprofloxacin-dexamethasone (CIPRODEX) OTIC suspension Place 4 drops into the left ear 2 (two) times daily for 7 days. 07/19/22 07/26/22 Yes Francene Finders, PA-C  clonazePAM (KLONOPIN) 0.5 MG tablet TAKE 1 TABLET(0.5 MG) BY MOUTH THREE TIMES DAILY AS NEEDED FOR ANXIETY. Patient taking differently: Take 0.5 mg by mouth 3 (three) times daily as needed for anxiety. 03/21/22   Donnal Moat T, PA-C  cloNIDine (CATAPRES) 0.1 MG tablet Take 0.1 mg by mouth 2 (two) times daily.    [provider]  clopidogrel (PLAVIX) 75 MG tablet Take 1 tablet (75 mg total) by mouth daily. Patient taking differently: Take 75 mg by mouth every morning. 07/11/21   Sater, Nanine Means, MD  gabapentin (NEURONTIN) 800 MG tablet Take 800 mg by mouth 3 (three) times daily. 05/12/17   [provider]  methadone (DOLOPHINE) 5 MG tablet Take 1 tablet (5 mg total) by mouth every 12 (twelve) hours. Patient not taking: Reported on 06/28/2022 04/15/22   Geradine Girt, DO  naloxone [facility]) nasal  spray 4 mg/0.1 mL Place 0.4 mg into the nose once as needed (opioid overdose). 04/02/22   [provider]  QUEtiapine (SEROQUEL) 300 MG tablet Take 1 tablet (300 mg total) by mouth at bedtime. 06/28/22   Donnal Moat T, PA-C  sertraline (ZOLOFT) 100 MG tablet TAKE 2 TABLETS(200 MG) BY MOUTH DAILY Patient taking differently: Take 200 mg by mouth every morning. 03/14/22   Addison Lank, PA-C    Family History Family History  Problem Relation Age of Onset   Heart attack Mother 34       with CABG   Heart disease Mother    Hypertension Mother    Migraines Mother    Heart attack Father    Heart disease Father    Diabetes Mellitus II Father    Hypertension Father    Lupus Sister    Depression Sister    Migraines Sister    Renal Disease Brother  Diabetes Sister    Hypertension Sister    Migraines Sister    Migraines Sister    Healthy Sister    Healthy Sister    Healthy Sister    Healthy Brother    Dementia Maternal Grandfather    Colon cancer Maternal Grandmother    Heart disease Maternal Grandmother    Hypertension Maternal Grandmother    Healthy Son    Migraines Son     Social History Social History   Tobacco Use   Smoking status: Some Days    Packs/day: 0.12    Years: 36.00    Total pack years: 4.32    Types: Cigarettes   Smokeless tobacco: Never   Tobacco comments:    0-2 per day  Vaping Use   Vaping Use: Never used  Substance Use Topics   Alcohol use: No   Drug use: No     Allergies   Etodolac, Ambien [zolpidem tartrate], and Namenda [memantine]   Review of Systems Review of Systems  Constitutional:  Negative for chills and fever.  HENT:  Positive for ear discharge and ear pain. Negative for congestion and sore throat.   Eyes:  Negative for discharge and redness.  Respiratory:  Negative for cough, shortness of breath and wheezing.   Gastrointestinal:  Negative for abdominal pain, diarrhea, nausea and vomiting.  Genitourinary:  Negative for  dysuria.  Musculoskeletal:  Positive for back pain.     Physical Exam Triage Vital Signs ED Triage Vitals  Enc Vitals Group     BP 07/19/22 1411 128/78     Pulse Rate 07/19/22 1411 83     Resp 07/19/22 1411 18     Temp 07/19/22 1411 (!) 97.5 F (36.4 C)     Temp Source 07/19/22 1411 Oral     SpO2 07/19/22 1411 98 %     Weight --      Height --      Head Circumference --      Peak Flow --      Pain Score 07/19/22 1410 0     Pain Loc --      Pain Edu? --      Excl. in [place]? --    No data found.  Updated Vital Signs BP 128/78 (BP Location: Right Arm)   Pulse 83   Temp (!) 97.5 F (36.4 C) (Oral)   Resp 18   LMP 10/16/2011   SpO2 98%   Physical Exam Vitals and nursing note reviewed.  Constitutional:      General: She is not in acute distress.    Appearance: Normal appearance. She is not ill-appearing.  HENT:     Head: Normocephalic and atraumatic.     Right Ear: Tympanic membrane normal.     Ears:     Comments: Left EAC inflamed, unable to see TM due to swelling    Nose: Congestion present.     Mouth/Throat:     Mouth: Mucous membranes are moist.     Pharynx: No oropharyngeal exudate or posterior oropharyngeal erythema.  Eyes:     Conjunctiva/sclera: Conjunctivae normal.  Cardiovascular:     Rate and Rhythm: Normal rate and regular rhythm.     Heart sounds: Normal heart sounds. No murmur heard. Pulmonary:     Effort: Pulmonary effort is normal. No respiratory distress.     Breath sounds: Normal breath sounds. No wheezing, rhonchi or rales.  Skin:    General: Skin is warm and dry.  Neurological:  Mental Status: She is alert.  Psychiatric:        Mood and Affect: Mood normal.        Thought Content: Thought content normal.      UC Treatments / Results  Labs (all labs ordered are listed, but only abnormal results are displayed) Labs Reviewed  POCT URINALYSIS DIP (MANUAL ENTRY) - Abnormal; Notable for the following components:      Result Value    Spec Grav, UA >=1.030 (*)    All other components within normal limits    EKG   Radiology No results found.  Procedures Procedures (including critical care time)  Medications Ordered in UC Medications - No data to display  Initial Impression / Assessment and Plan / UC Course  I have reviewed the triage vital signs and the nursing notes.  Pertinent labs & imaging results that were available during my care of the patient were reviewed by me and considered in my medical decision making (see chart for details).    Reassured patient that urine looked clear, no concern for further infection. Will treat to cover otitis externa with antibiotic drops and encouraged follow up if no gradual improvement or with any further concerns.   Final Clinical Impressions(s) / UC Diagnoses   Final diagnoses:  Acute otitis externa of left ear, unspecified type   Discharge Instructions   None    ED Prescriptions     Medication Sig Dispense Auth. Provider   ciprofloxacin-dexamethasone (CIPRODEX) OTIC suspension Place 4 drops into the left ear 2 (two) times daily for 7 days. 7.5 mL Francene Finders, PA-C      PDMP not reviewed this encounter.   Francene Finders, PA-C 07/19/22 1728

## 2022-07-22 ENCOUNTER — Other Ambulatory Visit: Payer: Self-pay | Admitting: Physician Assistant

## 2022-07-22 MED ORDER — CLONAZEPAM 1 MG PO TABS: 0.5000 mg | ORAL_TABLET | Freq: Three times a day (TID) | ORAL | 0 refills | Status: DC | PRN

## 2022-07-22 NOTE — Telephone Encounter (Signed)
PT called and said that her pharmacy doesn't have the 0.5 mg of klonopin in stock. They do have 1 mg and she would  a script of that sent into the same pharamcay

## 2022-08-14 ENCOUNTER — Other Ambulatory Visit: Payer: Self-pay | Admitting: Nurse Practitioner

## 2022-08-14 DIAGNOSIS — K7469 Other cirrhosis of liver: Secondary | ICD-10-CM

## 2022-08-14 DIAGNOSIS — R748 Abnormal levels of other serum enzymes: Secondary | ICD-10-CM

## 2022-08-21 DIAGNOSIS — Z79899 Other long term (current) drug therapy: Secondary | ICD-10-CM | POA: Diagnosis not present

## 2022-08-21 DIAGNOSIS — M5412 Radiculopathy, cervical region: Secondary | ICD-10-CM | POA: Diagnosis not present

## 2022-08-21 DIAGNOSIS — G894 Chronic pain syndrome: Secondary | ICD-10-CM | POA: Diagnosis not present

## 2022-08-21 DIAGNOSIS — G90522 Complex regional pain syndrome I of left lower limb: Secondary | ICD-10-CM | POA: Diagnosis not present

## 2022-08-21 DIAGNOSIS — M961 Postlaminectomy syndrome, not elsewhere classified: Secondary | ICD-10-CM | POA: Diagnosis not present

## 2022-08-22 ENCOUNTER — Ambulatory Visit
Admission: RE | Admit: 2022-08-22 | Discharge: 2022-08-22 | Disposition: A | Discharge status: Home / Self Care | Payer: Medicare Other | Source: Ambulatory Visit | Attending: Nurse Practitioner | Admitting: Nurse Practitioner

## 2022-08-22 DIAGNOSIS — K7469 Other cirrhosis of liver: Secondary | ICD-10-CM

## 2022-08-22 DIAGNOSIS — K7460 Unspecified cirrhosis of liver: Secondary | ICD-10-CM | POA: Diagnosis not present

## 2022-08-22 DIAGNOSIS — R748 Abnormal levels of other serum enzymes: Secondary | ICD-10-CM

## 2022-08-28 ENCOUNTER — Other Ambulatory Visit: Payer: Self-pay | Admitting: Neurology

## 2022-08-28 ENCOUNTER — Other Ambulatory Visit: Payer: Self-pay | Admitting: Physician Assistant

## 2022-08-28 ENCOUNTER — Other Ambulatory Visit: Payer: Self-pay

## 2022-08-28 DIAGNOSIS — R6889 Other general symptoms and signs: Secondary | ICD-10-CM | POA: Diagnosis not present

## 2022-08-28 DIAGNOSIS — J029 Acute pharyngitis, unspecified: Secondary | ICD-10-CM | POA: Diagnosis not present

## 2022-08-28 DIAGNOSIS — I679 Cerebrovascular disease, unspecified: Secondary | ICD-10-CM | POA: Diagnosis not present

## 2022-08-28 DIAGNOSIS — R946 Abnormal results of thyroid function studies: Secondary | ICD-10-CM | POA: Diagnosis not present

## 2022-08-28 DIAGNOSIS — Z720 Tobacco use: Secondary | ICD-10-CM | POA: Diagnosis not present

## 2022-08-28 DIAGNOSIS — D649 Anemia, unspecified: Secondary | ICD-10-CM | POA: Diagnosis not present

## 2022-08-28 DIAGNOSIS — G9050 Complex regional pain syndrome I, unspecified: Secondary | ICD-10-CM | POA: Diagnosis not present

## 2022-08-28 DIAGNOSIS — Z20822 Contact with and (suspected) exposure to covid-19: Secondary | ICD-10-CM | POA: Diagnosis not present

## 2022-08-28 DIAGNOSIS — R509 Fever, unspecified: Secondary | ICD-10-CM | POA: Diagnosis not present

## 2022-08-28 DIAGNOSIS — Z79899 Other long term (current) drug therapy: Secondary | ICD-10-CM | POA: Diagnosis not present

## 2022-08-28 DIAGNOSIS — I10 Essential (primary) hypertension: Secondary | ICD-10-CM | POA: Diagnosis not present

## 2022-08-28 MED ORDER — CLONAZEPAM 0.5 MG PO TABS: ORAL_TABLET | ORAL | 0 refills | Status: DC

## 2022-09-18 DIAGNOSIS — G894 Chronic pain syndrome: Secondary | ICD-10-CM | POA: Diagnosis not present

## 2022-09-18 DIAGNOSIS — Z5181 Encounter for therapeutic drug level monitoring: Secondary | ICD-10-CM | POA: Diagnosis not present

## 2022-09-18 DIAGNOSIS — M5412 Radiculopathy, cervical region: Secondary | ICD-10-CM | POA: Diagnosis not present

## 2022-09-18 DIAGNOSIS — G90522 Complex regional pain syndrome I of left lower limb: Secondary | ICD-10-CM | POA: Diagnosis not present

## 2022-09-18 DIAGNOSIS — Z79899 Other long term (current) drug therapy: Secondary | ICD-10-CM | POA: Diagnosis not present

## 2022-09-18 DIAGNOSIS — M961 Postlaminectomy syndrome, not elsewhere classified: Secondary | ICD-10-CM | POA: Diagnosis not present

## 2022-10-08 ENCOUNTER — Ambulatory Visit (INDEPENDENT_AMBULATORY_CARE_PROVIDER_SITE_OTHER): Payer: Medicare Other | Admitting: Physician Assistant

## 2022-10-09 ENCOUNTER — Ambulatory Visit (INDEPENDENT_AMBULATORY_CARE_PROVIDER_SITE_OTHER): Payer: Medicare Other | Admitting: Physician Assistant

## 2022-10-09 ENCOUNTER — Encounter: Payer: Self-pay | Admitting: Physician Assistant

## 2022-10-09 DIAGNOSIS — F331 Major depressive disorder, recurrent, moderate: Secondary | ICD-10-CM

## 2022-10-09 DIAGNOSIS — Z6379 Other stressful life events affecting family and household: Secondary | ICD-10-CM

## 2022-10-09 DIAGNOSIS — F99 Mental disorder, not otherwise specified: Secondary | ICD-10-CM

## 2022-10-09 DIAGNOSIS — F5105 Insomnia due to other mental disorder: Secondary | ICD-10-CM | POA: Diagnosis not present

## 2022-10-09 DIAGNOSIS — F411 Generalized anxiety disorder: Secondary | ICD-10-CM

## 2022-10-09 MED ORDER — CLONAZEPAM 0.5 MG PO TABS
0.5000 mg | ORAL_TABLET | Freq: Four times a day (QID) | ORAL | 0 refills | Status: DC | PRN
Start: 2022-10-09 — End: 2022-11-20

## 2022-10-09 MED ORDER — BUPROPION HCL ER (XL) 150 MG PO TB24: 150.0000 mg | ORAL_TABLET | Freq: Every day | ORAL | 1 refills | Status: DC

## 2022-10-09 MED ORDER — SERTRALINE HCL 100 MG PO TABS: ORAL_TABLET | ORAL | 1 refills | Status: DC
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
## 2022-11-15 DIAGNOSIS — I2510 Atherosclerotic heart disease of native coronary artery without angina pectoris: Secondary | ICD-10-CM | POA: Diagnosis not present

## 2022-11-15 DIAGNOSIS — G9050 Complex regional pain syndrome I, unspecified: Secondary | ICD-10-CM | POA: Diagnosis not present

## 2022-11-15 DIAGNOSIS — I10 Essential (primary) hypertension: Secondary | ICD-10-CM | POA: Diagnosis not present

## 2022-11-15 DIAGNOSIS — G4700 Insomnia, unspecified: Secondary | ICD-10-CM | POA: Diagnosis not present

## 2022-11-20 ENCOUNTER — Encounter: Payer: Self-pay | Admitting: Physician Assistant

## 2022-11-20 ENCOUNTER — Ambulatory Visit (INDEPENDENT_AMBULATORY_CARE_PROVIDER_SITE_OTHER): Payer: Medicare Other | Admitting: Physician Assistant

## 2022-11-20 DIAGNOSIS — F411 Generalized anxiety disorder: Secondary | ICD-10-CM | POA: Diagnosis not present

## 2022-11-20 DIAGNOSIS — Z6379 Other stressful life events affecting family and household: Secondary | ICD-10-CM

## 2022-11-20 DIAGNOSIS — F331 Major depressive disorder, recurrent, moderate: Secondary | ICD-10-CM | POA: Diagnosis not present

## 2022-11-20 MED ORDER — QUETIAPINE FUMARATE 300 MG PO TABS: 300.0000 mg | ORAL_TABLET | Freq: Every day | ORAL | 1 refills | Status: DC

## 2022-11-20 MED ORDER — CLONAZEPAM 0.5 MG PO TABS: 0.5000 mg | ORAL_TABLET | Freq: Four times a day (QID) | ORAL | 0 refills | Status: DC | PRN

## 2022-11-20 MED ORDER — BUPROPION HCL ER (XL) 150 MG PO TB24: 150.0000 mg | ORAL_TABLET | Freq: Every day | ORAL | 1 refills | Status: DC
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
## 2022-11-26 ENCOUNTER — Ambulatory Visit (INDEPENDENT_AMBULATORY_CARE_PROVIDER_SITE_OTHER): Payer: Medicare Other | Admitting: Behavioral Health

## 2022-12-03 DIAGNOSIS — G894 Chronic pain syndrome: Secondary | ICD-10-CM | POA: Diagnosis not present

## 2022-12-03 DIAGNOSIS — G90522 Complex regional pain syndrome I of left lower limb: Secondary | ICD-10-CM | POA: Diagnosis not present

## 2022-12-03 DIAGNOSIS — M961 Postlaminectomy syndrome, not elsewhere classified: Secondary | ICD-10-CM | POA: Diagnosis not present

## 2022-12-03 DIAGNOSIS — M5412 Radiculopathy, cervical region: Secondary | ICD-10-CM | POA: Diagnosis not present

## 2022-12-03 DIAGNOSIS — Z79899 Other long term (current) drug therapy: Secondary | ICD-10-CM | POA: Diagnosis not present

## 2022-12-04 ENCOUNTER — Ambulatory Visit (INDEPENDENT_AMBULATORY_CARE_PROVIDER_SITE_OTHER): Payer: Medicare Other | Admitting: Behavioral Health

## 2022-12-04 DIAGNOSIS — F411 Generalized anxiety disorder: Secondary | ICD-10-CM | POA: Diagnosis not present

## 2022-12-04 DIAGNOSIS — F410 Panic disorder [episodic paroxysmal anxiety] without agoraphobia: Secondary | ICD-10-CM | POA: Diagnosis not present
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
## 2022-12-09 ENCOUNTER — Encounter: Payer: Self-pay | Admitting: Behavioral Health

## 2022-12-12 ENCOUNTER — Telehealth: Payer: Self-pay | Admitting: Neurology

## 2022-12-12 NOTE — Telephone Encounter (Signed)
Pt husband (on [place]) came in expressing that he is needing help with his wife. He states he feel like he is losing her and her mind is getting worse. States she thinks people are coming in her house to get her and is very confused a lot of the time.  Pt husband states he does not want her to know that he is asking for help, so it may be hard having a conversation on the phone about this if she is around, but pt husband would like a call to discuss. [phone]

## 2022-12-12 NOTE — Telephone Encounter (Signed)
Called husband back at [phone]. Husband states sx have been going on (not new) and have worsened around 3 weeks to a month ago. Husband does not think methadone that she started back on last week by pain medicine playing a part. She is fixating on things, thinks people coming in and out of home/coming through the walls. This has been going on chronically. She has had a UTI in the past. Does not feel she has one now.  Scheduled work in appt 12/17/22 at [facility] with Dr. Felecia Shelling. Asked she check in by 8:30am.   Aware she was due for appt anyways. Dr. Felecia Shelling last saw her 05/2022 and wanted to see her back in 6 months.

## 2022-12-17 ENCOUNTER — Telehealth: Payer: Self-pay | Admitting: Neurology

## 2022-12-17 ENCOUNTER — Encounter: Payer: Self-pay | Admitting: Neurology

## 2022-12-17 ENCOUNTER — Ambulatory Visit: Payer: Medicare Other | Admitting: Neurology

## 2022-12-17 VITALS — BP 143/70 | HR 70 | Ht 62.0 in | Wt 158.5 lb

## 2022-12-17 DIAGNOSIS — R29898 Other symptoms and signs involving the musculoskeletal system: Secondary | ICD-10-CM

## 2022-12-17 DIAGNOSIS — F418 Other specified anxiety disorders: Secondary | ICD-10-CM

## 2022-12-17 DIAGNOSIS — Z981 Arthrodesis status: Secondary | ICD-10-CM | POA: Diagnosis not present

## 2022-12-17 DIAGNOSIS — G90529 Complex regional pain syndrome I of unspecified lower limb: Secondary | ICD-10-CM

## 2022-12-17 DIAGNOSIS — R269 Unspecified abnormalities of gait and mobility: Secondary | ICD-10-CM | POA: Diagnosis not present

## 2022-12-17 DIAGNOSIS — M4802 Spinal stenosis, cervical region: Secondary | ICD-10-CM | POA: Diagnosis not present

## 2022-12-17 DIAGNOSIS — R3911 Hesitancy of micturition: Secondary | ICD-10-CM | POA: Diagnosis not present

## 2022-12-17 DIAGNOSIS — F29 Unspecified psychosis not due to a substance or known physiological condition: Secondary | ICD-10-CM

## 2022-12-17 DIAGNOSIS — Z86718 Personal history of other venous thrombosis and embolism: Secondary | ICD-10-CM | POA: Diagnosis not present

## 2022-12-17 DIAGNOSIS — G894 Chronic pain syndrome: Secondary | ICD-10-CM

## 2022-12-17 MED ORDER — HYDROXYZINE HCL 25 MG PO TABS: ORAL_TABLET | ORAL | 0 refills | Status: DC
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
## 2022-12-17 NOTE — Telephone Encounter (Signed)
[employer] medicare NPR sent to GI [phone]

## 2022-12-18 LAB — ANTIPHOSPHOLIPID SYNDROME COMP

## 2022-12-19 LAB — ANTIPHOSPHOLIPID SYNDROME COMP

## 2022-12-25 LAB — STATUS REPORT

## 2022-12-26 ENCOUNTER — Ambulatory Visit (INDEPENDENT_AMBULATORY_CARE_PROVIDER_SITE_OTHER): Payer: Medicare Other | Admitting: Behavioral Health

## 2022-12-26 DIAGNOSIS — R443 Hallucinations, unspecified: Secondary | ICD-10-CM | POA: Diagnosis not present

## 2022-12-26 DIAGNOSIS — F411 Generalized anxiety disorder: Secondary | ICD-10-CM

## 2022-12-26 DIAGNOSIS — F410 Panic disorder [episodic paroxysmal anxiety] without agoraphobia: Secondary | ICD-10-CM | POA: Diagnosis not present

## 2022-12-26 LAB — ANTIPHOSPHOLIPID SYNDROME COMP
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
## 2022-12-29 ENCOUNTER — Encounter: Payer: Self-pay | Admitting: Behavioral Health

## 2022-12-30 ENCOUNTER — Telehealth: Payer: Self-pay | Admitting: Neurology

## 2022-12-30 NOTE — Telephone Encounter (Signed)
Ladies this patients husband came in to see if the results from the tests were in from her 2/6 visit. Her husband(on the DPR) is concerned because she is getting worse and patient thinks she may have some sort of bladder infection. Please give him a call to review the results if they have come back and to advise on what she can do.  Thank you

## 2022-12-31 LAB — CBC WITH DIFFERENTIAL/PLATELET
Basophils Absolute: 0 10*3/uL (ref 0.0–0.2)
Basos: 1 %
EOS (ABSOLUTE): 0.1 10*3/uL (ref 0.0–0.4)
Eos: 2 %
Hematocrit: 35.2 % (ref 34.0–46.6)
Hemoglobin: 12.3 g/dL (ref 11.1–15.9)
Immature Grans (Abs): 0 10*3/uL (ref 0.0–0.1)
Immature Granulocytes: 0 %
Lymphocytes Absolute: 1.4 10*3/uL (ref 0.7–3.1)
Lymphs: 39 %
MCH: 29 pg (ref 26.6–33.0)
MCHC: 34.9 g/dL (ref 31.5–35.7)
MCV: 83 fL (ref 79–97)
Monocytes Absolute: 0.3 10*3/uL (ref 0.1–0.9)
Monocytes: 7 %
Neutrophils Absolute: 1.8 10*3/uL (ref 1.4–7.0)
Neutrophils: 51 %
Platelets: 199 10*3/uL (ref 150–450)
RBC: 4.24 x10E6/uL (ref 3.77–5.28)
RDW: 12.4 % (ref 11.7–15.4)
WBC: 3.6 10*3/uL (ref 3.4–10.8)

## 2022-12-31 LAB — URINALYSIS
Bilirubin, UA: NEGATIVE
Glucose, UA: NEGATIVE
Ketones, UA: NEGATIVE
Nitrite, UA: NEGATIVE
RBC, UA: NEGATIVE
Specific Gravity, UA: 1.024 (ref 1.005–1.030)
Urobilinogen, Ur: 1 mg/dL (ref 0.2–1.0)
pH, UA: 6 (ref 5.0–7.5)

## 2022-12-31 LAB — ANTIPHOSPHOLIPID SYNDROME COMP
Anticardiolipin Ab, IgA: <10 [APL'U]
Anticardiolipin Ab, IgG: <10 [GPL'U]
Anticardiolipin Ab, IgM: <10 [MPL'U]
Antiphosphatidylserine IgG: 0 {GPS'U}
Antiphosphatidylserine IgM: 6 {MPS'U}
Antiprothrombin Antibody, IgG: 4 G units
Beta-2 Glycoprotein I, IgA: <10 SAU
Beta-2 Glycoprotein I, IgG: <10 SGU
Beta-2 Glycoprotein I, IgM: <10 SMU

## 2022-12-31 LAB — URINE CULTURE

## 2023-01-01 ENCOUNTER — Ambulatory Visit: Payer: Medicare Other | Admitting: Physician Assistant

## 2023-01-01 ENCOUNTER — Telehealth: Payer: Self-pay | Admitting: *Deleted

## 2023-01-01 NOTE — Telephone Encounter (Signed)
Called husband at [phone]. Relayed results below per Dr. Felecia Shelling. He verbalized understanding.   I checked and appears [facility] has tried a couple times to call to schedule MRI cervical. I provided him their phone number to call and schedule: [phone]. He will call back if he has any trouble scheduling.   "Please let them know that the blood counts and the urine tests were fine. "

## 2023-01-01 NOTE — Telephone Encounter (Signed)
-----   Message from Britt Bottom, MD sent at 01/01/2023  8:01 AM EST ----- Please let them know that the additional blood test I did (antiphospholipid syndrome) came back normal.  We have been concerned about urine infection when I saw her 2 weeks ago but the culture had come back negative.  I had ordered an MRI of the cervical spine.  Could you check on the progress of that, it does not look like it has been scheduled yet.

## 2023-01-02 ENCOUNTER — Encounter: Payer: Self-pay | Admitting: Physician Assistant

## 2023-01-02 ENCOUNTER — Ambulatory Visit (INDEPENDENT_AMBULATORY_CARE_PROVIDER_SITE_OTHER): Payer: Medicare Other | Admitting: Physician Assistant

## 2023-01-02 DIAGNOSIS — F4310 Post-traumatic stress disorder, unspecified: Secondary | ICD-10-CM | POA: Diagnosis not present

## 2023-01-02 DIAGNOSIS — F99 Mental disorder, not otherwise specified: Secondary | ICD-10-CM

## 2023-01-02 DIAGNOSIS — F5105 Insomnia due to other mental disorder: Secondary | ICD-10-CM

## 2023-01-02 DIAGNOSIS — Z6379 Other stressful life events affecting family and household: Secondary | ICD-10-CM | POA: Diagnosis not present

## 2023-01-02 DIAGNOSIS — F411 Generalized anxiety disorder: Secondary | ICD-10-CM
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
## 2023-01-11 ENCOUNTER — Emergency Department (HOSPITAL_COMMUNITY): Payer: Medicare Other

## 2023-01-11 ENCOUNTER — Emergency Department (HOSPITAL_BASED_OUTPATIENT_CLINIC_OR_DEPARTMENT_OTHER): Payer: Medicare Other

## 2023-01-11 ENCOUNTER — Other Ambulatory Visit: Payer: Self-pay

## 2023-01-11 ENCOUNTER — Emergency Department (HOSPITAL_BASED_OUTPATIENT_CLINIC_OR_DEPARTMENT_OTHER)
Admission: EM | Admit: 2023-01-11 | Discharge: 2023-01-11 | Disposition: A | Discharge status: Home / Self Care | Payer: Medicare Other | Attending: Emergency Medicine | Admitting: Emergency Medicine

## 2023-01-11 ENCOUNTER — Encounter (HOSPITAL_BASED_OUTPATIENT_CLINIC_OR_DEPARTMENT_OTHER): Payer: Self-pay

## 2023-01-11 DIAGNOSIS — I700 Atherosclerosis of aorta: Secondary | ICD-10-CM | POA: Insufficient documentation

## 2023-01-11 DIAGNOSIS — M545 Low back pain, unspecified: Secondary | ICD-10-CM | POA: Insufficient documentation

## 2023-01-11 DIAGNOSIS — F17210 Nicotine dependence, cigarettes, uncomplicated: Secondary | ICD-10-CM | POA: Insufficient documentation

## 2023-01-11 DIAGNOSIS — R109 Unspecified abdominal pain: Secondary | ICD-10-CM | POA: Insufficient documentation

## 2023-01-11 DIAGNOSIS — R309 Painful micturition, unspecified: Secondary | ICD-10-CM | POA: Diagnosis not present

## 2023-01-11 DIAGNOSIS — R3915 Urgency of urination: Secondary | ICD-10-CM | POA: Diagnosis not present

## 2023-01-11 DIAGNOSIS — R339 Retention of urine, unspecified: Secondary | ICD-10-CM | POA: Diagnosis not present

## 2023-01-11 DIAGNOSIS — K5641 Fecal impaction: Secondary | ICD-10-CM | POA: Diagnosis not present

## 2023-01-11 LAB — CBC WITH DIFFERENTIAL/PLATELET
Abs Immature Granulocytes: 0.02 10*3/uL (ref 0.00–0.07)
Basophils Absolute: 0 10*3/uL (ref 0.0–0.1)
Basophils Relative: 1 %
Eosinophils Absolute: 0.1 10*3/uL (ref 0.0–0.5)
Eosinophils Relative: 2 %
HCT: 38.2 % (ref 36.0–46.0)
Hemoglobin: 13.3 g/dL (ref 12.0–15.0)
Immature Granulocytes: 0 %
Lymphocytes Relative: 33 %
Lymphs Abs: 1.9 10*3/uL (ref 0.7–4.0)
MCH: 29 pg (ref 26.0–34.0)
MCHC: 34.8 g/dL (ref 30.0–36.0)
MCV: 83.4 fL (ref 80.0–100.0)
Monocytes Absolute: 0.5 10*3/uL (ref 0.1–1.0)
Monocytes Relative: 8 %
Neutro Abs: 3.3 10*3/uL (ref 1.7–7.7)
Neutrophils Relative %: 56 %
Platelets: 222 10*3/uL (ref 150–400)
RBC: 4.58 MIL/uL (ref 3.87–5.11)
RDW: 12.6 % (ref 11.5–15.5)
WBC: 5.8 10*3/uL (ref 4.0–10.5)
nRBC: 0 % (ref 0.0–0.2)

## 2023-01-11 LAB — URINALYSIS, W/ REFLEX TO CULTURE (INFECTION SUSPECTED)
Bilirubin Urine: NEGATIVE
Glucose, UA: NEGATIVE mg/dL
Hgb urine dipstick: NEGATIVE
Ketones, ur: NEGATIVE mg/dL
Leukocytes,Ua: NEGATIVE
Nitrite: NEGATIVE
Protein, ur: NEGATIVE mg/dL
Specific Gravity, Urine: 1.02 (ref 1.005–1.030)
Squamous Epithelial / HPF: NONE SEEN /HPF (ref 0–5)
WBC, UA: NONE SEEN WBC/hpf (ref 0–5)
pH: 5.5 (ref 5.0–8.0)

## 2023-01-11 LAB — BASIC METABOLIC PANEL
Anion gap: 6 (ref 5–15)
BUN: 10 mg/dL (ref 8–23)
CO2: 27 mmol/L (ref 22–32)
Calcium: 8.9 mg/dL (ref 8.9–10.3)
Chloride: 104 mmol/L (ref 98–111)
Creatinine, Ser: 0.97 mg/dL (ref 0.44–1.00)
GFR, Estimated: >60 mL/min (ref >60)
Glucose, Bld: 98 mg/dL (ref 70–99)
Potassium: 4.2 mmol/L (ref 3.5–5.1)
Sodium: 137 mmol/L (ref 135–145)

## 2023-01-11 MED ORDER — ONDANSETRON HCL 4 MG/2ML IJ SOLN
4.0000 mg | Freq: Once | INTRAMUSCULAR | Status: AC
  Administered 2023-01-11: 4 mg via INTRAVENOUS
  Filled 2023-01-11: qty 2

## 2023-01-11 MED ORDER — SODIUM CHLORIDE 0.9 % IV SOLN: Freq: Once | INTRAVENOUS | Status: AC

## 2023-01-11 MED ORDER — FENTANYL CITRATE PF 50 MCG/ML IJ SOSY
100.0000 ug | PREFILLED_SYRINGE | Freq: Once | INTRAMUSCULAR | Status: AC
  Administered 2023-01-11: 100 ug via INTRAVENOUS
  Filled 2023-01-11: qty 2

## 2023-01-11 MED ORDER — PREDNISONE 10 MG (21) PO TBPK: ORAL_TABLET | Freq: Every day | ORAL | 0 refills | Status: DC

## 2023-01-11 MED ORDER — ONDANSETRON 4 MG PO TBDP
8.0000 mg | ORAL_TABLET | Freq: Once | ORAL | Status: AC
  Administered 2023-01-11: 8 mg via ORAL
  Filled 2023-01-11: qty 2

## 2023-01-11 MED ORDER — HYDROCODONE-ACETAMINOPHEN 5-325 MG PO TABS
1.0000 | ORAL_TABLET | Freq: Once | ORAL | Status: AC
  Administered 2023-01-11: 1 via ORAL
  Filled 2023-01-11: qty 1

## 2023-01-11 NOTE — ED Provider Triage Note (Signed)
Emergency Medicine Provider Triage Evaluation Note  Becky Wolfe , a 62 y.o. female  was evaluated in triage.  Pt complains of difficulty with urination, was seen at med center earlier today, endorsing left lower back pain started about 2 days ago, remained constant, has gotten worse, does not radiate, no associated paresthesias or weakness moving down her legs denies any saddle paresthesias, she does notice that she is having difficulty with urination, this has been going for last month.  Denies any recent trauma to her back she has no other complaints.  The patient's chart was seen at [facility] had a benign workup, there is concern for spinal cord abnormality she is endorsing back pain and difficulty urination she was sent here for MRI for further evaluation please see Dr. Burnett Sheng note for full detail.  Review of Systems  Positive: Back pain Negative: Chest pain, shortness of breath  Physical Exam  BP 128/69   Pulse 72   Temp 98.4 F (36.9 C) (Oral)   Resp 20   Ht '5\' 2"'$  (1.575 m)   Wt 71 kg   LMP 10/16/2011   SpO2 94%   BMI 28.63 kg/m  Gen:   Awake, no distress   Resp:  Normal effort  MSK:   Moves extremities without difficulty back was palpated tenderness palpation around the lumbar spine no crepitus deformities noted. Other:  Patient has 4-5 strength in lower extremities bilaterally, sensation tact with light touch, she has 2+ patellar reflexes.   Medical Decision Making  Medically screening exam initiated at 6:23 AM.  Appropriate orders placed.  Becky Wolfe was informed that the remainder of the evaluation will be completed by another provider, this initial triage assessment does not replace that evaluation, and the importance of remaining in the ED until their evaluation is complete.  Imaging have been ordered will need further workup.   Marcello Fennel, PA-C 01/11/23 [phone]

## 2023-01-11 NOTE — Discharge Instructions (Addendum)
Your MRIs today did not show any concerning findings.  It did show some degenerative change.  Your pain could be coming from inflammation to the nerve particularly the sciatic nerve.  I have given you prescription for steroids which will help with the inflammation. For any concerning symptoms return to the emergency department.  Otherwise follow-up with your primary care provider.  Have also given you a referral to spine clinic.

## 2023-01-11 NOTE — ED Triage Notes (Signed)
Pt reports she woke up and reports she could hardly move due to pain in LT flank and LT lower back. Pt recently dx with UTI and states she is having similar sx. Dysuria and increased urgency.

## 2023-01-11 NOTE — ED Provider Notes (Signed)
Patient arrived from med center for further evaluation with MRI to rule out cauda equina syndrome due to urinary retention.  She has been able to urinate in the emergency department since being here.  MRIs have resulted and did not show evidence of cauda equina syndrome, or spinal epidural abscess.  She is ambulating.  She does report pain to her right low back.  Question if this is sciatica from the degenerative disease noted on the MRI.  Will start patient on steroid Dosepak. She is in agreement with this plan.  Spine clinic referral given.  On exam she does have full range of motion in bilateral lower extremities as well as 5/5 strength in the extensor and flexor muscle group.  She ambulates without difficulty.  Discussed with attending.  Physical Exam  BP (!) 111/53   Pulse (!) 59   Temp 98 F (36.7 C)   Resp 16   Ht '5\' 2"'$  (1.575 m)   Wt 71 kg   LMP 10/16/2011   SpO2 93%   BMI 28.63 kg/m     Procedures  Procedures  ED Course / MDM           Evlyn Courier, PA-C 01/11/23 1515    Lajean Saver, MD 01/12/23 1212

## 2023-01-11 NOTE — ED Notes (Signed)
Patient transported to CT 

## 2023-01-11 NOTE — ED Notes (Signed)
Patient transferred from [facility]. Patient transferred for MRI.

## 2023-01-11 NOTE — ED Notes (Signed)
Pt retaining urine on CT scan. MD verbally ordered bladder scan and in&out catheter. Approx 25m on bladder scan, 300 ml drained from in and out catheter. UA sent to the lab.

## 2023-01-11 NOTE — ED Notes (Signed)
Report given to [facility], RN at [facility] ED

## 2023-01-11 NOTE — ED Provider Notes (Signed)
[facility] Provider Note: Georgena Spurling, MD, FACEP  CSN: [id number] MRN: [id number] ARRIVAL: 01/11/23 at [place]  Flank Pain   HISTORY OF PRESENT ILLNESS  01/11/23 2:08 AM Becky Wolfe is a 62 y.o. female awakened just prior to arrival left lower back pain and, less severely, left flank pain.  She was recently diagnosed with a urinary tract infection and states that his symptoms are similar.  She is having burning with urination and urinary urgency but not frequency.  She rates her pain as a 9 out of 10.  Pain is worse with movement..   Past Medical History:  Diagnosis Date   Anxiety 06-16-13   hx. panic attacks, none recent   Blood transfusion without reported diagnosis    20 yrs ago after childbirth   Bronchitis    hx of   Chronic neck pain    Constipation    Depression    DVT (deep venous thrombosis) ([place]) 2015   rt. leg    Headache(784.0)    migraines-has decreased   Heart murmur    Dr. Etter Sjogren   Hepatitis C    dx. Hep. C(s/p transfusion age 60) low Hgb  .-multiple transfusions. Treated successfully   Multiple sclerosis ([place])    dx. -75yr ago, then med stopped-due to liver enzymes changes,occ. periods of weakness in arms"d.  [facility]"   Pneumonia    hx of , ? mild emphysema on CT scan 5'14, multiple pulmonary nodules   RSD (reflex sympathetic dystrophy) 06-16-13   legs   RSD (reflex sympathetic dystrophy) 2009   TIA (transient ischemic attack)    hx of (weakness left side)-none in 1 yr    Past Surgical History:  Procedure Laterality Date   ABDOMINAL HYSTERECTOMY     ANAL RECTAL MANOMETRY N/A 04/11/2015   Procedure: ANO RECTAL MANOMETRY;  Surgeon: WArta Silence MD;  Location: [facility];  Service: Endoscopy;  Laterality: N/A;   ANAL RECTAL MANOMETRY N/A 05/21/2017   Procedure: ANO RECTAL MANOMETRY;  Surgeon: OArta Silence MD;  Location: [facility];   Service: Endoscopy;  Laterality: N/A;   ANKLE SURGERY Right    no retained hardware   ANTERIOR CERVICAL DECOMP/DISCECTOMY FUSION  09/21/2012   Procedure: ANTERIOR CERVICAL DECOMPRESSION/DISCECTOMY FUSION 2 LEVELS;  Surgeon: JOtilio Connors MD;  [facility];  Service: Neurosurgery;  Laterality: N/A;  Cervical five-six, cervical seven-thoracic one Anterior cervical decompression/diskectomy/fusion/Lifenet bone/trestle plate/Prior Cervical six--seven Anterior cervical fusion/Removing trestle plate   ANTERIOR CERVICAL DECOMP/DISCECTOMY FUSION N/A 08/05/2018   Procedure: Cervical four-five Anterior cervical decompression/discectomy/fusion;  Surgeon: OJudith Part MD;  [facility]  Service: Neurosurgery;  Laterality: N/A;   APPENDECTOMY     CESAREAN SECTION     twice   COLONOSCOPY     ESOPHAGOGASTRODUODENOSCOPY (EGD) WITH PROPOFOL N/A 06/30/2013   Procedure: ESOPHAGOGASTRODUODENOSCOPY (EGD) WITH PROPOFOL;  Surgeon: WArta Silence MD;  Location: [facility];  Service: Endoscopy;  Laterality: N/A;   NECK SURGERY     WEDGE RESECTION     left ovary with cyst removed ,hx. multiple cysts removed prior    Family History  Problem Relation Age of Onset   Heart attack Mother 625      with CABG   Heart disease Mother    Hypertension Mother    Migraines Mother    Heart attack Father    Heart disease Father  Diabetes Mellitus II Father    Hypertension Father    Lupus Sister    Depression Sister    Migraines Sister    Renal Disease Brother    Diabetes Sister    Hypertension Sister    Migraines Sister    Migraines Sister    Healthy Sister    Healthy Sister    Healthy Sister    Healthy Brother    Dementia Maternal Grandfather    Colon cancer Maternal Grandmother    Heart disease Maternal Grandmother    Hypertension Maternal Grandmother    Healthy Son    Migraines Son     Social History   Tobacco Use   Smoking status: Some Days    Packs/day: 0.12    Years: 36.00     Total pack years: 4.32    Types: Cigarettes   Smokeless tobacco: Never   Tobacco comments:    0-2 per day  Vaping Use   Vaping Use: Never used  Substance Use Topics   Alcohol use: No   Drug use: No    Prior to Admission medications   Medication Sig Start Date End Date Taking? Authorizing Provider  buPROPion (WELLBUTRIN XL) 150 MG 24 hr tablet Take 1 tablet (150 mg total) by mouth daily. 11/20/22   Addison Lank, PA-C  clonazePAM (KLONOPIN) 0.5 MG tablet Take 1 tablet (0.5 mg total) by mouth every 6 (six) hours as needed for anxiety. 11/20/22   Donnal Moat T, PA-C  cloNIDine (CATAPRES) 0.1 MG tablet Take 0.1 mg by mouth 2 (two) times daily.    [provider]  clopidogrel (PLAVIX) 75 MG tablet TAKE 1 TABLET(75 MG) BY MOUTH DAILY 08/29/22   Sater, Nanine Means, MD  gabapentin (NEURONTIN) 800 MG tablet Take 800 mg by mouth 3 (three) times daily. 05/12/17   [provider]  hydrOXYzine (ATARAX) 25 MG tablet One po bid prn agitation or insomnia 12/17/22   Sater, Nanine Means, MD  methadone (DOLOPHINE) 5 MG tablet Take 1 tablet (5 mg total) by mouth every 12 (twelve) hours. 04/15/22   Geradine Girt, DO  naloxone [facility]) nasal spray 4 mg/0.1 mL Place 0.4 mg into the nose once as needed (opioid overdose). 04/02/22   [provider]  QUEtiapine (SEROQUEL) 300 MG tablet Take 1 tablet (300 mg total) by mouth at bedtime. 11/20/22   Donnal Moat T, PA-C  sertraline (ZOLOFT) 100 MG tablet TAKE 2 TABLETS(200 MG) BY MOUTH DAILY 10/09/22   Donnal Moat T, PA-C    Allergies Etodolac, Ambien [zolpidem tartrate], and Namenda [memantine]   REVIEW OF SYSTEMS  Negative except as noted here or in the History of Present Illness.   PHYSICAL EXAMINATION  Initial Vital Signs Blood pressure 137/71, pulse 72, temperature 98.2 F (36.8 C), temperature source Oral, resp. rate 18, height '5\' 2"'$  (1.575 m), weight 71 kg, last menstrual period 10/16/2011, SpO2 99 %.  Examination General:  Well-developed, well-nourished female in no acute distress; appearance consistent with age of record HENT: normocephalic; atraumatic Eyes: Normal appearance Neck: supple Heart: regular rate and rhythm Lungs: clear to auscultation bilaterally Abdomen: soft; nondistended; nontender; bowel sounds present GU: Left CVA tenderness Extremities: No deformity; full range of motion; pulses normal Neurologic: Awake, alert and oriented; motor function intact in all extremities and symmetric; no facial droop Skin: Warm and dry Psychiatric: Grimacing   RESULTS  Summary of this visit's results, reviewed and interpreted by myself:   EKG Interpretation  Date/Time:    Ventricular Rate:  PR Interval:    QRS Duration:   QT Interval:    QTC Calculation:   R Axis:     Text Interpretation:         Laboratory Studies: Results for orders placed or performed during the hospital encounter of 01/11/23 (from the past 24 hour(s))  CBC with Differential     Status: None   Collection Time: 01/11/23  2:27 AM  Result Value Ref Range   WBC 5.8 4.0 - 10.5 K/uL   RBC 4.58 3.87 - 5.11 MIL/uL   Hemoglobin 13.3 12.0 - 15.0 g/dL   HCT 38.2 36.0 - 46.0 %   MCV 83.4 80.0 - 100.0 fL   MCH 29.0 26.0 - 34.0 pg   MCHC 34.8 30.0 - 36.0 g/dL   RDW 12.6 11.5 - 15.5 %   Platelets 222 150 - 400 K/uL   nRBC 0.0 0.0 - 0.2 %   Neutrophils Relative % 56 %   Neutro Abs 3.3 1.7 - 7.7 K/uL   Lymphocytes Relative 33 %   Lymphs Abs 1.9 0.7 - 4.0 K/uL   Monocytes Relative 8 %   Monocytes Absolute 0.5 0.1 - 1.0 K/uL   Eosinophils Relative 2 %   Eosinophils Absolute 0.1 0.0 - 0.5 K/uL   Basophils Relative 1 %   Basophils Absolute 0.0 0.0 - 0.1 K/uL   Immature Granulocytes 0 %   Abs Immature Granulocytes 0.02 0.00 - 0.07 K/uL  Basic metabolic panel     Status: None   Collection Time: 01/11/23  2:27 AM  Result Value Ref Range   Sodium 137 135 - 145 mmol/L   Potassium 4.2 3.5 - 5.1 mmol/L   Chloride 104 98 - 111  mmol/L   CO2 27 22 - 32 mmol/L   Glucose, Bld 98 70 - 99 mg/dL   BUN 10 8 - 23 mg/dL   Creatinine, Ser 0.97 0.44 - 1.00 mg/dL   Calcium 8.9 8.9 - 10.3 mg/dL   GFR, Estimated >60 >60 mL/min   Anion gap 6 5 - 15  Urinalysis, w/ Reflex to Culture (Infection Suspected) -Urine, Clean Catch     Status: Abnormal   Collection Time: 01/11/23  3:08 AM  Result Value Ref Range   Specimen Source URINE, CLEAN CATCH    Color, Urine YELLOW YELLOW   APPearance CLEAR CLEAR   Specific Gravity, Urine 1.020 1.005 - 1.030   pH 5.5 5.0 - 8.0   Glucose, UA NEGATIVE NEGATIVE mg/dL   Hgb urine dipstick NEGATIVE NEGATIVE   Bilirubin Urine NEGATIVE NEGATIVE   Ketones, ur NEGATIVE NEGATIVE mg/dL   Protein, ur NEGATIVE NEGATIVE mg/dL   Nitrite NEGATIVE NEGATIVE   Leukocytes,Ua NEGATIVE NEGATIVE   Squamous Epithelial / HPF NONE SEEN 0 - 5 /HPF   WBC, UA NONE SEEN 0 - 5 WBC/hpf   RBC / HPF 0-5 0 - 5 RBC/hpf   Bacteria, UA RARE (A) NONE SEEN   Imaging Studies: CT Renal Stone Study  Result Date: 01/11/2023 CLINICAL DATA:  Left flank pain and low back pain EXAM: CT ABDOMEN AND PELVIS WITHOUT CONTRAST TECHNIQUE: Multidetector CT imaging of the abdomen and pelvis was performed following the standard protocol without IV contrast. RADIATION DOSE REDUCTION: This exam was performed according to the departmental dose-optimization program which includes automated exposure control, adjustment of the mA and/or kV according to patient size and/or use of iterative reconstruction technique. COMPARISON:  04/27/2020 FINDINGS: Lower chest: No acute abnormality Hepatobiliary: No focal hepatic abnormality. Gallbladder unremarkable. Pancreas:  No focal abnormality or ductal dilatation. Spleen: No focal abnormality.  Normal size. Adrenals/Urinary Tract: No adrenal abnormality. No focal renal abnormality. No stones or hydronephrosis. Urinary bladder is unremarkable. Stomach/Bowel: Moderate stool burden throughout the colon. Stomach, large and  small bowel grossly unremarkable. Vascular/Lymphatic: Aortic atherosclerosis. No evidence of aneurysm or adenopathy. Reproductive: Prior hysterectomy.  No adnexal masses. Other: No free fluid or free air. Musculoskeletal: No acute bony abnormality. IMPRESSION: No acute findings in the abdomen or pelvis. Aortic atherosclerosis. Moderate stool burden throughout the colon. Electronically Signed   By: Rolm Baptise M.D.   On: 01/11/2023 03:03    ED COURSE and MDM  Nursing notes, initial and subsequent vitals signs, including pulse oximetry, reviewed and interpreted by myself.  Vitals:   01/11/23 0101 01/11/23 0108  BP:  137/71  Pulse:  72  Resp:  18  Temp:  98.2 F (36.8 C)  TempSrc:  Oral  SpO2:  99%  Weight: 71 kg   Height: '5\' 2"'$  (1.575 m)    Medications  fentaNYL (SUBLIMAZE) injection 100 mcg (has no administration in time range)  0.9 %  sodium chloride infusion ( Intravenous New Bag/Given 01/11/23 0253)  ondansetron (ZOFRAN) injection 4 mg (4 mg Intravenous Given 01/11/23 0222)  fentaNYL (SUBLIMAZE) injection 100 mcg (100 mcg Intravenous Given 01/11/23 0223)   Patient had been unable to void in the ED despite having a full bladder on her CT scan.  An In-N-Out catheterization was performed and 300 mL of urine were obtained which do not show any evidence of urinary tract infection.  There is no evidence of hydronephrosis or kidney stone on the CT scan.  In the setting of back pain with difficulty voiding I am concerned about a spinal etiology, such as cord compression or cauda equina syndrome.  We will have [facility] transferred to [facility] for an emergent MRI.  Dr. Randal Buba is the accepting EDP.   PROCEDURES  Procedures   ED DIAGNOSES     ICD-10-CM   1. Acute left-sided low back pain without sciatica  M54.50     2. Urinary retention  R33.9          Chante Mayson, Jenny Reichmann, MD 01/11/23 [phone]

## 2023-01-17 DIAGNOSIS — G4700 Insomnia, unspecified: Secondary | ICD-10-CM | POA: Diagnosis not present

## 2023-01-17 DIAGNOSIS — I2510 Atherosclerotic heart disease of native coronary artery without angina pectoris: Secondary | ICD-10-CM | POA: Diagnosis not present

## 2023-01-17 DIAGNOSIS — I10 Essential (primary) hypertension: Secondary | ICD-10-CM | POA: Diagnosis not present

## 2023-01-17 DIAGNOSIS — G9050 Complex regional pain syndrome I, unspecified: Secondary | ICD-10-CM | POA: Diagnosis not present

## 2023-02-11 ENCOUNTER — Other Ambulatory Visit: Payer: Self-pay | Admitting: Physician Assistant

## 2023-02-19 ENCOUNTER — Ambulatory Visit (INDEPENDENT_AMBULATORY_CARE_PROVIDER_SITE_OTHER): Payer: Self-pay | Admitting: Physician Assistant

## 2023-02-19 DIAGNOSIS — Z91199 Patient's noncompliance with other medical treatment and regimen due to unspecified reason: Secondary | ICD-10-CM

## 2023-02-19 NOTE — Progress Notes (Signed)
No show

## 2023-02-23 ENCOUNTER — Other Ambulatory Visit: Payer: Self-pay | Admitting: Acute Care

## 2023-02-23 DIAGNOSIS — Z87891 Personal history of nicotine dependence: Secondary | ICD-10-CM

## 2023-02-23 DIAGNOSIS — F17210 Nicotine dependence, cigarettes, uncomplicated: Secondary | ICD-10-CM

## 2023-02-23 DIAGNOSIS — Z122 Encounter for screening for malignant neoplasm of respiratory organs: Secondary | ICD-10-CM

## 2023-02-24 ENCOUNTER — Other Ambulatory Visit: Payer: Self-pay | Admitting: Nurse Practitioner

## 2023-02-24 DIAGNOSIS — K7469 Other cirrhosis of liver: Secondary | ICD-10-CM

## 2023-02-25 DIAGNOSIS — J432 Centrilobular emphysema: Secondary | ICD-10-CM | POA: Diagnosis not present

## 2023-02-25 DIAGNOSIS — G9050 Complex regional pain syndrome I, unspecified: Secondary | ICD-10-CM | POA: Diagnosis not present

## 2023-02-25 DIAGNOSIS — Z8619 Personal history of other infectious and parasitic diseases: Secondary | ICD-10-CM | POA: Diagnosis not present

## 2023-02-25 DIAGNOSIS — G4700 Insomnia, unspecified: Secondary | ICD-10-CM | POA: Diagnosis not present

## 2023-02-25 DIAGNOSIS — K7460 Unspecified cirrhosis of liver: Secondary | ICD-10-CM | POA: Diagnosis not present

## 2023-02-25 DIAGNOSIS — I10 Essential (primary) hypertension: Secondary | ICD-10-CM | POA: Diagnosis not present

## 2023-02-25 DIAGNOSIS — I2510 Atherosclerotic heart disease of native coronary artery without angina pectoris: Secondary | ICD-10-CM | POA: Diagnosis not present

## 2023-02-25 DIAGNOSIS — G43109 Migraine with aura, not intractable, without status migrainosus: Secondary | ICD-10-CM | POA: Diagnosis not present

## 2023-02-25 DIAGNOSIS — Z0000 Encounter for general adult medical examination without abnormal findings: Secondary | ICD-10-CM | POA: Diagnosis not present

## 2023-03-04 ENCOUNTER — Telehealth: Payer: Self-pay | Admitting: Physician Assistant

## 2023-03-04 NOTE — Telephone Encounter (Signed)
Pt's husband Windy Fast LVM asking provider to RTC [phone]

## 2023-03-05 DIAGNOSIS — Z5181 Encounter for therapeutic drug level monitoring: Secondary | ICD-10-CM | POA: Diagnosis not present

## 2023-03-05 DIAGNOSIS — M961 Postlaminectomy syndrome, not elsewhere classified: Secondary | ICD-10-CM | POA: Diagnosis not present

## 2023-03-05 DIAGNOSIS — M5412 Radiculopathy, cervical region: Secondary | ICD-10-CM | POA: Diagnosis not present

## 2023-03-05 DIAGNOSIS — G90522 Complex regional pain syndrome I of left lower limb: Secondary | ICD-10-CM | POA: Diagnosis not present

## 2023-03-05 DIAGNOSIS — Z79899 Other long term (current) drug therapy: Secondary | ICD-10-CM | POA: Diagnosis not present

## 2023-03-05 DIAGNOSIS — G894 Chronic pain syndrome: Secondary | ICD-10-CM | POA: Diagnosis not present

## 2023-03-13 NOTE — Telephone Encounter (Signed)
I spoke w/ husband. He asks that I not dismiss from the practice. He's aware she's missed several appts, he states he'll keep up with appts from now on. She's more forgetful, feels like people are coming in the house, stealing things when he knows that's not true. He doesn't think she's taking the Seroquel as directed. Under the circumstances, I will continue her care, but she must keep appts in the future. She needs to take the AP as directed, and I recommend he get an appt w/ Dr. Epimenio Foot, neurologist to eval memory loss. I will send this note to Napoleon Form, Print production planner, to revoke the dismissal from the practice. And ask that staff call husband at the above # to make an appt, preferably morning.

## 2023-03-14 NOTE — Telephone Encounter (Signed)
Noted  

## 2023-03-14 NOTE — Telephone Encounter (Signed)
Appt made with husband for May 8

## 2023-03-17 ENCOUNTER — Telehealth: Payer: Self-pay | Admitting: Neurology
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
## 2023-03-18 NOTE — Telephone Encounter (Signed)
Called and spoke w/ husband. Relayed Dr. Bonnita Hollow recommendation. He states he called and made her appt tomorrow with her psychiatrist at [facility]. She does not let him go back with her at appt but will discuss with her tomorrow about being more involved if she approves so he is aware POC.  I also provided info for [facility]: Address: [address], [place], [place] [address] Hours: Open 24 hours Phone: [phone]

## 2023-03-19 ENCOUNTER — Ambulatory Visit (INDEPENDENT_AMBULATORY_CARE_PROVIDER_SITE_OTHER): Payer: Medicare Other | Admitting: Physician Assistant

## 2023-03-19 ENCOUNTER — Encounter: Payer: Self-pay | Admitting: Physician Assistant

## 2023-03-19 DIAGNOSIS — F411 Generalized anxiety disorder: Secondary | ICD-10-CM | POA: Diagnosis not present

## 2023-03-19 DIAGNOSIS — F5105 Insomnia due to other mental disorder: Secondary | ICD-10-CM | POA: Diagnosis not present

## 2023-03-19 DIAGNOSIS — F4310 Post-traumatic stress disorder, unspecified: Secondary | ICD-10-CM | POA: Diagnosis not present

## 2023-03-19 DIAGNOSIS — F99 Mental disorder, not otherwise specified: Secondary | ICD-10-CM

## 2023-03-19 DIAGNOSIS — F22 Delusional disorders: Secondary | ICD-10-CM

## 2023-03-19 DIAGNOSIS — Z6379 Other stressful life events affecting family and household: Secondary | ICD-10-CM

## 2023-03-19 DIAGNOSIS — F3341 Major depressive disorder, recurrent, in partial remission: Secondary | ICD-10-CM

## 2023-03-19 DIAGNOSIS — R443 Hallucinations, unspecified: Secondary | ICD-10-CM

## 2023-03-19 MED ORDER — BUPROPION HCL ER (XL) 150 MG PO TB24: 150.0000 mg | ORAL_TABLET | Freq: Every day | ORAL | 1 refills | Status: DC

## 2023-03-19 MED ORDER — SERTRALINE HCL 100 MG PO TABS: ORAL_TABLET | ORAL | 1 refills | Status: DC

## 2023-03-19 MED ORDER — QUETIAPINE FUMARATE 300 MG PO TABS: 600.0000 mg | ORAL_TABLET | Freq: Every day | ORAL | 1 refills | Status: DC

## 2023-03-19 NOTE — Patient Instructions (Signed)
Increase the Seroquel 300 mg to 2 pills every night (from 1 pill.) Continue all other medications as usual.
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
## 2023-03-24 ENCOUNTER — Ambulatory Visit (HOSPITAL_COMMUNITY): Payer: Medicare Other

## 2023-04-08 ENCOUNTER — Other Ambulatory Visit: Payer: Self-pay | Admitting: Physician Assistant

## 2023-04-09 DIAGNOSIS — M25511 Pain in right shoulder: Secondary | ICD-10-CM | POA: Diagnosis not present

## 2023-04-09 DIAGNOSIS — M25521 Pain in right elbow: Secondary | ICD-10-CM | POA: Diagnosis not present

## 2023-04-16 ENCOUNTER — Ambulatory Visit: Payer: Medicare Other | Admitting: Physician Assistant

## 2023-04-18 ENCOUNTER — Ambulatory Visit (INDEPENDENT_AMBULATORY_CARE_PROVIDER_SITE_OTHER): Payer: Medicare Other | Admitting: Physician Assistant

## 2023-04-18 ENCOUNTER — Encounter: Payer: Self-pay | Admitting: Physician Assistant

## 2023-04-18 DIAGNOSIS — F4310 Post-traumatic stress disorder, unspecified: Secondary | ICD-10-CM | POA: Diagnosis not present

## 2023-04-18 DIAGNOSIS — Z6379 Other stressful life events affecting family and household: Secondary | ICD-10-CM

## 2023-04-18 DIAGNOSIS — R443 Hallucinations, unspecified: Secondary | ICD-10-CM

## 2023-04-18 DIAGNOSIS — F5105 Insomnia due to other mental disorder: Secondary | ICD-10-CM

## 2023-04-18 DIAGNOSIS — F22 Delusional disorders: Secondary | ICD-10-CM | POA: Diagnosis not present

## 2023-04-18 DIAGNOSIS — F411 Generalized anxiety disorder: Secondary | ICD-10-CM | POA: Diagnosis not present

## 2023-04-18 DIAGNOSIS — F3341 Major depressive disorder, recurrent, in partial remission: Secondary | ICD-10-CM

## 2023-04-18 DIAGNOSIS — F99 Mental disorder, not otherwise specified: Secondary | ICD-10-CM

## 2023-04-18 MED ORDER — QUETIAPINE FUMARATE ER 400 MG PO TB24: 800.0000 mg | ORAL_TABLET | Freq: Every day | ORAL | 1 refills | Status: DC
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
## 2023-04-25 ENCOUNTER — Other Ambulatory Visit: Payer: Medicare Other

## 2023-04-30 ENCOUNTER — Other Ambulatory Visit: Payer: Self-pay | Admitting: Physician Assistant

## 2023-05-06 ENCOUNTER — Other Ambulatory Visit: Payer: Medicare Other

## 2023-05-09 ENCOUNTER — Encounter (HOSPITAL_BASED_OUTPATIENT_CLINIC_OR_DEPARTMENT_OTHER): Payer: Self-pay

## 2023-05-09 ENCOUNTER — Emergency Department (HOSPITAL_BASED_OUTPATIENT_CLINIC_OR_DEPARTMENT_OTHER): Payer: Medicare Other

## 2023-05-09 ENCOUNTER — Other Ambulatory Visit: Payer: Self-pay

## 2023-05-09 ENCOUNTER — Emergency Department (HOSPITAL_BASED_OUTPATIENT_CLINIC_OR_DEPARTMENT_OTHER)
Admission: EM | Admit: 2023-05-09 | Discharge: 2023-05-10 | Discharge status: Against Medical Advice | Payer: Medicare Other | Attending: Emergency Medicine | Admitting: Emergency Medicine

## 2023-05-09 DIAGNOSIS — S40022A Contusion of left upper arm, initial encounter: Secondary | ICD-10-CM | POA: Diagnosis not present

## 2023-05-09 DIAGNOSIS — W19XXXA Unspecified fall, initial encounter: Secondary | ICD-10-CM | POA: Diagnosis not present

## 2023-05-09 DIAGNOSIS — M79632 Pain in left forearm: Secondary | ICD-10-CM | POA: Insufficient documentation

## 2023-05-09 DIAGNOSIS — M1812 Unilateral primary osteoarthritis of first carpometacarpal joint, left hand: Secondary | ICD-10-CM | POA: Diagnosis not present

## 2023-05-09 DIAGNOSIS — S59912A Unspecified injury of left forearm, initial encounter: Secondary | ICD-10-CM | POA: Diagnosis present

## 2023-05-09 DIAGNOSIS — Z1152 Encounter for screening for COVID-19: Secondary | ICD-10-CM | POA: Diagnosis not present

## 2023-05-09 DIAGNOSIS — S4992XA Unspecified injury of left shoulder and upper arm, initial encounter: Secondary | ICD-10-CM | POA: Diagnosis not present

## 2023-05-09 DIAGNOSIS — M25512 Pain in left shoulder: Secondary | ICD-10-CM | POA: Diagnosis not present

## 2023-05-09 DIAGNOSIS — R296 Repeated falls: Secondary | ICD-10-CM | POA: Insufficient documentation

## 2023-05-09 DIAGNOSIS — M79643 Pain in unspecified hand: Secondary | ICD-10-CM

## 2023-05-09 DIAGNOSIS — Z043 Encounter for examination and observation following other accident: Secondary | ICD-10-CM | POA: Diagnosis not present

## 2023-05-09 DIAGNOSIS — M25531 Pain in right wrist: Secondary | ICD-10-CM | POA: Diagnosis not present

## 2023-05-09 DIAGNOSIS — M79642 Pain in left hand: Secondary | ICD-10-CM | POA: Insufficient documentation

## 2023-05-09 DIAGNOSIS — R918 Other nonspecific abnormal finding of lung field: Secondary | ICD-10-CM | POA: Diagnosis not present

## 2023-05-09 DIAGNOSIS — R9431 Abnormal electrocardiogram [ECG] [EKG]: Secondary | ICD-10-CM | POA: Diagnosis not present

## 2023-05-09 DIAGNOSIS — S40012A Contusion of left shoulder, initial encounter: Secondary | ICD-10-CM | POA: Diagnosis not present

## 2023-05-09 DIAGNOSIS — R059 Cough, unspecified: Secondary | ICD-10-CM | POA: Diagnosis not present

## 2023-05-09 DIAGNOSIS — J209 Acute bronchitis, unspecified: Secondary | ICD-10-CM | POA: Diagnosis not present

## 2023-05-09 DIAGNOSIS — R531 Weakness: Secondary | ICD-10-CM | POA: Diagnosis not present

## 2023-05-09 DIAGNOSIS — J40 Bronchitis, not specified as acute or chronic: Secondary | ICD-10-CM

## 2023-05-09 LAB — RESP PANEL BY RT-PCR (RSV, FLU A&B, COVID)  RVPGX2
Influenza A by PCR: NEGATIVE
Influenza B by PCR: NEGATIVE
Resp Syncytial Virus by PCR: NEGATIVE
SARS Coronavirus 2 by RT PCR: NEGATIVE

## 2023-05-09 LAB — CBC WITH DIFFERENTIAL/PLATELET
Abs Immature Granulocytes: 0.01 10*3/uL (ref 0.00–0.07)
Basophils Absolute: 0 10*3/uL (ref 0.0–0.1)
Basophils Relative: 1 %
Eosinophils Absolute: 0.2 10*3/uL (ref 0.0–0.5)
Eosinophils Relative: 3 %
HCT: 37 % (ref 36.0–46.0)
Hemoglobin: 12.7 g/dL (ref 12.0–15.0)
Immature Granulocytes: 0 %
Lymphocytes Relative: 44 %
Lymphs Abs: 2.2 10*3/uL (ref 0.7–4.0)
MCH: 28.9 pg (ref 26.0–34.0)
MCHC: 34.3 g/dL (ref 30.0–36.0)
MCV: 84.3 fL (ref 80.0–100.0)
Monocytes Absolute: 0.3 10*3/uL (ref 0.1–1.0)
Monocytes Relative: 6 %
Neutro Abs: 2.3 10*3/uL (ref 1.7–7.7)
Neutrophils Relative %: 46 %
Platelets: 223 10*3/uL (ref 150–400)
RBC: 4.39 MIL/uL (ref 3.87–5.11)
RDW: 13.3 % (ref 11.5–15.5)
WBC: 5 10*3/uL (ref 4.0–10.5)
nRBC: 0 % (ref 0.0–0.2)

## 2023-05-09 LAB — BASIC METABOLIC PANEL
Anion gap: 9 (ref 5–15)
BUN: 12 mg/dL (ref 8–23)
CO2: 22 mmol/L (ref 22–32)
Calcium: 8.9 mg/dL (ref 8.9–10.3)
Chloride: 107 mmol/L (ref 98–111)
Creatinine, Ser: 0.9 mg/dL (ref 0.44–1.00)
GFR, Estimated: >60 mL/min (ref >60)
Glucose, Bld: 92 mg/dL (ref 70–99)
Potassium: 3.7 mmol/L (ref 3.5–5.1)
Sodium: 138 mmol/L (ref 135–145)

## 2023-05-09 MED ORDER — OXYCODONE HCL 5 MG PO TABS
5.0000 mg | ORAL_TABLET | Freq: Once | ORAL | Status: AC
  Administered 2023-05-09: 5 mg via ORAL
  Filled 2023-05-09: qty 1

## 2023-05-09 MED ORDER — ACETAMINOPHEN 500 MG PO TABS
1000.0000 mg | ORAL_TABLET | Freq: Once | ORAL | Status: AC
  Administered 2023-05-09: 1000 mg via ORAL
  Filled 2023-05-09: qty 2

## 2023-05-09 NOTE — ED Notes (Signed)
Patient transported to X-ray 

## 2023-05-09 NOTE — ED Provider Notes (Signed)
[facility] Provider Note   CSN: [id number] Arrival date & time: 05/09/23  1720     History {Add pertinent medical, surgical, social history, OB history to HPI:1} Chief Complaint  Patient presents with   Shoulder Injury   Fall    SHAKEYLA TOKI is a 62 y.o. female with relapsing remitting MS, history DVT, tobacco abuse, chronic pain disorder, cervical myelopathy, history of hep C, history of left middle ear effusion who presents with L shoulder injury/falls.   Patient presents with mechanical fall 2 days ago hitting her left shoulder.  Also fell again yesterday and hit her left forearm.  Significant pain to both areas with ecchymosis and swelling.  States that she feels like her left thumb is intermittently dislocating and she has to pull it out to put it back in place.  She denies any numbness tingling.  Endorses generalized weakness, denies any asymmetric weakness but states that her balance she feels like has been off.  She does not know why she is falling and also has had a productive cough with green mucus for 2 weeks.  Denies any fevers chills, chest pain, shortness of breath, abdominal pain, nausea vomiting diarrhea constipation, urinary symptoms, vaginal symptoms, lower extreme edema.  Denies any recent changes to her medications.  Does not take blood thinners.  When she fell 2 days ago she did hit her head but did not lose consciousness and denies any headache or neck pain now.   Shoulder Injury  Fall       Home Medications Prior to Admission medications   Medication Sig Start Date End Date Taking? Authorizing Provider  buPROPion (WELLBUTRIN XL) 150 MG 24 hr tablet Take 1 tablet (150 mg total) by mouth daily. 03/19/23   Melony Overly T, PA-C  clonazePAM (KLONOPIN) 0.5 MG tablet TAKE 1 TABLET(0.5 MG) BY MOUTH EVERY 6 HOURS AS NEEDED FOR ANXIETY 04/09/23   Melony Overly T, PA-C  cloNIDine (CATAPRES) 0.1 MG tablet Take 0.1 mg by  mouth 2 (two) times daily.    [provider]  clopidogrel (PLAVIX) 75 MG tablet TAKE 1 TABLET(75 MG) BY MOUTH DAILY 08/29/22   Sater, Pearletha Furl, MD  gabapentin (NEURONTIN) 800 MG tablet Take 800 mg by mouth 3 (three) times daily. 05/12/17   [provider]  hydrOXYzine (ATARAX) 25 MG tablet One po bid prn agitation or insomnia 12/17/22   Sater, Pearletha Furl, MD  methadone (DOLOPHINE) 5 MG tablet Take 1 tablet (5 mg total) by mouth every 12 (twelve) hours. 04/15/22   Joseph Art, DO  naloxone [facility]) nasal spray 4 mg/0.1 mL Place 0.4 mg into the nose once as needed (opioid overdose). Patient not taking: Reported on 03/19/2023 04/02/22   [provider]  QUEtiapine (SEROQUEL XR) 400 MG 24 hr tablet Take 2 tablets (800 mg total) by mouth at bedtime. 04/18/23   Melony Overly T, PA-C  sertraline (ZOLOFT) 100 MG tablet TAKE 2 TABLETS(200 MG) BY MOUTH DAILY 04/30/23   Melony Overly T, PA-C      Allergies    Etodolac, Ambien [zolpidem tartrate], and Namenda [memantine]    Review of Systems   Review of Systems A 10 point review of systems was performed and is negative unless otherwise reported in HPI.  Physical Exam Updated Vital Signs BP 130/68 (BP Location: Right Arm)   Pulse 74   Temp 98.5 F (36.9 C) (Oral)   Resp 17   Ht 5\' 2"  (1.575 m)  Wt 71 kg   LMP 10/16/2011   SpO2 95%   BMI 28.63 kg/m  Physical Exam General: Normal appearing female, lying in bed.  HEENT: PERRLA, EOMI, Sclera anicteric, MMM, trachea midline. Normal speech. NCAT.  Cardiology: RRR, no murmurs/rubs/gallops. BL radial and DP pulses equal bilaterally.  Resp: Normal respiratory rate and effort. CTAB, no wheezes, rhonchi, crackles.  Abd: Soft, non-tender, non-distended. No rebound tenderness or guarding.  GU: Deferred. MSK: Ecchymosis with swelling to the left anterior proximal humerus without any bony deformities and full range of motion of the shoulder and elbow.  Swelling and tenderness palpation  of the proximal left forearm as well without any bony deformities.  Intact radial pulse of the left upper extremity.  Intact range of motion of hand.  Positive tenderness at the left anatomic snuffbox.  While examining patient's hand, she demonstrates that she unwittingly sometimes dislocates the left DIP which is then dorsally dislocated. She reduces it by pulling on it. Intact cap refill. Compartments soft.  Skin: warm, dry.  Back: No C-spine TTP, deformities/stepoffs.  Neuro: A&Ox4, CNs II-XII grossly intact. 5/5 strength all extremities. Sensation grossly intact.  Psych: Normal mood and affect.   ED Results / Procedures / Treatments   Labs (all labs ordered are listed, but only abnormal results are displayed) Labs Reviewed  RESP PANEL BY RT-PCR (RSV, FLU A&B, COVID)  [id number]  CBC WITH DIFFERENTIAL/PLATELET  BASIC METABOLIC PANEL  URINALYSIS, ROUTINE W REFLEX MICROSCOPIC    EKG None  Radiology DG Hand Complete Left  Result Date: 05/09/2023 CLINICAL DATA:  Multiple falls, left hand pain, thumb pain EXAM: LEFT HAND - COMPLETE 3+ VIEW COMPARISON:  None Available. FINDINGS: Frontal, oblique, and lateral views of the left hand are obtained on 4 images. No acute fracture, subluxation, or dislocation. There is mild osteoarthritis at the first carpometacarpal joint. Remaining joint spaces are relatively well preserved. Soft tissues are unremarkable. IMPRESSION: 1. Mild osteoarthritis at the base of the thumb. 2. No acute displaced fracture. Electronically Signed   By: Sharlet Salina M.D.   On: 05/09/2023 18:34   DG Shoulder Left  Result Date: 05/09/2023 CLINICAL DATA:  Left shoulder pain, multiple falls, bruising EXAM: LEFT SHOULDER - 2+ VIEW COMPARISON:  None Available. FINDINGS: Internal rotation, external rotation, and transscapular views of the left shoulder are obtained. No acute fracture, subluxation, or dislocation. Mild hypertrophic changes of the acromioclavicular joint. Glenohumeral  joint is well preserved. Soft tissues are unremarkable. Left chest is clear. IMPRESSION: 1. Hypertrophic changes left acromioclavicular joint. No acute fracture. Electronically Signed   By: Sharlet Salina M.D.   On: 05/09/2023 18:31    Procedures Procedures  {Document cardiac monitor, telemetry assessment procedure when appropriate:1}  Medications Ordered in ED Medications  acetaminophen (TYLENOL) tablet 1,000 mg (1,000 mg Oral Given 05/09/23 2130)  oxyCODONE (Oxy IR/ROXICODONE) immediate release tablet 5 mg (5 mg Oral Given 05/09/23 2130)    ED Course/ Medical Decision Making/ A&P                          Medical Decision Making Amount and/or Complexity of Data Reviewed Labs: ordered. Decision-making details documented in ED Course. Radiology: ordered.  Risk OTC drugs. Prescription drug management.    This patient presents to the ED for concern of ***, this involves an extensive number of treatment options, and is a complaint that carries with it a high risk of complications and morbidity.  I considered the following differential and  admission for this acute, potentially life threatening condition.   MDM:    Per chart review, patient has a relapsing remitting multiple sclerosis.  This is concerning for a possible MS flare.  Though she has no focal neurodeficits on exam, just generalized weakness though she does feel off balance.  She did hit her head when she fell but did not lose consciousness and it has been greater than 24 hours since the fall with no new focal neurodeficits, altered mental status, nausea vomiting, or headache, do not believe patient requires CT head at this time.  Consider cause of generalized weakness including anemia, infection especially such as upper respiratory viral infection or pneumonia given her report of productive cough, UTI; electrolyte derangements, hypo-/hyperglycemia.  Clinical Course as of 05/09/23 2220  Fri May 09, 2023  2200 CBC with  Differential wnl [HN]    Clinical Course User Index [HN] Loetta Rough, MD    Labs: I Ordered, and personally interpreted labs.  The pertinent results include:  ***  Imaging Studies ordered: I ordered imaging studies including *** I independently visualized and interpreted imaging. I agree with the radiologist interpretation  Additional history obtained from ***.  External records from outside source obtained and reviewed including ***  Cardiac Monitoring: The patient was maintained on a cardiac monitor.  I personally viewed and interpreted the cardiac monitored which showed an underlying rhythm of: ***  Reevaluation: After the interventions noted above, I reevaluated the patient and found that they have :{resolved/improved/worsened:23923::"improved"}  Social Determinants of Health: ***  Disposition:  ***  Co morbidities that complicate the patient evaluation  Past Medical History:  Diagnosis Date   Anxiety 06-16-13   hx. panic attacks, none recent   Blood transfusion without reported diagnosis    20 yrs ago after childbirth   Bronchitis    hx of   Chronic neck pain    Constipation    Depression    DVT (deep venous thrombosis) (HCC) 2015   rt. leg    Headache(784.0)    migraines-has decreased   Heart murmur    Dr. Dione Housekeeper   Hepatitis C    dx. Hep. C(s/p transfusion age 67) low Hgb  .-multiple transfusions. Treated successfully   Multiple sclerosis (HCC)    dx. -47yrs ago, then med stopped-due to liver enzymes changes,occ. periods of weakness in arms"d.  was seen at [facility] and tolded that she does not have MSrops things"   Pneumonia    hx of , ? mild emphysema on CT scan 5'14, multiple pulmonary nodules   RSD (reflex sympathetic dystrophy) 06-16-13   legs   RSD (reflex sympathetic dystrophy) 2009   TIA (transient ischemic attack)    hx of (weakness left side)-none in 1 yr     Medicines Meds ordered this encounter  Medications   acetaminophen (TYLENOL)  tablet 1,000 mg   oxyCODONE (Oxy IR/ROXICODONE) immediate release tablet 5 mg    I have reviewed the patients home medicines and have made adjustments as needed  Problem List / ED Course: Problem List Items Addressed This Visit   None        {Document critical care time when appropriate:1} {Document review of labs and clinical decision tools ie heart score, Chads2Vasc2 etc:1}  {Document your independent review of radiology images, and any outside records:1} {Document your discussion with family members, caretakers, and with consultants:1} {Document social determinants of health affecting pt's care:1} {Document your decision making why or why not admission, treatments were needed:1}  This note was created using dictation software, which may contain spelling or grammatical errors.

## 2023-05-09 NOTE — ED Notes (Signed)
[facility] called for transport. 

## 2023-05-09 NOTE — Discharge Instructions (Addendum)
Please call Dr. Frazier Butt to follow up about your hand/wrist pain. Please wear the thumb spica splint until you follow up in clinic.

## 2023-05-09 NOTE — ED Triage Notes (Signed)
Patient had mechanical fall two days ago. Bruising and pain to left shoulder and arm. She has had multiple falls recently.  She has hit her head but does not take a blood thinner.

## 2023-05-10 NOTE — ED Notes (Signed)
ED TO INPATIENT HANDOFF REPORT  ED Nurse Name and Phone #: Dominga Ferry, [place]- Paramedic [phone]  S Name/Age/Gender Becky Wolfe 62 y.o. female Room/Bed: MH12/MH12  Code Status   Code Status: Prior  Home/SNF/Other Home Patient oriented to: self, place, time, and situation Is this baseline? Yes   Triage Complete: Triage complete  Chief Complaint Injured left arm  Triage Note Patient had mechanical fall two days ago. Bruising and pain to left shoulder and arm. She has had multiple falls recently.  She has hit her head but does not take a blood thinner.    Allergies Allergies  Allergen Reactions   Etodolac Swelling and Other (See Comments)    Facial swelling    Ambien [Zolpidem Tartrate] Other (See Comments)    Headaches, memory loss    Namenda [Memantine] Other (See Comments)    Headaches     Level of Care/Admitting Diagnosis ED Disposition     ED Disposition  Transfer via Transport   Condition  --   Comment  The patient appears reasonably stabilized for transfer considering the current resources, flow, and capabilities available in the ED at this time, and I doubt any other [facility] requiring further screening and/or treatment in the ED prior to transfer is p resent.          B Medical/Surgery History Past Medical History:  Diagnosis Date   Anxiety 06-16-13   hx. panic attacks, none recent   Blood transfusion without reported diagnosis    20 yrs ago after childbirth   Bronchitis    hx of   Chronic neck pain    Constipation    Depression    DVT (deep venous thrombosis) (HCC) 2015   rt. leg    Headache(784.0)    migraines-has decreased   Heart murmur    Dr. Dione Housekeeper   Hepatitis C    dx. Hep. C(s/p transfusion age 58) low Hgb  .-multiple transfusions. Treated successfully   Multiple sclerosis (HCC)    dx. -49yrs ago, then med stopped-due to liver enzymes changes,occ. periods of weakness in arms"d.  was seen at [facility] and tolded that she does not  have MSrops things"   Pneumonia    hx of , ? mild emphysema on CT scan 5'14, multiple pulmonary nodules   RSD (reflex sympathetic dystrophy) 06-16-13   legs   RSD (reflex sympathetic dystrophy) 2009   TIA (transient ischemic attack)    hx of (weakness left side)-none in 1 yr   Past Surgical History:  Procedure Laterality Date   ABDOMINAL HYSTERECTOMY     ANAL RECTAL MANOMETRY N/A 04/11/2015   Procedure: ANO RECTAL MANOMETRY;  Surgeon: Willis Modena, MD;  Location: [facility];  Service: Endoscopy;  Laterality: N/A;   ANAL RECTAL MANOMETRY N/A 05/21/2017   Procedure: ANO RECTAL MANOMETRY;  Surgeon: Willis Modena, MD;  Location: [facility];  Service: Endoscopy;  Laterality: N/A;   ANKLE SURGERY Right    no retained hardware   ANTERIOR CERVICAL DECOMP/DISCECTOMY FUSION  09/21/2012   Procedure: ANTERIOR CERVICAL DECOMPRESSION/DISCECTOMY FUSION 2 LEVELS;  Surgeon: Clydene Fake, MD;  Location: [facility];  Service: Neurosurgery;  Laterality: N/A;  Cervical five-six, cervical seven-thoracic one Anterior cervical decompression/diskectomy/fusion/Lifenet bone/trestle plate/Prior Cervical six--seven Anterior cervical fusion/Removing trestle plate   ANTERIOR CERVICAL DECOMP/DISCECTOMY FUSION N/A 08/05/2018   Procedure: Cervical four-five Anterior cervical decompression/discectomy/fusion;  Surgeon: Jadene Pierini, MD;  Location: [facility] OR;  Service: Neurosurgery;  Laterality: N/A;   APPENDECTOMY     CESAREAN SECTION  twice   COLONOSCOPY     ESOPHAGOGASTRODUODENOSCOPY (EGD) WITH PROPOFOL N/A 06/30/2013   Procedure: ESOPHAGOGASTRODUODENOSCOPY (EGD) WITH PROPOFOL;  Surgeon: Willis Modena, MD;  Location: [facility];  Service: Endoscopy;  Laterality: N/A;   NECK SURGERY     WEDGE RESECTION     left ovary with cyst removed ,hx. multiple cysts removed prior     A IV Location/Drains/Wounds Patient Lines/Drains/Airways Status     Active Line/Drains/Airways     None             Intake/Output Last 24 hours No intake or output data in the 24 hours ending 05/10/23 0018  Labs/Imaging Results for orders placed or performed during the hospital encounter of 05/09/23 (from the past 48 hour(s))  CBC with Differential     Status: None   Collection Time: 05/09/23  9:47 PM  Result Value Ref Range   WBC 5.0 4.0 - 10.5 K/uL   RBC 4.39 3.87 - 5.11 MIL/uL   Hemoglobin 12.7 12.0 - 15.0 g/dL   HCT 40.9 81.1 - 91.4 %   MCV 84.3 80.0 - 100.0 fL   MCH 28.9 26.0 - 34.0 pg   MCHC 34.3 30.0 - 36.0 g/dL   RDW 78.2 95.6 - 21.3 %   Platelets 223 150 - 400 K/uL   nRBC 0.0 0.0 - 0.2 %   Neutrophils Relative % 46 %   Neutro Abs 2.3 1.7 - 7.7 K/uL   Lymphocytes Relative 44 %   Lymphs Abs 2.2 0.7 - 4.0 K/uL   Monocytes Relative 6 %   Monocytes Absolute 0.3 0.1 - 1.0 K/uL   Eosinophils Relative 3 %   Eosinophils Absolute 0.2 0.0 - 0.5 K/uL   Basophils Relative 1 %   Basophils Absolute 0.0 0.0 - 0.1 K/uL   Immature Granulocytes 0 %   Abs Immature Granulocytes 0.01 0.00 - 0.07 K/uL    Comment: Performed at [facility], [address] [place], [place] [address]  Basic metabolic panel     Status: None   Collection Time: 05/09/23  9:47 PM  Result Value Ref Range   Sodium 138 135 - 145 mmol/L   Potassium 3.7 3.5 - 5.1 mmol/L   Chloride 107 98 - 111 mmol/L   CO2 22 22 - 32 mmol/L   Glucose, Bld 92 70 - 99 mg/dL    Comment: Glucose reference range applies only to samples taken after fasting for at least 8 hours.   BUN 12 8 - 23 mg/dL   Creatinine, Ser 8.46 0.44 - 1.00 mg/dL   Calcium 8.9 8.9 - 96.2 mg/dL   GFR, Estimated >95 >28 mL/min    Comment: (NOTE) Calculated using the CKD-EPI Creatinine Equation (2021)    Anion gap 9 5 - 15    Comment: Performed at [facility], [address] [place], [place] [address]  Resp panel by RT-PCR (RSV, Flu A&B, Covid) Anterior Nasal Swab     Status: None   Collection Time: 05/09/23  9:48 PM   Specimen: Anterior  Nasal Swab  Result Value Ref Range   SARS Coronavirus 2 by RT PCR NEGATIVE NEGATIVE    Comment: (NOTE) SARS-CoV-2 target nucleic acids are NOT DETECTED.  The SARS-CoV-2 RNA is generally detectable in upper respiratory specimens during the acute phase of infection. The lowest concentration of SARS-CoV-2 viral copies this assay can detect is 138 copies/mL. A negative result does not preclude SARS-Cov-2 infection and should not be used as  the sole basis for treatment or other patient management decisions. A negative result may occur with  improper specimen collection/handling, submission of specimen other than nasopharyngeal swab, presence of viral mutation(s) within the areas targeted by this assay, and inadequate number of viral copies(<138 copies/mL). A negative result must be combined with clinical observations, patient history, and epidemiological information. The expected result is Negative.  Fact Sheet for Patients:  BloggerCourse.com  Fact Sheet for Healthcare Providers:  SeriousBroker.it  This test is no t yet approved or cleared by the [place] FDA and  has been authorized for detection and/or diagnosis of SARS-CoV-2 by FDA under an Emergency Use Authorization (EUA). This EUA will remain  in effect (meaning this test can be used) for the duration of the COVID-19 declaration under Section 564(b)(1) of the Act, 21 U.S.C.section 360bbb-3(b)(1), unless the authorization is terminated  or revoked sooner.       Influenza A by PCR NEGATIVE NEGATIVE   Influenza B by PCR NEGATIVE NEGATIVE    Comment: (NOTE) The Xpert Xpress SARS-CoV-2/FLU/RSV plus assay is intended as an aid in the diagnosis of influenza from Nasopharyngeal swab specimens and should not be used as a sole basis for treatment. Nasal washings and aspirates are unacceptable for Xpert Xpress SARS-CoV-2/FLU/RSV testing.  Fact Sheet for  Patients: BloggerCourse.com  Fact Sheet for Healthcare Providers: SeriousBroker.it  This test is not yet approved or cleared by the [place] FDA and has been authorized for detection and/or diagnosis of SARS-CoV-2 by FDA under an Emergency Use Authorization (EUA). This EUA will remain in effect (meaning this test can be used) for the duration of the COVID-19 declaration under Section 564(b)(1) of the Act, 21 U.S.C. section 360bbb-3(b)(1), unless the authorization is terminated or revoked.     Resp Syncytial Virus by PCR NEGATIVE NEGATIVE    Comment: (NOTE) Fact Sheet for Patients: BloggerCourse.com  Fact Sheet for Healthcare Providers: SeriousBroker.it  This test is not yet approved or cleared by the [place] FDA and has been authorized for detection and/or diagnosis of SARS-CoV-2 by FDA under an Emergency Use Authorization (EUA). This EUA will remain in effect (meaning this test can be used) for the duration of the COVID-19 declaration under Section 564(b)(1) of the Act, 21 U.S.C. section 360bbb-3(b)(1), unless the authorization is terminated or revoked.  Performed at [facility], [address] [place], [place] [address]    DG Forearm Left  Result Date: 05/09/2023 CLINICAL DATA:  Shoulder injury, fall EXAM: LEFT FOREARM - 2 VIEW COMPARISON:  None Available. FINDINGS: No fracture or dislocation is seen. The joint spaces are preserved. Visualized soft tissues are within normal limits. IMPRESSION: Negative. Electronically Signed   By: Charline Bills M.D.   On: 05/09/2023 22:21   DG Chest 2 View  Result Date: 05/09/2023 CLINICAL DATA:  Cough, weakness EXAM: CHEST - 2 VIEW COMPARISON:  04/13/2022 FINDINGS: Frontal and lateral views of the chest demonstrate an unremarkable cardiac silhouette. Subsegmental atelectasis or scarring at the left lung base. There  is increased bilateral bronchovascular prominence without focal consolidation, effusion, or pneumothorax. No acute bony abnormality. IMPRESSION: 1. Increased bilateral bronchovascular prominence which could reflect reactive airway disease or bronchitis. 2. No acute airspace disease. Electronically Signed   By: Sharlet Salina M.D.   On: 05/09/2023 22:21   DG Hand Complete Left  Result Date: 05/09/2023 CLINICAL DATA:  Multiple falls, left hand pain, thumb pain EXAM: LEFT HAND - COMPLETE 3+ VIEW COMPARISON:  None Available. FINDINGS: Frontal,  oblique, and lateral views of the left hand are obtained on 4 images. No acute fracture, subluxation, or dislocation. There is mild osteoarthritis at the first carpometacarpal joint. Remaining joint spaces are relatively well preserved. Soft tissues are unremarkable. IMPRESSION: 1. Mild osteoarthritis at the base of the thumb. 2. No acute displaced fracture. Electronically Signed   By: Sharlet Salina M.D.   On: 05/09/2023 18:34   DG Shoulder Left  Result Date: 05/09/2023 CLINICAL DATA:  Left shoulder pain, multiple falls, bruising EXAM: LEFT SHOULDER - 2+ VIEW COMPARISON:  None Available. FINDINGS: Internal rotation, external rotation, and transscapular views of the left shoulder are obtained. No acute fracture, subluxation, or dislocation. Mild hypertrophic changes of the acromioclavicular joint. Glenohumeral joint is well preserved. Soft tissues are unremarkable. Left chest is clear. IMPRESSION: 1. Hypertrophic changes left acromioclavicular joint. No acute fracture. Electronically Signed   By: Sharlet Salina M.D.   On: 05/09/2023 18:31    Pending Labs Unresulted Labs (From admission, onward)     Start     Ordered   05/09/23 2125  Urinalysis, Routine w reflex microscopic -Urine, Clean Catch  Once,   URGENT       Question:  Specimen Source  Answer:  Urine, Clean Catch   05/09/23 2125            Vitals/Pain Today's Vitals   05/09/23 2253 05/09/23 2300  05/09/23 2330 05/10/23 0000  BP:  (!) 118/91 109/60 95/67  Pulse:  72 68 66  Resp:  14 13 13   Temp:      TempSrc:      SpO2:  96% 93% 93%  Weight:      Height:      PainSc: 8        Isolation Precautions No active isolations  Medications Medications  acetaminophen (TYLENOL) tablet 1,000 mg (1,000 mg Oral Given 05/09/23 2130)  oxyCODONE (Oxy IR/ROXICODONE) immediate release tablet 5 mg (5 mg Oral Given 05/09/23 2130)    Mobility walks     Focused Assessments Neuro Assessment Handoff:  Swallow screen pass?            Neuro Assessment:   Neuro Checks:      Has TPA been given? No If patient is a Neuro Trauma and patient is going to OR before floor call report to 4N Charge nurse: [phone] or [phone]   R Recommendations: See Admitting Provider Note  Report given to:   Additional Notes:

## 2023-05-10 NOTE — ED Notes (Signed)
PT declined discharge vitals

## 2023-05-10 NOTE — ED Notes (Signed)
At this time, patient is refusing to be transported to [facility] for an MRI. Myself, [facility] RN and MD have all spoke with patient about the importance of being transferred for an MRI, but patient continues to refuse.

## 2023-05-12 DIAGNOSIS — G894 Chronic pain syndrome: Secondary | ICD-10-CM | POA: Diagnosis not present

## 2023-05-12 DIAGNOSIS — M5412 Radiculopathy, cervical region: Secondary | ICD-10-CM | POA: Diagnosis not present

## 2023-05-12 DIAGNOSIS — R296 Repeated falls: Secondary | ICD-10-CM | POA: Diagnosis not present

## 2023-05-12 DIAGNOSIS — G90522 Complex regional pain syndrome I of left lower limb: Secondary | ICD-10-CM | POA: Diagnosis not present

## 2023-05-12 DIAGNOSIS — Z79899 Other long term (current) drug therapy: Secondary | ICD-10-CM | POA: Diagnosis not present

## 2023-05-12 DIAGNOSIS — M961 Postlaminectomy syndrome, not elsewhere classified: Secondary | ICD-10-CM | POA: Diagnosis not present

## 2023-05-16 ENCOUNTER — Other Ambulatory Visit: Payer: Self-pay | Admitting: Physician Assistant

## 2023-05-17 ENCOUNTER — Emergency Department (HOSPITAL_COMMUNITY): Payer: Medicare Other

## 2023-05-17 ENCOUNTER — Other Ambulatory Visit: Payer: Self-pay

## 2023-05-17 ENCOUNTER — Emergency Department (HOSPITAL_COMMUNITY)
Admission: EM | Admit: 2023-05-17 | Discharge: 2023-05-17 | Disposition: A | Discharge status: Home / Self Care | Payer: Medicare Other | Attending: Emergency Medicine | Admitting: Emergency Medicine

## 2023-05-17 ENCOUNTER — Encounter (HOSPITAL_COMMUNITY): Payer: Self-pay | Admitting: Emergency Medicine

## 2023-05-17 DIAGNOSIS — Z79899 Other long term (current) drug therapy: Secondary | ICD-10-CM | POA: Insufficient documentation

## 2023-05-17 DIAGNOSIS — Z7901 Long term (current) use of anticoagulants: Secondary | ICD-10-CM | POA: Diagnosis not present

## 2023-05-17 DIAGNOSIS — W01198A Fall on same level from slipping, tripping and stumbling with subsequent striking against other object, initial encounter: Secondary | ICD-10-CM | POA: Insufficient documentation

## 2023-05-17 DIAGNOSIS — S0990XA Unspecified injury of head, initial encounter: Secondary | ICD-10-CM | POA: Diagnosis not present

## 2023-05-17 DIAGNOSIS — S0081XA Abrasion of other part of head, initial encounter: Secondary | ICD-10-CM | POA: Diagnosis not present

## 2023-05-17 DIAGNOSIS — S199XXA Unspecified injury of neck, initial encounter: Secondary | ICD-10-CM | POA: Diagnosis not present

## 2023-05-17 DIAGNOSIS — Z043 Encounter for examination and observation following other accident: Secondary | ICD-10-CM | POA: Diagnosis not present

## 2023-05-17 DIAGNOSIS — M47816 Spondylosis without myelopathy or radiculopathy, lumbar region: Secondary | ICD-10-CM | POA: Diagnosis not present

## 2023-05-17 DIAGNOSIS — R220 Localized swelling, mass and lump, head: Secondary | ICD-10-CM | POA: Diagnosis not present

## 2023-05-17 DIAGNOSIS — S0993XA Unspecified injury of face, initial encounter: Secondary | ICD-10-CM | POA: Diagnosis not present

## 2023-05-17 LAB — COMPREHENSIVE METABOLIC PANEL
ALT: 32 U/L (ref 0–44)
AST: 36 U/L (ref 15–41)
Albumin: 4.1 g/dL (ref 3.5–5.0)
Alkaline Phosphatase: 90 U/L (ref 38–126)
Anion gap: 8 (ref 5–15)
BUN: 11 mg/dL (ref 8–23)
CO2: 25 mmol/L (ref 22–32)
Calcium: 9.1 mg/dL (ref 8.9–10.3)
Chloride: 107 mmol/L (ref 98–111)
Creatinine, Ser: 1 mg/dL (ref 0.44–1.00)
GFR, Estimated: >60 mL/min (ref >60)
Glucose, Bld: 100 mg/dL — ABNORMAL HIGH (ref 70–99)
Potassium: 4 mmol/L (ref 3.5–5.1)
Sodium: 140 mmol/L (ref 135–145)
Total Bilirubin: 0.8 mg/dL (ref 0.3–1.2)
Total Protein: 7.6 g/dL (ref 6.5–8.1)

## 2023-05-17 LAB — TYPE AND SCREEN
ABO/RH(D): B POS
Antibody Screen: NEGATIVE

## 2023-05-17 LAB — CBC WITH DIFFERENTIAL/PLATELET
Abs Immature Granulocytes: 0.01 10*3/uL (ref 0.00–0.07)
Basophils Absolute: 0 10*3/uL (ref 0.0–0.1)
Basophils Relative: 1 %
Eosinophils Absolute: 0.2 10*3/uL (ref 0.0–0.5)
Eosinophils Relative: 4 %
HCT: 36.7 % (ref 36.0–46.0)
Hemoglobin: 12.3 g/dL (ref 12.0–15.0)
Immature Granulocytes: 0 %
Lymphocytes Relative: 40 %
Lymphs Abs: 1.6 10*3/uL (ref 0.7–4.0)
MCH: 29.2 pg (ref 26.0–34.0)
MCHC: 33.5 g/dL (ref 30.0–36.0)
MCV: 87.2 fL (ref 80.0–100.0)
Monocytes Absolute: 0.3 10*3/uL (ref 0.1–1.0)
Monocytes Relative: 7 %
Neutro Abs: 1.9 10*3/uL (ref 1.7–7.7)
Neutrophils Relative %: 48 %
Platelets: 218 10*3/uL (ref 150–400)
RBC: 4.21 MIL/uL (ref 3.87–5.11)
RDW: 13.7 % (ref 11.5–15.5)
WBC: 4 10*3/uL (ref 4.0–10.5)
nRBC: 0 % (ref 0.0–0.2)

## 2023-05-17 LAB — ETHANOL: Alcohol, Ethyl (B): <10 mg/dL (ref <10)

## 2023-05-17 LAB — I-STAT CHEM 8, ED
BUN: 13 mg/dL (ref 8–23)
Calcium, Ion: 1.11 mmol/L — ABNORMAL LOW (ref 1.15–1.40)
Chloride: 108 mmol/L (ref 98–111)
Creatinine, Ser: 1 mg/dL (ref 0.44–1.00)
Glucose, Bld: 99 mg/dL (ref 70–99)
HCT: 35 % — ABNORMAL LOW (ref 36.0–46.0)
Hemoglobin: 11.9 g/dL — ABNORMAL LOW (ref 12.0–15.0)
Potassium: 4 mmol/L (ref 3.5–5.1)
Sodium: 142 mmol/L (ref 135–145)
TCO2: 26 mmol/L (ref 22–32)

## 2023-05-17 LAB — PROTIME-INR
INR: 1.2 (ref 0.8–1.2)
Prothrombin Time: 15.2 seconds (ref 11.4–15.2)

## 2023-05-17 MED ORDER — ACETAMINOPHEN 500 MG PO TABS
1000.0000 mg | ORAL_TABLET | Freq: Once | ORAL | Status: AC
  Administered 2023-05-17: 1000 mg via ORAL
  Filled 2023-05-17: qty 2

## 2023-05-17 MED ORDER — GADOBUTROL 1 MMOL/ML IV SOLN
7.0000 mL | Freq: Once | INTRAVENOUS | Status: AC | PRN
  Administered 2023-05-17: 7 mL via INTRAVENOUS

## 2023-05-17 NOTE — ED Triage Notes (Signed)
Pt had fall with resulting hematoma to forehead.  Takes Plavix.  Has generalized weakness, grip strength equal. No drift.  Frequent falls recently.  Level 2 trauma d/t being symptomatic after fall on thinners.

## 2023-05-17 NOTE — Discharge Instructions (Signed)
Return for any problem.  ?

## 2023-05-17 NOTE — Progress Notes (Signed)
   05/17/23 1748  Spiritual Encounters  Type of Visit Initial  Care provided to: Family  Conversation partners present during encounter Nurse  Referral source Trauma page  Reason for visit Trauma  OnCall Visit Yes   Chaplain responded to a trauma in the ED - level II, fall on blood thinners. Chaplain met with patient's husband. Chaplain extended hospitality. Chaplain introduced spiritual care services. Spiritual care services available as needed.   Alda Ponder, Chaplain 05/17/23

## 2023-05-17 NOTE — Progress Notes (Signed)
Orthopedic Tech Progress Note Patient Details:  Becky Wolfe [facility] 30-Jul-1961 [id number]  Level 2 Trauma. Patient ID: Becky Wolfe, female   DOB: February 26, 1961, 62 y.o.   MRN: [id number]  Becky Wolfe 05/17/2023, 5:51 PM

## 2023-05-17 NOTE — ED Provider Notes (Signed)
EMERGENCY DEPARTMENT AT [facility] Provider Note   CSN: [id number] Arrival date & time: 05/17/23  1711     History {Add pertinent medical, surgical, social history, OB history to HPI:1} Chief Complaint  Patient presents with   Becky Wolfe is a 62 y.o. female.   Fall       Home Medications Prior to Admission medications   Medication Sig Start Date End Date Taking? Authorizing Provider  buPROPion (WELLBUTRIN XL) 150 MG 24 hr tablet Take 1 tablet (150 mg total) by mouth daily. 03/19/23   Melony Overly T, PA-C  clonazePAM (KLONOPIN) 0.5 MG tablet TAKE 1 TABLET(0.5 MG) BY MOUTH EVERY 6 HOURS AS NEEDED FOR ANXIETY 05/16/23   Melony Overly T, PA-C  cloNIDine (CATAPRES) 0.1 MG tablet Take 0.1 mg by mouth 2 (two) times daily.    [provider]  clopidogrel (PLAVIX) 75 MG tablet TAKE 1 TABLET(75 MG) BY MOUTH DAILY 08/29/22   Sater, Pearletha Furl, MD  gabapentin (NEURONTIN) 800 MG tablet Take 800 mg by mouth 3 (three) times daily. 05/12/17   [provider]  hydrOXYzine (ATARAX) 25 MG tablet One po bid prn agitation or insomnia 12/17/22   Sater, Pearletha Furl, MD  methadone (DOLOPHINE) 5 MG tablet Take 1 tablet (5 mg total) by mouth every 12 (twelve) hours. 04/15/22   Joseph Art, DO  naloxone [facility]) nasal spray 4 mg/0.1 mL Place 0.4 mg into the nose once as needed (opioid overdose). Patient not taking: Reported on 03/19/2023 04/02/22   [provider]  QUEtiapine (SEROQUEL XR) 400 MG 24 hr tablet Take 2 tablets (800 mg total) by mouth at bedtime. 04/18/23   Melony Overly T, PA-C  sertraline (ZOLOFT) 100 MG tablet TAKE 2 TABLETS(200 MG) BY MOUTH DAILY 04/30/23   Melony Overly T, PA-C      Allergies    Etodolac, Ambien [zolpidem tartrate], and Namenda [memantine]    Review of Systems   Review of Systems  Physical Exam Updated Vital Signs BP 118/68   Pulse 76   Temp 98.8 F (37.1 C) (Oral)   Resp 15   Ht 5\' 2"  (1.575 m)   Wt 70.8 kg    LMP 10/16/2011   SpO2 94%   BMI 28.53 kg/m  Physical Exam  ED Results / Procedures / Treatments   Labs (all labs ordered are listed, but only abnormal results are displayed) Labs Reviewed  COMPREHENSIVE METABOLIC PANEL - Abnormal; Notable for the following components:      Result Value   Glucose, Bld 100 (*)    All other components within normal limits  I-STAT CHEM 8, ED - Abnormal; Notable for the following components:   Calcium, Ion 1.11 (*)    Hemoglobin 11.9 (*)    HCT 35.0 (*)    All other components within normal limits  CBC WITH DIFFERENTIAL/PLATELET  PROTIME-INR  ETHANOL  URINALYSIS, W/ REFLEX TO CULTURE (INFECTION SUSPECTED)  TYPE AND SCREEN    EKG None  Radiology MR BRAIN W WO CONTRAST  Result Date: 05/17/2023 CLINICAL DATA:  Altered mental status EXAM: MRI HEAD WITHOUT AND WITH CONTRAST TECHNIQUE: Multiplanar, multiecho pulse sequences of the brain and surrounding structures were obtained without and with intravenous contrast. CONTRAST:  7mL GADAVIST GADOBUTROL 1 MMOL/ML IV SOLN COMPARISON:  06/13/2022 FINDINGS: Brain: No acute infarct, mass effect or extra-axial collection. No acute or chronic hemorrhage. There is confluent hyperintense T2-weighted signal within the white matter. Parenchymal volume and CSF  spaces are normal. The midline structures are normal. Vascular: Major flow voids are preserved. Skull and upper cervical spine: Normal calvarium and skull base. Visualized upper cervical spine and soft tissues are normal. Sinuses/Orbits:No paranasal sinus fluid levels or advanced mucosal thickening. Left mastoid effusion. Normal orbits. IMPRESSION: 1. No acute intracranial abnormality. 2. Confluent hyperintense T2-weighted signal within the white matter, most consistent with chronic small vessel ischemia. 3. Left mastoid effusion. Electronically Signed   By: Deatra Robinson M.D.   On: 05/17/2023 22:50   CT Head Wo Contrast  Result Date: 05/17/2023 CLINICAL DATA:  Head  trauma, focal neuro findings (Age 22-64y); Facial trauma, blunt; Neck trauma, dangerous injury mechanism (Age 70-64y) EXAM: CT HEAD WITHOUT CONTRAST CT MAXILLOFACIAL WITHOUT CONTRAST CT CERVICAL SPINE WITHOUT CONTRAST TECHNIQUE: Multidetector CT imaging of the head, cervical spine, and maxillofacial structures were performed using the standard protocol without intravenous contrast. Multiplanar CT image reconstructions of the cervical spine and maxillofacial structures were also generated. RADIATION DOSE REDUCTION: This exam was performed according to the departmental dose-optimization program which includes automated exposure control, adjustment of the mA and/or kV according to patient size and/or use of iterative reconstruction technique. COMPARISON:  03/15/2021, 10/17/2020 FINDINGS: CT HEAD FINDINGS Brain: No evidence of acute infarction, hemorrhage, hydrocephalus, extra-axial collection or mass lesion/mass effect. Extensive low-density changes within the periventricular and subcortical white matter most compatible with chronic microvascular ischemic change. Vascular: No hyperdense vessel or unexpected calcification. Skull: Normal. Negative for fracture or focal lesion. Other: Anterior frontal scalp soft tissue swelling. CT MAXILLOFACIAL FINDINGS Osseous: No acute maxillofacial bone fracture. Bony orbital walls are intact. Mandible intact. Temporomandibular joints are aligned without dislocation. Orbits: Negative. No traumatic or inflammatory finding. Sinuses: Clear. Soft tissues: Negative. CT CERVICAL SPINE FINDINGS Alignment: Facet joints are aligned without dislocation or traumatic listhesis. Dens and lateral masses are aligned. Skull base and vertebrae: Prior C4-T1 spinal fusion. Unchanged appearance of bilateral fractured screws within the T1 vertebrae. The C7-T1 ACDF construct remains unchanged in positioning. Soft tissues and spinal canal: No prevertebral fluid or swelling. No visible canal hematoma. Disc  levels:  Mild degenerative changes are similar to prior. Upper chest: Negative. Other: None. IMPRESSION: 1. No acute intracranial abnormality. 2. Anterior frontal scalp soft tissue swelling. No underlying calvarial fracture. 3. No acute maxillofacial bone fracture. 4. No acute fracture or subluxation of the cervical spine. 5. Prior C4-T1 spinal fusion. Unchanged appearance of bilateral fractured screws within the T1 vertebrae. Electronically Signed   By: Duanne Guess D.O.   On: 05/17/2023 17:59   CT Cervical Spine Wo Contrast  Result Date: 05/17/2023 CLINICAL DATA:  Head trauma, focal neuro findings (Age 81-64y); Facial trauma, blunt; Neck trauma, dangerous injury mechanism (Age 68-64y) EXAM: CT HEAD WITHOUT CONTRAST CT MAXILLOFACIAL WITHOUT CONTRAST CT CERVICAL SPINE WITHOUT CONTRAST TECHNIQUE: Multidetector CT imaging of the head, cervical spine, and maxillofacial structures were performed using the standard protocol without intravenous contrast. Multiplanar CT image reconstructions of the cervical spine and maxillofacial structures were also generated. RADIATION DOSE REDUCTION: This exam was performed according to the departmental dose-optimization program which includes automated exposure control, adjustment of the mA and/or kV according to patient size and/or use of iterative reconstruction technique. COMPARISON:  03/15/2021, 10/17/2020 FINDINGS: CT HEAD FINDINGS Brain: No evidence of acute infarction, hemorrhage, hydrocephalus, extra-axial collection or mass lesion/mass effect. Extensive low-density changes within the periventricular and subcortical white matter most compatible with chronic microvascular ischemic change. Vascular: No hyperdense vessel or unexpected calcification. Skull: Normal. Negative for fracture or  focal lesion. Other: Anterior frontal scalp soft tissue swelling. CT MAXILLOFACIAL FINDINGS Osseous: No acute maxillofacial bone fracture. Bony orbital walls are intact. Mandible intact.  Temporomandibular joints are aligned without dislocation. Orbits: Negative. No traumatic or inflammatory finding. Sinuses: Clear. Soft tissues: Negative. CT CERVICAL SPINE FINDINGS Alignment: Facet joints are aligned without dislocation or traumatic listhesis. Dens and lateral masses are aligned. Skull base and vertebrae: Prior C4-T1 spinal fusion. Unchanged appearance of bilateral fractured screws within the T1 vertebrae. The C7-T1 ACDF construct remains unchanged in positioning. Soft tissues and spinal canal: No prevertebral fluid or swelling. No visible canal hematoma. Disc levels:  Mild degenerative changes are similar to prior. Upper chest: Negative. Other: None. IMPRESSION: 1. No acute intracranial abnormality. 2. Anterior frontal scalp soft tissue swelling. No underlying calvarial fracture. 3. No acute maxillofacial bone fracture. 4. No acute fracture or subluxation of the cervical spine. 5. Prior C4-T1 spinal fusion. Unchanged appearance of bilateral fractured screws within the T1 vertebrae. Electronically Signed   By: Duanne Guess D.O.   On: 05/17/2023 17:59   CT Maxillofacial Wo Contrast  Result Date: 05/17/2023 CLINICAL DATA:  Head trauma, focal neuro findings (Age 6-64y); Facial trauma, blunt; Neck trauma, dangerous injury mechanism (Age 35-64y) EXAM: CT HEAD WITHOUT CONTRAST CT MAXILLOFACIAL WITHOUT CONTRAST CT CERVICAL SPINE WITHOUT CONTRAST TECHNIQUE: Multidetector CT imaging of the head, cervical spine, and maxillofacial structures were performed using the standard protocol without intravenous contrast. Multiplanar CT image reconstructions of the cervical spine and maxillofacial structures were also generated. RADIATION DOSE REDUCTION: This exam was performed according to the departmental dose-optimization program which includes automated exposure control, adjustment of the mA and/or kV according to patient size and/or use of iterative reconstruction technique. COMPARISON:  03/15/2021,  10/17/2020 FINDINGS: CT HEAD FINDINGS Brain: No evidence of acute infarction, hemorrhage, hydrocephalus, extra-axial collection or mass lesion/mass effect. Extensive low-density changes within the periventricular and subcortical white matter most compatible with chronic microvascular ischemic change. Vascular: No hyperdense vessel or unexpected calcification. Skull: Normal. Negative for fracture or focal lesion. Other: Anterior frontal scalp soft tissue swelling. CT MAXILLOFACIAL FINDINGS Osseous: No acute maxillofacial bone fracture. Bony orbital walls are intact. Mandible intact. Temporomandibular joints are aligned without dislocation. Orbits: Negative. No traumatic or inflammatory finding. Sinuses: Clear. Soft tissues: Negative. CT CERVICAL SPINE FINDINGS Alignment: Facet joints are aligned without dislocation or traumatic listhesis. Dens and lateral masses are aligned. Skull base and vertebrae: Prior C4-T1 spinal fusion. Unchanged appearance of bilateral fractured screws within the T1 vertebrae. The C7-T1 ACDF construct remains unchanged in positioning. Soft tissues and spinal canal: No prevertebral fluid or swelling. No visible canal hematoma. Disc levels:  Mild degenerative changes are similar to prior. Upper chest: Negative. Other: None. IMPRESSION: 1. No acute intracranial abnormality. 2. Anterior frontal scalp soft tissue swelling. No underlying calvarial fracture. 3. No acute maxillofacial bone fracture. 4. No acute fracture or subluxation of the cervical spine. 5. Prior C4-T1 spinal fusion. Unchanged appearance of bilateral fractured screws within the T1 vertebrae. Electronically Signed   By: Duanne Guess D.O.   On: 05/17/2023 17:59   DG Pelvis Portable  Result Date: 05/17/2023 CLINICAL DATA:  Status post fall. EXAM: PORTABLE PELVIS 1-2 VIEWS COMPARISON:  None. FINDINGS: No evidence for an acute fracture. Degenerative changes are seen in the lower lumbar spine. SI joints and symphysis pubis  unremarkable. Joint space in the hips is preserved. IMPRESSION: Negative. Electronically Signed   By: Kennith Center M.D.   On: 05/17/2023 17:54   DG Chest  Port 1 View  Result Date: 05/17/2023 CLINICAL DATA:  Fall on blood thinners EXAM: PORTABLE CHEST 1 VIEW COMPARISON:  None Available. FINDINGS: Normal mediastinum and cardiac silhouette. Normal pulmonary vasculature. No evidence of effusion, infiltrate, or pneumothorax. No acute bony abnormality. Chest cervical fusion IMPRESSION: No acute cardiopulmonary process. Electronically Signed   By: Genevive Bi M.D.   On: 05/17/2023 17:51    Procedures Procedures  {Document cardiac monitor, telemetry assessment procedure when appropriate:1}  Medications Ordered in ED Medications  gadobutrol (GADAVIST) 1 MMOL/ML injection 7 mL (7 mLs Intravenous Contrast Given 05/17/23 2214)  acetaminophen (TYLENOL) tablet 1,000 mg (1,000 mg Oral Given 05/17/23 2253)    ED Course/ Medical Decision Making/ A&P   {   Click here for ABCD2, HEART and other calculatorsREFRESH Note before signing :1}                          Medical Decision Making Amount and/or Complexity of Data Reviewed Labs: ordered. Radiology: ordered.  Risk OTC drugs. Prescription drug management.   ***  {Document critical care time when appropriate:1} {Document review of labs and clinical decision tools ie heart score, Chads2Vasc2 etc:1}  {Document your independent review of radiology images, and any outside records:1} {Document your discussion with family members, caretakers, and with consultants:1} {Document social determinants of health affecting pt's care:1} {Document your decision making why or why not admission, treatments were needed:1} Final Clinical Impression(s) / ED Diagnoses Final diagnoses:  Injury of head, initial encounter    Rx / DC Orders ED Discharge Orders     None

## 2023-05-17 NOTE — ED Notes (Signed)
Trauma Response Nurse Documentation   Becky Wolfe is a 62 y.o. female arriving to [facility] ED via EMS  On Eliquis (apixaban) daily. Trauma was activated as a Level 2 by charge RN  based on the following trauma criteria Elderly patients > 65 with head trauma on anti-coagulation (excluding ASA).  Patient cleared for CT by Dr. Rodena Medin. Pt transported to CT with trauma response nurse present to monitor. RN remained with the patient throughout their absence from the department for clinical observation.   GCS 15.  History   Past Medical History:  Diagnosis Date   Anxiety 06-16-13   hx. panic attacks, none recent   Blood transfusion without reported diagnosis    20 yrs ago after childbirth   Bronchitis    hx of   Chronic neck pain    Constipation    Depression    DVT (deep venous thrombosis) (HCC) 2015   rt. leg    Headache(784.0)    migraines-has decreased   Heart murmur    Dr. Dione Housekeeper   Hepatitis C    dx. Hep. C(s/p transfusion age 33) low Hgb  .-multiple transfusions. Treated successfully   Multiple sclerosis (HCC)    dx. -2yrs ago, then med stopped-due to liver enzymes changes,occ. periods of weakness in arms"d.  was seen at [facility] and tolded that she does not have MSrops things"   Pneumonia    hx of , ? mild emphysema on CT scan 5'14, multiple pulmonary nodules   RSD (reflex sympathetic dystrophy) 06-16-13   legs   RSD (reflex sympathetic dystrophy) 2009   TIA (transient ischemic attack)    hx of (weakness left side)-none in 1 yr     Past Surgical History:  Procedure Laterality Date   ABDOMINAL HYSTERECTOMY     ANAL RECTAL MANOMETRY N/A 04/11/2015   Procedure: ANO RECTAL MANOMETRY;  Surgeon: Willis Modena, MD;  Location: [facility];  Service: Endoscopy;  Laterality: N/A;   ANAL RECTAL MANOMETRY N/A 05/21/2017   Procedure: ANO RECTAL MANOMETRY;  Surgeon: Willis Modena, MD;  Location: [facility]OS[facility];  Service: Endoscopy;  Laterality: N/A;   ANKLE SURGERY Right    no  retained hardware   ANTERIOR CERVICAL DECOMP/DISCECTOMY FUSION  09/21/2012   Procedure: ANTERIOR CERVICAL DECOMPRESSION/DISCECTOMY FUSION 2 LEVELS;  Surgeon: Clydene Fake, MD;  Location: [facility]UR[facility];  Service: Neurosurgery;  Laterality: N/A;  Cervical five-six, cervical seven-thoracic one Anterior cervical decompression/diskectomy/fusion/Lifenet bone/trestle plate/Prior Cervical six--seven Anterior cervical fusion/Removing trestle plate   ANTERIOR CERVICAL DECOMP/DISCECTOMY FUSION N/A 08/05/2018   Procedure: Cervical four-five Anterior cervical decompression/discectomy/fusion;  Surgeon: Jadene Pierini, MD;  Location: [facility] OR;  Service: Neurosurgery;  Laterality: N/A;   APPENDECTOMY     CESAREAN SECTION     twice   COLONOSCOPY     ESOPHAGOGASTRODUODENOSCOPY (EGD) WITH PROPOFOL N/A 06/30/2013   Procedure: ESOPHAGOGASTRODUODENOSCOPY (EGD) WITH PROPOFOL;  Surgeon: Willis Modena, MD;  Location: [facility];  Service: Endoscopy;  Laterality: N/A;   NECK SURGERY     WEDGE RESECTION     left ovary with cyst removed ,hx. multiple cysts removed prior       Initial Focused Assessment (If applicable, or please see trauma documentation): Airway- clear Breathing - unlabored Circulation- no bleeding noted- hematoma to center forehead with abrasion.  GCS 15  CT's Completed:   CT Head and CT C-Spine   Interventions:   Labs Xrays CT scans  Plan for disposition:    Consults completed:    Event Summary:  Per  husband, pt walks stooped over and is supposed to use a walker but doesn't. She "gets going too fast and falls forward" Hx of stroke in the past- with residual weakness and slurred speech. Speech is more slurred today per husband. Pt is A/O x4  Bedside handoff with ED RN Carollee Herter, RN.    Becky Wolfe  Trauma Response RN  Please call TRN at [phone] for further assistance.

## 2023-05-26 ENCOUNTER — Other Ambulatory Visit: Payer: Medicare Other

## 2023-05-29 ENCOUNTER — Other Ambulatory Visit: Payer: Medicare Other

## 2023-05-29 DIAGNOSIS — K7460 Unspecified cirrhosis of liver: Secondary | ICD-10-CM | POA: Diagnosis not present

## 2023-05-29 DIAGNOSIS — K7469 Other cirrhosis of liver: Secondary | ICD-10-CM

## 2023-06-03 ENCOUNTER — Ambulatory Visit (INDEPENDENT_AMBULATORY_CARE_PROVIDER_SITE_OTHER): Payer: Self-pay | Admitting: Physician Assistant

## 2023-06-03 ENCOUNTER — Encounter: Payer: Self-pay | Admitting: Neurology

## 2023-06-03 ENCOUNTER — Ambulatory Visit: Payer: Medicare Other | Admitting: Neurology

## 2023-06-03 VITALS — BP 144/65 | HR 79 | Ht 62.0 in | Wt 157.0 lb

## 2023-06-03 DIAGNOSIS — G629 Polyneuropathy, unspecified: Secondary | ICD-10-CM

## 2023-06-03 DIAGNOSIS — R200 Anesthesia of skin: Secondary | ICD-10-CM

## 2023-06-03 DIAGNOSIS — G9340 Encephalopathy, unspecified: Secondary | ICD-10-CM | POA: Diagnosis not present

## 2023-06-03 DIAGNOSIS — R269 Unspecified abnormalities of gait and mobility: Secondary | ICD-10-CM | POA: Diagnosis not present

## 2023-06-03 DIAGNOSIS — R443 Hallucinations, unspecified: Secondary | ICD-10-CM | POA: Diagnosis not present

## 2023-06-03 DIAGNOSIS — F22 Delusional disorders: Secondary | ICD-10-CM

## 2023-06-03 DIAGNOSIS — Z91199 Patient's noncompliance with other medical treatment and regimen due to unspecified reason: Secondary | ICD-10-CM

## 2023-06-03 MED ORDER — HYDROXYZINE HCL 10 MG PO TABS: 10.0000 mg | ORAL_TABLET | Freq: Three times a day (TID) | ORAL | 5 refills | Status: DC
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
## 2023-06-03 NOTE — Progress Notes (Signed)
No show

## 2023-06-04 ENCOUNTER — Telehealth: Payer: Self-pay | Admitting: Neurology

## 2023-06-04 LAB — ANGIOTENSIN CONVERTING ENZYME: Angio Convert Enzyme: 115 U/L — ABNORMAL HIGH (ref 14–82)

## 2023-06-04 LAB — MULTIPLE MYELOMA PANEL, SERUM

## 2023-06-04 LAB — VITAMIN B12: Vitamin B-12: 411 pg/mL (ref 232–1245)

## 2023-06-04 NOTE — Telephone Encounter (Signed)
Referral for neurology fax to [facility]. Phone: [phone], Fax: [phone]

## 2023-06-09 ENCOUNTER — Other Ambulatory Visit: Payer: Self-pay | Admitting: Neurology

## 2023-06-09 ENCOUNTER — Telehealth: Payer: Self-pay | Admitting: Neurology

## 2023-06-09 DIAGNOSIS — R748 Abnormal levels of other serum enzymes: Secondary | ICD-10-CM

## 2023-06-09 DIAGNOSIS — D869 Sarcoidosis, unspecified: Secondary | ICD-10-CM

## 2023-06-09 NOTE — Telephone Encounter (Signed)
[employer] medicare NPR sent to GI [phone] 

## 2023-06-11 ENCOUNTER — Ambulatory Visit (INDEPENDENT_AMBULATORY_CARE_PROVIDER_SITE_OTHER): Payer: Medicare Other | Admitting: Physician Assistant

## 2023-06-11 ENCOUNTER — Encounter: Payer: Self-pay | Admitting: Physician Assistant

## 2023-06-11 DIAGNOSIS — F22 Delusional disorders: Secondary | ICD-10-CM

## 2023-06-11 DIAGNOSIS — F411 Generalized anxiety disorder: Secondary | ICD-10-CM

## 2023-06-11 DIAGNOSIS — F3341 Major depressive disorder, recurrent, in partial remission: Secondary | ICD-10-CM

## 2023-06-11 DIAGNOSIS — F4310 Post-traumatic stress disorder, unspecified: Secondary | ICD-10-CM | POA: Diagnosis not present

## 2023-06-11 DIAGNOSIS — R443 Hallucinations, unspecified: Secondary | ICD-10-CM

## 2023-06-11 MED ORDER — QUETIAPINE FUMARATE ER 400 MG PO TB24: 800.0000 mg | ORAL_TABLET | Freq: Every day | ORAL | 1 refills | Status: DC

## 2023-06-11 NOTE — Progress Notes (Signed)
[facility]  Patient ID: Becky Wolfe,  MRN: [id number]  PCP: Lupita Raider, MD  Date of Evaluation: 06/11/2023 Time spent: 32 minutes   Chief Complaint:  Chief Complaint   Anxiety; Depression; Follow-up    HISTORY/CURRENT STATUS: For routine med check.    We increased Seroquel 6 weeks ago. States she's sleeping better. When asked about the people that come in her house, moving things around, she said they still come but not as often and things do not get moved like they used to.  She does not see things or hear any voices that other people do not.  Reports she is afraid they will start coming back like they used to.  States her husband has noticed a difference in her being calmer and not getting as aggravated about what 'those people do.'  Patient denies increased energy with decreased need for sleep, increased talkativeness, racing thoughts, impulsivity or risky behaviors, increased spending, increased libido, grandiosity, increased irritability or anger.  Patient is able to enjoy things.  Energy and motivation are good.   No extreme sadness, tearfulness, or feelings of hopelessness.  ADLs and personal hygiene are normal.   Denies any changes in concentration, making decisions, or remembering things.  Appetite has not changed.  Weight is stable.  Anxiety is controlled.  She does take the Klonopin daily, more so in the evenings to help her relax.  Denies suicidal or homicidal thoughts.  Review of Systems  Constitutional: Negative.   HENT: Negative.    Eyes: Negative.   Respiratory: Negative.    Cardiovascular: Negative.   Gastrointestinal: Negative.   Genitourinary: Negative.   Musculoskeletal: Negative.   Skin: Negative.   Neurological: Negative.   Endo/Heme/Allergies: Negative.   Psychiatric/Behavioral:         See HPI   Individual Medical History/ Review of Systems: Changes? :Yes    patient fell after tripping on her granddaughter's playpen in her bedroom.  Her husband witnessed the fall.  She struck her head against furniture.  No loss of consciousness.  She went to the ER because of concern of slurred speech.  CT and MRI of her head showed no acute abnormalities.  MRI showed severe white matter changes predominantly in the deep white matter most consistent with severe chronic microvascular ischemic change See notes on chart She saw Dr. Despina Arias, neurology, 06/03/2023, note reviewed.  No medication changes were made except to add hydroxyzine.  Past medications for mental health diagnoses include: Klonopin, Zoloft, Paxil, Celexa, Ativan, Ambien caused sleep talking, Lamictal she preferred not to take.  Allergies: Etodolac, Ambien [zolpidem tartrate], and Namenda [memantine]  Current Medications:  Current Outpatient Medications:    buPROPion (WELLBUTRIN XL) 150 MG 24 hr tablet, Take 1 tablet (150 mg total) by mouth daily., Disp: 90 tablet, Rfl: 1   clonazePAM (KLONOPIN) 0.5 MG tablet, TAKE 1 TABLET(0.5 MG) BY MOUTH EVERY 6 HOURS AS NEEDED FOR ANXIETY, Disp: 120 tablet, Rfl: 0   cloNIDine (CATAPRES) 0.1 MG tablet, Take 0.1 mg by mouth 2 (two) times daily., Disp: , Rfl:    clopidogrel (PLAVIX) 75 MG tablet, TAKE 1 TABLET(75 MG) BY MOUTH DAILY, Disp: 90 tablet, Rfl: 3   gabapentin (NEURONTIN) 800 MG tablet, Take 800 mg by mouth 3 (three) times daily., Disp: , Rfl: 1   hydrOXYzine (ATARAX) 10 MG tablet, Take 1 tablet (10 mg total) by mouth 3 (three) times daily., Disp: 90 tablet, Rfl: 5   methadone (DOLOPHINE) 5 MG tablet, Take 1 tablet (  5 mg total) by mouth every 12 (twelve) hours., Disp: 30 tablet, Rfl: 0   sertraline (ZOLOFT) 100 MG tablet, TAKE 2 TABLETS(200 MG) BY MOUTH DAILY, Disp: 180 tablet, Rfl: 0   naloxone (NARCAN) nasal spray 4 mg/0.1 mL, Place 0.4 mg into the nose once as needed (opioid overdose). (Patient not taking: Reported on 03/19/2023), Disp: , Rfl:    QUEtiapine (SEROQUEL XR) 400 MG 24 hr tablet, Take 2 tablets (800 mg total) by  mouth at bedtime., Disp: 180 tablet, Rfl: 1 Medication Side Effects: none  Family Medical/ Social History: Changes? None  MENTAL HEALTH EXAM:  Last menstrual period 10/16/2011.There is no height or weight on file to calculate BMI.  General Appearance: Casual and Well Groomed  Eye Contact:  Good  Speech:  Clear and Coherent and Normal Rate  Volume:  Normal  Mood:  Euthymic  Affect:  Congruent  Thought Process:  Goal Directed and Descriptions of Associations: Circumstantial  Orientation:  Full (Time, Place, and Person)  Thought Content: Paranoid Ideation   Suicidal Thoughts:  No  Homicidal Thoughts:  No  Memory:  WNL  Judgement:  Fair  Insight:  Fair  Psychomotor Activity:  Normal  Concentration:  Concentration: Fair and Attention Span: Fair  Recall:  Fair  Fund of Knowledge: Good  Language: Good  Assets:  Desire for Improvement Financial Resources/Insurance Housing Transportation  ADL's:  Intact  Cognition: WNL  Prognosis:  Fair   Labs 05/17/2023 CBC with differential is normal CMP electrolytes are normal glucose 100, BUN and creatinine are normal, LFTs normal  06/03/2023 Angiotensin-converting enzyme, multiple myeloma panel, vitamin B12 level normal  GAD-7    Flowsheet Row Office Visit from 07/03/2020 in [facility]  Total GAD-7 Score 19      Mini-Mental    Flowsheet Row Office Visit from 04/18/2023 in [facility]  Total Score (max 30 points ) 30      PHQ2-9    Flowsheet Row Office Visit from 07/03/2020 in [facility]  PHQ-2 Total Score 4  PHQ-9 Total Score 12      Flowsheet Row ED from 05/17/2023 in [facility] at [facility] ED from 05/09/2023 in [facility] Emergency Department at [facility] ED from 01/11/2023 in [facility] Emergency Department at [facility]  C-SSRS RISK CATEGORY No Risk No Risk No Risk      DIAGNOSES:     ICD-10-CM   1. Paranoia (HCC)  F22     2. Recurrent major depressive disorder, in partial remission (HCC)  F33.41     3. Generalized anxiety disorder  F41.1     4. PTSD (post-traumatic stress disorder)  F43.10     5. Hallucinations  R44.3       Receiving Psychotherapy: No    Chari Manning, [facility] in the past.   RECOMMENDATIONS:  PDMP was reviewed. Last Klonopin 05/16/2023.  Also gabapentin and methadone known to me. I provided 32 minutes of face to face time during this encounter, including time spent before and after the visit in records review, medical decision making, counseling pertinent to today's visit, and charting.   She is not having hallucinations or as paranoid since we increased the Seroquel.  Recommend leaving the dose the same.  Adding Aricept or Namenda are a thought, however I will not do so, will defer to neurology, Dr. Epimenio Foot, since she is being worked up under his care.  Continue Wellbutrin XL  150 mg, 1 p.o. every morning. Continue Klonopin 0.5 mg, 1 p.o. every 6 hours as needed anxiety. Continue clonidine 0.1 mg, 1 p.o. twice daily by another provider. Continue gabapentin 800 mg, 1 p.o. 3 times daily. Continue hydroxyzine 25 mg, 1 p.o. twice daily as needed. Continue Seroquel XR 400 mg, 2 po qhs.  Continue Zoloft 100 mg, 2 po qd.  Return in 3 months.  Melony Overly, PA-C

## 2023-06-12 ENCOUNTER — Telehealth: Payer: Self-pay | Admitting: Physician Assistant

## 2023-06-12 NOTE — Telephone Encounter (Signed)
Have her take Hydroxyzine 25 mg, 1 now and 1 before bed. If she's psychotic or suicidal, take her to [facility]. Thanks.

## 2023-06-12 NOTE — Telephone Encounter (Signed)
Becky Wolfe's husband, Becky Wolfe, called at 4:35. She has had a very bad day and is distressed. He went out to the store and came back and she was sitting on the street curb. He would like to know what to do to calm her down, or if there is a medicine to help calm her. [phone]

## 2023-06-12 NOTE — Telephone Encounter (Signed)
Husband notified of recommendations. Patient is psychotic, thinking people are coming in the house and want to kill her. Advised he take her to [facility]. He said he has a safe and is going to lock up her medication and administer them.

## 2023-06-13 DIAGNOSIS — M25532 Pain in left wrist: Secondary | ICD-10-CM | POA: Diagnosis not present

## 2023-06-13 DIAGNOSIS — M65312 Trigger thumb, left thumb: Secondary | ICD-10-CM | POA: Diagnosis not present

## 2023-06-18 ENCOUNTER — Other Ambulatory Visit: Payer: Medicare Other

## 2023-06-20 ENCOUNTER — Other Ambulatory Visit: Payer: Medicare Other

## 2023-06-24 ENCOUNTER — Ambulatory Visit
Admission: RE | Admit: 2023-06-24 | Discharge: 2023-06-24 | Disposition: A | Discharge status: Home / Self Care | Payer: Medicare Other | Source: Ambulatory Visit | Attending: Neurology | Admitting: Neurology

## 2023-06-24 ENCOUNTER — Ambulatory Visit: Payer: Medicare Other | Admitting: Neurology

## 2023-06-24 DIAGNOSIS — R748 Abnormal levels of other serum enzymes: Secondary | ICD-10-CM

## 2023-06-24 DIAGNOSIS — I700 Atherosclerosis of aorta: Secondary | ICD-10-CM | POA: Diagnosis not present

## 2023-06-24 DIAGNOSIS — D869 Sarcoidosis, unspecified: Secondary | ICD-10-CM

## 2023-06-24 DIAGNOSIS — R911 Solitary pulmonary nodule: Secondary | ICD-10-CM | POA: Diagnosis not present

## 2023-06-27 ENCOUNTER — Other Ambulatory Visit: Payer: Self-pay | Admitting: Physician Assistant

## 2023-06-30 DIAGNOSIS — M25511 Pain in right shoulder: Secondary | ICD-10-CM | POA: Diagnosis not present

## 2023-06-30 DIAGNOSIS — M25521 Pain in right elbow: Secondary | ICD-10-CM | POA: Diagnosis not present

## 2023-06-30 DIAGNOSIS — M65312 Trigger thumb, left thumb: Secondary | ICD-10-CM | POA: Diagnosis not present

## 2023-06-30 DIAGNOSIS — M25532 Pain in left wrist: Secondary | ICD-10-CM | POA: Diagnosis not present
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
## 2023-07-02 ENCOUNTER — Telehealth: Payer: Self-pay

## 2023-07-02 NOTE — Telephone Encounter (Signed)
Call to patient, she verbalized understanding of results. No other concerns or questions at this time.

## 2023-07-02 NOTE — Telephone Encounter (Signed)
-----   Message from Asa Lente sent at 07/01/2023  5:02 PM EDT ----- Please let her know that the CAT scan of the chest was unchanged compared to her previous CT scan.  The lymph nodes were normal in size so sarcoid is unlikely.

## 2023-07-18 DIAGNOSIS — G90522 Complex regional pain syndrome I of left lower limb: Secondary | ICD-10-CM | POA: Diagnosis not present

## 2023-07-18 DIAGNOSIS — G894 Chronic pain syndrome: Secondary | ICD-10-CM | POA: Diagnosis not present

## 2023-07-18 DIAGNOSIS — M5412 Radiculopathy, cervical region: Secondary | ICD-10-CM | POA: Diagnosis not present

## 2023-07-18 DIAGNOSIS — Z79899 Other long term (current) drug therapy: Secondary | ICD-10-CM | POA: Diagnosis not present

## 2023-07-18 DIAGNOSIS — M961 Postlaminectomy syndrome, not elsewhere classified: Secondary | ICD-10-CM | POA: Diagnosis not present

## 2023-07-21 NOTE — Anesthesia Preprocedure Evaluation (Addendum)
Anesthesia Evaluation  Patient identified by MRN, date of birth, ID band Patient awake    Reviewed: Allergy & Precautions, NPO status , Patient's Chart, lab work & pertinent test results  History of Anesthesia Complications Negative for: history of anesthetic complications  Airway Mallampati: III  TM Distance: >3 FB Neck ROM: Full    Dental  (+) Dental Advisory Given   Pulmonary neg shortness of breath, neg sleep apnea, neg COPD, neg recent URI, Current Smoker and Patient abstained from smoking.   Pulmonary exam normal breath sounds clear to auscultation       Cardiovascular hypertension (clonidine), (-) angina + CAD and + DVT  + Valvular Problems/Murmurs  Rhythm:Regular Rate:Normal     Neuro/Psych  Headaches, neg Seizures PSYCHIATRIC DISORDERS (opioid dependence, on methadone) Anxiety Depression    TIA Neuromuscular disease (Multiple sclerosism chronic neck pain s/p fusion, RSD, cervical myelopathy)    GI/Hepatic negative GI ROS,,,(+) Cirrhosis       , Hepatitis - (s/p treatment), C  Endo/Other  negative endocrine ROS    Renal/GU negative Renal ROS     Musculoskeletal   Abdominal   Peds  Hematology  (+) Blood dyscrasia, anemia   Anesthesia Other Findings Last Plavix: 07/16/2023  Patient appears very sleepy this morning. She keeps closing her eyes and leaning forward during the interview. When prompted, she will open her eyes and answer all questions appropriately. She is oriented. She reports that she did not sleep last night. Additionally, she took Seroquel 800 mg, clonazepam, gabapentin, hydroxyzine, methadone, and Wellbutrin this morning. Discussed with patient and her son that several of these medications can make you sleepy and that this combination can make her even sleepier. Also discussed that Seroquel is usually taken at night for this reason. Patient responded by saying she does fine with these medications and  they don't make her sleepy. Given that patient does appropriately respond to questions and is oriented, will proceed with procedure under local and light MAC.  Reproductive/Obstetrics                             Anesthesia Physical Anesthesia Plan  ASA: 3  Anesthesia Plan: MAC   Post-op Pain Management:    Induction: Intravenous  PONV Risk Score and Plan: 1 and Propofol infusion, Ondansetron, TIVA and Treatment may vary due to age or medical condition  Airway Management Planned: Natural Airway and Simple Face Mask  Additional Equipment:   Intra-op Plan:   Post-operative Plan:   Informed Consent: I have reviewed the patients History and Physical, chart, labs and discussed the procedure including the risks, benefits and alternatives for the proposed anesthesia with the patient or authorized representative who has indicated his/her understanding and acceptance.     Dental advisory given  Plan Discussed with: CRNA and Anesthesiologist  Anesthesia Plan Comments: (Discussed with patient risks of MAC including, but not limited to, minor pain or discomfort, hearing people in the room, and possible need for backup general anesthesia. Risks for general anesthesia also discussed including, but not limited to, sore throat, hoarse voice, chipped/damaged teeth, injury to vocal cords, nausea and vomiting, allergic reactions, lung infection, heart attack, stroke, and death. All questions answered. )       Anesthesia Quick Evaluation

## 2023-07-22 ENCOUNTER — Encounter (HOSPITAL_BASED_OUTPATIENT_CLINIC_OR_DEPARTMENT_OTHER)
Admission: RE | Disposition: A | Discharge status: Home / Self Care | Payer: Self-pay | Source: Home / Self Care | Attending: Orthopedic Surgery

## 2023-07-22 ENCOUNTER — Other Ambulatory Visit: Payer: Self-pay

## 2023-07-22 ENCOUNTER — Ambulatory Visit (HOSPITAL_BASED_OUTPATIENT_CLINIC_OR_DEPARTMENT_OTHER): Payer: Medicare Other | Admitting: Anesthesiology

## 2023-07-22 ENCOUNTER — Encounter (HOSPITAL_BASED_OUTPATIENT_CLINIC_OR_DEPARTMENT_OTHER): Payer: Self-pay | Admitting: Orthopedic Surgery

## 2023-07-22 ENCOUNTER — Ambulatory Visit (HOSPITAL_BASED_OUTPATIENT_CLINIC_OR_DEPARTMENT_OTHER)
Admission: RE | Admit: 2023-07-22 | Discharge: 2023-07-22 | Disposition: A | Discharge status: Home / Self Care | Payer: Medicare Other | Attending: Orthopedic Surgery | Admitting: Orthopedic Surgery

## 2023-07-22 DIAGNOSIS — I10 Essential (primary) hypertension: Secondary | ICD-10-CM

## 2023-07-22 DIAGNOSIS — I2510 Atherosclerotic heart disease of native coronary artery without angina pectoris: Secondary | ICD-10-CM | POA: Diagnosis not present

## 2023-07-22 DIAGNOSIS — F17210 Nicotine dependence, cigarettes, uncomplicated: Secondary | ICD-10-CM

## 2023-07-22 DIAGNOSIS — K7460 Unspecified cirrhosis of liver: Secondary | ICD-10-CM | POA: Diagnosis not present

## 2023-07-22 DIAGNOSIS — Z7902 Long term (current) use of antithrombotics/antiplatelets: Secondary | ICD-10-CM | POA: Diagnosis not present

## 2023-07-22 DIAGNOSIS — F32A Depression, unspecified: Secondary | ICD-10-CM | POA: Diagnosis not present

## 2023-07-22 DIAGNOSIS — Z86718 Personal history of other venous thrombosis and embolism: Secondary | ICD-10-CM | POA: Diagnosis not present

## 2023-07-22 DIAGNOSIS — Z79891 Long term (current) use of opiate analgesic: Secondary | ICD-10-CM | POA: Diagnosis not present

## 2023-07-22 DIAGNOSIS — F419 Anxiety disorder, unspecified: Secondary | ICD-10-CM | POA: Insufficient documentation

## 2023-07-22 DIAGNOSIS — G8929 Other chronic pain: Secondary | ICD-10-CM | POA: Insufficient documentation

## 2023-07-22 DIAGNOSIS — Z8673 Personal history of transient ischemic attack (TIA), and cerebral infarction without residual deficits: Secondary | ICD-10-CM | POA: Insufficient documentation

## 2023-07-22 DIAGNOSIS — M65312 Trigger thumb, left thumb: Secondary | ICD-10-CM

## 2023-07-22 DIAGNOSIS — Z79899 Other long term (current) drug therapy: Secondary | ICD-10-CM | POA: Diagnosis not present

## 2023-07-22 DIAGNOSIS — B1920 Unspecified viral hepatitis C without hepatic coma: Secondary | ICD-10-CM | POA: Insufficient documentation

## 2023-07-22 HISTORY — PX: TRIGGER FINGER RELEASE: SHX641

## 2023-07-22 SURGERY — RELEASE, A1 PULLEY, FOR TRIGGER FINGER
Anesthesia: Monitor Anesthesia Care | Site: Finger | Laterality: Left

## 2023-07-22 MED ORDER — ACETAMINOPHEN 500 MG PO TABS
ORAL_TABLET | ORAL | Status: AC
  Filled 2023-07-22: qty 2

## 2023-07-22 MED ORDER — AMISULPRIDE (ANTIEMETIC) 5 MG/2ML IV SOLN: 10.0000 mg | Freq: Once | INTRAVENOUS | Status: DC | PRN

## 2023-07-22 MED ORDER — TRAMADOL HCL 50 MG PO TABS
50.0000 mg | ORAL_TABLET | Freq: Two times a day (BID) | ORAL | 0 refills | Status: DC | PRN
Start: 2023-07-22 — End: 2024-02-13

## 2023-07-22 MED ORDER — ONDANSETRON 4 MG PO TBDP: 4.0000 mg | ORAL_TABLET | Freq: Three times a day (TID) | ORAL | 0 refills | Status: DC | PRN

## 2023-07-22 MED ORDER — PROPOFOL 10 MG/ML IV BOLUS
INTRAVENOUS | Status: DC | PRN
  Administered 2023-07-22: 30 mg via INTRAVENOUS

## 2023-07-22 MED ORDER — LACTATED RINGERS IV SOLN: INTRAVENOUS | Status: DC

## 2023-07-22 MED ORDER — LIDOCAINE HCL (PF) 1 % IJ SOLN
INTRAMUSCULAR | Status: DC | PRN
Start: 2023-07-22 — End: 2023-07-22
  Administered 2023-07-22: 3 mL

## 2023-07-22 MED ORDER — CEFAZOLIN SODIUM-DEXTROSE 2-4 GM/100ML-% IV SOLN
INTRAVENOUS | Status: AC
  Filled 2023-07-22: qty 100

## 2023-07-22 MED ORDER — BUPIVACAINE HCL (PF) 0.5 % IJ SOLN
INTRAMUSCULAR | Status: DC | PRN
  Administered 2023-07-22: 3 mL

## 2023-07-22 MED ORDER — PROPOFOL 500 MG/50ML IV EMUL
INTRAVENOUS | Status: DC | PRN
  Administered 2023-07-22: 75 ug/kg/min via INTRAVENOUS

## 2023-07-22 MED ORDER — FENTANYL CITRATE (PF) 100 MCG/2ML IJ SOLN
INTRAMUSCULAR | Status: AC
  Filled 2023-07-22: qty 2

## 2023-07-22 MED ORDER — OXYCODONE HCL 5 MG/5ML PO SOLN: 5.0000 mg | Freq: Once | ORAL | Status: DC | PRN

## 2023-07-22 MED ORDER — MIDAZOLAM HCL 2 MG/2ML IJ SOLN
INTRAMUSCULAR | Status: AC
  Filled 2023-07-22: qty 2

## 2023-07-22 MED ORDER — ONDANSETRON HCL 4 MG/2ML IJ SOLN
INTRAMUSCULAR | Status: DC | PRN
  Administered 2023-07-22: 4 mg via INTRAVENOUS

## 2023-07-22 MED ORDER — ONDANSETRON HCL 4 MG/2ML IJ SOLN
INTRAMUSCULAR | Status: AC
  Filled 2023-07-22: qty 2

## 2023-07-22 MED ORDER — LIDOCAINE HCL (PF) 1 % IJ SOLN
INTRAMUSCULAR | Status: AC
  Filled 2023-07-22: qty 30

## 2023-07-22 MED ORDER — PROPOFOL 10 MG/ML IV BOLUS
INTRAVENOUS | Status: AC
  Filled 2023-07-22: qty 20

## 2023-07-22 MED ORDER — POVIDONE-IODINE 10 % EX SWAB
2.0000 | Freq: Once | CUTANEOUS | Status: AC
  Administered 2023-07-22: 2 via TOPICAL

## 2023-07-22 MED ORDER — DEXAMETHASONE SODIUM PHOSPHATE 10 MG/ML IJ SOLN
8.0000 mg | Freq: Once | INTRAMUSCULAR | Status: AC
  Administered 2023-07-22: 8 mg via INTRAVENOUS

## 2023-07-22 MED ORDER — CEFAZOLIN SODIUM-DEXTROSE 2-4 GM/100ML-% IV SOLN
2.0000 g | INTRAVENOUS | Status: AC
  Administered 2023-07-22: 2 g via INTRAVENOUS

## 2023-07-22 MED ORDER — ACETAMINOPHEN 500 MG PO TABS: 1000.0000 mg | ORAL_TABLET | Freq: Four times a day (QID) | ORAL | 0 refills | Status: DC | PRN

## 2023-07-22 MED ORDER — LIDOCAINE 2% (20 MG/ML) 5 ML SYRINGE
INTRAMUSCULAR | Status: AC
  Filled 2023-07-22: qty 5

## 2023-07-22 MED ORDER — OXYCODONE HCL 5 MG PO TABS: 5.0000 mg | ORAL_TABLET | Freq: Once | ORAL | Status: DC | PRN

## 2023-07-22 MED ORDER — FENTANYL CITRATE (PF) 100 MCG/2ML IJ SOLN: 25.0000 ug | INTRAMUSCULAR | Status: DC | PRN

## 2023-07-22 MED ORDER — ACETAMINOPHEN 500 MG PO TABS
1000.0000 mg | ORAL_TABLET | Freq: Once | ORAL | Status: AC
  Administered 2023-07-22: 1000 mg via ORAL

## 2023-07-22 MED ORDER — DEXAMETHASONE SODIUM PHOSPHATE 10 MG/ML IJ SOLN
INTRAMUSCULAR | Status: AC
  Filled 2023-07-22: qty 1

## 2023-07-22 SURGICAL SUPPLY — 44 items
APL PRP STRL LF DISP 70% ISPRP (MISCELLANEOUS) ×1
BLADE SURG 11 STRL SS (BLADE) IMPLANT
BLADE SURG 15 STRL LF DISP TIS (BLADE) ×1 IMPLANT
BLADE SURG 15 STRL SS (BLADE) ×1
BNDG CMPR 5X3 CHSV STRCH STRL (GAUZE/BANDAGES/DRESSINGS)
BNDG CMPR 5X3 KNIT ELC UNQ LF (GAUZE/BANDAGES/DRESSINGS) ×1
BNDG CMPR 9X4 STRL LF SNTH (GAUZE/BANDAGES/DRESSINGS)
BNDG COHESIVE 3X5 TAN ST LF (GAUZE/BANDAGES/DRESSINGS) IMPLANT
BNDG ELASTIC 3INX 5YD STR LF (GAUZE/BANDAGES/DRESSINGS) ×1 IMPLANT
BNDG ESMARK 4X9 LF (GAUZE/BANDAGES/DRESSINGS) IMPLANT
BNDG GAUZE DERMACEA FLUFF 4 (GAUZE/BANDAGES/DRESSINGS) ×1 IMPLANT
BNDG GZE DERMACEA 4 6PLY (GAUZE/BANDAGES/DRESSINGS) ×1
CHLORAPREP W/TINT 26 (MISCELLANEOUS) ×1 IMPLANT
CORD BIPOLAR FORCEPS 12FT (ELECTRODE) IMPLANT
COVER BACK TABLE 60X90IN (DRAPES) ×1 IMPLANT
CUFF TOURN SGL QUICK 18X4 (TOURNIQUET CUFF) ×1 IMPLANT
DRAPE EXTREMITY T 121X128X90 (DISPOSABLE) ×1 IMPLANT
DRAPE IMP U-DRAPE 54X76 (DRAPES) ×2 IMPLANT
DRAPE SURG 17X23 STRL (DRAPES) ×2 IMPLANT
DRSG EMULSION OIL 3X3 NADH (GAUZE/BANDAGES/DRESSINGS) ×1 IMPLANT
GAUZE PAD ABD 8X10 STRL (GAUZE/BANDAGES/DRESSINGS) IMPLANT
GAUZE SPONGE 4X4 12PLY STRL (GAUZE/BANDAGES/DRESSINGS) ×1 IMPLANT
GLOVE BIO SURGEON STRL SZ7.5 (GLOVE) ×1 IMPLANT
GLOVE BIOGEL PI IND STRL 7.5 (GLOVE) ×2 IMPLANT
GLOVE BIOGEL PI IND STRL 8 (GLOVE) ×2 IMPLANT
GLOVE SURG SYN 7.5 E (GLOVE) ×1
GLOVE SURG SYN 7.5 PF PI (GLOVE) ×2 IMPLANT
GOWN STRL REUS W/ TWL LRG LVL3 (GOWN DISPOSABLE) ×2 IMPLANT
GOWN STRL REUS W/ TWL XL LVL3 (GOWN DISPOSABLE) ×1 IMPLANT
GOWN STRL REUS W/TWL LRG LVL3 (GOWN DISPOSABLE) ×2
GOWN STRL REUS W/TWL XL LVL3 (GOWN DISPOSABLE) ×1
NDL HYPO 25X1 1.5 SAFETY (NEEDLE) IMPLANT
NEEDLE HYPO 25X1 1.5 SAFETY (NEEDLE) ×1
NS IRRIG 1000ML POUR BTL (IV SOLUTION) ×2 IMPLANT
PACK BASIN DAY SURGERY FS (CUSTOM PROCEDURE TRAY) ×1 IMPLANT
PAD CAST 3X4 CTTN HI CHSV (CAST SUPPLIES) IMPLANT
PADDING CAST ABS COTTON 3X4 (CAST SUPPLIES) IMPLANT
PADDING CAST ABS COTTON 4X4 ST (CAST SUPPLIES) ×1 IMPLANT
PADDING CAST COTTON 3X4 STRL (CAST SUPPLIES)
SUT ETHILON 3 0 PS 1 (SUTURE) ×1 IMPLANT
SYR BULB EAR ULCER 3OZ GRN STR (SYRINGE) ×2 IMPLANT
SYR CONTROL 10ML LL (SYRINGE) IMPLANT
TOWEL GREEN STERILE FF (TOWEL DISPOSABLE) ×1 IMPLANT
UNDERPAD 30X36 HEAVY ABSORB (UNDERPADS AND DIAPERS) ×2 IMPLANT

## 2023-07-22 NOTE — Anesthesia Procedure Notes (Signed)
Procedure Name: MAC Date/Time: 07/22/2023 9:58 AM  Performed by: Demetrio Lapping, CRNAPre-anesthesia Checklist: Patient identified, Emergency Drugs available, Suction available, Patient being monitored and Timeout performed Patient Re-evaluated:Patient Re-evaluated prior to induction Oxygen Delivery Method: Simple face mask Placement Confirmation: positive ETCO2 Dental Injury: Teeth and Oropharynx as per pre-operative assessment

## 2023-07-22 NOTE — Transfer of Care (Signed)
Immediate Anesthesia Transfer of Care Note  Patient: Becky Wolfe  Procedure(s) Performed: RELEASE TRIGGER FINGER/A-1 PULLEY (Left: Finger)  Patient Location: PACU  Anesthesia Type:MAC  Level of Consciousness: sedated  Airway & Oxygen Therapy: Patient Spontanous Breathing and Patient connected to face mask oxygen  Post-op Assessment: Report given to RN and Post -op Vital signs reviewed and stable  Post vital signs: Reviewed and stable  Last Vitals:  Vitals Value Taken Time  BP    Temp    Pulse 66 07/22/23 1029  Resp 11 07/22/23 1029  SpO2 99 % 07/22/23 1029  Vitals shown include unfiled device data.  Last Pain:  Vitals:   07/22/23 0819  TempSrc: Oral  PainSc: (S) 0-No pain      Patients Stated Pain Goal: 5 (07/22/23 1610)  Complications: No notable events documented.

## 2023-07-22 NOTE — Discharge Instructions (Addendum)
  Post Anesthesia Home Care Instructions  Activity: Get plenty of rest for the remainder of the day. A responsible individual must stay with you for 24 hours following the procedure.  For the next 24 hours, DO NOT: -Drive a car -Advertising copywriter -Drink alcoholic beverages -Take any medication unless instructed by your physician -Make any legal decisions or sign important papers.  Meals: Start with liquid foods such as gelatin or soup. Progress to regular foods as tolerated. Avoid greasy, spicy, heavy foods. If nausea and/or vomiting occur, drink only clear liquids until the nausea and/or vomiting subsides. Call your physician if vomiting continues.  Special Instructions/Symptoms: Your throat may feel dry or sore from the anesthesia or the breathing tube placed in your throat during surgery. If this causes discomfort, gargle with warm salt water. The discomfort should disappear within 24 hours.  If you had a scopolamine patch placed behind your ear for the management of post- operative nausea and/or vomiting:  1. The medication in the patch is effective for 72 hours, after which it should be removed.  Wrap patch in a tissue and discard in the trash. Wash hands thoroughly with soap and water. 2. You may remove the patch earlier than 72 hours if you experience unpleasant side effects which may include dry mouth, dizziness or visual disturbances. 3. Avoid touching the patch. Wash your hands with soap and water after contact with the patch.        nEXT DOSE OF tYLENOL MAY BE TAKEN AT 3P

## 2023-07-22 NOTE — Anesthesia Postprocedure Evaluation (Signed)
Anesthesia Post Note  Patient: Becky Wolfe  Procedure(s) Performed: RELEASE TRIGGER FINGER/A-1 PULLEY (Left: Finger)     Patient location during evaluation: PACU Anesthesia Type: MAC Level of consciousness: awake Pain management: pain level controlled Vital Signs Assessment: post-procedure vital signs reviewed and stable Respiratory status: spontaneous breathing, nonlabored ventilation and respiratory function stable Cardiovascular status: stable and blood pressure returned to baseline Postop Assessment: no apparent nausea or vomiting Anesthetic complications: no   No notable events documented.  Last Vitals:  Vitals:   07/22/23 1100 07/22/23 1121  BP: (!) 151/75 (!) 157/72  Pulse: 66 87  Resp: 13 16  Temp:  (!) 36.2 C  SpO2: 93% 94%    Last Pain:  Vitals:   07/22/23 1121  TempSrc:   PainSc: 0-No pain                 Linton Rump

## 2023-07-22 NOTE — Interval H&P Note (Signed)
History and Physical Interval Note:  07/22/2023 9:09 AM  Becky Wolfe  has presented today for surgery, with the diagnosis of LEFT TRIGGER THUMB.  The various methods of treatment have been discussed with the patient and family. After consideration of risks, benefits and other options for treatment, the patient has consented to  Procedure(s): RELEASE TRIGGER FINGER/A-1 PULLEY (Left) as a surgical intervention.  The patient's history has been reviewed, patient examined, no change in status, stable for surgery.  I have reviewed the patient's chart and labs.  Questions were answered to the patient's satisfaction.     Sheral Apley

## 2023-07-22 NOTE — Op Note (Signed)
07/22/2023  12:34 PM  PATIENT:  Becky Wolfe    PRE-OPERATIVE DIAGNOSIS:  LEFT TRIGGER THUMB  POST-OPERATIVE DIAGNOSIS:  Same  PROCEDURE:  RELEASE TRIGGER FINGER/A-1 PULLEY  SURGEON:  Sheral Apley, MD  ASSISTANT: Levester Fresh, PA-C, he was present and scrubbed throughout the case, critical for completion in a timely fashion, and for retraction, instrumentation, and closure.   ANESTHESIA:   gen  PREOPERATIVE INDICATIONS:  [name] is a  62 y.o. female with a diagnosis of LEFT TRIGGER THUMB who failed conservative measures and elected for surgical management.    The risks benefits and alternatives were discussed with the patient preoperatively including but not limited to the risks of infection, bleeding, nerve injury, cardiopulmonary complications, the need for revision surgery, among others, and the patient was willing to proceed.  OPERATIVE IMPLANTS: none  OPERATIVE FINDINGS: a 1 pulley release  BLOOD LOSS: min  COMPLICATIONS: none  TOURNIQUET TIME:  OPERATIVE PROCEDURE:  Patient was identified in the preoperative holding area and site was marked by me She was transported to the operating theater and placed on the table in supine position taking care to pad all bony prominences. After a preincinduction time out anesthesia was induced. The left upper extremity was prepped and draped in normal sterile fashion and a pre-incision timeout was performed. She received ancef for preoperative antibiotics.   I made an incision over the A1 pulley and protected the NV structures throughout.   I then confirmed a complete release of the A1 pulley with a scissor and knife. I confirmed no adhesions of the flexor pulleys.   Incision was closed and dressing applied.   POST OPERATIVE PLAN: dressing for 3 days.

## 2023-07-23 ENCOUNTER — Encounter (HOSPITAL_BASED_OUTPATIENT_CLINIC_OR_DEPARTMENT_OTHER): Payer: Self-pay | Admitting: Orthopedic Surgery

## 2023-08-01 DIAGNOSIS — M65312 Trigger thumb, left thumb: Secondary | ICD-10-CM | POA: Diagnosis not present

## 2023-08-26 DIAGNOSIS — H6692 Otitis media, unspecified, left ear: Secondary | ICD-10-CM | POA: Diagnosis not present

## 2023-08-26 DIAGNOSIS — J018 Other acute sinusitis: Secondary | ICD-10-CM | POA: Diagnosis not present

## 2023-09-03 DIAGNOSIS — K7469 Other cirrhosis of liver: Secondary | ICD-10-CM | POA: Diagnosis not present

## 2023-09-03 DIAGNOSIS — Y92009 Unspecified place in unspecified non-institutional (private) residence as the place of occurrence of the external cause: Secondary | ICD-10-CM | POA: Diagnosis not present

## 2023-09-03 DIAGNOSIS — W19XXXA Unspecified fall, initial encounter: Secondary | ICD-10-CM | POA: Diagnosis not present

## 2023-09-10 ENCOUNTER — Ambulatory Visit (INDEPENDENT_AMBULATORY_CARE_PROVIDER_SITE_OTHER): Payer: Medicare Other | Admitting: Physician Assistant

## 2023-09-10 ENCOUNTER — Encounter: Payer: Self-pay | Admitting: Physician Assistant

## 2023-09-10 DIAGNOSIS — F3341 Major depressive disorder, recurrent, in partial remission: Secondary | ICD-10-CM | POA: Diagnosis not present

## 2023-09-10 DIAGNOSIS — F22 Delusional disorders: Secondary | ICD-10-CM

## 2023-09-10 DIAGNOSIS — F411 Generalized anxiety disorder: Secondary | ICD-10-CM

## 2023-09-10 MED ORDER — SERTRALINE HCL 100 MG PO TABS: ORAL_TABLET | ORAL | 1 refills | Status: DC

## 2023-09-10 NOTE — Progress Notes (Signed)
[facility]  Patient ID: Becky Wolfe,  MRN: [id number]  PCP: Lupita Raider, MD  Date of Evaluation: 09/10/2023 Time spent:20 minutes  Chief Complaint:  Chief Complaint   Follow-up    HISTORY/CURRENT STATUS: For routine med check.    Feels pretty good lately.  She's able to enjoy some things, especially being around her grandkids. Energy and motivation are fair to good depending on the day.   No feelings of hopelessness.  She does get sad sometimes when thinks about her grandson's health, since he was in the wreck several years ago.  But he's doing really well.  Still has the trach but he's walking, eating, acts happy most of the time.  Sleeps well most of the time.  ADLs and personal hygiene are normal.   Denies any changes in concentration, making decisions, or remembering things.  Appetite has not changed.  Weight is stable.  Still has anxiety at times, no PA though.  She only takes the Klonopin maybe once a day, never more than twice.  Denies suicidal or homicidal thoughts.  Patient denies increased energy with decreased need for sleep, increased talkativeness, racing thoughts, impulsivity or risky behaviors, increased spending, increased libido, grandiosity, increased irritability or anger, paranoia, or hallucinations.  Denies dizziness, syncope, seizures, numbness, tingling, tremor, tics, unsteady gait, slurred speech, confusion. Denies muscle or joint pain, stiffness, or dystonia. Denies unexplained weight loss, frequent infections, or sores that heal slowly.  No polyphagia, polydipsia, or polyuria. Denies visual changes or paresthesias.   Individual Medical History/ Review of Systems: Changes? :Yes she had trigger finger surgery on her left thumb      Past medications for mental health diagnoses include: Klonopin, Zoloft, Paxil, Celexa, Ativan, Ambien caused sleep talking, Lamictal she preferred not to take.  Allergies: Etodolac, Ambien [zolpidem tartrate],  and Namenda [memantine]  Current Medications:  Current Outpatient Medications:    acetaminophen (TYLENOL) 500 MG tablet, Take 2 tablets (1,000 mg total) by mouth every 6 (six) hours as needed for mild pain or moderate pain., Disp: 60 tablet, Rfl: 0   aspirin 81 MG chewable tablet, Chew by mouth daily., Disp: , Rfl:    buPROPion (WELLBUTRIN XL) 150 MG 24 hr tablet, Take 1 tablet (150 mg total) by mouth daily., Disp: 90 tablet, Rfl: 1   clonazePAM (KLONOPIN) 0.5 MG tablet, TAKE 1 TABLET(0.5 MG) BY MOUTH EVERY 6 HOURS AS NEEDED FOR ANXIETY (Patient taking differently: Take 0.5 mg by mouth every 6 (six) hours as needed for anxiety. She only takes it once a day usually.), Disp: 120 tablet, Rfl: 2   cloNIDine (CATAPRES) 0.1 MG tablet, Take 0.1 mg by mouth 2 (two) times daily., Disp: , Rfl:    clopidogrel (PLAVIX) 75 MG tablet, TAKE 1 TABLET(75 MG) BY MOUTH DAILY, Disp: 90 tablet, Rfl: 3   gabapentin (NEURONTIN) 800 MG tablet, Take 800 mg by mouth 3 (three) times daily., Disp: , Rfl: 1   hydrOXYzine (ATARAX) 10 MG tablet, Take 1 tablet (10 mg total) by mouth 3 (three) times daily., Disp: 90 tablet, Rfl: 5   methadone (DOLOPHINE) 5 MG tablet, Take 1 tablet (5 mg total) by mouth every 12 (twelve) hours., Disp: 30 tablet, Rfl: 0   ondansetron (ZOFRAN-ODT) 4 MG disintegrating tablet, Take 1 tablet (4 mg total) by mouth every 8 (eight) hours as needed for nausea or vomiting., Disp: 15 tablet, Rfl: 0   QUEtiapine (SEROQUEL XR) 400 MG 24 hr tablet, Take 2 tablets (800 mg total) by mouth at  bedtime., Disp: 180 tablet, Rfl: 1   traMADol (ULTRAM) 50 MG tablet, Take 1 tablet (50 mg total) by mouth every 12 (twelve) hours as needed for severe pain., Disp: 14 tablet, Rfl: 0   naloxone (NARCAN) nasal spray 4 mg/0.1 mL, Place 0.4 mg into the nose once as needed (opioid overdose). (Patient not taking: Reported on 03/19/2023), Disp: , Rfl:    sertraline (ZOLOFT) 100 MG tablet, TAKE 2 TABLETS(200 MG) BY MOUTH DAILY, Disp: 180  tablet, Rfl: 1 Medication Side Effects: none  Family Medical/ Social History: Changes? None  MENTAL HEALTH EXAM:  Last menstrual period 10/16/2011.There is no height or weight on file to calculate BMI.  General Appearance: Casual and Well Groomed  Eye Contact:  Good  Speech:  Clear and Coherent and Normal Rate  Volume:  Normal  Mood:  Euthymic  Affect:  Congruent  Thought Process:  Goal Directed and Descriptions of Associations: Circumstantial  Orientation:  Full (Time, Place, and Person)  Thought Content: Logical   Suicidal Thoughts:  No  Homicidal Thoughts:  No  Memory:  WNL  Judgement:  Fair  Insight:  Fair  Psychomotor Activity:  Normal  Concentration:  Concentration: Good and Attention Span: Fair  Recall:  Fair  Fund of Knowledge: Good  Language: Good  Assets:  Desire for Improvement Financial Resources/Insurance Housing Transportation  ADL's:  Intact  Cognition: WNL  Prognosis:  Fair    GAD-7    Flowsheet Row Office Visit from 07/03/2020 in [facility]  Total GAD-7 Score 19      Mini-Mental    Flowsheet Row Office Visit from 04/18/2023 in [facility]  Total Score (max 30 points ) 30      PHQ2-9    Flowsheet Row Office Visit from 07/03/2020 in [facility]  PHQ-2 Total Score 4  PHQ-9 Total Score 12      Flowsheet Row Admission (Discharged) from 07/22/2023 in [facility] ED from 05/17/2023 in [facility] Emergency Department at [facility] ED from 05/09/2023 in [facility] Department at [facility]  C-SSRS RISK CATEGORY No Risk No Risk No Risk      DIAGNOSES:    ICD-10-CM   1. Recurrent major depressive disorder, in partial remission (HCC)  F33.41     2. Paranoia (HCC)  F22     3. Generalized anxiety disorder  F41.1       Receiving Psychotherapy: No    Chari Manning, [facility] in the past.   RECOMMENDATIONS:  PDMP was reviewed. Last  Klonopin 08/08/2023.  Also gabapentin and methadone known to me. I provided  20 minutes of face to face time during this encounter, including time spent before and after the visit in records review, medical decision making, counseling pertinent to today's visit, and charting.   She did not bring up issues of someone coming in her house and moving things around.  Since she did not bring it up I did not ask about it, I feel like that is a good sign and she is no longer paranoid.  The Seroquel at this dose is beneficial.  She is stable so no medication changes are needed.  Continue Wellbutrin XL 150 mg, 1 p.o. every morning. Continue Klonopin 0.5 mg, 1 p.o. every 6 hours as needed anxiety.  Take as sparingly as possible. Continue clonidine 0.1 mg, 1 p.o. twice daily by another provider. Continue gabapentin 800 mg, 1 p.o. 3 times daily. Continue hydroxyzine 25  mg, 1 p.o. twice daily as needed. Continue Seroquel XR 400 mg, 2 po qhs.  Continue Zoloft 100 mg, 2 po qd.  Return in 3 months.  Melony Overly, PA-C

## 2023-09-12 DIAGNOSIS — G9050 Complex regional pain syndrome I, unspecified: Secondary | ICD-10-CM | POA: Diagnosis not present

## 2023-09-12 DIAGNOSIS — J432 Centrilobular emphysema: Secondary | ICD-10-CM | POA: Diagnosis not present

## 2023-09-12 DIAGNOSIS — G319 Degenerative disease of nervous system, unspecified: Secondary | ICD-10-CM | POA: Diagnosis not present

## 2023-09-12 DIAGNOSIS — I2510 Atherosclerotic heart disease of native coronary artery without angina pectoris: Secondary | ICD-10-CM | POA: Diagnosis not present

## 2023-09-12 DIAGNOSIS — I10 Essential (primary) hypertension: Secondary | ICD-10-CM | POA: Diagnosis not present

## 2023-09-12 DIAGNOSIS — Z23 Encounter for immunization: Secondary | ICD-10-CM | POA: Diagnosis not present

## 2023-09-12 DIAGNOSIS — K7460 Unspecified cirrhosis of liver: Secondary | ICD-10-CM | POA: Diagnosis not present

## 2023-09-12 DIAGNOSIS — G9340 Encephalopathy, unspecified: Secondary | ICD-10-CM | POA: Diagnosis not present

## 2023-09-15 DIAGNOSIS — H7292 Unspecified perforation of tympanic membrane, left ear: Secondary | ICD-10-CM | POA: Diagnosis not present

## 2023-09-16 ENCOUNTER — Other Ambulatory Visit: Payer: Self-pay

## 2023-09-16 ENCOUNTER — Encounter (HOSPITAL_COMMUNITY): Payer: Self-pay

## 2023-09-16 ENCOUNTER — Emergency Department (HOSPITAL_COMMUNITY)
Admission: EM | Admit: 2023-09-16 | Discharge: 2023-09-17 | Disposition: A | Discharge status: Home / Self Care | Payer: Medicare Other | Attending: Emergency Medicine | Admitting: Emergency Medicine

## 2023-09-16 ENCOUNTER — Emergency Department (HOSPITAL_COMMUNITY): Payer: Medicare Other

## 2023-09-16 DIAGNOSIS — W19XXXA Unspecified fall, initial encounter: Secondary | ICD-10-CM

## 2023-09-16 DIAGNOSIS — Z7982 Long term (current) use of aspirin: Secondary | ICD-10-CM | POA: Diagnosis not present

## 2023-09-16 DIAGNOSIS — Y92480 Sidewalk as the place of occurrence of the external cause: Secondary | ICD-10-CM | POA: Diagnosis not present

## 2023-09-16 DIAGNOSIS — S0083XA Contusion of other part of head, initial encounter: Secondary | ICD-10-CM

## 2023-09-16 DIAGNOSIS — S0990XA Unspecified injury of head, initial encounter: Secondary | ICD-10-CM | POA: Diagnosis not present

## 2023-09-16 DIAGNOSIS — Z8673 Personal history of transient ischemic attack (TIA), and cerebral infarction without residual deficits: Secondary | ICD-10-CM | POA: Insufficient documentation

## 2023-09-16 DIAGNOSIS — M25571 Pain in right ankle and joints of right foot: Secondary | ICD-10-CM | POA: Insufficient documentation

## 2023-09-16 DIAGNOSIS — S0993XA Unspecified injury of face, initial encounter: Secondary | ICD-10-CM

## 2023-09-16 DIAGNOSIS — S0003XA Contusion of scalp, initial encounter: Secondary | ICD-10-CM | POA: Diagnosis not present

## 2023-09-16 DIAGNOSIS — Z79899 Other long term (current) drug therapy: Secondary | ICD-10-CM | POA: Diagnosis not present

## 2023-09-16 DIAGNOSIS — M79604 Pain in right leg: Secondary | ICD-10-CM | POA: Diagnosis not present

## 2023-09-16 DIAGNOSIS — S0511XA Contusion of eyeball and orbital tissues, right eye, initial encounter: Secondary | ICD-10-CM | POA: Insufficient documentation

## 2023-09-16 DIAGNOSIS — S060XAA Concussion with loss of consciousness status unknown, initial encounter: Secondary | ICD-10-CM

## 2023-09-16 DIAGNOSIS — W010XXA Fall on same level from slipping, tripping and stumbling without subsequent striking against object, initial encounter: Secondary | ICD-10-CM | POA: Insufficient documentation

## 2023-09-16 DIAGNOSIS — Z7902 Long term (current) use of antithrombotics/antiplatelets: Secondary | ICD-10-CM | POA: Diagnosis not present

## 2023-09-16 DIAGNOSIS — M25511 Pain in right shoulder: Secondary | ICD-10-CM | POA: Diagnosis not present

## 2023-09-16 DIAGNOSIS — S060X0A Concussion without loss of consciousness, initial encounter: Secondary | ICD-10-CM | POA: Diagnosis not present

## 2023-09-16 DIAGNOSIS — S199XXA Unspecified injury of neck, initial encounter: Secondary | ICD-10-CM | POA: Diagnosis not present

## 2023-09-16 LAB — CBC WITH DIFFERENTIAL/PLATELET
Abs Immature Granulocytes: 0.01 10*3/uL (ref 0.00–0.07)
Basophils Absolute: 0 10*3/uL (ref 0.0–0.1)
Basophils Relative: 0 %
Eosinophils Absolute: 0.1 10*3/uL (ref 0.0–0.5)
Eosinophils Relative: 2 %
HCT: 36.3 % (ref 36.0–46.0)
Hemoglobin: 12.4 g/dL (ref 12.0–15.0)
Immature Granulocytes: 0 %
Lymphocytes Relative: 33 %
Lymphs Abs: 1.6 10*3/uL (ref 0.7–4.0)
MCH: 28.6 pg (ref 26.0–34.0)
MCHC: 34.2 g/dL (ref 30.0–36.0)
MCV: 83.6 fL (ref 80.0–100.0)
Monocytes Absolute: 0.3 10*3/uL (ref 0.1–1.0)
Monocytes Relative: 6 %
Neutro Abs: 2.8 10*3/uL (ref 1.7–7.7)
Neutrophils Relative %: 59 %
Platelets: 232 10*3/uL (ref 150–400)
RBC: 4.34 MIL/uL (ref 3.87–5.11)
RDW: 12.7 % (ref 11.5–15.5)
WBC: 4.8 10*3/uL (ref 4.0–10.5)
nRBC: 0 % (ref 0.0–0.2)

## 2023-09-16 LAB — COMPREHENSIVE METABOLIC PANEL
ALT: 25 U/L (ref 0–44)
AST: 33 U/L (ref 15–41)
Albumin: 3.9 g/dL (ref 3.5–5.0)
Alkaline Phosphatase: 87 U/L (ref 38–126)
Anion gap: 10 (ref 5–15)
BUN: 12 mg/dL (ref 8–23)
CO2: 25 mmol/L (ref 22–32)
Calcium: 9.1 mg/dL (ref 8.9–10.3)
Chloride: 106 mmol/L (ref 98–111)
Creatinine, Ser: 0.91 mg/dL (ref 0.44–1.00)
GFR, Estimated: >60 mL/min (ref >60)
Glucose, Bld: 107 mg/dL — ABNORMAL HIGH (ref 70–99)
Potassium: 3.8 mmol/L (ref 3.5–5.1)
Sodium: 141 mmol/L (ref 135–145)
Total Bilirubin: 0.6 mg/dL (ref <1.2)
Total Protein: 7.3 g/dL (ref 6.5–8.1)

## 2023-09-16 NOTE — ED Triage Notes (Signed)
Pt states she was leaving the store and her foot got caught and she fell face first on the pavement yesterday. Pt is on plavix. Pt has abrasions to right side of face and above upper lip. Pt c/o teeth hurting. Pt c/o pain in right leg, right ankle, lower back, right shoulder. Pt's husband states pt soft talking isn't her normal. Pt's husband states pt is having trouble getting around and has been fatigued.

## 2023-09-16 NOTE — ED Provider Triage Note (Signed)
Emergency Medicine Provider Triage Evaluation Note  Becky Wolfe , a 62 y.o. female  was evaluated in triage.  Pt arrives to ED for evaluation of head injury following fall yesterday afternoon.  She reports that she was walking when she tripped on the floor and fell face down on the concrete.  Her husband witnessed fall and reported that she fell face first onto concrete and took "a couple minutes to get up".  Unknown LOC.  Patient's husband denies seizure-like activity for after fall.  He reports that she has been "not right" with occasional slurred speech following fall.  She has periorbital ecchymosis and abrasions to right forehead.  She denies visual disturbances, nausea, vomiting.  She takes Plavix  Review of Systems  Positive: Headache, head injury Negative: Visual disturbance  Physical Exam  BP 131/72 (BP Location: Right Arm)   Pulse 78   Resp 18   LMP 10/16/2011   SpO2 95%  Gen:   Awake, no distress, A&Ox3 Resp:  Normal effort  MSK:   Moves extremities without difficulty.  Able to follow commands appropriately Other:  Periorbital ecchymosis  Medical Decision Making  Medically screening exam initiated at 6:43 PM.  Appropriate orders placed.  Becky Wolfe was informed that the remainder of the evaluation will be completed by another provider, this initial triage assessment does not replace that evaluation, and the importance of remaining in the ED until their evaluation is complete.  Lab work and imaging ordered   Judithann Sheen, [place] 09/16/23 [phone]

## 2023-09-17 ENCOUNTER — Emergency Department (HOSPITAL_COMMUNITY): Payer: Medicare Other

## 2023-09-17 DIAGNOSIS — I2510 Atherosclerotic heart disease of native coronary artery without angina pectoris: Secondary | ICD-10-CM | POA: Diagnosis not present

## 2023-09-17 DIAGNOSIS — Z9989 Dependence on other enabling machines and devices: Secondary | ICD-10-CM | POA: Diagnosis not present

## 2023-09-17 DIAGNOSIS — M25511 Pain in right shoulder: Secondary | ICD-10-CM | POA: Diagnosis not present

## 2023-09-17 DIAGNOSIS — Z9181 History of falling: Secondary | ICD-10-CM | POA: Diagnosis not present

## 2023-09-17 DIAGNOSIS — K0889 Other specified disorders of teeth and supporting structures: Secondary | ICD-10-CM | POA: Diagnosis not present

## 2023-09-17 DIAGNOSIS — K7460 Unspecified cirrhosis of liver: Secondary | ICD-10-CM | POA: Diagnosis not present

## 2023-09-17 DIAGNOSIS — M25571 Pain in right ankle and joints of right foot: Secondary | ICD-10-CM | POA: Diagnosis not present

## 2023-09-17 DIAGNOSIS — R270 Ataxia, unspecified: Secondary | ICD-10-CM | POA: Diagnosis not present

## 2023-09-17 DIAGNOSIS — R519 Headache, unspecified: Secondary | ICD-10-CM | POA: Diagnosis not present

## 2023-09-17 MED ORDER — OXYCODONE-ACETAMINOPHEN 5-325 MG PO TABS
1.0000 | ORAL_TABLET | Freq: Once | ORAL | Status: AC
  Administered 2023-09-17: 1 via ORAL
  Filled 2023-09-17: qty 1

## 2023-09-17 NOTE — ED Notes (Signed)
Pt in NAD at d/c from ED. A&O. Ambulatory. Respirations even & unlabored. Skin warm & dry. Pt verbalized understanding of d/c teaching including follow up care and reasons to return to the ED. No needs expressed at d/c.

## 2023-09-17 NOTE — ED Provider Notes (Signed)
[place] [facility]MENT AT [facility] Provider Note   CSN: [id number] Arrival date & time: 09/16/23  1831     History  Chief Complaint  Patient presents with   Marletta Lor    Becky Wolfe is a 62 y.o. female.  HPI     This is a 62 year old female who presents following a fall.  Patient reports following early on Monday morning.  She tripped and fell coming out of a store.  She fell onto her face.  She is on Plavix.  Husband reports that she is not been getting around well since the fall.  Also reports that she has been speaking very softly which is abnormal for her.  She reports significant headache.  Also reports right shoulder pain, right ankle pain and leg pain.  Home Medications Prior to Admission medications   Medication Sig Start Date End Date Taking? Authorizing Provider  acetaminophen (TYLENOL) 500 MG tablet Take 2 tablets (1,000 mg total) by mouth every 6 (six) hours as needed for mild pain or moderate pain. 07/22/23   Jenne Pane, PA-C  aspirin 81 MG chewable tablet Chew by mouth daily.    [provider]  buPROPion (WELLBUTRIN XL) 150 MG 24 hr tablet Take 1 tablet (150 mg total) by mouth daily. 03/19/23   Melony Overly T, PA-C  clonazePAM (KLONOPIN) 0.5 MG tablet TAKE 1 TABLET(0.5 MG) BY MOUTH EVERY 6 HOURS AS NEEDED FOR ANXIETY Patient taking differently: Take 0.5 mg by mouth every 6 (six) hours as needed for anxiety. She only takes it once a day usually. 06/30/23   Melony Overly T, PA-C  cloNIDine (CATAPRES) 0.1 MG tablet Take 0.1 mg by mouth 2 (two) times daily.    [provider]  clopidogrel (PLAVIX) 75 MG tablet TAKE 1 TABLET(75 MG) BY MOUTH DAILY 08/29/22   Sater, Pearletha Furl, MD  gabapentin (NEURONTIN) 800 MG tablet Take 800 mg by mouth 3 (three) times daily. 05/12/17   [provider]  hydrOXYzine (ATARAX) 10 MG tablet Take 1 tablet (10 mg total) by mouth 3 (three) times daily. 06/03/23   Sater, Pearletha Furl, MD  methadone  (DOLOPHINE) 5 MG tablet Take 1 tablet (5 mg total) by mouth every 12 (twelve) hours. 04/15/22   Joseph Art, DO  naloxone [facility]) nasal spray 4 mg/0.1 mL Place 0.4 mg into the nose once as needed (opioid overdose). Patient not taking: Reported on 03/19/2023 04/02/22   [provider]  ondansetron (ZOFRAN-ODT) 4 MG disintegrating tablet Take 1 tablet (4 mg total) by mouth every 8 (eight) hours as needed for nausea or vomiting. 07/22/23   Jenne Pane, PA-C  QUEtiapine (SEROQUEL XR) 400 MG 24 hr tablet Take 2 tablets (800 mg total) by mouth at bedtime. 06/11/23   Melony Overly T, PA-C  sertraline (ZOLOFT) 100 MG tablet TAKE 2 TABLETS(200 MG) BY MOUTH DAILY 09/10/23   Melony Overly T, PA-C  traMADol (ULTRAM) 50 MG tablet Take 1 tablet (50 mg total) by mouth every 12 (twelve) hours as needed for severe pain. 07/22/23 07/21/24  Jenne Pane, PA-C      Allergies    Etodolac, Ambien [zolpidem tartrate], and Namenda [memantine]    Review of Systems   Review of Systems  Constitutional:  Negative for fever.  Skin:  Positive for wound.  Neurological:  Positive for headaches.  All other systems reviewed and are negative.   Physical Exam Updated Vital Signs BP 136/74   Pulse 66  Temp 98 F (36.7 C)   Resp 18   Ht 1.575 m (5\' 2" )   Wt 69.3 kg   LMP 10/16/2011   SpO2 98%   BMI 27.94 kg/m  Physical Exam Vitals and nursing note reviewed.  Constitutional:      Appearance: She is well-developed.  HENT:     Head: Normocephalic.     Comments: Contusion noted right orbit, right cheek, right upper lip, associated abrasions noted Eyes:     Extraocular Movements: Extraocular movements intact.     Pupils: Pupils are equal, round, and reactive to light.  Cardiovascular:     Rate and Rhythm: Normal rate and regular rhythm.     Heart sounds: Normal heart sounds.  Pulmonary:     Effort: Pulmonary effort is normal. No respiratory distress.     Breath sounds: No wheezing.  Abdominal:      Palpations: Abdomen is soft.     Tenderness: There is no abdominal tenderness.  Musculoskeletal:     Cervical back: Neck supple.     Comments: Normal range of motion left shoulder, no gross deformity, no obvious dislocation Normal range of motion of bilateral hips and knees  Skin:    General: Skin is warm and dry.  Neurological:     Mental Status: She is alert and oriented to person, place, and time.     ED Results / Procedures / Treatments   Labs (all labs ordered are listed, but only abnormal results are displayed) Labs Reviewed  COMPREHENSIVE METABOLIC PANEL - Abnormal; Notable for the following components:      Result Value   Glucose, Bld 107 (*)    All other components within normal limits  CBC WITH DIFFERENTIAL/PLATELET    EKG None  Radiology DG Shoulder Right  Result Date: 09/17/2023 CLINICAL DATA:  Right shoulder pain, status post fall EXAM: RIGHT SHOULDER - 2+ VIEW COMPARISON:  None Available. FINDINGS: No fracture or dislocation is seen. The joint spaces are preserved. Visualized soft tissues are within normal limits. Visualized right lung is clear. IMPRESSION: Negative. Electronically Signed   By: Charline Bills M.D.   On: 09/17/2023 01:26   DG Ankle Complete Right  Result Date: 09/17/2023 CLINICAL DATA:  Status post fall, ankle pain EXAM: RIGHT ANKLE - COMPLETE 3+ VIEW COMPARISON:  None Available. FINDINGS: No fracture or dislocation is seen. The ankle mortise is intact. The base of the fifth metatarsal is unremarkable. Visualized soft tissues are within normal limits. IMPRESSION: Negative. Electronically Signed   By: Charline Bills M.D.   On: 09/17/2023 01:25   CT Head Wo Contrast  Result Date: 09/16/2023 CLINICAL DATA:  Trauma EXAM: CT HEAD WITHOUT CONTRAST CT MAXILLOFACIAL WITHOUT CONTRAST CT CERVICAL SPINE WITHOUT CONTRAST TECHNIQUE: Multidetector CT imaging of the head, cervical spine, and maxillofacial structures were performed using the standard  protocol without intravenous contrast. Multiplanar CT image reconstructions of the cervical spine and maxillofacial structures were also generated. RADIATION DOSE REDUCTION: This exam was performed according to the departmental dose-optimization program which includes automated exposure control, adjustment of the mA and/or kV according to patient size and/or use of iterative reconstruction technique. COMPARISON:  Brain MRI 05/17/2023 FINDINGS: CT HEAD FINDINGS Brain: There is no mass, hemorrhage or extra-axial collection. The size and configuration of the ventricles and extra-axial CSF spaces are normal. There is hypoattenuation of the periventricular white matter, most commonly indicating chronic ischemic microangiopathy. Vascular: No abnormal hyperdensity of the major intracranial arteries or dural venous sinuses. No intracranial atherosclerosis. Skull:  Small right frontal scalp hematoma.  No skull fracture. CT MAXILLOFACIAL FINDINGS Osseous: No facial fracture or mandibular dislocation. Orbits: The globes are intact. Normal appearance of the intra- and extraconal fat. Symmetric extraocular muscles and optic nerves. Sinuses: No fluid levels or advanced mucosal thickening. Left mastoid effusion. Soft tissues: Normal visualized extracranial soft tissues. CT CERVICAL SPINE FINDINGS Alignment: No static subluxation. Facets are aligned. Occipital condyles and the lateral masses of C1-C2 are aligned. Skull base and vertebrae: No acute fracture. Anterior fusion at C4-T1 with screw and plate at J4-7 and C7-T1. Soft tissues and spinal canal: No prevertebral fluid or swelling. No visible canal hematoma. Disc levels: No advanced spinal canal or neural foraminal stenosis. Upper chest: No pneumothorax, pulmonary nodule or pleural effusion. Other: Normal visualized paraspinal cervical soft tissues. IMPRESSION: 1. No acute intracranial abnormality. 2. Small right frontal scalp hematoma without skull fracture. 3. No facial  fracture. 4. No acute fracture or static subluxation of the cervical spine. Electronically Signed   By: Deatra Robinson M.D.   On: 09/16/2023 21:50   CT Cervical Spine Wo Contrast  Result Date: 09/16/2023 CLINICAL DATA:  Trauma EXAM: CT HEAD WITHOUT CONTRAST CT MAXILLOFACIAL WITHOUT CONTRAST CT CERVICAL SPINE WITHOUT CONTRAST TECHNIQUE: Multidetector CT imaging of the head, cervical spine, and maxillofacial structures were performed using the standard protocol without intravenous contrast. Multiplanar CT image reconstructions of the cervical spine and maxillofacial structures were also generated. RADIATION DOSE REDUCTION: This exam was performed according to the departmental dose-optimization program which includes automated exposure control, adjustment of the mA and/or kV according to patient size and/or use of iterative reconstruction technique. COMPARISON:  Brain MRI 05/17/2023 FINDINGS: CT HEAD FINDINGS Brain: There is no mass, hemorrhage or extra-axial collection. The size and configuration of the ventricles and extra-axial CSF spaces are normal. There is hypoattenuation of the periventricular white matter, most commonly indicating chronic ischemic microangiopathy. Vascular: No abnormal hyperdensity of the major intracranial arteries or dural venous sinuses. No intracranial atherosclerosis. Skull: Small right frontal scalp hematoma.  No skull fracture. CT MAXILLOFACIAL FINDINGS Osseous: No facial fracture or mandibular dislocation. Orbits: The globes are intact. Normal appearance of the intra- and extraconal fat. Symmetric extraocular muscles and optic nerves. Sinuses: No fluid levels or advanced mucosal thickening. Left mastoid effusion. Soft tissues: Normal visualized extracranial soft tissues. CT CERVICAL SPINE FINDINGS Alignment: No static subluxation. Facets are aligned. Occipital condyles and the lateral masses of C1-C2 are aligned. Skull base and vertebrae: No acute fracture. Anterior fusion at C4-T1  with screw and plate at W2-9 and C7-T1. Soft tissues and spinal canal: No prevertebral fluid or swelling. No visible canal hematoma. Disc levels: No advanced spinal canal or neural foraminal stenosis. Upper chest: No pneumothorax, pulmonary nodule or pleural effusion. Other: Normal visualized paraspinal cervical soft tissues. IMPRESSION: 1. No acute intracranial abnormality. 2. Small right frontal scalp hematoma without skull fracture. 3. No facial fracture. 4. No acute fracture or static subluxation of the cervical spine. Electronically Signed   By: Deatra Robinson M.D.   On: 09/16/2023 21:50   CT MAXILLOFACIAL WO CONTRAST  Result Date: 09/16/2023 CLINICAL DATA:  Trauma EXAM: CT HEAD WITHOUT CONTRAST CT MAXILLOFACIAL WITHOUT CONTRAST CT CERVICAL SPINE WITHOUT CONTRAST TECHNIQUE: Multidetector CT imaging of the head, cervical spine, and maxillofacial structures were performed using the standard protocol without intravenous contrast. Multiplanar CT image reconstructions of the cervical spine and maxillofacial structures were also generated. RADIATION DOSE REDUCTION: This exam was performed according to the departmental dose-optimization program which includes  automated exposure control, adjustment of the mA and/or kV according to patient size and/or use of iterative reconstruction technique. COMPARISON:  Brain MRI 05/17/2023 FINDINGS: CT HEAD FINDINGS Brain: There is no mass, hemorrhage or extra-axial collection. The size and configuration of the ventricles and extra-axial CSF spaces are normal. There is hypoattenuation of the periventricular white matter, most commonly indicating chronic ischemic microangiopathy. Vascular: No abnormal hyperdensity of the major intracranial arteries or dural venous sinuses. No intracranial atherosclerosis. Skull: Small right frontal scalp hematoma.  No skull fracture. CT MAXILLOFACIAL FINDINGS Osseous: No facial fracture or mandibular dislocation. Orbits: The globes are intact.  Normal appearance of the intra- and extraconal fat. Symmetric extraocular muscles and optic nerves. Sinuses: No fluid levels or advanced mucosal thickening. Left mastoid effusion. Soft tissues: Normal visualized extracranial soft tissues. CT CERVICAL SPINE FINDINGS Alignment: No static subluxation. Facets are aligned. Occipital condyles and the lateral masses of C1-C2 are aligned. Skull base and vertebrae: No acute fracture. Anterior fusion at C4-T1 with screw and plate at Y7-8 and C7-T1. Soft tissues and spinal canal: No prevertebral fluid or swelling. No visible canal hematoma. Disc levels: No advanced spinal canal or neural foraminal stenosis. Upper chest: No pneumothorax, pulmonary nodule or pleural effusion. Other: Normal visualized paraspinal cervical soft tissues. IMPRESSION: 1. No acute intracranial abnormality. 2. Small right frontal scalp hematoma without skull fracture. 3. No facial fracture. 4. No acute fracture or static subluxation of the cervical spine. Electronically Signed   By: Deatra Robinson M.D.   On: 09/16/2023 21:50    Procedures Procedures    Medications Ordered in ED Medications  oxyCODONE-acetaminophen (PERCOCET/ROXICET) 5-325 MG per tablet 1 tablet (1 tablet Oral Given 09/17/23 0132)    ED Course/ Medical Decision Making/ A&P                                 Medical Decision Making Amount and/or Complexity of Data Reviewed Radiology: ordered.  Risk Prescription drug management.   This patient presents to the ED for concern of headache, body pain, this involves an extensive number of treatment options, and is a complaint that carries with it a high risk of complications and morbidity.  I considered the following differential and admission for this acute, potentially life threatening condition.  The differential diagnosis includes acute traumatic injury such as subdural, contusion, concussion, long bone injury  MDM:    This is a 62 year old female who presents  following a fall.  Fall happened approximately 36 hours prior to evaluation.  She is on Plavix.  She has obvious facial trauma.  Also complaining of right arm and right leg today.  ABCs are intact and vital signs are reassuring.  CT head, neck, max face were reviewed and are largely reassuring.  X-rays obtained of extremities which are without any obvious fracture or dislocation.  She is ambulatory independently.  I suspect with headache and slurred speech, she may have some concussion.  No evidence of stroke.  No focal deficits.  Supportive management recommended.  (Labs, imaging, consults)  Labs: I Ordered, and personally interpreted labs.  The pertinent results include: CBC, CMP  Imaging Studies ordered: I ordered imaging studies including CT head, neck, max face, ankle, shoulder I independently visualized and interpreted imaging. I agree with the radiologist interpretation  Additional history obtained from chart review.  External records from outside source obtained and reviewed including prior evaluations  Cardiac Monitoring: The patient was not maintained  on a cardiac monitor.  If on the cardiac monitor, I personally viewed and interpreted the cardiac monitored which showed an underlying rhythm of: N/A  Reevaluation: After the interventions noted above, I reevaluated the patient and found that they have :improved  Social Determinants of Health:  lives independently  Disposition: Discharge  Co morbidities that complicate the patient evaluation  Past Medical History:  Diagnosis Date   Anxiety 06/16/2013   hx. panic attacks, none recent   Blood transfusion without reported diagnosis    20 yrs ago after childbirth   Bronchitis    hx of   Chronic neck pain    Constipation    Depression    DVT (deep venous thrombosis) (HCC) 2015   rt. leg    Headache(784.0)    migraines-has decreased   Heart murmur    Dr. Dione Housekeeper   Hepatitis C    dx. Hep. C(s/p transfusion age 70) low Hgb   .-multiple transfusions. Treated successfully   Multiple sclerosis (HCC)    dx. -28yrs ago, then med stopped-due to liver enzymes changes,occ. periods of weakness in arms"d.  was seen at [facility] and tolded that she does not have MSrops things"   Pneumonia    hx of , ? mild emphysema on CT scan 5'14, multiple pulmonary nodules   RSD (reflex sympathetic dystrophy) 06/16/2013   legs   RSD (reflex sympathetic dystrophy) 2009   TIA (transient ischemic attack)    hx of (weakness left side)-none in 1 yr     Medicines Meds ordered this encounter  Medications   oxyCODONE-acetaminophen (PERCOCET/ROXICET) 5-325 MG per tablet 1 tablet    I have reviewed the patients home medicines and have made adjustments as needed  Problem List / ED Course: Problem List Items Addressed This Visit   None Visit Diagnoses     Facial trauma, initial encounter    -  Primary   Relevant Orders   CT MAXILLOFACIAL WO CONTRAST (Completed)   Fall, initial encounter       Contusion of face, initial encounter       Concussion with unknown loss of consciousness status, initial encounter                       Final Clinical Impression(s) / ED Diagnoses Final diagnoses:  Fall, initial encounter  Contusion of face, initial encounter  Concussion with unknown loss of consciousness status, initial encounter    Rx / DC Orders ED Discharge Orders     None         Teller Wakefield, Mayer Masker, MD 09/17/23 [phone]

## 2023-09-17 NOTE — ED Notes (Signed)
Pt ambulated in hall with tech. Pt denies complaints with ambulating and states she is ready to go home. Pt & family state she is ambulating at baseline.

## 2023-09-17 NOTE — Discharge Instructions (Signed)
You were seen today after a fall.  Your x-rays are negative.  Your head CT does not show intracranial trauma.  You may have sustained a mild concussion.  Make sure that you are getting plenty of rest.  Take Tylenol as needed for headache.  Follow-up with your primary doctor.  You may apply ice to the bruising on your face.

## 2023-09-25 DIAGNOSIS — Z79899 Other long term (current) drug therapy: Secondary | ICD-10-CM | POA: Diagnosis not present

## 2023-09-25 DIAGNOSIS — M5412 Radiculopathy, cervical region: Secondary | ICD-10-CM | POA: Diagnosis not present

## 2023-09-25 DIAGNOSIS — G894 Chronic pain syndrome: Secondary | ICD-10-CM | POA: Diagnosis not present

## 2023-09-25 DIAGNOSIS — M961 Postlaminectomy syndrome, not elsewhere classified: Secondary | ICD-10-CM | POA: Diagnosis not present

## 2023-10-02 ENCOUNTER — Other Ambulatory Visit: Payer: Self-pay | Admitting: Physician Assistant

## 2023-10-16 ENCOUNTER — Other Ambulatory Visit: Payer: Self-pay | Admitting: Physician Assistant

## 2023-10-31 ENCOUNTER — Institutional Professional Consult (permissible substitution) (INDEPENDENT_AMBULATORY_CARE_PROVIDER_SITE_OTHER): Payer: Medicare Other

## 2023-11-14 ENCOUNTER — Other Ambulatory Visit: Payer: Self-pay | Admitting: Physician Assistant

## 2023-11-14 NOTE — Telephone Encounter (Signed)
 Lf 12/6

## 2023-11-18 ENCOUNTER — Other Ambulatory Visit: Payer: Self-pay | Admitting: Nurse Practitioner

## 2023-11-18 DIAGNOSIS — K7469 Other cirrhosis of liver: Secondary | ICD-10-CM

## 2023-11-21 ENCOUNTER — Other Ambulatory Visit: Payer: Self-pay | Admitting: Neurology

## 2023-11-24 ENCOUNTER — Ambulatory Visit
Admission: RE | Admit: 2023-11-24 | Discharge: 2023-11-24 | Disposition: A | Discharge status: Home / Self Care | Payer: Medicare Other | Source: Ambulatory Visit | Attending: Nurse Practitioner

## 2023-11-24 DIAGNOSIS — K7460 Unspecified cirrhosis of liver: Secondary | ICD-10-CM | POA: Diagnosis not present

## 2023-11-24 DIAGNOSIS — K7469 Other cirrhosis of liver: Secondary | ICD-10-CM

## 2023-11-25 ENCOUNTER — Other Ambulatory Visit: Payer: Self-pay

## 2023-11-25 ENCOUNTER — Other Ambulatory Visit: Payer: Self-pay | Admitting: Neurology

## 2023-11-25 DIAGNOSIS — G894 Chronic pain syndrome: Secondary | ICD-10-CM | POA: Diagnosis not present

## 2023-11-25 DIAGNOSIS — Z79899 Other long term (current) drug therapy: Secondary | ICD-10-CM | POA: Diagnosis not present

## 2023-11-25 DIAGNOSIS — M961 Postlaminectomy syndrome, not elsewhere classified: Secondary | ICD-10-CM | POA: Diagnosis not present

## 2023-11-25 DIAGNOSIS — M5412 Radiculopathy, cervical region: Secondary | ICD-10-CM | POA: Diagnosis not present

## 2023-11-25 DIAGNOSIS — Z5181 Encounter for therapeutic drug level monitoring: Secondary | ICD-10-CM | POA: Diagnosis not present

## 2023-11-25 DIAGNOSIS — G90522 Complex regional pain syndrome I of left lower limb: Secondary | ICD-10-CM | POA: Diagnosis not present

## 2023-11-25 MED ORDER — CLOPIDOGREL BISULFATE 75 MG PO TABS: 75.0000 mg | ORAL_TABLET | Freq: Every day | ORAL | 0 refills | Status: DC

## 2023-12-04 ENCOUNTER — Telehealth (INDEPENDENT_AMBULATORY_CARE_PROVIDER_SITE_OTHER): Payer: Self-pay | Admitting: Otolaryngology

## 2023-12-04 NOTE — Telephone Encounter (Signed)
Spoke with patient spouse and confirmed address for 12/05/23.

## 2023-12-05 ENCOUNTER — Institutional Professional Consult (permissible substitution) (INDEPENDENT_AMBULATORY_CARE_PROVIDER_SITE_OTHER): Payer: Medicare Other

## 2023-12-10 ENCOUNTER — Encounter: Payer: Self-pay | Admitting: Physician Assistant

## 2023-12-10 ENCOUNTER — Ambulatory Visit (INDEPENDENT_AMBULATORY_CARE_PROVIDER_SITE_OTHER): Payer: Medicare Other | Admitting: Physician Assistant

## 2023-12-10 DIAGNOSIS — F4310 Post-traumatic stress disorder, unspecified: Secondary | ICD-10-CM | POA: Diagnosis not present

## 2023-12-10 DIAGNOSIS — F411 Generalized anxiety disorder: Secondary | ICD-10-CM | POA: Diagnosis not present

## 2023-12-10 DIAGNOSIS — F3341 Major depressive disorder, recurrent, in partial remission: Secondary | ICD-10-CM

## 2023-12-10 DIAGNOSIS — F22 Delusional disorders: Secondary | ICD-10-CM

## 2023-12-10 DIAGNOSIS — R443 Hallucinations, unspecified: Secondary | ICD-10-CM

## 2023-12-10 MED ORDER — QUETIAPINE FUMARATE ER 400 MG PO TB24: 800.0000 mg | ORAL_TABLET | Freq: Every day | ORAL | 1 refills | Status: DC

## 2023-12-10 MED ORDER — QUETIAPINE FUMARATE ER 200 MG PO TB24
200.0000 mg | ORAL_TABLET | Freq: Every day | ORAL | 1 refills | Status: DC
Start: 2023-12-10 — End: 2024-02-08
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
## 2023-12-11 ENCOUNTER — Ambulatory Visit: Payer: Medicare Other | Admitting: Neurology

## 2023-12-11 ENCOUNTER — Telehealth: Payer: Self-pay | Admitting: Neurology

## 2023-12-11 NOTE — Telephone Encounter (Signed)
r/s appointment, pt sick

## 2023-12-15 ENCOUNTER — Other Ambulatory Visit: Payer: Self-pay | Admitting: Physician Assistant

## 2023-12-16 NOTE — Telephone Encounter (Signed)
 Lf 1/3

## 2023-12-22 ENCOUNTER — Encounter (INDEPENDENT_AMBULATORY_CARE_PROVIDER_SITE_OTHER): Payer: Self-pay | Admitting: Otolaryngology

## 2023-12-22 ENCOUNTER — Other Ambulatory Visit: Payer: Self-pay

## 2023-12-22 ENCOUNTER — Emergency Department (HOSPITAL_BASED_OUTPATIENT_CLINIC_OR_DEPARTMENT_OTHER): Payer: Medicare Other

## 2023-12-22 ENCOUNTER — Encounter (HOSPITAL_BASED_OUTPATIENT_CLINIC_OR_DEPARTMENT_OTHER): Payer: Self-pay | Admitting: Emergency Medicine

## 2023-12-22 ENCOUNTER — Emergency Department (HOSPITAL_BASED_OUTPATIENT_CLINIC_OR_DEPARTMENT_OTHER)
Admission: EM | Admit: 2023-12-22 | Discharge: 2023-12-22 | Disposition: A | Discharge status: Home / Self Care | Payer: Medicare Other | Attending: Emergency Medicine | Admitting: Emergency Medicine

## 2023-12-22 DIAGNOSIS — Z981 Arthrodesis status: Secondary | ICD-10-CM | POA: Diagnosis not present

## 2023-12-22 DIAGNOSIS — Z7982 Long term (current) use of aspirin: Secondary | ICD-10-CM | POA: Diagnosis not present

## 2023-12-22 DIAGNOSIS — Z7902 Long term (current) use of antithrombotics/antiplatelets: Secondary | ICD-10-CM | POA: Diagnosis not present

## 2023-12-22 DIAGNOSIS — Z8673 Personal history of transient ischemic attack (TIA), and cerebral infarction without residual deficits: Secondary | ICD-10-CM | POA: Diagnosis not present

## 2023-12-22 DIAGNOSIS — W1809XA Striking against other object with subsequent fall, initial encounter: Secondary | ICD-10-CM | POA: Insufficient documentation

## 2023-12-22 DIAGNOSIS — M79631 Pain in right forearm: Secondary | ICD-10-CM | POA: Diagnosis not present

## 2023-12-22 DIAGNOSIS — S0990XA Unspecified injury of head, initial encounter: Secondary | ICD-10-CM | POA: Diagnosis present

## 2023-12-22 DIAGNOSIS — M5031 Other cervical disc degeneration,  high cervical region: Secondary | ICD-10-CM | POA: Diagnosis not present

## 2023-12-22 DIAGNOSIS — Z23 Encounter for immunization: Secondary | ICD-10-CM | POA: Insufficient documentation

## 2023-12-22 DIAGNOSIS — S5011XA Contusion of right forearm, initial encounter: Secondary | ICD-10-CM | POA: Diagnosis not present

## 2023-12-22 DIAGNOSIS — Z043 Encounter for examination and observation following other accident: Secondary | ICD-10-CM | POA: Diagnosis not present

## 2023-12-22 DIAGNOSIS — J984 Other disorders of lung: Secondary | ICD-10-CM | POA: Diagnosis not present

## 2023-12-22 DIAGNOSIS — R9082 White matter disease, unspecified: Secondary | ICD-10-CM | POA: Diagnosis not present

## 2023-12-22 DIAGNOSIS — M79621 Pain in right upper arm: Secondary | ICD-10-CM | POA: Diagnosis not present

## 2023-12-22 DIAGNOSIS — W19XXXA Unspecified fall, initial encounter: Secondary | ICD-10-CM

## 2023-12-22 DIAGNOSIS — R519 Headache, unspecified: Secondary | ICD-10-CM | POA: Diagnosis not present

## 2023-12-22 MED ORDER — LIDOCAINE 5 % EX PTCH: 1.0000 | MEDICATED_PATCH | CUTANEOUS | 0 refills | Status: DC

## 2023-12-22 MED ORDER — LIDOCAINE 5 % EX PTCH: 1.0000 | MEDICATED_PATCH | CUTANEOUS | Status: DC

## 2023-12-22 MED ORDER — TETANUS-DIPHTH-ACELL PERTUSSIS 5-2.5-18.5 LF-MCG/0.5 IM SUSY
0.5000 mL | PREFILLED_SYRINGE | Freq: Once | INTRAMUSCULAR | Status: AC
  Administered 2023-12-22: 0.5 mL via INTRAMUSCULAR
  Filled 2023-12-22: qty 0.5

## 2023-12-22 NOTE — ED Notes (Signed)
 On review of PTA meds pt has Plavix  listed. EDP aware. Pt states last dose yesterday.

## 2023-12-22 NOTE — ED Triage Notes (Signed)
 Pt presents s/p mechanical fall ~1.5-2 hrs ago. She states she fell due to a cabinet door being open. Reports pain to R arm and neck. Denies thinners. Pinpoints pain to upper forearm. Pt appears sleepy but is answering questions.

## 2023-12-22 NOTE — ED Provider Notes (Signed)
[place] [facility] Provider Note   CSN: [id number] Arrival date & time: 12/22/23  2952     History  Chief Complaint  Patient presents with   Marletta Lor    Becky Wolfe is a 63 y.o. female.  The history is provided by the patient and the spouse.  Fall This is a recurrent problem. Episode onset: 2 hours ago. The problem occurs rarely. The problem has been resolved. Pertinent negatives include no chest pain, no abdominal pain, no headaches and no shortness of breath. Nothing aggravates the symptoms. Nothing relieves the symptoms. The treatment provided no relief.  Patient with anxiety, chronic pain and RSD presents post fall with right forearm pain.  She reports hitting arm on cabinet and falling onto the left side.  Husband did not witness fall   Past Medical History:  Diagnosis Date   Anxiety 06/16/2013   hx. panic attacks, none recent   Blood transfusion without reported diagnosis    20 yrs ago after childbirth   Bronchitis    hx of   Chronic neck pain    Constipation    Depression    DVT (deep venous thrombosis) (HCC) 2015   rt. leg    Headache(784.0)    migraines-has decreased   Heart murmur    Dr. Dione Housekeeper   Hepatitis C    dx. Hep. C(s/p transfusion age 76) low Hgb  .-multiple transfusions. Treated successfully   Multiple sclerosis (HCC)    dx. -14yrs ago, then med stopped-due to liver enzymes changes,occ. periods of weakness in arms"d.  was seen at [facility] and tolded that she does not have MSrops things"   Pneumonia    hx of , ? mild emphysema on CT scan 5'14, multiple pulmonary nodules   RSD (reflex sympathetic dystrophy) 06/16/2013   legs   RSD (reflex sympathetic dystrophy) 2009   TIA (transient ischemic attack)    hx of (weakness left side)-none in 1 yr       Home Medications Prior to Admission medications   Medication Sig Start Date End Date Taking? Authorizing Provider  acetaminophen (TYLENOL) 500 MG tablet Take 2  tablets (1,000 mg total) by mouth every 6 (six) hours as needed for mild pain or moderate pain. 07/22/23   Jenne Pane, PA-C  aspirin 81 MG chewable tablet Chew by mouth daily.    [provider]  buPROPion (WELLBUTRIN XL) 150 MG 24 hr tablet TAKE 1 TABLET(150 MG) BY MOUTH DAILY 10/02/23   Hurst, Rosey Bath T, PA-C  clonazePAM (KLONOPIN) 0.5 MG tablet TAKE 1 TABLET(0.5 MG) BY MOUTH EVERY 6 HOURS AS NEEDED FOR ANXIETY 12/16/23   Melony Overly T, PA-C  cloNIDine (CATAPRES) 0.1 MG tablet Take 0.1 mg by mouth 2 (two) times daily.    [provider]  clopidogrel (PLAVIX) 75 MG tablet Take 1 tablet (75 mg total) by mouth daily. 11/25/23   Sater, Pearletha Furl, MD  gabapentin (NEURONTIN) 800 MG tablet Take 800 mg by mouth 3 (three) times daily. 05/12/17   [provider]  hydrOXYzine (ATARAX) 10 MG tablet Take 1 tablet (10 mg total) by mouth 3 (three) times daily. 06/03/23   Sater, Pearletha Furl, MD  methadone (DOLOPHINE) 5 MG tablet Take 1 tablet (5 mg total) by mouth every 12 (twelve) hours. 04/15/22   Joseph Art, DO  naloxone [facility]) nasal spray 4 mg/0.1 mL Place 0.4 mg into the nose once as needed (opioid overdose). Patient not taking: Reported on 03/19/2023 04/02/22  [provider]  ondansetron (ZOFRAN-ODT) 4 MG disintegrating tablet Take 1 tablet (4 mg total) by mouth every 8 (eight) hours as needed for nausea or vomiting. 07/22/23   Jenne Pane, PA-C  QUEtiapine (SEROQUEL XR) 200 MG 24 hr tablet Take 1 tablet (200 mg total) by mouth at bedtime. Take with the Seroquel XR 800 mg=1,000 mg. 12/10/23   Melony Overly T, PA-C  QUEtiapine (SEROQUEL XR) 400 MG 24 hr tablet Take 2 tablets (800 mg total) by mouth at bedtime. 12/10/23   Melony Overly T, PA-C  sertraline (ZOLOFT) 100 MG tablet TAKE 2 TABLETS(200 MG) BY MOUTH DAILY 09/10/23   Melony Overly T, PA-C  traMADol (ULTRAM) 50 MG tablet Take 1 tablet (50 mg total) by mouth every 12 (twelve) hours as needed for severe pain. 07/22/23  07/21/24  Jenne Pane, PA-C      Allergies    Etodolac, Ambien [zolpidem tartrate], and Namenda [memantine]    Review of Systems   Review of Systems  Respiratory:  Negative for shortness of breath.   Cardiovascular:  Negative for chest pain.  Gastrointestinal:  Negative for abdominal pain.  Musculoskeletal:  Positive for arthralgias.  Neurological:  Negative for headaches.  All other systems reviewed and are negative.   Physical Exam Updated Vital Signs BP 122/70   Pulse 60   Temp 97.8 F (36.6 C) (Oral)   Ht 5\' 2"  (1.575 m)   Wt 63.5 kg   LMP 10/16/2011   SpO2 95%   BMI 25.61 kg/m  Physical Exam Vitals and nursing note reviewed. Exam conducted with a chaperone present.  Constitutional:      General: She is not in acute distress.    Appearance: She is well-developed.     Comments: Sleepy but easily arousable   HENT:     Head: Normocephalic and atraumatic.     Nose: Nose normal.  Eyes:     Pupils: Pupils are equal, round, and reactive to light.  Cardiovascular:     Rate and Rhythm: Normal rate and regular rhythm.     Pulses: Normal pulses.     Heart sounds: Normal heart sounds.  Pulmonary:     Effort: Pulmonary effort is normal. No respiratory distress.     Breath sounds: Normal breath sounds.  Abdominal:     General: Bowel sounds are normal. There is no distension.     Palpations: Abdomen is soft.     Tenderness: There is no abdominal tenderness. There is no guarding or rebound.  Musculoskeletal:        General: Normal range of motion.     Right shoulder: Normal.     Left shoulder: Normal.     Right upper arm: Normal.     Left upper arm: Normal.     Right elbow: Normal.     Left elbow: Normal.     Right forearm: No deformity or bony tenderness.     Left forearm: No deformity or bony tenderness.     Right wrist: No bony tenderness, snuff box tenderness or crepitus. Normal range of motion. Normal pulse.     Left wrist: No bony tenderness, snuff box  tenderness or crepitus. Normal range of motion. Normal pulse.     Right hand: Normal.     Left hand: Normal.       Arms:     Cervical back: Normal, normal range of motion and neck supple. No deformity, rigidity, tenderness or crepitus.     Thoracic back: Normal.  No deformity or tenderness.     Lumbar back: Normal.     Right knee: No LCL laxity, MCL laxity, ACL laxity or PCL laxity. Normal pulse.     Left knee: No LCL laxity, MCL laxity, ACL laxity or PCL laxity.Normal pulse.     Right ankle: Normal.     Right Achilles Tendon: Normal.     Left ankle: Normal.     Left Achilles Tendon: Normal.  Lymphadenopathy:     Cervical: No cervical adenopathy.  Skin:    General: Skin is warm and dry.     Capillary Refill: Capillary refill takes less than 2 seconds.     Findings: No erythema or rash.  Neurological:     General: No focal deficit present.     Deep Tendon Reflexes: Reflexes normal.  Psychiatric:        Mood and Affect: Mood normal.     ED Results / Procedures / Treatments   Labs (all labs ordered are listed, but only abnormal results are displayed) Labs Reviewed - No data to display  EKG None  Radiology No results found.  Procedures Procedures    Medications Ordered in ED Medications  Tdap (BOOSTRIX) injection 0.5 mL (has no administration in time range)    ED Course/ Medical Decision Making/ A&P                                 Medical Decision Making Patient with unwitnessed fall on plavix with right forearm pain   Amount and/or Complexity of Data Reviewed Independent Historian: spouse    Details: See above  External Data Reviewed: notes.    Details: Previous notes reviewed  Radiology: ordered and independent interpretation performed.    Details: No ICH on CT by me   Risk Prescription drug management.    Final Clinical Impression(s) / ED Diagnoses Final diagnoses:  Fall, initial encounter    I have reviewed the triage vital signs and the nursing  notes. Pertinent labs & imaging results that were available during my care of the patient were reviewed by me and considered in my medical decision making (see chart for details). After history, exam, and medical workup I feel the patient has been appropriately medically screened and is safe for discharge home. Pertinent diagnoses were discussed with the patient. Patient was given return precautions.  Rx / DC Orders ED Discharge Orders     None         Stiles Maxcy, MD 12/31/23 0981

## 2023-12-22 NOTE — ED Provider Notes (Addendum)
 Patient denied blood thinners.  Patient has right forearm pain but reports she fell on left side.   She is supposed to be on ASA is not taking it.  She is taking Plavix .  Patient can barely stay awake in room.  Asked if she may have taken something to make her sleep patient denied and nodded off again.    EDP then asked what medication the patient was on at home and patient stated methadone  3 times daily.  Then stated she is also taking Klonopin .    Patient wants more pain medication.  Given patient's level of alertness this is not appropriate. Will reassess based on imaging findings.    Fall is clearly caused by somnolence from polypharmacy.  Patient sees pain management so there will not be an RX.        Dorianne Perret, MD 12/22/23 [phone]

## 2023-12-22 NOTE — ED Notes (Signed)
 Patient transported to CT

## 2023-12-25 ENCOUNTER — Ambulatory Visit: Payer: Medicare Other | Attending: Nurse Practitioner | Admitting: Physical Therapy

## 2023-12-25 NOTE — Therapy (Incomplete)
OUTPATIENT PHYSICAL THERAPY CERVICAL EVALUATION   Patient Name: Becky Wolfe MRN: [id number] DOB:29-Oct-1961, 63 y.o., female Today's Date: 12/25/2023  END OF SESSION:   Past Medical History:  Diagnosis Date   Anxiety 06/16/2013   hx. panic attacks, none recent   Blood transfusion without reported diagnosis    20 yrs ago after childbirth   Bronchitis    hx of   Chronic neck pain    Constipation    Depression    DVT (deep venous thrombosis) (HCC) 2015   rt. leg    Headache(784.0)    migraines-has decreased   Heart murmur    Dr. Dione Housekeeper   Hepatitis C    dx. Hep. C(s/p transfusion age 60) low Hgb  .-multiple transfusions. Treated successfully   Multiple sclerosis (HCC)    dx. -46yrs ago, then med stopped-due to liver enzymes changes,occ. periods of weakness in arms"d.  was seen at [facility] and tolded that she does not have MSrops things"   Pneumonia    hx of , ? mild emphysema on CT scan 5'14, multiple pulmonary nodules   RSD (reflex sympathetic dystrophy) 06/16/2013   legs   RSD (reflex sympathetic dystrophy) 2009   TIA (transient ischemic attack)    hx of (weakness left side)-none in 1 yr   Past Surgical History:  Procedure Laterality Date   ABDOMINAL HYSTERECTOMY     ANAL RECTAL MANOMETRY N/A 04/11/2015   Procedure: ANO RECTAL MANOMETRY;  Surgeon: Willis Modena, MD;  Location: [facility];  Service: Endoscopy;  Laterality: N/A;   ANAL RECTAL MANOMETRY N/A 05/21/2017   Procedure: ANO RECTAL MANOMETRY;  Surgeon: Willis Modena, MD;  Location: [facility];  Service: Endoscopy;  Laterality: N/A;   ANKLE SURGERY Right    no retained hardware   ANTERIOR CERVICAL DECOMP/DISCECTOMY FUSION  09/21/2012   Procedure: ANTERIOR CERVICAL DECOMPRESSION/DISCECTOMY FUSION 2 LEVELS;  Surgeon: Clydene Fake, MD;  Location: [facility];  Service: Neurosurgery;  Laterality: N/A;  Cervical five-six, cervical seven-thoracic one Anterior cervical decompression/diskectomy/fusion/Lifenet  bone/trestle plate/Prior Cervical six--seven Anterior cervical fusion/Removing trestle plate   ANTERIOR CERVICAL DECOMP/DISCECTOMY FUSION N/A 08/05/2018   Procedure: Cervical four-five Anterior cervical decompression/discectomy/fusion;  Surgeon: Jadene Pierini, MD;  Location: [facility];  Service: Neurosurgery;  Laterality: N/A;   APPENDECTOMY     CESAREAN SECTION     twice   COLONOSCOPY     ESOPHAGOGASTRODUODENOSCOPY (EGD) WITH PROPOFOL N/A 06/30/2013   Procedure: ESOPHAGOGASTRODUODENOSCOPY (EGD) WITH PROPOFOL;  Surgeon: Willis Modena, MD;  Location: [facility];  Service: Endoscopy;  Laterality: N/A;   NECK SURGERY     TRIGGER FINGER RELEASE Left 07/22/2023   Procedure: RELEASE TRIGGER FINGER/A-1 PULLEY;  Surgeon: Sheral Apley, MD;  Location: [facility];  Service: Orthopedics;  Laterality: Left;   WEDGE RESECTION     left ovary with cyst removed ,hx. multiple cysts removed prior   Patient Active Problem List   Diagnosis Date Noted   Trigger finger of left thumb 07/22/2023   Polypharmacy 04/14/2022   Pulmonary nodule 04/14/2022   Normocytic anemia 04/14/2022   Transaminitis 04/14/2022   Acute UTI (urinary tract infection) 04/14/2022   History of hepatitis C 04/14/2022   Acute respiratory failure with hypoxia (HCC) 04/14/2022   Middle ear effusion, left 04/14/2022   Acute encephalopathy 04/13/2022   Personal history of transient ischemic attack (TIA), and cerebral infarction without residual deficits 02/13/2022   ETD (Eustachian tube dysfunction), bilateral 01/14/2022   Syncope 08/31/2021   Family history of coronary  artery disease in mother 08/31/2021   TIA (transient ischemic attack) 07/11/2021   Chronic deep vein thrombosis (DVT) of left lower extremity (HCC) 04/24/2021   Opioid dependence (HCC) 04/24/2021   Other cirrhosis of liver (HCC) 04/24/2021   Essential hypertension 04/24/2021   Relapsing remitting multiple sclerosis (HCC) 04/24/2021   Slurred speech  03/15/2021   Protruded cervical disc 06/22/2020   Complex regional pain syndrome type 1 of lower extremity 06/22/2020   Numbness 12/30/2018   Cervical myelopathy (HCC) 08/05/2018   Neck pain 02/19/2018   History of fusion of cervical spine 02/19/2018   Urinary hesitancy 02/19/2018   Chronic non-specific white matter lesions on MRI 02/19/2018   Accidental overdose 03/07/2016   Atherosclerosis of native coronary artery of native heart without angina pectoris 02/10/2015   History of DVT (deep vein thrombosis) 02/10/2015   Tobacco abuse 02/10/2015   Right leg pain 04/23/2013   DVT (deep vein thrombosis) in pregnancy 04/23/2013   Chronic pain disorder 04/23/2013   RSD lower limb 04/23/2013   Chronic anticoagulation 04/23/2013   CNS demyelinating disorder (HCC) 10/19/2012    PCP: K. Clelia Croft, MD  REFERRING PROVIDER: Kirtland Bouchard. Clelia Croft, MD  REFERRING DIAG: cervicalgia, CRPS  THERAPY DIAG:  No diagnosis found.  Rationale for Evaluation and Treatment: Rehabilitation  ONSET DATE: 11/26/23  SUBJECTIVE:                                                                                                                                                                                                         SUBJECTIVE STATEMENT: *** Hand dominance: {MISC; OT HAND DOMINANCE:[phone]}  PERTINENT HISTORY:  Anxiety, depression, HA, Hep C, MS, CRPS, TIA, ACDF  PAIN:  Are you having pain? Yes: NPRS scale: *** Pain location: *** Pain description: *** Aggravating factors: *** Relieving factors: ***  PRECAUTIONS: {Therapy precautions:24002}  RED FLAGS: {PT Red Flags:29287}     WEIGHT BEARING RESTRICTIONS: {Yes ***/No:24003}  FALLS:  Has patient fallen in last 6 months? {fallsyesno:27318}  LIVING ENVIRONMENT: Lives with: {OPRC lives with:25569::"lives with their family"} Lives in: {Lives in:25570} Stairs: {opstairs:27293} Has following equipment at home: {Assistive devices:23999}  OCCUPATION:  ***  PLOF: {PLOF:24004}  PATIENT GOALS: ***  NEXT MD VISIT: ***  OBJECTIVE:  Note: Objective measures were completed at Evaluation unless otherwise noted.  DIAGNOSTIC FINDINGS:  IMPRESSION: 1. No acute traumatic injury identified in the cervical spine. 2. Widespread previous upper spinal fusion. Solid arthrodesis from C4 through C7. But chronically fractured T1 cortical screws, and appearance suspicious for chronic C7-T1 pseudoarthrosis. 3. Stable chronic lung disease in the  upper chest.  PATIENT SURVEYS:  {rehab surveys:24030}  COGNITION: Overall cognitive status: {cognition:24006}  SENSATION: {sensation:27233}  POSTURE: {posture:25561}  PALPATION: ***   CERVICAL ROM:   Active ROM A/PROM (deg) eval  Flexion   Extension   Right lateral flexion   Left lateral flexion   Right rotation   Left rotation    (Blank rows = not tested)  UPPER EXTREMITY ROM:  Active ROM Right eval Left eval  Shoulder flexion    Shoulder extension    Shoulder abduction    Shoulder adduction    Shoulder extension    Shoulder internal rotation    Shoulder external rotation    Elbow flexion    Elbow extension    Wrist flexion    Wrist extension    Wrist ulnar deviation    Wrist radial deviation    Wrist pronation    Wrist supination     (Blank rows = not tested)  UPPER EXTREMITY MMT:  MMT Right eval Left eval  Shoulder flexion    Shoulder extension    Shoulder abduction    Shoulder adduction    Shoulder extension    Shoulder internal rotation    Shoulder external rotation    Middle trapezius    Lower trapezius    Elbow flexion    Elbow extension    Wrist flexion    Wrist extension    Wrist ulnar deviation    Wrist radial deviation    Wrist pronation    Wrist supination    Grip strength     (Blank rows = not tested)  CERVICAL SPECIAL TESTS:  {Cervical special tests:25246}  FUNCTIONAL TESTS:  {Functional tests:24029}  TREATMENT DATE: ***                                                                                                                                  PATIENT EDUCATION:  Education details: *** Person educated: {Person educated:25204} Education method: {Education Method:25205} Education comprehension: {Education Comprehension:25206}  HOME EXERCISE PROGRAM: ***  ASSESSMENT:  CLINICAL IMPRESSION: Patient is a 63 y.o. female who was seen today for physical therapy evaluation and treatment for ***.   OBJECTIVE IMPAIRMENTS: cardiopulmonary status limiting activity, decreased activity tolerance, decreased endurance, decreased mobility, difficulty walking, decreased ROM, decreased strength, increased muscle spasms, impaired flexibility, impaired UE functional use, improper body mechanics, postural dysfunction, and pain.   REHAB POTENTIAL: Good  CLINICAL DECISION MAKING: Evolving/moderate complexity  EVALUATION COMPLEXITY: Moderate   GOALS: Goals reviewed with patient? Yes  SHORT TERM GOALS: Target date: 01/09/24  Independent with initial HEP Baseline:  Goal status: INITIAL   LONG TERM GOALS: Target date: 03/23/24  Understand posture and body mechanics Baseline:  Goal status: INITIAL  2.  Independent with advanced HEP Baseline:  Goal status: INITIAL  3.  *** Baseline:  Goal status: INITIAL  4.  *** Baseline:  Goal status: INITIAL  5.  *** Baseline:  Goal status:  INITIAL  6.  *** Baseline:  Goal status: INITIAL   PLAN:  PT FREQUENCY: 1-2x/week  PT DURATION: 12 weeks  PLANNED INTERVENTIONS: 97164- PT Re-evaluation, 97110-Therapeutic exercises, 97530- Therapeutic activity, [id number]- Neuromuscular re-education, 97535- Self Care, 16109- Manual therapy, [id number]- Gait training, 97014- Electrical stimulation (unattended), Patient/Family education, Taping, Dry Needling, Joint mobilization, Cryotherapy, and Moist heat  PLAN FOR NEXT SESSION: Jearld Lesch, PT 12/25/2023, 7:47 AM

## 2024-01-08 ENCOUNTER — Other Ambulatory Visit: Payer: Self-pay | Admitting: Physician Assistant

## 2024-01-09 NOTE — Telephone Encounter (Signed)
 LF 2/4, due 3/4

## 2024-01-14 ENCOUNTER — Telehealth: Payer: Self-pay | Admitting: Physician Assistant

## 2024-01-14 NOTE — Telephone Encounter (Signed)
 Becky Wolfe is not our patient. Marsi's RF was sent this morning.

## 2024-01-14 NOTE — Telephone Encounter (Signed)
 Next visit is 01/28/24. Requesting refill on Becky Wolfe's Clonazepam. He it totally out. Pharmacy is:  [id number] [place] Otto, [place] - [address] AT [facility] [address]   Phone: [phone]  Fax: [phone]

## 2024-01-28 ENCOUNTER — Ambulatory Visit: Payer: Medicare Other | Admitting: Physician Assistant

## 2024-01-29 ENCOUNTER — Ambulatory Visit: Attending: Nurse Practitioner | Admitting: Physical Therapy

## 2024-02-03 DIAGNOSIS — Z79899 Other long term (current) drug therapy: Secondary | ICD-10-CM | POA: Diagnosis not present

## 2024-02-03 DIAGNOSIS — M5412 Radiculopathy, cervical region: Secondary | ICD-10-CM | POA: Diagnosis not present

## 2024-02-03 DIAGNOSIS — G90522 Complex regional pain syndrome I of left lower limb: Secondary | ICD-10-CM | POA: Diagnosis not present

## 2024-02-03 DIAGNOSIS — M961 Postlaminectomy syndrome, not elsewhere classified: Secondary | ICD-10-CM | POA: Diagnosis not present

## 2024-02-05 ENCOUNTER — Encounter: Payer: Self-pay | Admitting: Physician Assistant

## 2024-02-05 ENCOUNTER — Ambulatory Visit (INDEPENDENT_AMBULATORY_CARE_PROVIDER_SITE_OTHER): Admitting: Physician Assistant

## 2024-02-05 DIAGNOSIS — F411 Generalized anxiety disorder: Secondary | ICD-10-CM

## 2024-02-05 DIAGNOSIS — F5105 Insomnia due to other mental disorder: Secondary | ICD-10-CM

## 2024-02-05 DIAGNOSIS — R443 Hallucinations, unspecified: Secondary | ICD-10-CM

## 2024-02-05 DIAGNOSIS — F22 Delusional disorders: Secondary | ICD-10-CM | POA: Diagnosis not present

## 2024-02-05 DIAGNOSIS — F99 Mental disorder, not otherwise specified: Secondary | ICD-10-CM | POA: Diagnosis not present
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
## 2024-02-08 ENCOUNTER — Encounter: Payer: Self-pay | Admitting: Physician Assistant

## 2024-02-08 MED ORDER — QUETIAPINE FUMARATE ER 200 MG PO TB24: 200.0000 mg | ORAL_TABLET | Freq: Every day | ORAL | Status: DC

## 2024-02-08 MED ORDER — QUETIAPINE FUMARATE ER 400 MG PO TB24: 400.0000 mg | ORAL_TABLET | Freq: Every day | ORAL | Status: DC

## 2024-02-12 ENCOUNTER — Inpatient Hospital Stay (HOSPITAL_COMMUNITY)
Admission: EM | Admit: 2024-02-12 | Discharge: 2024-02-16 | DRG: 689 | Disposition: A | Discharge status: Home Health | Attending: Internal Medicine | Admitting: Internal Medicine

## 2024-02-12 ENCOUNTER — Other Ambulatory Visit: Payer: Self-pay

## 2024-02-12 ENCOUNTER — Encounter (HOSPITAL_COMMUNITY): Payer: Self-pay | Admitting: Emergency Medicine

## 2024-02-12 DIAGNOSIS — R4182 Altered mental status, unspecified: Principal | ICD-10-CM | POA: Diagnosis present

## 2024-02-12 DIAGNOSIS — G928 Other toxic encephalopathy: Secondary | ICD-10-CM | POA: Diagnosis not present

## 2024-02-12 DIAGNOSIS — Z7902 Long term (current) use of antithrombotics/antiplatelets: Secondary | ICD-10-CM | POA: Diagnosis not present

## 2024-02-12 DIAGNOSIS — R296 Repeated falls: Secondary | ICD-10-CM | POA: Diagnosis present

## 2024-02-12 DIAGNOSIS — Z800 Family history of malignant neoplasm of digestive organs: Secondary | ICD-10-CM

## 2024-02-12 DIAGNOSIS — Z8269 Family history of other diseases of the musculoskeletal system and connective tissue: Secondary | ICD-10-CM | POA: Diagnosis not present

## 2024-02-12 DIAGNOSIS — Z86718 Personal history of other venous thrombosis and embolism: Secondary | ICD-10-CM | POA: Diagnosis not present

## 2024-02-12 DIAGNOSIS — G90523 Complex regional pain syndrome I of lower limb, bilateral: Secondary | ICD-10-CM | POA: Diagnosis not present

## 2024-02-12 DIAGNOSIS — N390 Urinary tract infection, site not specified: Secondary | ICD-10-CM | POA: Diagnosis not present

## 2024-02-12 DIAGNOSIS — Z9189 Other specified personal risk factors, not elsewhere classified: Secondary | ICD-10-CM | POA: Diagnosis not present

## 2024-02-12 DIAGNOSIS — R9431 Abnormal electrocardiogram [ECG] [EKG]: Secondary | ICD-10-CM | POA: Diagnosis not present

## 2024-02-12 DIAGNOSIS — Z91410 Personal history of adult physical and sexual abuse: Secondary | ICD-10-CM

## 2024-02-12 DIAGNOSIS — R29818 Other symptoms and signs involving the nervous system: Secondary | ICD-10-CM | POA: Diagnosis not present

## 2024-02-12 DIAGNOSIS — Z7982 Long term (current) use of aspirin: Secondary | ICD-10-CM

## 2024-02-12 DIAGNOSIS — D638 Anemia in other chronic diseases classified elsewhere: Secondary | ICD-10-CM | POA: Diagnosis not present

## 2024-02-12 DIAGNOSIS — M1711 Unilateral primary osteoarthritis, right knee: Secondary | ICD-10-CM | POA: Diagnosis not present

## 2024-02-12 DIAGNOSIS — G43909 Migraine, unspecified, not intractable, without status migrainosus: Secondary | ICD-10-CM | POA: Diagnosis present

## 2024-02-12 DIAGNOSIS — Z79899 Other long term (current) drug therapy: Secondary | ICD-10-CM | POA: Diagnosis not present

## 2024-02-12 DIAGNOSIS — R8290 Unspecified abnormal findings in urine: Secondary | ICD-10-CM

## 2024-02-12 DIAGNOSIS — R4781 Slurred speech: Secondary | ICD-10-CM | POA: Diagnosis present

## 2024-02-12 DIAGNOSIS — F331 Major depressive disorder, recurrent, moderate: Secondary | ICD-10-CM | POA: Diagnosis present

## 2024-02-12 DIAGNOSIS — Z9181 History of falling: Secondary | ICD-10-CM

## 2024-02-12 DIAGNOSIS — R9082 White matter disease, unspecified: Secondary | ICD-10-CM | POA: Diagnosis not present

## 2024-02-12 DIAGNOSIS — M1990 Unspecified osteoarthritis, unspecified site: Secondary | ICD-10-CM | POA: Diagnosis not present

## 2024-02-12 DIAGNOSIS — J439 Emphysema, unspecified: Secondary | ICD-10-CM | POA: Diagnosis not present

## 2024-02-12 DIAGNOSIS — Z8249 Family history of ischemic heart disease and other diseases of the circulatory system: Secondary | ICD-10-CM | POA: Diagnosis not present

## 2024-02-12 DIAGNOSIS — F17210 Nicotine dependence, cigarettes, uncomplicated: Secondary | ICD-10-CM | POA: Diagnosis not present

## 2024-02-12 DIAGNOSIS — Z841 Family history of disorders of kidney and ureter: Secondary | ICD-10-CM

## 2024-02-12 DIAGNOSIS — F410 Panic disorder [episodic paroxysmal anxiety] without agoraphobia: Secondary | ICD-10-CM | POA: Diagnosis present

## 2024-02-12 DIAGNOSIS — M25561 Pain in right knee: Secondary | ICD-10-CM | POA: Diagnosis not present

## 2024-02-12 DIAGNOSIS — Z888 Allergy status to other drugs, medicaments and biological substances status: Secondary | ICD-10-CM

## 2024-02-12 DIAGNOSIS — F1320 Sedative, hypnotic or anxiolytic dependence, uncomplicated: Secondary | ICD-10-CM | POA: Diagnosis present

## 2024-02-12 DIAGNOSIS — Z818 Family history of other mental and behavioral disorders: Secondary | ICD-10-CM

## 2024-02-12 DIAGNOSIS — G35 Multiple sclerosis: Secondary | ICD-10-CM | POA: Diagnosis present

## 2024-02-12 DIAGNOSIS — Z833 Family history of diabetes mellitus: Secondary | ICD-10-CM | POA: Diagnosis not present

## 2024-02-12 DIAGNOSIS — Z87820 Personal history of traumatic brain injury: Secondary | ICD-10-CM

## 2024-02-12 DIAGNOSIS — Z90710 Acquired absence of both cervix and uterus: Secondary | ICD-10-CM

## 2024-02-12 DIAGNOSIS — F05 Delirium due to known physiological condition: Secondary | ICD-10-CM | POA: Diagnosis not present

## 2024-02-12 DIAGNOSIS — Z8673 Personal history of transient ischemic attack (TIA), and cerebral infarction without residual deficits: Secondary | ICD-10-CM

## 2024-02-12 NOTE — ED Triage Notes (Signed)
 Per family pt had a fall today and slurred speech. After speaking to the family, it is discovered that she has a nuro condition that causes her to be unsteady and slurred speech often.  After further discussion the speech has been this way for at least two days.

## 2024-02-12 NOTE — ED Provider Triage Note (Signed)
 Emergency Medicine Provider Triage Evaluation Note  Becky Wolfe , a 63 y.o. female  was evaluated in triage.  Pt complains of confusion, frequent falls, garbled speech.  Has been worsening over the past few days.  Review of Systems  Positive: Ams, slurred speech Negative:   Physical Exam  BP 131/86 (BP Location: Right Arm)   Pulse (!) 58   Temp 98.1 F (36.7 C) (Oral)   Resp 14   LMP 10/16/2011   SpO2 99%  Gen:   Awake, no distress   Resp:  Normal effort  MSK:   Moves extremities without difficulty  Other:    Medical Decision Making  Medically screening exam initiated at 11:51 PM.  Appropriate orders placed.  Ernesto Lashway Mctigue was informed that the remainder of the evaluation will be completed by another provider, this initial triage assessment does not replace that evaluation, and the importance of remaining in the ED until their evaluation is complete.     Roxy Horseman, PA-C 02/12/24 2352

## 2024-02-13 ENCOUNTER — Inpatient Hospital Stay (HOSPITAL_COMMUNITY)

## 2024-02-13 ENCOUNTER — Emergency Department (HOSPITAL_COMMUNITY)

## 2024-02-13 DIAGNOSIS — R8290 Unspecified abnormal findings in urine: Secondary | ICD-10-CM

## 2024-02-13 DIAGNOSIS — M1711 Unilateral primary osteoarthritis, right knee: Secondary | ICD-10-CM | POA: Diagnosis not present

## 2024-02-13 DIAGNOSIS — M25561 Pain in right knee: Secondary | ICD-10-CM | POA: Diagnosis not present

## 2024-02-13 DIAGNOSIS — F05 Delirium due to known physiological condition: Secondary | ICD-10-CM

## 2024-02-13 DIAGNOSIS — Z800 Family history of malignant neoplasm of digestive organs: Secondary | ICD-10-CM | POA: Diagnosis not present

## 2024-02-13 DIAGNOSIS — G43909 Migraine, unspecified, not intractable, without status migrainosus: Secondary | ICD-10-CM | POA: Diagnosis present

## 2024-02-13 DIAGNOSIS — M25552 Pain in left hip: Secondary | ICD-10-CM | POA: Diagnosis not present

## 2024-02-13 DIAGNOSIS — F1320 Sedative, hypnotic or anxiolytic dependence, uncomplicated: Secondary | ICD-10-CM | POA: Diagnosis present

## 2024-02-13 DIAGNOSIS — Z9189 Other specified personal risk factors, not elsewhere classified: Secondary | ICD-10-CM

## 2024-02-13 DIAGNOSIS — G35 Multiple sclerosis: Secondary | ICD-10-CM | POA: Diagnosis present

## 2024-02-13 DIAGNOSIS — Z79899 Other long term (current) drug therapy: Secondary | ICD-10-CM

## 2024-02-13 DIAGNOSIS — M1990 Unspecified osteoarthritis, unspecified site: Secondary | ICD-10-CM | POA: Diagnosis present

## 2024-02-13 DIAGNOSIS — R4781 Slurred speech: Secondary | ICD-10-CM | POA: Diagnosis present

## 2024-02-13 DIAGNOSIS — F17210 Nicotine dependence, cigarettes, uncomplicated: Secondary | ICD-10-CM | POA: Diagnosis present

## 2024-02-13 DIAGNOSIS — Z7982 Long term (current) use of aspirin: Secondary | ICD-10-CM | POA: Diagnosis not present

## 2024-02-13 DIAGNOSIS — D638 Anemia in other chronic diseases classified elsewhere: Secondary | ICD-10-CM | POA: Diagnosis present

## 2024-02-13 DIAGNOSIS — Z841 Family history of disorders of kidney and ureter: Secondary | ICD-10-CM | POA: Diagnosis not present

## 2024-02-13 DIAGNOSIS — N390 Urinary tract infection, site not specified: Secondary | ICD-10-CM

## 2024-02-13 DIAGNOSIS — R296 Repeated falls: Secondary | ICD-10-CM | POA: Diagnosis present

## 2024-02-13 DIAGNOSIS — F331 Major depressive disorder, recurrent, moderate: Secondary | ICD-10-CM | POA: Diagnosis present

## 2024-02-13 DIAGNOSIS — Z8249 Family history of ischemic heart disease and other diseases of the circulatory system: Secondary | ICD-10-CM | POA: Diagnosis not present

## 2024-02-13 DIAGNOSIS — Z7902 Long term (current) use of antithrombotics/antiplatelets: Secondary | ICD-10-CM | POA: Diagnosis not present

## 2024-02-13 DIAGNOSIS — Z86718 Personal history of other venous thrombosis and embolism: Secondary | ICD-10-CM | POA: Diagnosis not present

## 2024-02-13 DIAGNOSIS — R29818 Other symptoms and signs involving the nervous system: Secondary | ICD-10-CM | POA: Diagnosis not present

## 2024-02-13 DIAGNOSIS — G928 Other toxic encephalopathy: Secondary | ICD-10-CM | POA: Diagnosis present

## 2024-02-13 DIAGNOSIS — R4182 Altered mental status, unspecified: Secondary | ICD-10-CM | POA: Diagnosis not present

## 2024-02-13 DIAGNOSIS — F410 Panic disorder [episodic paroxysmal anxiety] without agoraphobia: Secondary | ICD-10-CM | POA: Diagnosis present

## 2024-02-13 DIAGNOSIS — R9082 White matter disease, unspecified: Secondary | ICD-10-CM | POA: Diagnosis not present

## 2024-02-13 DIAGNOSIS — Z8269 Family history of other diseases of the musculoskeletal system and connective tissue: Secondary | ICD-10-CM | POA: Diagnosis not present

## 2024-02-13 DIAGNOSIS — Z818 Family history of other mental and behavioral disorders: Secondary | ICD-10-CM | POA: Diagnosis not present

## 2024-02-13 DIAGNOSIS — Z833 Family history of diabetes mellitus: Secondary | ICD-10-CM | POA: Diagnosis not present

## 2024-02-13 DIAGNOSIS — J439 Emphysema, unspecified: Secondary | ICD-10-CM | POA: Diagnosis present

## 2024-02-13 DIAGNOSIS — G90523 Complex regional pain syndrome I of lower limb, bilateral: Secondary | ICD-10-CM | POA: Diagnosis present

## 2024-02-13 LAB — COMPREHENSIVE METABOLIC PANEL WITH GFR
ALT: 33 U/L (ref 0–44)
AST: 43 U/L — ABNORMAL HIGH (ref 15–41)
Albumin: 3.7 g/dL (ref 3.5–5.0)
Alkaline Phosphatase: 88 U/L (ref 38–126)
Anion gap: 8 (ref 5–15)
BUN: 8 mg/dL (ref 8–23)
CO2: 25 mmol/L (ref 22–32)
Calcium: 8.9 mg/dL (ref 8.9–10.3)
Chloride: 107 mmol/L (ref 98–111)
Creatinine, Ser: 0.99 mg/dL (ref 0.44–1.00)
GFR, Estimated: >60 mL/min (ref >60)
Glucose, Bld: 121 mg/dL — ABNORMAL HIGH (ref 70–99)
Potassium: 3.6 mmol/L (ref 3.5–5.1)
Sodium: 140 mmol/L (ref 135–145)
Total Bilirubin: 0.5 mg/dL (ref 0.0–1.2)
Total Protein: 7.2 g/dL (ref 6.5–8.1)

## 2024-02-13 LAB — HIV ANTIBODY (ROUTINE TESTING W REFLEX): HIV Screen 4th Generation wRfx: NONREACTIVE

## 2024-02-13 LAB — RAPID URINE DRUG SCREEN, HOSP PERFORMED
Amphetamines: NOT DETECTED
Barbiturates: NOT DETECTED
Benzodiazepines: POSITIVE — AB
Cocaine: NOT DETECTED
Opiates: NOT DETECTED
Tetrahydrocannabinol: NOT DETECTED

## 2024-02-13 LAB — I-STAT CHEM 8, ED
BUN: 8 mg/dL (ref 8–23)
Calcium, Ion: 1.14 mmol/L — ABNORMAL LOW (ref 1.15–1.40)
Chloride: 106 mmol/L (ref 98–111)
Creatinine, Ser: 1 mg/dL (ref 0.44–1.00)
Glucose, Bld: 113 mg/dL — ABNORMAL HIGH (ref 70–99)
HCT: 34 % — ABNORMAL LOW (ref 36.0–46.0)
Hemoglobin: 11.6 g/dL — ABNORMAL LOW (ref 12.0–15.0)
Potassium: 3.6 mmol/L (ref 3.5–5.1)
Sodium: 143 mmol/L (ref 135–145)
TCO2: 25 mmol/L (ref 22–32)

## 2024-02-13 LAB — URINALYSIS, ROUTINE W REFLEX MICROSCOPIC
Bilirubin Urine: NEGATIVE
Glucose, UA: NEGATIVE mg/dL
Hgb urine dipstick: NEGATIVE
Ketones, ur: NEGATIVE mg/dL
Nitrite: NEGATIVE
Protein, ur: NEGATIVE mg/dL
Specific Gravity, Urine: >1.03 — ABNORMAL HIGH (ref 1.005–1.030)
pH: 6 (ref 5.0–8.0)

## 2024-02-13 LAB — CBC
HCT: 33.6 % — ABNORMAL LOW (ref 36.0–46.0)
HCT: 34.5 % — ABNORMAL LOW (ref 36.0–46.0)
Hemoglobin: 11.5 g/dL — ABNORMAL LOW (ref 12.0–15.0)
Hemoglobin: 11.5 g/dL — ABNORMAL LOW (ref 12.0–15.0)
MCH: 29.1 pg (ref 26.0–34.0)
MCH: 29.3 pg (ref 26.0–34.0)
MCHC: 33.3 g/dL (ref 30.0–36.0)
MCHC: 34.2 g/dL (ref 30.0–36.0)
MCV: 85.1 fL (ref 80.0–100.0)
MCV: 88 fL (ref 80.0–100.0)
Platelets: 183 10*3/uL (ref 150–400)
Platelets: 207 10*3/uL (ref 150–400)
RBC: 3.92 MIL/uL (ref 3.87–5.11)
RBC: 3.95 MIL/uL (ref 3.87–5.11)
RDW: 12.9 % (ref 11.5–15.5)
RDW: 13.1 % (ref 11.5–15.5)
WBC: 3.3 10*3/uL — ABNORMAL LOW (ref 4.0–10.5)
WBC: 4.6 10*3/uL (ref 4.0–10.5)
nRBC: 0 % (ref 0.0–0.2)
nRBC: 0 % (ref 0.0–0.2)

## 2024-02-13 LAB — DIFFERENTIAL
Abs Immature Granulocytes: 0.01 10*3/uL (ref 0.00–0.07)
Basophils Absolute: 0 10*3/uL (ref 0.0–0.1)
Basophils Relative: 0 %
Eosinophils Absolute: 0.1 10*3/uL (ref 0.0–0.5)
Eosinophils Relative: 3 %
Immature Granulocytes: 0 %
Lymphocytes Relative: 38 %
Lymphs Abs: 1.8 10*3/uL (ref 0.7–4.0)
Monocytes Absolute: 0.4 10*3/uL (ref 0.1–1.0)
Monocytes Relative: 8 %
Neutro Abs: 2.3 10*3/uL (ref 1.7–7.7)
Neutrophils Relative %: 51 %

## 2024-02-13 LAB — URINALYSIS, MICROSCOPIC (REFLEX)

## 2024-02-13 LAB — PROTIME-INR
INR: 1.2 (ref 0.8–1.2)
Prothrombin Time: 14.9 s (ref 11.4–15.2)

## 2024-02-13 LAB — CREATININE, SERUM
Creatinine, Ser: 0.77 mg/dL (ref 0.44–1.00)
GFR, Estimated: >60 mL/min (ref >60)

## 2024-02-13 LAB — ETHANOL: Alcohol, Ethyl (B): <10 mg/dL (ref <10)

## 2024-02-13 LAB — APTT: aPTT: 30 s (ref 24–36)

## 2024-02-13 MED ORDER — CLONAZEPAM 0.5 MG PO TABS: 0.5000 mg | ORAL_TABLET | Freq: Four times a day (QID) | ORAL | Status: DC | PRN

## 2024-02-13 MED ORDER — QUETIAPINE FUMARATE ER 300 MG PO TB24
600.0000 mg | ORAL_TABLET | Freq: Every day | ORAL | Status: DC
  Administered 2024-02-13 – 2024-02-15 (×3): 600 mg via ORAL
  Filled 2024-02-13 (×4): qty 2

## 2024-02-13 MED ORDER — SERTRALINE HCL 100 MG PO TABS
200.0000 mg | ORAL_TABLET | Freq: Every day | ORAL | Status: DC
  Administered 2024-02-13 – 2024-02-16 (×4): 200 mg via ORAL
  Filled 2024-02-13 (×4): qty 2

## 2024-02-13 MED ORDER — IPRATROPIUM-ALBUTEROL 0.5-2.5 (3) MG/3ML IN SOLN: 3.0000 mL | RESPIRATORY_TRACT | Status: DC | PRN

## 2024-02-13 MED ORDER — HYDROXYZINE HCL 10 MG PO TABS
10.0000 mg | ORAL_TABLET | Freq: Two times a day (BID) | ORAL | Status: DC | PRN
Start: 2024-02-13 — End: 2024-02-14

## 2024-02-13 MED ORDER — TRAMADOL HCL 50 MG PO TABS
50.0000 mg | ORAL_TABLET | Freq: Four times a day (QID) | ORAL | Status: DC | PRN
  Administered 2024-02-13 – 2024-02-15 (×4): 50 mg via ORAL
  Filled 2024-02-13 (×4): qty 1

## 2024-02-13 MED ORDER — SODIUM CHLORIDE 0.9 % IV SOLN
1.0000 g | INTRAVENOUS | Status: DC
  Administered 2024-02-13 – 2024-02-16 (×4): 1 g via INTRAVENOUS
  Filled 2024-02-13 (×4): qty 10

## 2024-02-13 MED ORDER — GADOBUTROL 1 MMOL/ML IV SOLN
7.0000 mL | Freq: Once | INTRAVENOUS | Status: AC | PRN
  Administered 2024-02-13: 7 mL via INTRAVENOUS

## 2024-02-13 MED ORDER — METHADONE HCL 5 MG PO TABS
5.0000 mg | ORAL_TABLET | Freq: Two times a day (BID) | ORAL | Status: DC
  Administered 2024-02-13 – 2024-02-16 (×6): 5 mg via ORAL
  Filled 2024-02-13 (×8): qty 1

## 2024-02-13 MED ORDER — SODIUM CHLORIDE 0.9 % IV SOLN
1.0000 g | Freq: Once | INTRAVENOUS | Status: AC
  Administered 2024-02-13: 1 g via INTRAVENOUS
  Filled 2024-02-13: qty 10

## 2024-02-13 MED ORDER — LORAZEPAM 1 MG PO TABS: 1.0000 mg | ORAL_TABLET | ORAL | Status: DC | PRN

## 2024-02-13 MED ORDER — GABAPENTIN 400 MG PO CAPS
400.0000 mg | ORAL_CAPSULE | Freq: Three times a day (TID) | ORAL | Status: DC
  Administered 2024-02-13 – 2024-02-16 (×9): 400 mg via ORAL
  Filled 2024-02-13 (×9): qty 1

## 2024-02-13 MED ORDER — CLONIDINE HCL 0.1 MG PO TABS
0.1000 mg | ORAL_TABLET | Freq: Two times a day (BID) | ORAL | Status: DC
  Administered 2024-02-13 – 2024-02-16 (×7): 0.1 mg via ORAL
  Filled 2024-02-13 (×7): qty 1

## 2024-02-13 MED ORDER — LORAZEPAM 2 MG/ML IJ SOLN: 1.0000 mg | INTRAMUSCULAR | Status: DC | PRN

## 2024-02-13 MED ORDER — NICOTINE 14 MG/24HR TD PT24
14.0000 mg | MEDICATED_PATCH | Freq: Every day | TRANSDERMAL | Status: DC
  Administered 2024-02-14 – 2024-02-16 (×3): 14 mg via TRANSDERMAL
  Filled 2024-02-13 (×3): qty 1

## 2024-02-13 MED ORDER — CLONAZEPAM 0.5 MG PO TABS: 0.5000 mg | ORAL_TABLET | Freq: Three times a day (TID) | ORAL | Status: DC | PRN

## 2024-02-13 MED ORDER — ENOXAPARIN SODIUM 40 MG/0.4ML IJ SOSY
40.0000 mg | PREFILLED_SYRINGE | INTRAMUSCULAR | Status: DC
  Administered 2024-02-13 – 2024-02-16 (×4): 40 mg via SUBCUTANEOUS
  Filled 2024-02-13 (×4): qty 0.4

## 2024-02-13 NOTE — Progress Notes (Signed)
 Per patient's husband the patient takes the following: Sertraline 100mg  BID Gabapentin 800mg  TID Clopidogrel 75mg  Daily Methadone 5mg  TID Quetiapine ER 600mg  at night Clonidine 0.1mg  TID Clonazepam 0.5mg  4x a day

## 2024-02-13 NOTE — ED Provider Notes (Signed)
 [facility] Emergency Department Provider Note MRN:  [id number]  Arrival date & time: 02/13/24     Chief Complaint   Fall   History of Present Illness   Becky Wolfe is a 63 y.o. year-old female presents to the ED with chief complaint of altered mental status and confusion.  Family reports that she hasn't been acting herself for the past week or so.  Reports increased falls and states that almost anytime she stands up she falls.  Patient states that she has some pain in her left knee.  She denies fever, chills, cough, abdominal pain, n/v/d.  History provided by patient.   Review of Systems  Pertinent positive and negative review of systems noted in HPI.    Physical Exam   Vitals:   02/13/24 0542 02/13/24 0543  BP: 124/73   Pulse: (!) 47 (!) 48  Resp:  10  Temp:    SpO2: 100% 100%    CONSTITUTIONAL:  non toxic-appearing, NAD NEURO:  droswy, but arousable and oriented x 3, CN 3-12 grossly intact EYES:  eyes equal and reactive ENT/NECK:  Supple, no stridor  CARDIO:  normal rate, regular rhythm, appears well-perfused  PULM:  No respiratory distress, CTAB GI/GU:  non-distended, no tenderness MSK/SPINE:  No gross deformities, no edema, moves all extremities  SKIN:  no rash, atraumatic   *Additional and/or pertinent findings included in MDM below  Diagnostic and Interventional Summary    EKG Interpretation Date/Time:  Friday February 13 2024 03:33:38 EDT Ventricular Rate:  61 PR Interval:  132 QRS Duration:  94 QT Interval:  435 QTC Calculation: 439 R Axis:   46  Text Interpretation: Sinus rhythm Low voltage, precordial leads Borderline repolarization abnormality Confirmed by Ross Marcus ([id number]) on 02/13/2024 5:24:25 AM       Labs Reviewed  CBC - Abnormal; Notable for the following components:      Result Value   Hemoglobin 11.5 (*)    HCT 33.6 (*)    All other components within normal limits  COMPREHENSIVE METABOLIC PANEL WITH GFR  - Abnormal; Notable for the following components:   Glucose, Bld 121 (*)    AST 43 (*)    All other components within normal limits  RAPID URINE DRUG SCREEN, HOSP PERFORMED - Abnormal; Notable for the following components:   Benzodiazepines POSITIVE (*)    All other components within normal limits  URINALYSIS, ROUTINE W REFLEX MICROSCOPIC - Abnormal; Notable for the following components:   Specific Gravity, Urine >1.030 (*)    Leukocytes,Ua SMALL (*)    All other components within normal limits  URINALYSIS, MICROSCOPIC (REFLEX) - Abnormal; Notable for the following components:   Bacteria, UA RARE (*)    Non Squamous Epithelial PRESENT (*)    All other components within normal limits  I-STAT CHEM 8, ED - Abnormal; Notable for the following components:   Glucose, Bld 113 (*)    Calcium, Ion 1.14 (*)    Hemoglobin 11.6 (*)    HCT 34.0 (*)    All other components within normal limits  ETHANOL  PROTIME-INR  APTT  DIFFERENTIAL    MR BRAIN WO CONTRAST  Final Result    CT HEAD WO CONTRAST  Final Result    DG Knee Complete 4 Views Right  Final Result      Medications  cefTRIAXone (ROCEPHIN) 1 g in sodium chloride 0.9 % 100 mL IVPB (has no administration in time range)     Procedures  /  Critical Care Procedures  ED Course and Medical Decision Making  I have reviewed the triage vital signs, the nursing notes, and pertinent available records from the EMR.  Social Determinants Affecting Complexity of Care: Patient has no clinically significant social determinants affecting this chief complaint..   ED Course: Clinical Course as of 02/13/24 0602  Fri Feb 13, 2024  0536 CBC(!) No leukocytosis, mild anemia [RB]  0536 Comprehensive metabolic panel(!) No significant electrolyte abnormality [RB]  0536 Ethanol Not intoxicated with ethanol [RB]  0536 Urinalysis, Routine w reflex microscopic -Urine, Clean Catch(!) UA abnormal, will cover with rocephin.  This could contribute to  her confusion. [RB]  [id number] Urine rapid drug screen (hosp performed)(!) Positive for benzos.  Polypharmacy is also considered. [RB]    Clinical Course User Index [RB] Roxy Horseman, PA-C    Medical Decision Making Patient BIB family with concerns for frequent falls, slurred speech and confusion.  Symptoms have been worse over the past week.  She is able to answer questions, but does slur her speech and seems very drowsy.    Labs and imaging are discussed in ED course.  Symptoms could be from infection/UTI but could also be polypharmacy.  Recent behavioral health note suggests weaning some of her psych meds.    However, given that patient isn't at her normal baseline, I do think that the patient should be admitted for treatment and further evaluation.    Amount and/or Complexity of Data Reviewed Labs: ordered. Decision-making details documented in ED Course. Radiology: ordered.  Risk Decision regarding hospitalization.         Consultants: I consulted with Hospitalist, Dr. Margo Aye, who is appreciated for admitting.   Treatment and Plan: Patient's exam and diagnostic results are concerning for AMS.  Feel that patient will need admission to the hospital for further treatment and evaluation.  Patient discussed with attending physician, Dr. Wilkie Aye, who agrees with plan.  Final Clinical Impressions(s) / ED Diagnoses     ICD-10-CM   1. Altered mental status, unspecified altered mental status type  R41.82     2. Abnormal urinalysis  R82.90     3. Frequent falls  R29.6     4. At risk for polypharmacy  Z91.89       ED Discharge Orders     None         Discharge Instructions Discussed with and Provided to Patient:   Discharge Instructions   None      Roxy Horseman, PA-C 02/13/24 0602    Wilkie Aye Mayer Masker, MD 02/16/24 [phone]

## 2024-02-13 NOTE — H&P (Signed)
 History and Physical    Patient: Becky Wolfe ZOX:[id number] DOB: [birth year] DOA: 02/12/2024 DOS: the patient was seen and examined on 02/13/2024 PCP: Becky Raider, MD  Patient coming from: Home  Chief Complaint:  Chief Complaint  Patient presents with   Fall   HPI: Becky Wolfe is a 63 y.o. female with medical history significant of paranoia, hallucinations, GAD, insomnia, and chronic small vessel ischemia who is well known to Behavioral health service who presented to ED with increased confusion over the past 2 weeks. Pt has long hx of hallucinations and gait instability, followed by Neurology. In the ED, head CT was neg for  acute process. UA was suggestive of UTI. Pt was given dose of rocephin empirically.   At present, pt was very drowsy and majority of history was obtained from husband at bedside. Hospitalist consulted for consideration for admission   Review of Systems: unable to review all systems due to the inability of the patient to answer questions. Past Medical History:  Diagnosis Date   Anxiety 06/16/2013   hx. panic attacks, none recent   Blood transfusion without reported diagnosis    20 yrs ago after childbirth   Bronchitis    hx of   Chronic neck pain    Constipation    Depression    DVT (deep venous thrombosis) (HCC) 2015   rt. leg    Headache(784.0)    migraines-has decreased   Heart murmur    Dr. Dione Housekeeper   Hepatitis C    dx. Hep. C(s/p transfusion age 32) low Hgb  .-multiple transfusions. Treated successfully   Multiple sclerosis (HCC)    dx. -8yrs ago, then med stopped-due to liver enzymes changes,occ. periods of weakness in arms"d.  was seen at [facility] and tolded that she does not have MSrops things"   Pneumonia    hx of , ? mild emphysema on CT scan 5'14, multiple pulmonary nodules   RSD (reflex sympathetic dystrophy) 06/16/2013   legs   RSD (reflex sympathetic dystrophy) 2009   TIA (transient ischemic attack)    hx of (weakness left  side)-none in 1 yr   Past Surgical History:  Procedure Laterality Date   ABDOMINAL HYSTERECTOMY     ANAL RECTAL MANOMETRY N/A 04/11/2015   Procedure: ANO RECTAL MANOMETRY;  Surgeon: Willis Modena, MD;  Location: [facility];  Service: Endoscopy;  Laterality: N/A;   ANAL RECTAL MANOMETRY N/A 05/21/2017   Procedure: ANO RECTAL MANOMETRY;  Surgeon: Willis Modena, MD;  Location: [facility];  Service: Endoscopy;  Laterality: N/A;   ANKLE SURGERY Right    no retained hardware   ANTERIOR CERVICAL DECOMP/DISCECTOMY FUSION  09/21/2012   Procedure: ANTERIOR CERVICAL DECOMPRESSION/DISCECTOMY FUSION 2 LEVELS;  Surgeon: Clydene Fake, MD;  Location: [facility];  Service: Neurosurgery;  Laterality: N/A;  Cervical five-six, cervical seven-thoracic one Anterior cervical decompression/diskectomy/fusion/Lifenet bone/trestle plate/Prior Cervical six--seven Anterior cervical fusion/Removing trestle plate   ANTERIOR CERVICAL DECOMP/DISCECTOMY FUSION N/A 08/05/2018   Procedure: Cervical four-five Anterior cervical decompression/discectomy/fusion;  Surgeon: Jadene Pierini, MD;  Location: [facility];  Service: Neurosurgery;  Laterality: N/A;   APPENDECTOMY     CESAREAN SECTION     twice   COLONOSCOPY     ESOPHAGOGASTRODUODENOSCOPY (EGD) WITH PROPOFOL N/A 06/30/2013   Procedure: ESOPHAGOGASTRODUODENOSCOPY (EGD) WITH PROPOFOL;  Surgeon: Willis Modena, MD;  Location: [facility];  Service: Endoscopy;  Laterality: N/A;   NECK SURGERY     TRIGGER FINGER RELEASE Left 07/22/2023   Procedure: RELEASE TRIGGER FINGER/A-1 PULLEY;  Surgeon: Sheral Apley, MD;  Location: [facility];  Service: Orthopedics;  Laterality: Left;   WEDGE RESECTION     left ovary with cyst removed ,hx. multiple cysts removed prior   Social History:  reports that she has been smoking cigarettes. She has a 4.3 pack-year smoking history. She has never used smokeless tobacco. She reports that she does not drink alcohol and does  not use drugs.  Allergies  Allergen Reactions   Etodolac Swelling and Other (See Comments)    Facial swelling    Ambien [Zolpidem Tartrate] Other (See Comments)    Headaches, memory loss    Namenda [Memantine] Other (See Comments)    Headaches     Family History  Problem Relation Age of Onset   Heart attack Mother 1       with CABG   Heart disease Mother    Hypertension Mother    Migraines Mother    Heart attack Father    Heart disease Father    Diabetes Mellitus II Father    Hypertension Father    Lupus Sister    Depression Sister    Migraines Sister    Renal Disease Brother    Diabetes Sister    Hypertension Sister    Migraines Sister    Migraines Sister    Healthy Sister    Healthy Sister    Healthy Sister    Healthy Brother    Dementia Maternal Grandfather    Colon cancer Maternal Grandmother    Heart disease Maternal Grandmother    Hypertension Maternal Grandmother    Healthy Son    Migraines Son     Prior to Admission medications   Medication Sig Start Date End Date Taking? Authorizing Provider  acetaminophen (TYLENOL) 500 MG tablet Take 2 tablets (1,000 mg total) by mouth every 6 (six) hours as needed for mild pain or moderate pain. 07/22/23   Jenne Pane, PA-C  aspirin 81 MG chewable tablet Chew by mouth daily.    [provider]  buPROPion (WELLBUTRIN XL) 150 MG 24 hr tablet TAKE 1 TABLET(150 MG) BY MOUTH DAILY Patient not taking: Reported on 02/05/2024 10/02/23   Melony Overly T, PA-C  clonazePAM (KLONOPIN) 0.5 MG tablet TAKE 1 TABLET(0.5 MG) BY MOUTH EVERY 6 HOURS AS NEEDED FOR ANXIETY 01/14/24   Melony Overly T, PA-C  cloNIDine (CATAPRES) 0.1 MG tablet Take 0.1 mg by mouth 2 (two) times daily.    [provider]  clopidogrel (PLAVIX) 75 MG tablet Take 1 tablet (75 mg total) by mouth daily. 11/25/23   Sater, Pearletha Furl, MD  gabapentin (NEURONTIN) 800 MG tablet Take 800 mg by mouth 3 (three) times daily. 05/12/17   [provider]  hydrOXYzine (ATARAX) 10 MG tablet Take 1 tablet (10 mg total) by mouth 3 (three) times daily. 06/03/23   Sater, Pearletha Furl, MD  lidocaine (LIDODERM) 5 % Place 1 patch onto the skin daily. Remove & Discard patch within 12 hours or as directed by MD 12/22/23   Nicanor Alcon, April, MD  methadone (DOLOPHINE) 5 MG tablet Take 1 tablet (5 mg total) by mouth every 12 (twelve) hours. 04/15/22   Joseph Art, DO  naloxone [facility]) nasal spray 4 mg/0.1 mL Place 0.4 mg into the nose once as needed (opioid overdose). Patient not taking: Reported on 03/19/2023 04/02/22   [provider]  ondansetron (ZOFRAN-ODT) 4 MG disintegrating tablet Take 1 tablet (4 mg total) by mouth every 8 (eight) hours as  needed for nausea or vomiting. 07/22/23   Jenne Pane, PA-C  QUEtiapine (SEROQUEL XR) 200 MG 24 hr tablet Take 1 tablet (200 mg total) by mouth at bedtime. Take with the Seroquel XR 400 mg=600 mg 02/08/24   Melony Overly T, PA-C  QUEtiapine (SEROQUEL XR) 400 MG 24 hr tablet Take 1 tablet (400 mg total) by mouth at bedtime. Take with the 200 mg=600 mg qhs 02/08/24   Melony Overly T, PA-C  sertraline (ZOLOFT) 100 MG tablet TAKE 2 TABLETS(200 MG) BY MOUTH DAILY 09/10/23   Melony Overly T, PA-C  traMADol (ULTRAM) 50 MG tablet Take 1 tablet (50 mg total) by mouth every 12 (twelve) hours as needed for severe pain. 07/22/23 07/21/24  Jenne Pane, PA-C    Physical Exam: Vitals:   02/13/24 0543 02/13/24 0600 02/13/24 0630 02/13/24 0700  BP:  128/67 119/82 127/68  Pulse: (!) 48 (!) 50 (!) 48 (!) 46  Resp: 10 18 10 10   Temp:      TempSrc:      SpO2: 100% 100% 100% 100%  Weight:      Height:       General exam: asleep, laying in bed, in nad Respiratory system: Normal respiratory effort, no wheezing Cardiovascular system: regular rate, s1, s2 Gastrointestinal system: Soft, nondistended, positive BS Central nervous system: CN2-12 grossly intact, strength intact Extremities: Perfused, no clubbing Skin: Normal  skin turgor, no notable skin lesions seen Psychiatry: difficult to assess given mentation  Data Reviewed:  Labs reviewed: Na 143, K 3.6, Cr 1.00, WBC 4.6, hgb 11.5, Plts 207  Assessment and Plan: Toxic metabolic encephalopathy likely secondary to UTI -UA suggestive of UTI -Rocephin was started in ED, would continue empirically -Will check urine and blood cx -Follow CBC trends -Continue supportive care  Hx paranoia, anxiety -Chart reviewed. Per most recent behavioral health note on 3/27, recommendations noted to: Continue Klonopin 0.5 mg, 1 p.o. every 6 hours as needed anxiety.   Continue clonidine 0.1 mg, 1 p.o. twice daily by another provider. Continue gabapentin 800 mg, 1 p.o. 3 times daily prn. Continue hydroxyzine 10 mg, 1 p.o. twice daily as needed. Increase Seroquel XR to 600 mg nightly. Continue Zoloft 100 mg, 2 po qd  Chronic Normocytic Anemia -Hemodynamically stable  Tobacco abuse -long hx of smoking per pt's husband -Will provide nicotine patch   Advance Care Planning:   Code Status: Prior Full  Consults:   Family Communication: Pt in room  Severity of Illness: The appropriate patient status for this patient is INPATIENT. Inpatient status is judged to be reasonable and necessary in order to provide the required intensity of service to ensure the patient's safety. The patient's presenting symptoms, physical exam findings, and initial radiographic and laboratory data in the context of their chronic comorbidities is felt to place them at high risk for further clinical deterioration. Furthermore, it is not anticipated that the patient will be medically stable for discharge from the hospital within 2 midnights of admission.   * I certify that at the point of admission it is my clinical judgment that the patient will require inpatient hospital care spanning beyond 2 midnights from the point of admission due to high intensity of service, high risk for further deterioration  and high frequency of surveillance required.*  Author: Rickey Barbara, MD 02/13/2024 8:36 AM  For on call review [email or web address].

## 2024-02-13 NOTE — Plan of Care (Signed)
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
## 2024-02-13 NOTE — Consult Note (Addendum)
 NEUROLOGY CONSULT NOTE   Date of service: February 13, 2024 Patient Name: Becky Wolfe MRN:  [id number] DOB:  07/21/1961 Chief Complaint: "Altered mental status" Requesting Provider: Jerald Kief, MD  History of Present Illness  Becky Wolfe is a 63 y.o. female with PMHx significant for TIAs, migraine headaches starting in childhood, paranoia, hallucinations, progressive memory loss, RSD, and gait disturbance who presents to the ED due to frequent falls.  Patient started having migraine headaches in her teens.  Over the past 20 years, she has had personality changes with hallucination and paranoia along with gait instability which started happening more recently over the past 5 years.  Her sisters and brothers have migraine headaches.  Her husband thinks her grandmother had dementia.  No significant hearing loss or vision changes reported.  No obvious history of seizures.  Family does report history of "mini" strokes in the past.  No history of cancer.  Patient did smoke half a pack of cigarettes per day for many years.  Of note, patient also endorses left-sided flank pain that she has been dealing with for the past couple of weeks.  Denies any dysuria.   ROS  Comprehensive ROS performed and pertinent positives documented in HPI   Past History   Past Medical History:  Diagnosis Date   Anxiety 06/16/2013   hx. panic attacks, none recent   Blood transfusion without reported diagnosis    20 yrs ago after childbirth   Bronchitis    hx of   Chronic neck pain    Constipation    Depression    DVT (deep venous thrombosis) (HCC) 2015   rt. leg    Headache(784.0)    migraines-has decreased   Heart murmur    Dr. Dione Housekeeper   Hepatitis C    dx. Hep. C(s/p transfusion age 24) low Hgb  .-multiple transfusions. Treated successfully   Multiple sclerosis (HCC)    dx. -56yrs ago, then med stopped-due to liver enzymes changes,occ. periods of weakness in arms"d.  was seen at [facility] and tolded that  she does not have MSrops things"   Pneumonia    hx of , ? mild emphysema on CT scan 5'14, multiple pulmonary nodules   RSD (reflex sympathetic dystrophy) 06/16/2013   legs   RSD (reflex sympathetic dystrophy) 2009   TIA (transient ischemic attack)    hx of (weakness left side)-none in 1 yr    Past Surgical History:  Procedure Laterality Date   ABDOMINAL HYSTERECTOMY     ANAL RECTAL MANOMETRY N/A 04/11/2015   Procedure: ANO RECTAL MANOMETRY;  Surgeon: Willis Modena, MD;  Location: [facility];  Service: Endoscopy;  Laterality: N/A;   ANAL RECTAL MANOMETRY N/A 05/21/2017   Procedure: ANO RECTAL MANOMETRY;  Surgeon: Willis Modena, MD;  Location: [facility];  Service: Endoscopy;  Laterality: N/A;   ANKLE SURGERY Right    no retained hardware   ANTERIOR CERVICAL DECOMP/DISCECTOMY FUSION  09/21/2012   Procedure: ANTERIOR CERVICAL DECOMPRESSION/DISCECTOMY FUSION 2 LEVELS;  Surgeon: Clydene Fake, MD;  Location: [facility];  Service: Neurosurgery;  Laterality: N/A;  Cervical five-six, cervical seven-thoracic one Anterior cervical decompression/diskectomy/fusion/Lifenet bone/trestle plate/Prior Cervical six--seven Anterior cervical fusion/Removing trestle plate   ANTERIOR CERVICAL DECOMP/DISCECTOMY FUSION N/A 08/05/2018   Procedure: Cervical four-five Anterior cervical decompression/discectomy/fusion;  Surgeon: Jadene Pierini, MD;  Location: [facility];  Service: Neurosurgery;  Laterality: N/A;   APPENDECTOMY     CESAREAN SECTION     twice   COLONOSCOPY  ESOPHAGOGASTRODUODENOSCOPY (EGD) WITH PROPOFOL N/A 06/30/2013   Procedure: ESOPHAGOGASTRODUODENOSCOPY (EGD) WITH PROPOFOL;  Surgeon: Willis Modena, MD;  Location: [facility];  Service: Endoscopy;  Laterality: N/A;   NECK SURGERY     TRIGGER FINGER RELEASE Left 07/22/2023   Procedure: RELEASE TRIGGER FINGER/A-1 PULLEY;  Surgeon: Sheral Apley, MD;  Location: [facility];  Service: Orthopedics;  Laterality: Left;    WEDGE RESECTION     left ovary with cyst removed ,hx. multiple cysts removed prior    Family History: Family History  Problem Relation Age of Onset   Heart attack Mother 61       with CABG   Heart disease Mother    Hypertension Mother    Migraines Mother    Heart attack Father    Heart disease Father    Diabetes Mellitus II Father    Hypertension Father    Lupus Sister    Depression Sister    Migraines Sister    Renal Disease Brother    Diabetes Sister    Hypertension Sister    Migraines Sister    Migraines Sister    Healthy Sister    Healthy Sister    Healthy Sister    Healthy Brother    Dementia Maternal Grandfather    Colon cancer Maternal Grandmother    Heart disease Maternal Grandmother    Hypertension Maternal Grandmother    Healthy Son    Migraines Son     Social History  reports that she has been smoking cigarettes. She has a 4.3 pack-year smoking history. She has never used smokeless tobacco. She reports that she does not drink alcohol and does not use drugs.  Allergies  Allergen Reactions   Etodolac Swelling and Other (See Comments)    Facial swelling    Ambien [Zolpidem Tartrate] Other (See Comments)    Headaches, memory loss    Namenda [Memantine] Other (See Comments)    Headaches     Medications   Current Facility-Administered Medications:    cefTRIAXone (ROCEPHIN) 1 g in sodium chloride 0.9 % 100 mL IVPB, 1 g, Intravenous, Q24H, Jerald Kief, MD, Stopped at 02/13/24 1308   clonazePAM (KLONOPIN) tablet 0.5 mg, 0.5 mg, Oral, Q6H PRN, Jerald Kief, MD   cloNIDine (CATAPRES) tablet 0.1 mg, 0.1 mg, Oral, BID, Jerald Kief, MD, 0.1 mg at 02/13/24 1237   enoxaparin (LOVENOX) injection 40 mg, 40 mg, Subcutaneous, Q24H, Jerald Kief, MD, 40 mg at 02/13/24 1240   gabapentin (NEURONTIN) capsule 400 mg, 400 mg, Oral, TID, Jerald Kief, MD, 400 mg at 02/13/24 1629   hydrOXYzine (ATARAX) tablet 10 mg, 10 mg, Oral, BID PRN, Jerald Kief,  MD   ipratropium-albuterol (DUONEB) 0.5-2.5 (3) MG/3ML nebulizer solution 3 mL, 3 mL, Nebulization, Q4H PRN, Jerald Kief, MD   methadone (DOLOPHINE) tablet 5 mg, 5 mg, Oral, Q12H, Jerald Kief, MD   nicotine (NICODERM CQ - dosed in mg/24 hours) patch 14 mg, 14 mg, Transdermal, Daily, Jerald Kief, MD   sertraline (ZOLOFT) tablet 200 mg, 200 mg, Oral, Daily, Jerald Kief, MD, 200 mg at 02/13/24 1236   traMADol (ULTRAM) tablet 50 mg, 50 mg, Oral, Q6H PRN, Jerald Kief, MD, 50 mg at 02/13/24 1244  Vitals   Vitals:   02/13/24 0900 02/13/24 1034 02/13/24 1237 02/13/24 1452  BP: (!) 114/58  133/61 124/62  Pulse: (!) 43   (!) 52  Resp: (!) 9   12  Temp:  Marland Kitchen)  97.5 F (36.4 C)  98.1 F (36.7 C)  TempSrc:  Oral  Oral  SpO2: 99%   97%  Weight:    75 kg  Height:    5\' 2"  (1.575 m)    Body mass index is 30.24 kg/m.  Physical Exam    Neurologic Examination    General - Well nourished, well developed, in no apparent distress.  Ophthalmologic - fundi not visualized due to noncooperation.  Cardiovascular - Regular rhythm and rate.  Mental Status -  Moderately slowed speech, alert and oriented to person place year and month .  Cranial Nerves II - XII - II - Visual field intact OU. III, IV, VI - Extraocular movements intact. V - Facial sensation intact bilaterally. VII - Facial movement intact bilaterally. VIII - Hearing & vestibular intact bilaterally. X - Palate elevates symmetrically. XI - Chin turning & shoulder shrug intact bilaterally. XII - Tongue protrusion intact.  Motor Strength - 4/5 strength in all extremities, with increased tone in bilateral lower extremities.   Motor Tone - increased tone in bilateral lower extremities, chronic.  Reflexes - 3+ and symmetric throughout, negative Babinski  Sensory -diminished sensation to light touch in the right face arm and leg  Coordination -intact finger-to-nose bilaterally  Gait and Station -  deferred.   Labs/Imaging/Neurodiagnostic studies   CBC:  Recent Labs  Lab 2024/02/21 2356 February 21, 2024 2357 02/13/24 1205  WBC 4.6  --  3.3*  NEUTROABS 2.3  --   --   HGB 11.5* 11.6* 11.5*  HCT 33.6* 34.0* 34.5*  MCV 85.1  --  88.0  PLT 207  --  183   Basic Metabolic Panel:  Lab Results  Component Value Date   NA 143 2024/02/21   K 3.6 February 21, 2024   CO2 25 21-Feb-2024   GLUCOSE 113 (H) 2024/02/21   BUN 8 February 21, 2024   CREATININE 0.77 02/13/2024   CALCIUM 8.9 21-Feb-2024   GFRNONAA >60 02/13/2024   GFRAA >60 01/08/2020   Lipid Panel:  Lab Results  Component Value Date   LDLCALC 111 (H) 03/16/2021   HgbA1c:  Lab Results  Component Value Date   HGBA1C 5.8 (H) 03/16/2021   Urine Drug Screen:     Component Value Date/Time   LABOPIA NONE DETECTED 02/13/2024 0400   COCAINSCRNUR NONE DETECTED 02/13/2024 0400   LABBENZ POSITIVE (A) 02/13/2024 0400   AMPHETMU NONE DETECTED 02/13/2024 0400   THCU NONE DETECTED 02/13/2024 0400   LABBARB NONE DETECTED 02/13/2024 0400    Alcohol Level     Component Value Date/Time   ETH <10 02/21/24 2356   INR  Lab Results  Component Value Date   INR 1.2 02-21-24   APTT  Lab Results  Component Value Date   APTT 30 02/21/2024   AED levels: No results found for: "PHENYTOIN", "ZONISAMIDE", "LAMOTRIGINE", "LEVETIRACETA"   MRI Brain(Personally reviewed): Confluent, bilateral white matter disease, too advanced for age; old left periventricular lacunar stroke   ASSESSMENT   Becky Wolfe is a 63 y.o. female with PMHx significant for TIAs, migraine headaches starting in childhood, paranoia, hallucinations, progressive memory loss, RSD, and gait disturbance who presents to the ED due to frequent falls.  Patient confluent bilateral white matter disease, advanced for age.  End-stage microvascular ischemia versus CADASIL (fiven childhood history of migraine headaches, personality changes, and progressive dementia is concerning) vs versus  less likely SUSAC vs MELAS given lack of seizures history or lack of hearing, vision loss.  Out of abundance of caution, we  will send out labs for [facility] 3 testing for possible CADASIL.  Patient did have an LP as part of her white matter disease workup back in 2011, 2017 was unrevealing.  Will get MRI brain with contrast to further characterize white matter disease.   RECOMMENDATIONS  - Notch 3 (ordered, will takes weeks to result and should be followed up outpatient) - MRI brain with contrast (ordered) - No need for repeat LP at this time given negative CSF workup in 2011 and 2017. - Recommend outpatient Neurology follow up with Dr. Epimenio Foot - Recommend outpatient formal Neuropsychology evaluation for dementia at [facility].  ______________________________________________________________________    Signed, Anibal Henderson, MD [facility]

## 2024-02-14 ENCOUNTER — Other Ambulatory Visit (HOSPITAL_COMMUNITY): Payer: Self-pay

## 2024-02-14 DIAGNOSIS — Z9189 Other specified personal risk factors, not elsewhere classified: Secondary | ICD-10-CM | POA: Diagnosis not present

## 2024-02-14 DIAGNOSIS — Z79899 Other long term (current) drug therapy: Secondary | ICD-10-CM | POA: Diagnosis not present

## 2024-02-14 DIAGNOSIS — R8290 Unspecified abnormal findings in urine: Secondary | ICD-10-CM | POA: Diagnosis not present

## 2024-02-14 DIAGNOSIS — N390 Urinary tract infection, site not specified: Secondary | ICD-10-CM | POA: Diagnosis not present

## 2024-02-14 DIAGNOSIS — R4182 Altered mental status, unspecified: Secondary | ICD-10-CM | POA: Diagnosis not present

## 2024-02-14 DIAGNOSIS — F05 Delirium due to known physiological condition: Secondary | ICD-10-CM | POA: Diagnosis not present

## 2024-02-14 LAB — COMPREHENSIVE METABOLIC PANEL WITH GFR
ALT: 28 U/L (ref 0–44)
AST: 30 U/L (ref 15–41)
Albumin: 3.2 g/dL — ABNORMAL LOW (ref 3.5–5.0)
Alkaline Phosphatase: 75 U/L (ref 38–126)
Anion gap: 8 (ref 5–15)
BUN: 8 mg/dL (ref 8–23)
CO2: 26 mmol/L (ref 22–32)
Calcium: 8.8 mg/dL — ABNORMAL LOW (ref 8.9–10.3)
Chloride: 110 mmol/L (ref 98–111)
Creatinine, Ser: 0.93 mg/dL (ref 0.44–1.00)
GFR, Estimated: >60 mL/min (ref >60)
Glucose, Bld: 127 mg/dL — ABNORMAL HIGH (ref 70–99)
Potassium: 3.8 mmol/L (ref 3.5–5.1)
Sodium: 144 mmol/L (ref 135–145)
Total Bilirubin: 0.4 mg/dL (ref 0.0–1.2)
Total Protein: 6.6 g/dL (ref 6.5–8.1)

## 2024-02-14 LAB — CBC
HCT: 32.7 % — ABNORMAL LOW (ref 36.0–46.0)
Hemoglobin: 11.1 g/dL — ABNORMAL LOW (ref 12.0–15.0)
MCH: 29.1 pg (ref 26.0–34.0)
MCHC: 33.9 g/dL (ref 30.0–36.0)
MCV: 85.6 fL (ref 80.0–100.0)
Platelets: 192 10*3/uL (ref 150–400)
RBC: 3.82 MIL/uL — ABNORMAL LOW (ref 3.87–5.11)
RDW: 13.1 % (ref 11.5–15.5)
WBC: 3.5 10*3/uL — ABNORMAL LOW (ref 4.0–10.5)
nRBC: 0 % (ref 0.0–0.2)

## 2024-02-14 LAB — URINE CULTURE

## 2024-02-14 MED ORDER — CLONIDINE HCL 0.1 MG PO TABS: 0.1000 mg | ORAL_TABLET | Freq: Two times a day (BID) | ORAL | Status: AC

## 2024-02-14 MED ORDER — CLONAZEPAM 0.5 MG PO TABS
0.5000 mg | ORAL_TABLET | Freq: Two times a day (BID) | ORAL | Status: DC | PRN
  Administered 2024-02-14 – 2024-02-15 (×2): 0.5 mg via ORAL
  Filled 2024-02-14 (×2): qty 1

## 2024-02-14 MED ORDER — ORAL CARE MOUTH RINSE: 15.0000 mL | OROMUCOSAL | Status: DC | PRN

## 2024-02-14 MED ORDER — QUETIAPINE FUMARATE ER 300 MG PO TB24
600.0000 mg | ORAL_TABLET | Freq: Every day | ORAL | 0 refills | Status: DC
  Filled 2024-02-14: qty 60, 30d supply, fill #0

## 2024-02-14 MED ORDER — GABAPENTIN 400 MG PO CAPS
400.0000 mg | ORAL_CAPSULE | Freq: Three times a day (TID) | ORAL | 0 refills | Status: DC
  Filled 2024-02-14: qty 90, 30d supply, fill #0

## 2024-02-14 MED ORDER — ACETAMINOPHEN 325 MG PO TABS: 650.0000 mg | ORAL_TABLET | Freq: Four times a day (QID) | ORAL | Status: DC | PRN

## 2024-02-14 MED ORDER — CEPHALEXIN 500 MG PO CAPS
500.0000 mg | ORAL_CAPSULE | Freq: Two times a day (BID) | ORAL | 0 refills | Status: AC
  Filled 2024-02-14: qty 6, 3d supply, fill #0

## 2024-02-14 MED ORDER — QUETIAPINE FUMARATE ER 300 MG PO TB24: 600.0000 mg | ORAL_TABLET | Freq: Every day | ORAL | 0 refills | Status: DC

## 2024-02-14 MED ORDER — FLUCONAZOLE 150 MG PO TABS
150.0000 mg | ORAL_TABLET | Freq: Once | ORAL | 0 refills | Status: DC | PRN
  Filled 2024-02-14: qty 1, 1d supply, fill #0

## 2024-02-14 MED ORDER — CLONAZEPAM 0.5 MG PO TABS: 0.5000 mg | ORAL_TABLET | Freq: Two times a day (BID) | ORAL | Status: DC | PRN

## 2024-02-14 NOTE — Evaluation (Signed)
 Physical Therapy Evaluation Patient Details Name: Becky Wolfe MRN: [id number] DOB: 1961-01-13 Today's Date: 02/14/2024  History of Present Illness  63 y.o. female who presents to the ED 4/3 due to frequent falls. PMHx significant for TIAs, migraine headaches starting in childhood, paranoia, hallucinations, progressive memory loss, RSD, and gait disturbances.  Clinical Impression  Pt admitted with above diagnosis. Able to correctly state current month, year, location, and with effort and time, reason for admission. States she was mostly mod I at home but occasionally needed assist in/out of shower with husband's help. Using Rollator for most mobility, reports multiple falls recently. Required up to mod assist for sit to stand transfer from bed, LEs with increased tone, knees flexed while ambulating but improved slightly. Min assist for balance and RW control. Patient will benefit from intensive inpatient follow-up therapy, >3 hours/day. She  Pt currently with functional limitations due to the deficits listed below (see PT Problem List). Pt will benefit from acute skilled PT to increase their independence and safety with mobility to allow discharge.           If plan is discharge home, recommend the following: A little help with walking and/or transfers;A little help with bathing/dressing/bathroom;Assistance with cooking/housework;Direct supervision/assist for medications management;Direct supervision/assist for financial management;Assist for transportation;Help with stairs or ramp for entrance;Supervision due to cognitive status   Can travel by private vehicle        Equipment Recommendations None recommended by PT  Recommendations for Other Services  Rehab consult    Functional Status Assessment Patient has had a recent decline in their functional status and demonstrates the ability to make significant improvements in function in a reasonable and predictable amount of time.      Precautions / Restrictions Precautions Precautions: Fall Recall of Precautions/Restrictions: Impaired Restrictions Weight Bearing Restrictions Per Provider Order: No      Mobility  Bed Mobility Overal bed mobility: Needs Assistance Bed Mobility: Supine to Sit     Supine to sit: Contact guard     General bed mobility comments: Slow and effortful but able to rise and sit EOB without physical assist.    Transfers Overall transfer level: Needs assistance Equipment used: Rolling walker (2 wheels) Transfers: Sit to/from Stand Sit to Stand: Mod assist           General transfer comment: Mod assist for boost and balance. Cues for sequencing, weight shift, and hand placement. Narrow BOS initially. Leans backwards. RW for support upon standing. Good control with descent.    Ambulation/Gait Ambulation/Gait assistance: Min assist Gait Distance (Feet): 35 Feet Assistive device: Rolling walker (2 wheels) Gait Pattern/deviations: Step-through pattern, Decreased stride length, Knee flexed in stance - left, Knee flexed in stance - right, Shuffle, Narrow base of support, Trunk flexed Gait velocity: dec Gait velocity interpretation: <1.31 ft/sec, indicative of household ambulator   General Gait Details: Educated on safe AD use with RW for support. Cues for awareness and findings with gait asymmetries. Able to widen BOS gradually but knees remain flexed likely with tone. Cues for upright posture. Min assist for balance and intermitten control of RW.  Stairs            Wheelchair Mobility     Tilt Bed    Modified Rankin (Stroke Patients Only)       Balance Overall balance assessment: Needs assistance Sitting-balance support: No upper extremity supported, Feet supported Sitting balance-Leahy Scale: Fair     Standing balance support: Bilateral upper extremity supported, Reliant on  assistive device for balance Standing balance-Leahy Scale: Poor                                Pertinent Vitals/Pain Pain Assessment Pain Assessment: Faces Faces Pain Scale: Hurts little more Pain Location: BIL LEs Pain Descriptors / Indicators: Aching, Constant, Tightness Pain Intervention(s): Monitored during session, Repositioned, Limited activity within patient's tolerance    Home Living Family/patient expects to be discharged to:: Private residence Living Arrangements: Spouse/significant other Available Help at Discharge: Family;Available 24 hours/day Type of Home: House Home Access: Stairs to enter Entrance Stairs-Rails: Left Entrance Stairs-Number of Steps: 4   Home Layout: One level Home Equipment: Agricultural consultant (2 wheels);Rollator (4 wheels);Cane - single point;Shower seat;Wheelchair - manual      Prior Function Prior Level of Function : Needs assist;History of Falls (last six months)             Mobility Comments: Reports multiple falls. Uses rollator for most mobility. ADLs Comments: States mostly ind but occasionally has husband assist in/out of shower.     Extremity/Trunk Assessment   Upper Extremity Assessment Upper Extremity Assessment: Defer to OT evaluation    Lower Extremity Assessment Lower Extremity Assessment: Generalized weakness (Grossly 4/5 BIL LE. Increased tone bil)       Communication   Communication Communication: No apparent difficulties    Cognition Arousal: Alert Behavior During Therapy: WFL for tasks assessed/performed   PT - Cognitive impairments: No family/caregiver present to determine baseline, Initiation, Sequencing, Safety/Judgement (Oriented x4)                       PT - Cognition Comments: Quickly recalls month, year, location, took a little time to recall why she was admitted to hospital. Following commands: Impaired Following commands impaired: Follows multi-step commands inconsistently     Cueing Cueing Techniques: Verbal cues, Gestural cues     General Comments       Exercises General Exercises - Lower Extremity Ankle Circles/Pumps: Strengthening, Both, 10 reps, Seated Quad Sets: Strengthening, Both, 10 reps, Seated Gluteal Sets: Strengthening, 10 reps, Both, Seated   Assessment/Plan    PT Assessment Patient needs continued PT services  PT Problem List Decreased strength;Decreased range of motion;Decreased activity tolerance;Decreased balance;Decreased mobility;Decreased coordination;Decreased knowledge of use of DME;Decreased cognition;Decreased safety awareness;Decreased knowledge of precautions;Impaired tone;Pain       PT Treatment Interventions DME instruction;Gait training;Functional mobility training;Therapeutic activities;Therapeutic exercise;Balance training;Stair training;Neuromuscular re-education;Cognitive remediation;Patient/family education;Wheelchair mobility training    PT Goals (Current goals can be found in the Care Plan section)  Acute Rehab PT Goals Patient Stated Goal: Get well PT Goal Formulation: With patient Time For Goal Achievement: 02/28/24 Potential to Achieve Goals: Good    Frequency Min 2X/week     Co-evaluation               AM-PAC PT "6 Clicks" Mobility  Outcome Measure Help needed turning from your back to your side while in a flat bed without using bedrails?: A Little Help needed moving from lying on your back to sitting on the side of a flat bed without using bedrails?: A Little Help needed moving to and from a bed to a chair (including a wheelchair)?: A Lot Help needed standing up from a chair using your arms (e.g., wheelchair or bedside chair)?: A Lot Help needed to walk in hospital room?: A Little Help needed climbing 3-5 steps with a railing? : Total  6 Click Score: 14    End of Session Equipment Utilized During Treatment: Gait belt Activity Tolerance: Patient tolerated treatment well Patient left: in chair;with call bell/phone within reach;with chair alarm set (Fall matts on floor) Nurse  Communication: Mobility status PT Visit Diagnosis: Unsteadiness on feet (R26.81);Other abnormalities of gait and mobility (R26.89);Repeated falls (R29.6);Muscle weakness (generalized) (M62.81);History of falling (Z91.81);Difficulty in walking, not elsewhere classified (R26.2);Other symptoms and signs involving the nervous system (R29.898);Pain Pain - Right/Left:  (BIL) Pain - part of body: Leg    Time: 1110-1135 PT Time Calculation (min) (ACUTE ONLY): 25 min   Charges:   PT Evaluation $PT Eval Low Complexity: 1 Low PT Treatments $Gait Training: 8-22 mins PT General Charges $$ ACUTE PT VISIT: 1 Visit         Kathlyn Sacramento, PT, DPT [facility]  Rehabilitation Services Physical Therapist Office: [phone] Website: [email or web address]   Berton Mount 02/14/2024, 1:05 PM

## 2024-02-14 NOTE — Progress Notes (Signed)
  Progress Note   Patient: Becky Wolfe UJW:[id number] DOB: 1960-11-13 DOA: 02/12/2024     1 DOS: the patient was seen and examined on 02/14/2024   Brief hospital course: 63 y.o. female with medical history significant of paranoia, hallucinations, GAD, insomnia, and chronic small vessel ischemia who is well known to Behavioral health service who presented to ED with increased confusion over the past 2 weeks. Pt has long hx of hallucinations and gait instability, followed by Neurology. In the ED, head CT was neg for  acute process. UA was suggestive of UTI. Pt was given dose of rocephin empirically.    At present, pt was very drowsy and majority of history was obtained from husband at bedside. Hospitalist consulted for consideration for admission  Assessment and Plan: Toxic metabolic encephalopathy likely secondary to UTI -UA suggestive of UTI -Rocephin was started in ED, would continue treatment, anticipate treating 3 days -Continue supportive care   Hx paranoia, anxiety -Pt known to neurology and psychiatry groups, appreciate input -MRI brain unremarkable -Notch 3 ordered by Neurology, to be followed as outpatient -Pt recommended to f/u with Dr. Epimenio Foot and [facility]ology at [facility] on d/c -Per Psychiatry, agrees with decreased gapabentin of 400mg  tid. Also recs for decreasing in gabapentin to 400 mg TID, decreasing the clonidine to 0.1 mg BID, stopping hydroxyzine, and no Klonopin to 0.5 mg BID-PRN  -continued seroquel 600mg , zolof 200mg  qd   Chronic Normocytic Anemia -Hemodynamically stable   Tobacco abuse -long hx of smoking per pt's husband Will provide nicotine patch  Chronic pain -Pt follows pain clinic -cont methadone per home regimen   Subjective: More alert and interactive. Apprehensive about cutting back klonopin  Physical Exam: Vitals:   02/13/24 1452 02/13/24 2151 02/14/24 0425 02/14/24 0747  BP: 124/62 (!) 114/54 (!) 136/47 (!) 107/55  Pulse: (!)  52 (!) 56 60 (!) 48  Resp: 12 18 18 18   Temp: 98.1 F (36.7 C) 98.3 F (36.8 C) 97.6 F (36.4 C) 97.8 F (36.6 C)  TempSrc: Oral Oral Oral Oral  SpO2: 97% 97% 98% 99%  Weight: 75 kg     Height: 5\' 2"  (1.575 m)      General exam: Awake, laying in bed, in nad Respiratory system: Normal respiratory effort, no wheezing Cardiovascular system: regular rate, s1, s2 Gastrointestinal system: Soft, nondistended, positive BS Central nervous system: CN2-12 grossly intact, strength intact Extremities: Perfused, no clubbing Skin: Normal skin turgor, no notable skin lesions seen Psychiatry: Mood normal // no visual hallucinations   Data Reviewed:  Labs reviewed: Na 144, K 3.8, Cr 0.93, WBC 3.5, Hgb 11.1, Plts 192  Family Communication: Pt in room, family not at bedside  Disposition: Status is: Inpatient Remains inpatient appropriate because: severity of illness  Planned Discharge Destination: Rehab    Author: Rickey Barbara, MD 02/14/2024 6:28 PM  For on call review [email or web address].
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
## 2024-02-14 NOTE — Plan of Care (Signed)
   Problem: Education: Goal: Knowledge of General Education information will improve Description: Including pain rating scale, medication(s)/side effects and non-pharmacologic comfort measures Outcome: Completed/Met

## 2024-02-14 NOTE — Evaluation (Signed)
 Occupational Therapy Evaluation Patient Details Name: Becky Wolfe MRN: [id number] DOB: October 06, 1961 Today's Date: 02/14/2024   History of Present Illness   63 y.o. female who presents to the ED 4/3 due to frequent falls. PMHx significant for TIAs, migraine headaches starting in childhood, paranoia, hallucinations, progressive memory loss, RSD, and gait disturbances.     Clinical Impressions PTA, pt reports she was mod I for ADL and husband intermittently assists with IADL. Husband present and supportive confirms. Pt endorses several recent falls. Upon eval, pt requires up to min A for basic transfers and with decreased executive function. Pt needing up to min a for LB ADL and set-up for UB ADL. Due to significant change in functional status, recommending intensive multidisciplinary rehabilitation >3 hours/day to optimize safety and independence in ADL.       If plan is discharge home, recommend the following:   A little help with walking and/or transfers;A little help with bathing/dressing/bathroom;Assistance with cooking/housework;Help with stairs or ramp for entrance;Assist for transportation;Direct supervision/assist for financial management;Direct supervision/assist for medications management     Functional Status Assessment   Patient has had a recent decline in their functional status and demonstrates the ability to make significant improvements in function in a reasonable and predictable amount of time.     Equipment Recommendations   Other (comment) (defer)     Recommendations for Other Services   Rehab consult     Precautions/Restrictions   Precautions Precautions: Fall Recall of Precautions/Restrictions: Impaired Restrictions Weight Bearing Restrictions Per Provider Order: No     Mobility Bed Mobility Overal bed mobility: Needs Assistance Bed Mobility: Sit to Supine       Sit to supine: Supervision   General bed mobility comments: for safety,  increased time    Transfers Overall transfer level: Needs assistance Equipment used: Rolling walker (2 wheels) Transfers: Sit to/from Stand Sit to Stand: Min assist           General transfer comment: for boost and steadying. Able to static stand without RW with min a      Balance Overall balance assessment: Needs assistance Sitting-balance support: No upper extremity supported, Feet supported Sitting balance-Leahy Scale: Fair     Standing balance support: Bilateral upper extremity supported, Reliant on assistive device for balance Standing balance-Leahy Scale: Poor                             ADL either performed or assessed with clinical judgement   ADL Overall ADL's : Needs assistance/impaired Eating/Feeding: Modified independent;Sitting   Grooming: Minimal assistance;Standing Grooming Details (indicate cue type and reason): min A for balance Upper Body Bathing: Set up;Sitting   Lower Body Bathing: Minimal assistance;Sit to/from stand   Upper Body Dressing : Set up;Sitting   Lower Body Dressing: Minimal assistance;Sit to/from stand   Toilet Transfer: Minimal assistance;Rolling walker (2 wheels)           Functional mobility during ADLs: Minimal assistance;Rolling walker (2 wheels)       Vision Baseline Vision/History: 0 No visual deficits Ability to See in Adequate Light: 0 Adequate Patient Visual Report: No change from baseline Additional Comments: pt denies changes from baseline.     Perception Perception: Not tested       Praxis Praxis: Not tested       Pertinent Vitals/Pain Pain Assessment Pain Assessment: Faces Faces Pain Scale: Hurts a little bit Pain Location: BIL LEs Pain Descriptors / Indicators: Aching, Constant,  Tightness Pain Intervention(s): Limited activity within patient's tolerance, Monitored during session     Extremity/Trunk Assessment Upper Extremity Assessment Upper Extremity Assessment: Generalized  weakness   Lower Extremity Assessment Lower Extremity Assessment: Defer to PT evaluation       Communication Communication Communication: No apparent difficulties   Cognition Arousal: Alert Behavior During Therapy: WFL for tasks assessed/performed Cognition: Cognition impaired     Awareness: Intellectual awareness intact, Online awareness impaired Memory impairment (select all impairments): Short-term memory Attention impairment (select first level of impairment): Sustained attention Executive functioning impairment (select all impairments): Problem solving OT - Cognition Comments: Pt and OT alternating to name objects that start with A, B, and so on. Pt needing min cues to avoid skipping letters. pt oriented with increased time. Suspect slightly off from baseline. Fair working memory                 Following commands: Impaired Following commands impaired: Follows one step commands with increased time, Follows multi-step commands inconsistently     Cueing  General Comments   Cueing Techniques: Verbal cues;Gestural cues  Assisted RN with orthostatic vital signs. Please see vital signs flow sheet   Exercises     Shoulder Instructions      Home Living Family/patient expects to be discharged to:: Private residence Living Arrangements: Spouse/significant other Available Help at Discharge: Family;Available 24 hours/day Type of Home: House Home Access: Stairs to enter [employer] of Steps: 4 Entrance Stairs-Rails: Left Home Layout: One level     Bathroom Shower/Tub: Chief Strategy Officer: Standard     Home Equipment: Agricultural consultant (2 wheels);Rollator (4 wheels);Cane - single point;Shower seat;Wheelchair - manual          Prior Functioning/Environment Prior Level of Function : Needs assist;History of Falls (last six months)             Mobility Comments: Reports multiple falls. Uses rollator for most mobility. ADLs Comments:  States mostly ind but occasionally has husband assist in/out of shower. Husband assists with IADL such as medication management and driving.    OT Problem List: Decreased strength;Decreased activity tolerance;Impaired balance (sitting and/or standing);Decreased cognition;Decreased safety awareness;Decreased knowledge of use of DME or AE   OT Treatment/Interventions: Self-care/ADL training;Therapeutic exercise;DME and/or AE instruction;Balance training;Patient/family education;Therapeutic activities;Cognitive remediation/compensation      OT Goals(Current goals can be found in the care plan section)   Acute Rehab OT Goals Patient Stated Goal: get better and go home OT Goal Formulation: With patient Time For Goal Achievement: 02/28/24 Potential to Achieve Goals: Good   OT Frequency:  Min 2X/week    Co-evaluation              AM-PAC OT "6 Clicks" Daily Activity     Outcome Measure Help from another person eating meals?: None Help from another person taking care of personal grooming?: A Little Help from another person toileting, which includes using toliet, bedpan, or urinal?: A Little Help from another person bathing (including washing, rinsing, drying)?: A Little Help from another person to put on and taking off regular upper body clothing?: A Little Help from another person to put on and taking off regular lower body clothing?: A Little 6 Click Score: 19   End of Session Equipment Utilized During Treatment: Gait belt;Rolling walker (2 wheels)  Activity Tolerance: Patient tolerated treatment well Patient left: in bed;with call bell/phone within reach;with nursing/sitter in room;with family/visitor present  OT Visit Diagnosis: Unsteadiness on feet (R26.81);Muscle weakness (generalized) (M62.81);Other  symptoms and signs involving cognitive function                Time: 1520-1540 OT Time Calculation (min): 20 min Charges:  OT General Charges $OT Visit: 1 Visit OT  Evaluation $OT Eval Moderate Complexity: 1 Mod  Tyler Deis, OTR/L [facility] Acute Rehabilitation Office: [phone]   Becky Wolfe 02/14/2024, 5:22 PM

## 2024-02-14 NOTE — Discharge Summary (Incomplete)
 Physician Discharge Summary   Patient: Becky Wolfe MRN: [id number] DOB: [birth year]  Admit date:     02/12/2024  Discharge date: 02/14/24  Discharge Physician: Rickey Barbara   PCP: Lupita Raider, MD   Recommendations at discharge:  {Tip this will not be part of the note when signed- Example include specific recommendations for outpatient follow-up, pending tests to follow-up on. (Optional):26781}  Follow up with PCP in 1-2 weeks Follow up with Psychiatry as scheduled Follow up with Neurology as scheduled. Follow up on [place] [facility] as ordered by Neuro  Discharge Diagnoses: Principal Problem:   AMS (altered mental status)  Resolved Problems:   * No resolved hospital problems. *  Hospital Course: 63 y.o. female with medical history significant of paranoia, hallucinations, GAD, insomnia, and chronic small vessel ischemia who is well known to Behavioral health service who presented to ED with increased confusion over the past 2 weeks. Pt has long hx of hallucinations and gait instability, followed by Neurology. In the ED, head CT was neg for  acute process. UA was suggestive of UTI. Pt was given dose of rocephin empirically.    At present, pt was very drowsy and majority of history was obtained from husband at bedside. Hospitalist consulted for consideration for admission  Assessment and Plan: Toxic metabolic encephalopathy likely secondary to UTI -UA suggestive of UTI -Rocephin was started in ED, would continue empirically -Will check urine and blood cx -Follow CBC trends -Continue supportive care   Hx paranoia, anxiety -Chart reviewed. Per most recent behavioral health note on 3/27, recommendations noted to: Continue Klonopin 0.5 mg, 1 p.o. every 6 hours as needed anxiety.   Continue clonidine 0.1 mg, 1 p.o. twice daily by another provider. Continue gabapentin 800 mg, 1 p.o. 3 times daily prn. Continue hydroxyzine 10 mg, 1 p.o. twice daily as needed. Increase Seroquel XR to 600 mg  nightly. Continue Zoloft 100 mg, 2 po qd   Chronic Normocytic Anemia -Hemodynamically stable   Tobacco abuse -long hx of smoking per pt's husband -Will provide nicotine patch   {(NOTE) Pain control PDMP Statment (Optional):26782} Consultants: Psychiatry, Neurology Procedures performed:   Disposition: Home Diet recommendation:  Regular diet DISCHARGE MEDICATION: Allergies as of 02/14/2024       Reactions   Etodolac Swelling, Other (See Comments)   Facial swelling   Ambien [zolpidem Tartrate] Other (See Comments)   Headaches, memory loss   Namenda [memantine] Other (See Comments)   Headaches         Medication List     STOP taking these medications    gabapentin 800 MG tablet Commonly known as: NEURONTIN Replaced by: gabapentin 400 MG capsule       TAKE these medications    acetaminophen 500 MG tablet Commonly known as: TYLENOL Take 2 tablets (1,000 mg total) by mouth every 6 (six) hours as needed for mild pain or moderate pain.   aspirin 81 MG chewable tablet Chew by mouth daily.   buPROPion 150 MG 24 hr tablet Commonly known as: WELLBUTRIN XL TAKE 1 TABLET(150 MG) BY MOUTH DAILY   cephALEXin 500 MG capsule Commonly known as: KEFLEX Take 1 capsule (500 mg total) by mouth 2 (two) times daily for 3 days.   clonazePAM 0.5 MG tablet Commonly known as: KLONOPIN Take 1 tablet (0.5 mg total) by mouth 2 (two) times daily as needed for anxiety. What changed: See the new instructions.   cloNIDine 0.1 MG tablet Commonly known as: CATAPRES Take 1 tablet (0.1 mg total)  by mouth 2 (two) times daily. What changed: when to take this   clopidogrel 75 MG tablet Commonly known as: PLAVIX Take 1 tablet (75 mg total) by mouth daily.   fluconazole 150 MG tablet Commonly known as: Diflucan Take 1 tablet (150 mg total) by mouth once as needed for up to 1 dose (for yeast infection while on antibiotic).   gabapentin 400 MG capsule Commonly known as: NEURONTIN Take 1  capsule (400 mg total) by mouth 3 (three) times daily. Replaces: gabapentin 800 MG tablet   methadone 5 MG tablet Commonly known as: DOLOPHINE Take 1 tablet (5 mg total) by mouth every 12 (twelve) hours. What changed: when to take this   QUEtiapine 300 MG 24 hr tablet Commonly known as: SEROQUEL XR Take 2 tablets (600 mg total) by mouth at bedtime. What changed:  medication strength how much to take additional instructions   sertraline 100 MG tablet Commonly known as: ZOLOFT TAKE 2 TABLETS(200 MG) BY MOUTH DAILY        Follow-up Information     Lupita Raider, MD Follow up in 2 week(s).   Specialty: Family Medicine Why: Hospital follow up Contact information: [address] [place] [address] [phone]         Asa Lente, MD Follow up.   Specialty: Neurology Why: Hospital follow up Contact information: [address] [place] [address] [phone]         Cherie Ouch, PA-C Follow up.   Specialty: Psychiatry Why: Hospital follow up Contact information: [address] [place] [address] [phone]                Discharge Exam: Ceasar Mons Weights   02/12/24 2352 02/13/24 1452  Weight: 63.5 kg 75 kg   General exam: Awake, laying in bed, in nad Respiratory system: Normal respiratory effort, no wheezing Cardiovascular system: regular rate, s1, s2 Gastrointestinal system: Soft, nondistended, positive BS Central nervous system: CN2-12 grossly intact, strength intact Extremities: Perfused, no clubbing Skin: Normal skin turgor, no notable skin lesions seen Psychiatry: Mood normal // no visual hallucinations   Condition at discharge: fair  The results of significant diagnostics from this hospitalization (including imaging, microbiology, ancillary and laboratory) are listed below for reference.   Imaging Studies: MR BRAIN W CONTRAST Result Date: 02/13/2024 CLINICAL DATA:  Confluent white  matter disease EXAM: MRI HEAD WITH CONTRAST TECHNIQUE: Multiplanar, multiecho pulse sequences of the brain and surrounding structures were obtained with intravenous contrast. CONTRAST:  7mL GADAVIST GADOBUTROL 1 MMOL/ML IV SOLN COMPARISON:  Noncontrast brain MRI 02/13/2024 FINDINGS: This study is interpreted as an adjunct to the earlier noncontrast brain MRI. There is no abnormal contrast enhancement. No additional findings compared to the earlier study. IMPRESSION: No abnormal contrast enhancement. Electronically Signed   By: Deatra Robinson M.D.   On: 02/13/2024 22:44   MR BRAIN WO CONTRAST Result Date: 02/13/2024 CLINICAL DATA:  63 year old female neurologic deficit. EXAM: MRI HEAD WITHOUT CONTRAST TECHNIQUE: Multiplanar, multiecho pulse sequences of the brain and surrounding structures were obtained without intravenous contrast. COMPARISON:  Head CT 0012 hours today and earlier. Brain MRI 05/17/2023. FINDINGS: Brain: No restricted diffusion to suggest acute infarction. No midline shift, mass effect, evidence of mass lesion, ventriculomegaly, extra-axial collection or acute intracranial hemorrhage. Cervicomedullary junction and pituitary are within normal limits. Widespread and confluent bilateral cerebral white matter T2 and FLAIR hyperintensity is nonspecific, stable from the MRI last year. No cortical encephalomalacia identified. No chronic  cerebral blood products on SWI. And deep gray nuclei, brainstem and cerebellum relatively spared as before. Vascular: Major intracranial vascular flow voids are stable since last year, dominant distal right vertebral artery. Skull and upper cervical spine: Prior cervical ACDF. Otherwise negative for age visible cervical spine. Bone marrow signal stable and within normal limits. Sinuses/Orbits: Disconjugate gaze now. Otherwise negative orbits. Paranasal sinus mucosal thickening has regressed since last year. Other: Left mastoid effusion has regressed but not resolved.  Negative visible nasopharynx. Right mastoids remain clear. IMPRESSION: 1. No acute intracranial abnormality. 2. Advanced but nonspecific cerebral white matter disease, stable from MRI last year. 3. Regressed paranasal sinus and left mastoid inflammation since last year. Electronically Signed   By: Odessa Fleming M.D.   On: 02/13/2024 05:10   CT HEAD WO CONTRAST Result Date: 02/13/2024 CLINICAL DATA:  Mental status change, unknown cause Neuro deficit, acute, stroke suspected EXAM: CT HEAD WITHOUT CONTRAST TECHNIQUE: Contiguous axial images were obtained from the base of the skull through the vertex without intravenous contrast. RADIATION DOSE REDUCTION: This exam was performed according to the departmental dose-optimization program which includes automated exposure control, adjustment of the mA and/or kV according to patient size and/or use of iterative reconstruction technique. COMPARISON:  12/22/2023 FINDINGS: Brain: Advanced chronic white matter low-density, likely chronic small vessel disease, stable. Mild cerebral atrophy. No acute intracranial abnormality. Specifically, no hemorrhage, hydrocephalus, mass lesion, acute infarction, or significant intracranial injury. Vascular: No hyperdense vessel or unexpected calcification. Skull: No acute calvarial abnormality. Sinuses/Orbits: No acute findings Other: None IMPRESSION: Atrophy, chronic microvascular disease. No acute intracranial abnormality. Electronically Signed   By: Charlett Nose M.D.   On: 02/13/2024 00:19   DG Knee Complete 4 Views Right Result Date: 02/13/2024 CLINICAL DATA:  Fall, right knee pain EXAM: RIGHT KNEE - COMPLETE 4+ VIEW COMPARISON:  None Available. FINDINGS: Joint space narrowing and early spurring in the medial compartment. No acute bony abnormality. Specifically, no fracture, subluxation, or dislocation. No joint effusion. IMPRESSION: Early osteoarthritis in the medial compartment. No acute bony abnormality. Electronically Signed   By: Charlett Nose M.D.   On: 02/13/2024 00:11    Microbiology: Results for orders placed or performed during the hospital encounter of 02/12/24  Culture, blood (Routine X 2) w Reflex to ID Panel     Status: None (Preliminary result)   Collection Time: 02/13/24  9:48 AM   Specimen: BLOOD  Result Value Ref Range Status   Specimen Description BLOOD SITE NOT SPECIFIED  Final   Special Requests   Final    BOTTLES DRAWN AEROBIC AND ANAEROBIC Blood Culture results may not be optimal due to an inadequate volume of blood received in culture bottles   Culture   Final    NO GROWTH < 24 HOURS Performed at [facility], [address], [place], [place] [address]    Report Status PENDING  Incomplete  Urine Culture (for pregnant, neutropenic or urologic patients or patients with an indwelling urinary catheter)     Status: Abnormal   Collection Time: 02/13/24  9:48 AM   Specimen: Urine, Clean Catch  Result Value Ref Range Status   Specimen Description URINE, CLEAN CATCH  Final   Special Requests   Final    NONE Performed at [facility], [address], [place], [place] [address]    Culture MULTIPLE SPECIES PRESENT, SUGGEST RECOLLECTION (A)  Final   Report Status 02/14/2024 FINAL  Final  Culture, blood (Routine X 2) w Reflex  to ID Panel     Status: None (Preliminary result)   Collection Time: 02/13/24  3:33 PM   Specimen: BLOOD LEFT ARM  Result Value Ref Range Status   Specimen Description BLOOD LEFT ARM  Final   Special Requests   Final    BOTTLES DRAWN AEROBIC AND ANAEROBIC Blood Culture adequate volume   Culture   Final    NO GROWTH < 24 HOURS Performed at [facility], [address], [place], [place] [address]    Report Status PENDING  Incomplete    Labs: CBC: Recent Labs  Lab 02/12/24 2356 02/12/24 2357 02/13/24 1205 02/14/24 0639  WBC 4.6  --  3.3* 3.5*  NEUTROABS 2.3  --   --   --   HGB 11.5* 11.6* 11.5* 11.1*  HCT 33.6* 34.0* 34.5* 32.7*  MCV 85.1  --  88.0 85.6   PLT 207  --  183 192   Basic Metabolic Panel: Recent Labs  Lab 02/12/24 2356 02/12/24 2357 02/13/24 1205 02/14/24 0639  NA 140 143  --  144  K 3.6 3.6  --  3.8  CL 107 106  --  110  CO2 25  --   --  26  GLUCOSE 121* 113*  --  127*  BUN 8 8  --  8  CREATININE 0.99 1.00 0.77 0.93  CALCIUM 8.9  --   --  8.8*   Liver Function Tests: Recent Labs  Lab 02/12/24 2356 02/14/24 0639  AST 43* 30  ALT 33 28  ALKPHOS 88 75  BILITOT 0.5 0.4  PROT 7.2 6.6  ALBUMIN 3.7 3.2*   CBG: No results for input(s): "GLUCAP" in the last 168 hours.  Discharge time spent: less than 30 minutes.  Signed: Rickey Barbara, MD [facility] 02/14/2024

## 2024-02-14 NOTE — Hospital Course (Signed)
 63 y.o. female with medical history significant of paranoia, hallucinations, GAD, insomnia, and chronic small vessel ischemia who is well known to Behavioral health service who presented to ED with increased confusion over the past 2 weeks. Pt has long hx of hallucinations and gait instability, followed by Neurology. In the ED, head CT was neg for  acute process. UA was suggestive of UTI. Pt was given dose of rocephin empirically.    At present, pt was very drowsy and majority of history was obtained from husband at bedside. Hospitalist consulted for consideration for admission

## 2024-02-15 DIAGNOSIS — Z9189 Other specified personal risk factors, not elsewhere classified: Secondary | ICD-10-CM | POA: Diagnosis not present

## 2024-02-15 DIAGNOSIS — R8290 Unspecified abnormal findings in urine: Secondary | ICD-10-CM | POA: Diagnosis not present

## 2024-02-15 DIAGNOSIS — R4182 Altered mental status, unspecified: Secondary | ICD-10-CM | POA: Diagnosis not present

## 2024-02-15 NOTE — Progress Notes (Signed)
   02/15/24 1652  Mobility  Activity Ambulated with assistance in hallway  Level of Assistance Contact guard assist, steadying assist  Assistive Device Front wheel walker  Distance Ambulated (ft) 75 ft  Activity Response Tolerated fair  Mobility Referral Yes  Mobility visit 1 Mobility  Mobility Specialist Start Time (ACUTE ONLY) 1638  Mobility Specialist Stop Time (ACUTE ONLY) 1652  Mobility Specialist Time Calculation (min) (ACUTE ONLY) 14 min   Mobility Specialist: Progress Note  Pt agreeable to mobility session - received in bed. C/o BLE pain knees to toes. Pt with unsteady gait. Needs cues for use of RW. Returned to chair with all needs met - call bell within reach. Chair alarm on.   Barnie Mort, BS Mobility Specialist Please contact via SecureChat or  Rehab office at [phone].

## 2024-02-15 NOTE — Progress Notes (Signed)
  Progress Note   Patient: Becky Wolfe ZOX:[id number] DOB: Jan 25, 1961 DOA: 02/12/2024     2 DOS: the patient was seen and examined on 02/15/2024   Brief hospital course: 63 y.o. female with medical history significant of paranoia, hallucinations, GAD, insomnia, and chronic small vessel ischemia who is well known to Behavioral health service who presented to ED with increased confusion over the past 2 weeks. Pt has long hx of hallucinations and gait instability, followed by Neurology. In the ED, head CT was neg for  acute process. UA was suggestive of UTI. Pt was given dose of rocephin empirically.    At present, pt was very drowsy and majority of history was obtained from husband at bedside. Hospitalist consulted for consideration for admission  Assessment and Plan: Toxic metabolic encephalopathy likely secondary to UTI -UA suggestive of UTI -Rocephin was started in ED, would continue treatment, anticipate treating 3 days -Continue supportive care -PT/OT recs for CIR, pending consult   Hx paranoia, anxiety -Pt known to neurology and psychiatry groups, appreciate input -MRI brain unremarkable -Notch 3 ordered by Neurology, to be followed as outpatient -Pt recommended to f/u with Dr. Epimenio Foot and Neuropsychology at [facility] on d/c -Per Psychiatry, agrees with decreased gapabentin of 400mg  tid. Also recs for decreasing in gabapentin to 400 mg TID, decreasing the clonidine to 0.1 mg BID, stopping hydroxyzine, and no Klonopin to 0.5 mg BID-PRN  -continued seroquel 600mg , zolof 200mg  every day -appreciate input by Psychiatry and Neuro   Chronic Normocytic Anemia -Hemodynamically stable   Tobacco abuse -long hx of smoking per pt's husband Will provide nicotine patch  Chronic pain -Pt follows pain clinic -cont methadone per home regimen   Subjective: Feeling better. Without complaints  Physical Exam: Vitals:   02/14/24 0747 02/14/24 2004 02/15/24 0435 02/15/24 0936   BP: (!) 107/55 134/64 (!) 138/53 (!) 130/47  Pulse: (!) 48 (!) 54 (!) 46 (!) 55  Resp: 18 18 18 18   Temp: 97.8 F (36.6 C) 98.1 F (36.7 C) 98.5 F (36.9 C) (!) 97.5 F (36.4 C)  TempSrc: Oral Oral  Oral  SpO2: 99% 99% 96% 97%  Weight:      Height:       General exam: Conversant, in no acute distress Respiratory system: normal chest rise, clear, no audible wheezing Cardiovascular system: regular rhythm, s1-s2 Gastrointestinal system: Nondistended, nontender, pos BS Central nervous system: No seizures, no tremors Extremities: No cyanosis, no joint deformities Skin: No rashes, no pallor Psychiatry: Affect normal // no auditory hallucinations   Data Reviewed:  There are no new results to review at this time.  Family Communication: Pt in room, family not at bedside  Disposition: Status is: Inpatient Remains inpatient appropriate because: severity of illness  Planned Discharge Destination: Rehab    Author: Rickey Barbara, MD 02/15/2024 4:59 PM  For on call review [email or web address].

## 2024-02-16 ENCOUNTER — Other Ambulatory Visit (HOSPITAL_COMMUNITY): Payer: Self-pay

## 2024-02-16 ENCOUNTER — Inpatient Hospital Stay (HOSPITAL_COMMUNITY)

## 2024-02-16 DIAGNOSIS — R8290 Unspecified abnormal findings in urine: Secondary | ICD-10-CM | POA: Diagnosis not present

## 2024-02-16 DIAGNOSIS — R4182 Altered mental status, unspecified: Secondary | ICD-10-CM | POA: Diagnosis not present

## 2024-02-16 DIAGNOSIS — Z9189 Other specified personal risk factors, not elsewhere classified: Secondary | ICD-10-CM | POA: Diagnosis not present

## 2024-02-16 NOTE — Progress Notes (Signed)
 Inpatient Rehab Admissions Coordinator:    CIR consult received. Discussed with rehab MD who felt that Pt.'s payor is unlikely to approve and it probably makes more sense for her to d/c home with [facility]. I spoke to Pt. And she states that is her preference as well. CIR will sign off.   Megan Salon, MS, CCC-SLP Rehab Admissions Coordinator  [phone] (celll) [phone] (office)

## 2024-02-16 NOTE — Discharge Summary (Signed)
 Physician Discharge Summary   Patient: Becky Wolfe MRN: [id number] DOB: 27-May-1961  Admit date:     02/12/2024  Discharge date: 02/16/24  Discharge Physician: Rickey Barbara   PCP: Lupita Raider, MD   Recommendations at discharge:    Follow up with PCP in 1-2 weeks Follow up with Neurology as scheduled Follow up with Psychiatry as scheduled Follow up on Notch 3 that was ordered here, as outpatient  Discharge Diagnoses: Principal Problem:   AMS (altered mental status)  Resolved Problems:   * No resolved hospital problems. *  Hospital Course: 63 y.o. female with medical history significant of paranoia, hallucinations, GAD, insomnia, and chronic small vessel ischemia who is well known to Behavioral health service who presented to ED with increased confusion over the past 2 weeks. Pt has long hx of hallucinations and gait instability, followed by Neurology. In the ED, head CT was neg for  acute process. UA was suggestive of UTI. Pt was given dose of rocephin empirically.    At present, pt was very drowsy and majority of history was obtained from husband at bedside. Hospitalist consulted for consideration for admission  Assessment and Plan: Toxic metabolic encephalopathy likely secondary to UTI -UA suggestive of UTI -Rocephin was started in ED, treated 3 days -Continue supportive care -PT/OT recs for CIR, however pt elected to go home instead with outpatient PT. See CIR and social work documentation   Hx paranoia, anxiety -Pt known to neurology and psychiatry groups, appreciate input -MRI brain unremarkable -Notch 3 ordered by Neurology, to be followed as outpatient -Pt recommended to f/u with Dr. Epimenio Foot and Neuropsychology at [facility] on d/c -Per Psychiatry, agrees with decreased gapabentin of 400mg  tid. Also recs for decreasing in gabapentin to 400 mg TID, decreasing the clonidine to 0.1 mg BID, stopping hydroxyzine, and no Klonopin to 0.5 mg BID-PRN   -continued seroquel 600mg , zolof 200mg  every day -appreciate input by Psychiatry and Neuro   Chronic Normocytic Anemia -Hemodynamically stable   Tobacco abuse -long hx of smoking per pt's husband Provided nicotine patch   Chronic pain -Pt follows pain clinic -cont methadone per home regimen -Recommend close f/u with pain clinilc -Pt did complain pain around the L hip. Ordered and reviewed L hip/pelvic xray. Some DJD but no acute fracture seen    Consultants: Psychiatry, Neurology Procedures performed:   Disposition: Home Diet recommendation:  Regular diet DISCHARGE MEDICATION: Allergies as of 02/16/2024       Reactions   Etodolac Swelling, Other (See Comments)   Facial swelling   Ambien [zolpidem Tartrate] Other (See Comments)   Headaches, memory loss   Namenda [memantine] Other (See Comments)   Headaches         Medication List     STOP taking these medications    gabapentin 800 MG tablet Commonly known as: NEURONTIN Replaced by: gabapentin 400 MG capsule       TAKE these medications    acetaminophen 500 MG tablet Commonly known as: TYLENOL Take 2 tablets (1,000 mg total) by mouth every 6 (six) hours as needed for mild pain or moderate pain.   aspirin 81 MG chewable tablet Chew by mouth daily.   buPROPion 150 MG 24 hr tablet Commonly known as: WELLBUTRIN XL TAKE 1 TABLET(150 MG) BY MOUTH DAILY   cephALEXin 500 MG capsule Commonly known as: KEFLEX Take 1 capsule (500 mg total) by mouth 2 (two) times daily for 3 days.   clonazePAM 0.5 MG tablet Commonly known as:  KLONOPIN Take 1 tablet (0.5 mg total) by mouth 2 (two) times daily as needed for anxiety. What changed: See the new instructions.   cloNIDine 0.1 MG tablet Commonly known as: CATAPRES Take 1 tablet (0.1 mg total) by mouth 2 (two) times daily. What changed: when to take this   clopidogrel 75 MG tablet Commonly known as: PLAVIX Take 1 tablet (75 mg total) by mouth daily.    fluconazole 150 MG tablet Commonly known as: Diflucan Take 1 tablet (150 mg total) by mouth once as needed for up to 1 dose (for yeast infection while on antibiotic).   gabapentin 400 MG capsule Commonly known as: NEURONTIN Take 1 capsule (400 mg total) by mouth 3 (three) times daily. Replaces: gabapentin 800 MG tablet   methadone 5 MG tablet Commonly known as: DOLOPHINE Take 1 tablet (5 mg total) by mouth every 12 (twelve) hours. What changed: when to take this   QUEtiapine 300 MG 24 hr tablet Commonly known as: SEROQUEL XR Take 2 tablets (600 mg total) by mouth at bedtime. What changed:  medication strength how much to take additional instructions   sertraline 100 MG tablet Commonly known as: ZOLOFT TAKE 2 TABLETS(200 MG) BY MOUTH DAILY        Follow-up Information     Lupita Raider, MD Follow up in 2 week(s).   Specialty: Family Medicine Why: Hospital follow up Contact information: [address] [place] [address] [phone]         Asa Lente, MD Follow up.   Specialty: Neurology Why: Hospital follow up Contact information: [address] [place] [address] [phone]         Cherie Ouch, PA-C Follow up.   Specialty: Psychiatry Why: Hospital follow up Contact information: [address] [place] [address] [phone]         [facility] Follow up.   Specialty: Home Health Services Why: Someone will call you to schedule first home visit. Contact information: [address] [place] [address] [phone]                Discharge Exam: Ceasar Mons Weights   02/12/24 2352 02/13/24 1452  Weight: 63.5 kg 75 kg   General exam: Awake, laying in bed, in nad Respiratory system: Normal respiratory effort, no wheezing Cardiovascular system: regular rate, s1, s2 Gastrointestinal system: Soft, nondistended, positive BS Central nervous system: CN2-12 grossly  intact, strength intact Extremities: Perfused, no clubbing Skin: Normal skin turgor, no notable skin lesions seen Psychiatry: Mood normal // no visual hallucinations   Condition at discharge: fair  The results of significant diagnostics from this hospitalization (including imaging, microbiology, ancillary and laboratory) are listed below for reference.   Imaging Studies: DG HIP UNILAT WITH PELVIS 2-3 VIEWS LEFT Result Date: 02/16/2024 CLINICAL DATA:  Hip pain after fall. EXAM: DG HIP (WITH OR WITHOUT PELVIS) 2-3V LEFT COMPARISON:  None Available. FINDINGS: No acute fracture of the pelvis or left hip. No hip dislocation, femoral head is well seated. The hip joint spaces preserved. There is slight lateral acetabular spurring. Pubic rami are intact. Pubic symphysis and sacroiliac joints are congruent. IMPRESSION: 1. No acute fracture or subluxation of the pelvis or left hip. 2. Mild degenerative acetabular spurring. Electronically Signed   By: Narda Rutherford M.D.   On: 02/16/2024 15:53   MR BRAIN W CONTRAST Result Date: 02/13/2024 CLINICAL DATA:  Confluent white matter disease EXAM: MRI HEAD WITH CONTRAST TECHNIQUE: Multiplanar,  multiecho pulse sequences of the brain and surrounding structures were obtained with intravenous contrast. CONTRAST:  7mL GADAVIST GADOBUTROL 1 MMOL/ML IV SOLN COMPARISON:  Noncontrast brain MRI 02/13/2024 FINDINGS: This study is interpreted as an adjunct to the earlier noncontrast brain MRI. There is no abnormal contrast enhancement. No additional findings compared to the earlier study. IMPRESSION: No abnormal contrast enhancement. Electronically Signed   By: Deatra Robinson M.D.   On: 02/13/2024 22:44   MR BRAIN WO CONTRAST Result Date: 02/13/2024 CLINICAL DATA:  63 year old female neurologic deficit. EXAM: MRI HEAD WITHOUT CONTRAST TECHNIQUE: Multiplanar, multiecho pulse sequences of the brain and surrounding structures were obtained without intravenous contrast. COMPARISON:   Head CT 0012 hours today and earlier. Brain MRI 05/17/2023. FINDINGS: Brain: No restricted diffusion to suggest acute infarction. No midline shift, mass effect, evidence of mass lesion, ventriculomegaly, extra-axial collection or acute intracranial hemorrhage. Cervicomedullary junction and pituitary are within normal limits. Widespread and confluent bilateral cerebral white matter T2 and FLAIR hyperintensity is nonspecific, stable from the MRI last year. No cortical encephalomalacia identified. No chronic cerebral blood products on SWI. And deep gray nuclei, brainstem and cerebellum relatively spared as before. Vascular: Major intracranial vascular flow voids are stable since last year, dominant distal right vertebral artery. Skull and upper cervical spine: Prior cervical ACDF. Otherwise negative for age visible cervical spine. Bone marrow signal stable and within normal limits. Sinuses/Orbits: Disconjugate gaze now. Otherwise negative orbits. Paranasal sinus mucosal thickening has regressed since last year. Other: Left mastoid effusion has regressed but not resolved. Negative visible nasopharynx. Right mastoids remain clear. IMPRESSION: 1. No acute intracranial abnormality. 2. Advanced but nonspecific cerebral white matter disease, stable from MRI last year. 3. Regressed paranasal sinus and left mastoid inflammation since last year. Electronically Signed   By: Odessa Fleming M.D.   On: 02/13/2024 05:10   CT HEAD WO CONTRAST Result Date: 02/13/2024 CLINICAL DATA:  Mental status change, unknown cause Neuro deficit, acute, stroke suspected EXAM: CT HEAD WITHOUT CONTRAST TECHNIQUE: Contiguous axial images were obtained from the base of the skull through the vertex without intravenous contrast. RADIATION DOSE REDUCTION: This exam was performed according to the departmental dose-optimization program which includes automated exposure control, adjustment of the mA and/or kV according to patient size and/or use of iterative  reconstruction technique. COMPARISON:  12/22/2023 FINDINGS: Brain: Advanced chronic white matter low-density, likely chronic small vessel disease, stable. Mild cerebral atrophy. No acute intracranial abnormality. Specifically, no hemorrhage, hydrocephalus, mass lesion, acute infarction, or significant intracranial injury. Vascular: No hyperdense vessel or unexpected calcification. Skull: No acute calvarial abnormality. Sinuses/Orbits: No acute findings Other: None IMPRESSION: Atrophy, chronic microvascular disease. No acute intracranial abnormality. Electronically Signed   By: Charlett Nose M.D.   On: 02/13/2024 00:19   DG Knee Complete 4 Views Right Result Date: 02/13/2024 CLINICAL DATA:  Fall, right knee pain EXAM: RIGHT KNEE - COMPLETE 4+ VIEW COMPARISON:  None Available. FINDINGS: Joint space narrowing and early spurring in the medial compartment. No acute bony abnormality. Specifically, no fracture, subluxation, or dislocation. No joint effusion. IMPRESSION: Early osteoarthritis in the medial compartment. No acute bony abnormality. Electronically Signed   By: Charlett Nose M.D.   On: 02/13/2024 00:11    Microbiology: Results for orders placed or performed during the hospital encounter of 02/12/24  Culture, blood (Routine X 2) w Reflex to ID Panel     Status: None (Preliminary result)   Collection Time: 02/13/24  9:48 AM   Specimen: BLOOD  Result Value Ref  Range Status   Specimen Description BLOOD SITE NOT SPECIFIED  Final   Special Requests   Final    BOTTLES DRAWN AEROBIC AND ANAEROBIC Blood Culture results may not be optimal due to an inadequate volume of blood received in culture bottles   Culture   Final    NO GROWTH 3 DAYS Performed at [facility], [address], [place], [place] [address]    Report Status PENDING  Incomplete  Urine Culture (for pregnant, neutropenic or urologic patients or patients with an indwelling urinary catheter)     Status: Abnormal   Collection Time:  02/13/24  9:48 AM   Specimen: Urine, Clean Catch  Result Value Ref Range Status   Specimen Description URINE, CLEAN CATCH  Final   Special Requests   Final    NONE Performed at [facility], [address] [place], [place] [address]    Culture MULTIPLE SPECIES PRESENT, SUGGEST RECOLLECTION (A)  Final   Report Status 02/14/2024 FINAL  Final  Culture, blood (Routine X 2) w Reflex to ID Panel     Status: None (Preliminary result)   Collection Time: 02/13/24  3:33 PM   Specimen: BLOOD LEFT ARM  Result Value Ref Range Status   Specimen Description BLOOD LEFT ARM  Final   Special Requests   Final    BOTTLES DRAWN AEROBIC AND ANAEROBIC Blood Culture adequate volume   Culture   Final    NO GROWTH 3 DAYS Performed at [facility], [address], [place], [place] [address]    Report Status PENDING  Incomplete    Labs: CBC: Recent Labs  Lab 02/12/24 2356 02/12/24 2357 02/13/24 1205 02/14/24 0639  WBC 4.6  --  3.3* 3.5*  NEUTROABS 2.3  --   --   --   HGB 11.5* 11.6* 11.5* 11.1*  HCT 33.6* 34.0* 34.5* 32.7*  MCV 85.1  --  88.0 85.6  PLT 207  --  183 192   Basic Metabolic Panel: Recent Labs  Lab 02/12/24 2356 02/12/24 2357 02/13/24 1205 02/14/24 0639  NA 140 143  --  144  K 3.6 3.6  --  3.8  CL 107 106  --  110  CO2 25  --   --  26  GLUCOSE 121* 113*  --  127*  BUN 8 8  --  8  CREATININE 0.99 1.00 0.77 0.93  CALCIUM 8.9  --   --  8.8*   Liver Function Tests: Recent Labs  Lab 02/12/24 2356 02/14/24 0639  AST 43* 30  ALT 33 28  ALKPHOS 88 75  BILITOT 0.5 0.4  PROT 7.2 6.6  ALBUMIN 3.7 3.2*   CBG: No results for input(s): "GLUCAP" in the last 168 hours.  Discharge time spent: less than 30 minutes.  Signed: Rickey Barbara, MD [facility] 02/16/2024

## 2024-02-16 NOTE — Plan of Care (Signed)
  Problem: Health Behavior/Discharge Planning: Goal: Ability to manage health-related needs will improve Outcome: Adequate for Discharge   Problem: Clinical Measurements: Goal: Ability to maintain clinical measurements within normal limits will improve Outcome: Adequate for Discharge Goal: Will remain free from infection Outcome: Adequate for Discharge Goal: Diagnostic test results will improve Outcome: Adequate for Discharge Goal: Respiratory complications will improve Outcome: Adequate for Discharge Goal: Cardiovascular complication will be avoided Outcome: Adequate for Discharge   Problem: Activity: Goal: Risk for activity intolerance will decrease Outcome: Adequate for Discharge   Problem: Nutrition: Goal: Adequate nutrition will be maintained Outcome: Adequate for Discharge   Problem: Coping: Goal: Level of anxiety will decrease Outcome: Adequate for Discharge   Problem: Elimination: Goal: Will not experience complications related to bowel motility Outcome: Adequate for Discharge Goal: Will not experience complications related to urinary retention Outcome: Adequate for Discharge   Problem: Pain Managment: Goal: General experience of comfort will improve and/or be controlled Outcome: Adequate for Discharge   Problem: Safety: Goal: Ability to remain free from injury will improve Outcome: Adequate for Discharge   Problem: Skin Integrity: Goal: Risk for impaired skin integrity will decrease Outcome: Adequate for Discharge

## 2024-02-16 NOTE — Progress Notes (Signed)
 Physical Therapy Treatment  Patient Details Name: Becky Wolfe MRN: [id number] DOB: December 22, 1960 Today's Date: 02/16/2024   History of Present Illness 63 y.o. female who presents to the ED 4/3 due to frequent falls. PMHx significant for TIAs, migraine headaches starting in childhood, paranoia, hallucinations, progressive memory loss, RSD, and gait disturbances.    PT Comments  Pt progressing towards physical therapy goals. Focus of session was ADL's and ambulation in room. Pt reports feeling improved today, and is inquiring about returning home with family support. If husband is able to provide CGA to min assist throughout functional mobility, then feel return home with HHPT to follow up would be reasonable.  Will continue to follow and progress as able per POC.    If plan is discharge home, recommend the following: A little help with walking and/or transfers;A little help with bathing/dressing/bathroom;Assistance with cooking/housework;Direct supervision/assist for medications management;Direct supervision/assist for financial management;Assist for transportation;Help with stairs or ramp for entrance;Supervision due to cognitive status   Can travel by private vehicle        Equipment Recommendations  None recommended by PT    Recommendations for Other Services Rehab consult     Precautions / Restrictions Precautions Precautions: Fall Recall of Precautions/Restrictions: Impaired Restrictions Weight Bearing Restrictions Per Provider Order: No     Mobility  Bed Mobility Overal bed mobility: Needs Assistance Bed Mobility: Supine to Sit     Supine to sit: Supervision     General bed mobility comments: Increased time and effort but no assist required.    Transfers Overall transfer level: Needs assistance Equipment used: Rolling walker (2 wheels) Transfers: Sit to/from Stand Sit to Stand: Min assist           General transfer comment: Light assist to power up to full  stand. VC's for hand placement on seated surface for safety.    Ambulation/Gait Ambulation/Gait assistance: Contact guard assist Gait Distance (Feet): 25 Feet Assistive device: Rolling walker (2 wheels) Gait Pattern/deviations: Step-through pattern, Decreased stride length, Trunk flexed Gait velocity: Decreased Gait velocity interpretation: <1.31 ft/sec, indicative of household ambulator   General Gait Details: In room only as IV team present to place IV. Pt reports ambulating with staff earlier this morning.   Stairs             Wheelchair Mobility     Tilt Bed    Modified Rankin (Stroke Patients Only)       Balance Overall balance assessment: Needs assistance Sitting-balance support: No upper extremity supported, Feet supported Sitting balance-Leahy Scale: Fair     Standing balance support: Bilateral upper extremity supported, Reliant on assistive device for balance Standing balance-Leahy Scale: Poor                              Communication Communication Communication: No apparent difficulties  Cognition Arousal: Alert Behavior During Therapy: WFL for tasks assessed/performed   PT - Cognitive impairments: No family/caregiver present to determine baseline, Initiation, Sequencing, Safety/Judgement                         Following commands: Impaired Following commands impaired: Follows one step commands with increased time, Follows multi-step commands inconsistently    Cueing Cueing Techniques: Verbal cues, Gestural cues  Exercises General Exercises - Lower Extremity Long Arc Quad: 10 reps, Both    General Comments General comments (skin integrity, edema, etc.): Pt with need for BM, motivated  to sit on the toilet. After successful BM, pt able to perform hygiene without assist.      Pertinent Vitals/Pain Pain Assessment Pain Assessment: Faces Faces Pain Scale: Hurts a little bit Pain Location: BIL LEs Pain Descriptors /  Indicators: Aching, Constant, Tightness Pain Intervention(s): Limited activity within patient's tolerance, Monitored during session, Repositioned    Home Living                          Prior Function            PT Goals (current goals can now be found in the care plan section) Acute Rehab PT Goals Patient Stated Goal: Get well PT Goal Formulation: With patient Time For Goal Achievement: 02/28/24 Potential to Achieve Goals: Good Progress towards PT goals: Progressing toward goals    Frequency    Min 2X/week      PT Plan      Co-evaluation              AM-PAC PT "6 Clicks" Mobility   Outcome Measure  Help needed turning from your back to your side while in a flat bed without using bedrails?: A Little Help needed moving from lying on your back to sitting on the side of a flat bed without using bedrails?: A Little Help needed moving to and from a bed to a chair (including a wheelchair)?: A Little Help needed standing up from a chair using your arms (e.g., wheelchair or bedside chair)?: A Little Help needed to walk in hospital room?: A Little Help needed climbing 3-5 steps with a railing? : A Little 6 Click Score: 18    End of Session Equipment Utilized During Treatment: Gait belt Activity Tolerance: Patient tolerated treatment well Patient left: in chair;with call bell/phone within reach;with chair alarm set Nurse Communication: Mobility status PT Visit Diagnosis: Unsteadiness on feet (R26.81);Other abnormalities of gait and mobility (R26.89);Repeated falls (R29.6);Muscle weakness (generalized) (M62.81);History of falling (Z91.81);Difficulty in walking, not elsewhere classified (R26.2);Other symptoms and signs involving the nervous system (R29.898);Pain Pain - Right/Left:  (Bilateral) Pain - part of body: Leg     Time: 0930-1005 PT Time Calculation (min) (ACUTE ONLY): 35 min  Charges:    $Gait Training: 8-22 mins $Therapeutic Activity: 8-22  mins PT General Charges $$ ACUTE PT VISIT: 1 Visit                     Conni Slipper, PT, DPT Acute Rehabilitation Services Secure Chat Preferred Office: [phone]    Marylynn Pearson 02/16/2024, 12:34 PM

## 2024-02-16 NOTE — Care Management Important Message (Signed)
 Important Message  Patient Details  Name: Becky Wolfe MRN: [id number] Date of Birth: [birth year]   Important Message Given:  Yes - Medicare IM     Dorena Bodo 02/16/2024, 3:22 PM

## 2024-02-16 NOTE — TOC Transition Note (Signed)
 Transition of Care [facility]) - Discharge Note   Patient Details  Name: Becky Wolfe MRN: [id number] Date of Birth: July 14, 1961  Transition of Care [facility]) CM/SW Contact:  Becky Wolfe, Becky Coria, RN Phone Number: 02/16/2024, 4:14 PM   Clinical Narrative:     Patient is scheduled for discharge today.  Readmission Risk Assessment done. Home health info, Outpatient referral, hospital f/u and discharge instructions on AVS. Prescriptions sent to [facility] pharmacy and patient will receive meds prior discharge. Husband, Becky Wolfe to transport at discharge.  No further TOC needs noted.      Final next level of care: Home w Home Health Services Barriers to Discharge: Barriers Resolved   Patient Goals and CMS Choice Patient states their goals for this hospitalization and ongoing recovery are:: To return home CMS Medicare.gov Compare Post Acute Care list provided to:: Patient Choice offered to / list presented to : Patient      Discharge Placement                Patient to be transferred to facility by: Husband Name of family member notified: Becky Wolfe    Discharge Plan and Services Additional resources added to the After Visit Summary for                  DME Arranged: N/A DME Agency: NA       HH Arranged: PT, OT, Social Work [employer] Agency: [facility] Date HH Agency Contacted: 02/16/24 Time HH Agency Contacted: 1334 Representative spoke with at [facility] Agency: Becky Wolfe  Social Drivers of Health (SDOH) Interventions SDOH Screenings   Food Insecurity: No Food Insecurity (02/13/2024)  Housing: Low Risk  (02/13/2024)  Transportation Needs: No Transportation Needs (02/13/2024)  Utilities: Not At Risk (02/13/2024)  Depression (PHQ2-9): Medium Risk (07/03/2020)  Financial Resource Strain: Low Risk  (07/03/2020)  Physical Activity: Inactive (07/03/2020)  Social Connections: Unknown (03/12/2022)   Received from [facility]  Stress: Stress Concern Present (07/03/2020)  Tobacco  Use: High Risk (02/12/2024)     Readmission Risk Interventions    02/16/2024    4:12 PM  Readmission Risk Prevention Plan  Transportation Screening Complete  PCP or Specialist Appt within 3-5 Days Complete  HRI or Home Care Consult Complete  Social Work Consult for Recovery Care Planning/Counseling Complete  Palliative Care Screening Not Applicable  Medication Review [employer]) Referral to Pharmacy

## 2024-02-16 NOTE — Progress Notes (Signed)
 Mobility Specialist Progress Note:    02/16/24 1100  Mobility  Activity Ambulated with assistance in hallway  Level of Assistance Contact guard assist, steadying assist  Assistive Device Front wheel walker  Distance Ambulated (ft) 80 ft  Activity Response Tolerated well  Mobility Referral Yes  Mobility visit 1 Mobility  Mobility Specialist Start Time (ACUTE ONLY) [id number]  Mobility Specialist Stop Time (ACUTE ONLY) 0846  Mobility Specialist Time Calculation (min) (ACUTE ONLY) 9 min   Pt received in bed and agreeable. Required only minG throughout. Asymptomatic w/ no complaints throughout. Pt left on EOB with call bell and RN present.  [place]ante Earlene Plater Mobility Specialist Please contact via Special educational needs teacher or Rehab office at [phone]

## 2024-02-18 ENCOUNTER — Other Ambulatory Visit: Payer: Self-pay | Admitting: Physician Assistant

## 2024-02-18 DIAGNOSIS — M542 Cervicalgia: Secondary | ICD-10-CM | POA: Diagnosis not present

## 2024-02-18 DIAGNOSIS — G928 Other toxic encephalopathy: Secondary | ICD-10-CM | POA: Diagnosis not present

## 2024-02-18 DIAGNOSIS — G894 Chronic pain syndrome: Secondary | ICD-10-CM | POA: Diagnosis not present

## 2024-02-18 DIAGNOSIS — I739 Peripheral vascular disease, unspecified: Secondary | ICD-10-CM | POA: Diagnosis not present

## 2024-02-18 DIAGNOSIS — D649 Anemia, unspecified: Secondary | ICD-10-CM | POA: Diagnosis not present

## 2024-02-18 DIAGNOSIS — I10 Essential (primary) hypertension: Secondary | ICD-10-CM | POA: Diagnosis not present

## 2024-02-18 DIAGNOSIS — G4700 Insomnia, unspecified: Secondary | ICD-10-CM | POA: Diagnosis not present

## 2024-02-18 DIAGNOSIS — G8929 Other chronic pain: Secondary | ICD-10-CM | POA: Diagnosis not present

## 2024-02-18 DIAGNOSIS — G35 Multiple sclerosis: Secondary | ICD-10-CM | POA: Diagnosis not present

## 2024-02-18 DIAGNOSIS — G959 Disease of spinal cord, unspecified: Secondary | ICD-10-CM | POA: Diagnosis not present

## 2024-02-18 DIAGNOSIS — M1612 Unilateral primary osteoarthritis, left hip: Secondary | ICD-10-CM | POA: Diagnosis not present

## 2024-02-18 LAB — CULTURE, BLOOD (ROUTINE X 2)
Culture: NO GROWTH
Culture: NO GROWTH
Special Requests: ADEQUATE

## 2024-02-18 NOTE — Telephone Encounter (Signed)
 Becky Wolfe's husband called concerning the Klonopin refill. She was just discharged from the hospital and they changed her dosage. Windy Fast is concerned about the changes, and is requesting [employer]'s appointment moved up due to the recent discharge.

## 2024-02-19 ENCOUNTER — Other Ambulatory Visit (HOSPITAL_COMMUNITY): Payer: Self-pay

## 2024-02-19 ENCOUNTER — Encounter: Payer: Self-pay | Admitting: Physician Assistant

## 2024-02-19 ENCOUNTER — Ambulatory Visit: Admitting: Physician Assistant

## 2024-02-19 DIAGNOSIS — F22 Delusional disorders: Secondary | ICD-10-CM | POA: Diagnosis not present

## 2024-02-19 DIAGNOSIS — R443 Hallucinations, unspecified: Secondary | ICD-10-CM

## 2024-02-19 DIAGNOSIS — Z09 Encounter for follow-up examination after completed treatment for conditions other than malignant neoplasm: Secondary | ICD-10-CM | POA: Diagnosis not present

## 2024-02-19 DIAGNOSIS — F411 Generalized anxiety disorder: Secondary | ICD-10-CM

## 2024-02-19 MED ORDER — CLONAZEPAM 0.5 MG PO TABS: 0.5000 mg | ORAL_TABLET | Freq: Two times a day (BID) | ORAL | 1 refills | Status: DC | PRN

## 2024-02-19 MED ORDER — GABAPENTIN 400 MG PO CAPS
400.0000 mg | ORAL_CAPSULE | Freq: Four times a day (QID) | ORAL | 1 refills | Status: AC
Start: 2024-02-19 — End: ?

## 2024-02-19 NOTE — Telephone Encounter (Signed)
 LVM to RC on husband's #.
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
## 2024-02-19 NOTE — Patient Instructions (Addendum)
 Increase the Gabapentin 400 mg, to 1 pill 4 times a day. Continue Klonopin 0.5 mg, 1 pill twice a day as needed.  Continue Seroquel XR, 2 pills every night.  Continue Clonidine 0.1 mg, 1 twice a day. Continue Zoloft 100 mg, 2 daily.  Continue Hydroxyzine 10 mg, 1 twice a day as needed.

## 2024-02-23 DIAGNOSIS — I10 Essential (primary) hypertension: Secondary | ICD-10-CM | POA: Diagnosis not present

## 2024-02-23 DIAGNOSIS — G894 Chronic pain syndrome: Secondary | ICD-10-CM | POA: Diagnosis not present

## 2024-02-23 DIAGNOSIS — G959 Disease of spinal cord, unspecified: Secondary | ICD-10-CM | POA: Diagnosis not present

## 2024-02-23 DIAGNOSIS — G35 Multiple sclerosis: Secondary | ICD-10-CM | POA: Diagnosis not present

## 2024-02-26 ENCOUNTER — Other Ambulatory Visit: Payer: Self-pay | Admitting: Physician Assistant

## 2024-02-26 DIAGNOSIS — I10 Essential (primary) hypertension: Secondary | ICD-10-CM | POA: Diagnosis not present

## 2024-02-26 DIAGNOSIS — G35 Multiple sclerosis: Secondary | ICD-10-CM | POA: Diagnosis not present

## 2024-02-26 DIAGNOSIS — G959 Disease of spinal cord, unspecified: Secondary | ICD-10-CM | POA: Diagnosis not present

## 2024-02-26 DIAGNOSIS — G894 Chronic pain syndrome: Secondary | ICD-10-CM | POA: Diagnosis not present

## 2024-03-03 DIAGNOSIS — K7460 Unspecified cirrhosis of liver: Secondary | ICD-10-CM | POA: Diagnosis not present

## 2024-03-03 DIAGNOSIS — I2510 Atherosclerotic heart disease of native coronary artery without angina pectoris: Secondary | ICD-10-CM | POA: Diagnosis not present

## 2024-03-03 DIAGNOSIS — G928 Other toxic encephalopathy: Secondary | ICD-10-CM | POA: Diagnosis not present

## 2024-03-03 DIAGNOSIS — I679 Cerebrovascular disease, unspecified: Secondary | ICD-10-CM | POA: Diagnosis not present

## 2024-03-03 DIAGNOSIS — G9050 Complex regional pain syndrome I, unspecified: Secondary | ICD-10-CM | POA: Diagnosis not present

## 2024-03-03 DIAGNOSIS — I10 Essential (primary) hypertension: Secondary | ICD-10-CM | POA: Diagnosis not present

## 2024-03-03 DIAGNOSIS — R109 Unspecified abdominal pain: Secondary | ICD-10-CM | POA: Diagnosis not present

## 2024-03-09 ENCOUNTER — Other Ambulatory Visit: Payer: Self-pay | Admitting: Neurology

## 2024-03-09 NOTE — Telephone Encounter (Signed)
 Dr. Godwin Lat- I do not see mention of you refilling this for pt/continuing. Are you writing/refilling for pt?

## 2024-03-10 DIAGNOSIS — I10 Essential (primary) hypertension: Secondary | ICD-10-CM | POA: Diagnosis not present

## 2024-03-10 DIAGNOSIS — G35 Multiple sclerosis: Secondary | ICD-10-CM | POA: Diagnosis not present

## 2024-03-10 DIAGNOSIS — G959 Disease of spinal cord, unspecified: Secondary | ICD-10-CM | POA: Diagnosis not present

## 2024-03-10 DIAGNOSIS — G894 Chronic pain syndrome: Secondary | ICD-10-CM | POA: Diagnosis not present

## 2024-03-11 DIAGNOSIS — I739 Peripheral vascular disease, unspecified: Secondary | ICD-10-CM | POA: Diagnosis not present

## 2024-03-11 DIAGNOSIS — M1612 Unilateral primary osteoarthritis, left hip: Secondary | ICD-10-CM | POA: Diagnosis not present

## 2024-03-11 DIAGNOSIS — G35 Multiple sclerosis: Secondary | ICD-10-CM | POA: Diagnosis not present

## 2024-03-11 DIAGNOSIS — G928 Other toxic encephalopathy: Secondary | ICD-10-CM | POA: Diagnosis not present

## 2024-03-11 DIAGNOSIS — D649 Anemia, unspecified: Secondary | ICD-10-CM | POA: Diagnosis not present

## 2024-03-11 DIAGNOSIS — G4700 Insomnia, unspecified: Secondary | ICD-10-CM | POA: Diagnosis not present

## 2024-03-19 ENCOUNTER — Ambulatory Visit: Admitting: Physician Assistant

## 2024-03-19 ENCOUNTER — Telehealth: Payer: Self-pay | Admitting: Physician Assistant

## 2024-03-19 NOTE — Telephone Encounter (Addendum)
 Called husband back and pt answered his phone. She sounded sedated. Told her I would call back on Monday.

## 2024-03-19 NOTE — Telephone Encounter (Signed)
 Pt's husband called at 11:20a.  They missed appt today.  He called back to say the medication is not working.  He said the pt is hallucinating and telling him that there are people in the mattress. Pls call him back to discuss what changes to the medication he can make.  Next appt 5/29

## 2024-03-19 NOTE — Telephone Encounter (Signed)
 Husband called to report medications are not working, doesn't mention any particular medication. He reports patient is talking to the walls. She reported someone talking under the bed and he got a flashlight to show her no one under the bed and she said they must be in the mattress. Thinks someone is going to take the house, is moving the walls. She whispers to him and wants him to whisper to her because there are speakers in the house and people can hear them.   WG on [facility].

## 2024-03-19 NOTE — Telephone Encounter (Signed)
 Double check the Seroquel  dose she's taking.  There has been confusion on that recently.  She should be on 600 mg.  Increase to 800 mg.  Send in XL 400 mg, #60, 2 po at bedtime. No RF.  If he feels like she's a danger to herself or others, she may need to go to [facility] for eval and behavioral health commitment if appropriate. (I reviewed the hospital d/c from last month.)

## 2024-03-22 ENCOUNTER — Other Ambulatory Visit: Payer: Self-pay

## 2024-03-22 MED ORDER — QUETIAPINE FUMARATE ER 400 MG PO TB24: 800.0000 mg | ORAL_TABLET | Freq: Every day | ORAL | 0 refills | Status: DC

## 2024-03-22 NOTE — Telephone Encounter (Signed)
 Husband notified of recommendations. He reports patient has been taking 600 mg Seroquel . Rx sent to requested pharmacy for 800 mg.

## 2024-03-30 DIAGNOSIS — G90522 Complex regional pain syndrome I of left lower limb: Secondary | ICD-10-CM | POA: Diagnosis not present

## 2024-03-30 DIAGNOSIS — G894 Chronic pain syndrome: Secondary | ICD-10-CM | POA: Diagnosis not present

## 2024-03-30 DIAGNOSIS — Z79899 Other long term (current) drug therapy: Secondary | ICD-10-CM | POA: Diagnosis not present

## 2024-03-30 DIAGNOSIS — M5412 Radiculopathy, cervical region: Secondary | ICD-10-CM | POA: Diagnosis not present

## 2024-03-30 DIAGNOSIS — M961 Postlaminectomy syndrome, not elsewhere classified: Secondary | ICD-10-CM | POA: Diagnosis not present

## 2024-04-06 ENCOUNTER — Encounter: Admitting: Family

## 2024-04-06 NOTE — Progress Notes (Signed)
   This encounter was created in error - please disregard. No show

## 2024-04-08 ENCOUNTER — Ambulatory Visit: Admitting: Physician Assistant

## 2024-04-08 ENCOUNTER — Encounter: Payer: Self-pay | Admitting: Physician Assistant

## 2024-04-08 DIAGNOSIS — F4310 Post-traumatic stress disorder, unspecified: Secondary | ICD-10-CM

## 2024-04-08 DIAGNOSIS — F411 Generalized anxiety disorder: Secondary | ICD-10-CM | POA: Diagnosis not present

## 2024-04-08 DIAGNOSIS — F22 Delusional disorders: Secondary | ICD-10-CM

## 2024-04-08 DIAGNOSIS — R443 Hallucinations, unspecified: Secondary | ICD-10-CM

## 2024-04-08 NOTE — Progress Notes (Signed)
 [facility]  Patient ID: Becky Wolfe,  MRN: [id number]  PCP: Glena Landau, MD  Date of Evaluation: 04/08/2024 Time spent:30 minutes  Chief Complaint:  Chief Complaint   Hallucinations; Follow-up    HISTORY/CURRENT STATUS: For routine med check.  Husband is with her.   We increased Seroquel  XR to 800 mg on 03/22/2024, after her husband called on 03/19/2024 reporting continued hallucinations.  States they may be a little worse since increasing the dose, she is talking to these "people" that are in their house.  It does not matter the time of day, not worse at night.  She is taking the Seroquel , he gives it to her and make sure she takes it.  Even after taking it, there are nights that she will go outside and start pulling up weeds in the flower bed.  He has found her out there before.  Patient states he does not want her working in the flower bed so she does it during the night when he cannot tell her not to do it.  He is afraid that she will get bit by a snake or fall and not be able to get up or something to that effect.  Patient states the people are real coming into their house but no one believes her.  She is not as adamant about it as she has been in the past however and seems more calm.  No reports of energy with decreased need for sleep, increased talkativeness, or racing thoughts.  No increased irritability or anger except frustration of not being believed.  No symptoms of depression.  Appetite is normal.  Memory is unchanged.  Personal hygiene is normal.  She is paranoid that someone is trying to take their home.  Denies suicidal or homicidal thoughts.  Review of Systems  Constitutional: Negative.   HENT: Negative.    Eyes: Negative.   Respiratory: Negative.    Cardiovascular: Negative.   Gastrointestinal: Negative.   Genitourinary: Negative.   Musculoskeletal:        Chronic low back pain radiating to extremities.  Under the care of pain management  Skin:  Negative.   Neurological: Negative.   Endo/Heme/Allergies: Negative.   Psychiatric/Behavioral:         See HPI   Individual Medical History/ Review of Systems: Changes? :Yes Saw pain management 03/30/2024 for routine visit and renewal of meds.  Note reviewed.     Past medications for mental health diagnoses include: Klonopin , Zoloft , Paxil, Celexa, Ativan , Ambien  caused sleep talking, Lamictal  she preferred not to take.  Allergies: Etodolac, Ambien  [zolpidem  tartrate], and Namenda [memantine]  Current Medications:  Current Outpatient Medications:    acetaminophen  (TYLENOL ) 500 MG tablet, Take 2 tablets (1,000 mg total) by mouth every 6 (six) hours as needed for mild pain or moderate pain., Disp: 60 tablet, Rfl: 0   aspirin  81 MG chewable tablet, Chew by mouth daily., Disp: , Rfl:    buPROPion  (WELLBUTRIN  XL) 150 MG 24 hr tablet, TAKE 1 TABLET(150 MG) BY MOUTH DAILY, Disp: 90 tablet, Rfl: 1   clonazePAM  (KLONOPIN ) 0.5 MG tablet, Take 1 tablet (0.5 mg total) by mouth 2 (two) times daily as needed for anxiety., Disp: 60 tablet, Rfl: 1   cloNIDine  (CATAPRES ) 0.1 MG tablet, Take 1 tablet (0.1 mg total) by mouth 2 (two) times daily., Disp: , Rfl:    clopidogrel  (PLAVIX ) 75 MG tablet, TAKE 1 TABLET(75 MG) BY MOUTH DAILY, Disp: 90 tablet, Rfl: 0   fluconazole  (DIFLUCAN ) 150 MG  tablet, Take 1 tablet (150 mg total) by mouth once as needed for up to 1 dose (for yeast infection while on antibiotic)., Disp: 1 tablet, Rfl: 0   gabapentin  (NEURONTIN ) 400 MG capsule, Take 1 capsule (400 mg total) by mouth 4 (four) times daily., Disp: 120 capsule, Rfl: 1   methadone  (DOLOPHINE ) 5 MG tablet, Take 1 tablet (5 mg total) by mouth every 12 (twelve) hours. (Patient taking differently: Take 5 mg by mouth in the morning, at noon, and at bedtime.), Disp: 30 tablet, Rfl: 0   QUEtiapine  (SEROQUEL  XR) 400 MG 24 hr tablet, Take 2 tablets (800 mg total) by mouth at bedtime., Disp: 60 tablet, Rfl: 0   sertraline  (ZOLOFT )  100 MG tablet, TAKE 2 TABLETS(200 MG) BY MOUTH DAILY, Disp: 180 tablet, Rfl: 0 Medication Side Effects: none  Family Medical/ Social History: Changes? None  MENTAL HEALTH EXAM:  Last menstrual period 10/16/2011.There is no height or weight on file to calculate BMI.  General Appearance: Casual, Well Groomed, and looks tired  Eye Contact:  Fair  Speech:  Clear and Coherent and Slow  Volume:  Decreased  Mood:  Anxious  Affect:  Congruent  Thought Process:  Goal Directed and Descriptions of Associations: Circumstantial  Orientation:  Full (Time, Place, and Person)  Thought Content: Illogical, Hallucinations: Visual, Obsessions, and Paranoid Ideation   Suicidal Thoughts:  No  Homicidal Thoughts:  No  Memory:  Stable  Judgement:  Poor  Insight:  Lacking  Psychomotor Activity:  Normal  Concentration:  Concentration: Fair and Attention Span: Fair  Recall:  Fair  Fund of Knowledge: Good  Language: Good  Assets:  Desire for Improvement Financial Resources/Insurance Housing Transportation  ADL's:  Intact  Cognition: WNL  Prognosis:  Fair   GAD-7    Flowsheet Row Office Visit from 07/03/2020 in [facility]  Total GAD-7 Score 19      Mini-Mental    Flowsheet Row Office Visit from 04/18/2023 in [facility]  Total Score (max 30 points ) 30      PHQ2-9    Flowsheet Row Office Visit from 07/03/2020 in [facility]  PHQ-2 Total Score 4  PHQ-9 Total Score 12      Flowsheet Row ED to Hosp-Admission (Discharged) from 02/12/2024 in [facility] ED from 12/22/2023 in [facility] Emergency Department at [facility] ED from 09/16/2023 in [facility] Emergency Department at [facility]  C-SSRS RISK CATEGORY No Risk No Risk No Risk      DIAGNOSES:    ICD-10-CM   1. Paranoia (HCC)  F22     2. Hallucinations  R44.3     3. Generalized anxiety disorder  F41.1      4. PTSD (post-traumatic stress disorder)  F43.10       Receiving Psychotherapy: No    Froylan Jim, [facility] in the past.   RECOMMENDATIONS:  PDMP was reviewed. Last Klonopin  filled Klonopin  filled 03/18/2024.  Methadone  03/05/2024. I provided 30 minutes of face to face time during this encounter, including time spent before and after the visit in records review, medical decision making, counseling pertinent to today's visit, and charting.   Becky Wolfe seems calmer today and not irritated that no one believes her about people being inside their home.  It is hard to say just how effective the increased dose of Seroquel  is, it has only been 2 weeks on this dose.  There has been confusion  about the dose, initially her husband said she was taking 2 of the 200 mg but thankfully he brought her medications and I was able to confirm that she is taking 2 of the 400 mg pills since 03/22/2024.  She will stay on this dose at least for another 2 weeks, and if Becky Wolfe does not see any improvement then I will recommend changing to Zyprexa or Lybalvi.  He can call if he does not see improvement.  We briefly discussed using an antipsychotic long-acting injectable.  I do not think it is necessary at this point since Becky Wolfe knows for sure she is taking her medication.  Even if we go that route we will have to do an oral trial whichever agent we choose.  We discussed safety issues, use a bell or some other alarm on the door so that if she tries to go outside in the middle of the night her husband will hear her.  She verbalizes understanding concerning the dangers of being outside during the night, risk of falls leading to more serious injury, snakebite, etc. and states she will not do that anymore.  I again reviewed the discharge summary from hospitalization 02/12/2024 when she was found to have metabolic encephalopathy probably from a UTI.  She has a follow-up appointment with neurology, Dr. Godwin Lat 05/19/2024.  They will be leaving town  that day, I stressed the importance of them calling to reschedule as soon as possible.  Continue Wellbutrin  XL 150 mg, 1 q am. Continue Klonopin  0.5 mg, 1 po bid prn.  Continue clonidine  0.1 mg, 1 p.o. twice daily by another provider. Continue gabapentin  400 mg qid.  Continue Seroquel  XR  800 mg nightly, at 9 PM.  Continue Zoloft  100 mg, 2 po qd.  Strongly recommend counseling. Return in 4 weeks.  Marvia Slocumb, PA-C

## 2024-04-13 DIAGNOSIS — G9050 Complex regional pain syndrome I, unspecified: Secondary | ICD-10-CM | POA: Diagnosis not present

## 2024-04-13 DIAGNOSIS — I10 Essential (primary) hypertension: Secondary | ICD-10-CM | POA: Diagnosis not present

## 2024-04-13 DIAGNOSIS — F17210 Nicotine dependence, cigarettes, uncomplicated: Secondary | ICD-10-CM | POA: Diagnosis not present

## 2024-04-13 DIAGNOSIS — I2510 Atherosclerotic heart disease of native coronary artery without angina pectoris: Secondary | ICD-10-CM | POA: Diagnosis not present

## 2024-04-13 DIAGNOSIS — Z0000 Encounter for general adult medical examination without abnormal findings: Secondary | ICD-10-CM | POA: Diagnosis not present

## 2024-04-13 DIAGNOSIS — Z131 Encounter for screening for diabetes mellitus: Secondary | ICD-10-CM | POA: Diagnosis not present

## 2024-04-13 DIAGNOSIS — J432 Centrilobular emphysema: Secondary | ICD-10-CM | POA: Diagnosis not present

## 2024-04-13 DIAGNOSIS — K7460 Unspecified cirrhosis of liver: Secondary | ICD-10-CM | POA: Diagnosis not present

## 2024-04-13 LAB — LAB REPORT - SCANNED
A1c: 5.7
EGFR: 78

## 2024-04-15 ENCOUNTER — Other Ambulatory Visit: Payer: Self-pay | Admitting: Physician Assistant

## 2024-04-15 ENCOUNTER — Telehealth: Payer: Self-pay | Admitting: Neurology

## 2024-04-15 NOTE — Telephone Encounter (Signed)
 Spouse Request to be added to wait list

## 2024-04-22 ENCOUNTER — Telehealth: Payer: Self-pay | Admitting: Physician Assistant

## 2024-04-22 NOTE — Telephone Encounter (Signed)
 LVM to [facility]

## 2024-04-22 NOTE — Telephone Encounter (Signed)
 Pt's husband LVM @ 12:39p.  He is on DPR.  He said the pt is wanting something called in to calm her down.  Next appt 7/1

## 2024-04-23 MED ORDER — OLANZAPINE 5 MG PO TABS: 5.0000 mg | ORAL_TABLET | Freq: Every day | ORAL | 0 refills | Status: DC
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
## 2024-04-23 NOTE — Telephone Encounter (Signed)
 Sent Zyprexa 5 mg Rx to pharmacy. Also asked pharmacy to fill the gabapentin  400 mg Rx. Notified husband of both and the need to bring ALL medications to next appt.

## 2024-04-23 NOTE — Telephone Encounter (Signed)
 Lets add Zyprexa 5 mg at bedtime. For the record,  I know she's already on an AP, but it's not effective, I will address weaning off at the OV on 05/11/2024. Right now, she needs another one with hopefully rapid onset of action.  Please remind her husband to bring ALL meds at the visit.  There has been confusion concerning meds/doses in the past so I need to see them.  I agree, may need hospitalization again. Go to [facility] or ER if combative, increased danger to herself or others.  Thanks

## 2024-05-04 ENCOUNTER — Emergency Department (HOSPITAL_COMMUNITY)
Admission: EM | Admit: 2024-05-04 | Discharge: 2024-05-04 | Discharge status: Against Medical Advice | Attending: Emergency Medicine | Admitting: Emergency Medicine

## 2024-05-04 ENCOUNTER — Other Ambulatory Visit: Payer: Self-pay

## 2024-05-04 DIAGNOSIS — Z7982 Long term (current) use of aspirin: Secondary | ICD-10-CM | POA: Insufficient documentation

## 2024-05-04 DIAGNOSIS — R82998 Other abnormal findings in urine: Secondary | ICD-10-CM | POA: Diagnosis not present

## 2024-05-04 DIAGNOSIS — Z7902 Long term (current) use of antithrombotics/antiplatelets: Secondary | ICD-10-CM | POA: Insufficient documentation

## 2024-05-04 DIAGNOSIS — Z113 Encounter for screening for infections with a predominantly sexual mode of transmission: Secondary | ICD-10-CM | POA: Insufficient documentation

## 2024-05-04 DIAGNOSIS — Z5329 Procedure and treatment not carried out because of patient's decision for other reasons: Secondary | ICD-10-CM | POA: Diagnosis not present

## 2024-05-04 DIAGNOSIS — T7421XA Adult sexual abuse, confirmed, initial encounter: Secondary | ICD-10-CM | POA: Insufficient documentation

## 2024-05-04 LAB — URINALYSIS, ROUTINE W REFLEX MICROSCOPIC
Bacteria, UA: NONE SEEN
Bilirubin Urine: NEGATIVE
Glucose, UA: NEGATIVE mg/dL
Hgb urine dipstick: NEGATIVE
Ketones, ur: NEGATIVE mg/dL
Nitrite: NEGATIVE
Protein, ur: NEGATIVE mg/dL
Specific Gravity, Urine: 1.019 (ref 1.005–1.030)
pH: 6 (ref 5.0–8.0)
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
## 2024-05-04 NOTE — ED Notes (Signed)
 Pt stating that she can't stay any longer, she is too tired and will come back tomorrow. Pt refusing labs and any testing at this time.
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
## 2024-05-05 LAB — URINE CULTURE: Culture: <10000 — AB

## 2024-05-05 LAB — GC/CHLAMYDIA PROBE AMP (~~LOC~~) NOT AT ARMC
Chlamydia: NEGATIVE
Comment: NEGATIVE
Comment: NORMAL
Neisseria Gonorrhea: NEGATIVE

## 2024-05-10 ENCOUNTER — Telehealth: Payer: Self-pay | Admitting: Neurology

## 2024-05-10 NOTE — Telephone Encounter (Signed)
 Pt husband came in to reschedule appt. States he wants Dr. Vear to know that he wants his wife checked for Alzheimer's or dementia and something is not right in her mind.

## 2024-05-10 NOTE — Telephone Encounter (Signed)
 Pt last seen 06/03/23. Next appt 05/28/24 (scheduled today by Valery).  Previously scheduled 05/19/24

## 2024-05-11 ENCOUNTER — Encounter: Payer: Self-pay | Admitting: Physician Assistant

## 2024-05-11 ENCOUNTER — Ambulatory Visit: Admitting: Physician Assistant

## 2024-05-11 DIAGNOSIS — F3341 Major depressive disorder, recurrent, in partial remission: Secondary | ICD-10-CM

## 2024-05-11 DIAGNOSIS — F22 Delusional disorders: Secondary | ICD-10-CM

## 2024-05-11 DIAGNOSIS — F411 Generalized anxiety disorder: Secondary | ICD-10-CM | POA: Diagnosis not present

## 2024-05-11 DIAGNOSIS — H60312 Diffuse otitis externa, left ear: Secondary | ICD-10-CM | POA: Diagnosis not present

## 2024-05-11 DIAGNOSIS — H66002 Acute suppurative otitis media without spontaneous rupture of ear drum, left ear: Secondary | ICD-10-CM | POA: Diagnosis not present

## 2024-05-11 DIAGNOSIS — R443 Hallucinations, unspecified: Secondary | ICD-10-CM

## 2024-05-11 MED ORDER — OLANZAPINE 10 MG PO TABS: 10.0000 mg | ORAL_TABLET | Freq: Every day | ORAL | 1 refills | Status: DC

## 2024-05-11 MED ORDER — CLONAZEPAM 0.5 MG PO TABS: 0.5000 mg | ORAL_TABLET | Freq: Two times a day (BID) | ORAL | 1 refills | Status: DC | PRN

## 2024-05-11 MED ORDER — BUPROPION HCL ER (XL) 150 MG PO TB24: 150.0000 mg | ORAL_TABLET | Freq: Every day | ORAL | 1 refills | Status: DC

## 2024-05-11 MED ORDER — SERTRALINE HCL 100 MG PO TABS: ORAL_TABLET | ORAL | 1 refills | Status: DC

## 2024-05-11 MED ORDER — QUETIAPINE FUMARATE ER 400 MG PO TB24: 800.0000 mg | ORAL_TABLET | Freq: Every day | ORAL | 1 refills | Status: DC
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
## 2024-05-12 ENCOUNTER — Encounter: Payer: Self-pay | Admitting: Physician Assistant

## 2024-05-12 ENCOUNTER — Other Ambulatory Visit: Payer: Self-pay | Admitting: Physician Assistant

## 2024-05-12 ENCOUNTER — Other Ambulatory Visit: Payer: Self-pay | Admitting: Neurology

## 2024-05-12 NOTE — Telephone Encounter (Signed)
 Last seen on 06/03/23 Follow up scheduled on 05/28/24   Do you want patient to follow up with PCP, I didn't see you wanted patient to continue Rx?

## 2024-05-18 ENCOUNTER — Telehealth: Payer: Self-pay | Admitting: Physician Assistant

## 2024-05-18 NOTE — Telephone Encounter (Signed)
 We do not prescribe methadone . Notified husband.

## 2024-05-18 NOTE — Telephone Encounter (Signed)
 Next visit is 06/09/24.Ron Lawson, Gailyn's spouse is listed on [place]. Her Methadone  has not been called in at [facility] in [place], [place]. They are going out of town and he needs to pick her script up today please.

## 2024-05-19 ENCOUNTER — Ambulatory Visit: Payer: Medicare Other | Admitting: Neurology

## 2024-05-28 ENCOUNTER — Ambulatory Visit: Admitting: Neurology

## 2024-05-28 ENCOUNTER — Encounter: Payer: Self-pay | Admitting: Neurology

## 2024-05-28 VITALS — BP 121/78 | HR 78 | Ht 60.0 in | Wt 150.0 lb

## 2024-05-28 DIAGNOSIS — R3911 Hesitancy of micturition: Secondary | ICD-10-CM

## 2024-05-28 DIAGNOSIS — R443 Hallucinations, unspecified: Secondary | ICD-10-CM | POA: Diagnosis not present

## 2024-05-28 DIAGNOSIS — R270 Ataxia, unspecified: Secondary | ICD-10-CM | POA: Diagnosis not present

## 2024-05-28 DIAGNOSIS — Z981 Arthrodesis status: Secondary | ICD-10-CM | POA: Diagnosis not present

## 2024-05-28 DIAGNOSIS — R296 Repeated falls: Secondary | ICD-10-CM

## 2024-05-28 DIAGNOSIS — F22 Delusional disorders: Secondary | ICD-10-CM

## 2024-05-28 DIAGNOSIS — R269 Unspecified abnormalities of gait and mobility: Secondary | ICD-10-CM
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
## 2024-05-31 ENCOUNTER — Telehealth: Payer: Self-pay | Admitting: Neurology

## 2024-05-31 ENCOUNTER — Other Ambulatory Visit: Payer: Self-pay | Admitting: Physician Assistant

## 2024-05-31 NOTE — Telephone Encounter (Signed)
 Referral for neurology fax to [facility]. Phone: [phone], Fax: [phone]

## 2024-05-31 NOTE — Telephone Encounter (Signed)
No auth required sent to GI [phone]

## 2024-06-01 ENCOUNTER — Inpatient Hospital Stay
Admission: RE | Admit: 2024-06-01 | Discharge: 2024-06-01 | Discharge status: Home / Self Care | Source: Ambulatory Visit | Attending: Neurology | Admitting: Neurology

## 2024-06-01 DIAGNOSIS — Z5181 Encounter for therapeutic drug level monitoring: Secondary | ICD-10-CM | POA: Diagnosis not present

## 2024-06-01 DIAGNOSIS — R270 Ataxia, unspecified: Secondary | ICD-10-CM

## 2024-06-01 DIAGNOSIS — M5412 Radiculopathy, cervical region: Secondary | ICD-10-CM | POA: Diagnosis not present

## 2024-06-01 DIAGNOSIS — Z79899 Other long term (current) drug therapy: Secondary | ICD-10-CM | POA: Diagnosis not present

## 2024-06-01 DIAGNOSIS — G90522 Complex regional pain syndrome I of left lower limb: Secondary | ICD-10-CM | POA: Diagnosis not present

## 2024-06-01 DIAGNOSIS — M961 Postlaminectomy syndrome, not elsewhere classified: Secondary | ICD-10-CM | POA: Diagnosis not present

## 2024-06-01 DIAGNOSIS — S0990XA Unspecified injury of head, initial encounter: Secondary | ICD-10-CM | POA: Diagnosis not present

## 2024-06-02 ENCOUNTER — Other Ambulatory Visit: Payer: Self-pay | Admitting: Nurse Practitioner

## 2024-06-02 ENCOUNTER — Ambulatory Visit: Payer: Self-pay | Admitting: Neurology

## 2024-06-02 DIAGNOSIS — I6789 Other cerebrovascular disease: Secondary | ICD-10-CM | POA: Diagnosis not present

## 2024-06-02 DIAGNOSIS — I2510 Atherosclerotic heart disease of native coronary artery without angina pectoris: Secondary | ICD-10-CM | POA: Diagnosis not present

## 2024-06-02 DIAGNOSIS — K7469 Other cirrhosis of liver: Secondary | ICD-10-CM | POA: Diagnosis not present

## 2024-06-02 DIAGNOSIS — G9050 Complex regional pain syndrome I, unspecified: Secondary | ICD-10-CM | POA: Diagnosis not present

## 2024-06-02 DIAGNOSIS — Z720 Tobacco use: Secondary | ICD-10-CM | POA: Diagnosis not present

## 2024-06-02 DIAGNOSIS — K7460 Unspecified cirrhosis of liver: Secondary | ICD-10-CM | POA: Diagnosis not present

## 2024-06-02 NOTE — Telephone Encounter (Signed)
-----   Message from [name] sent at 06/02/2024  8:31 AM EDT ----- Please let them know that the CT scan looks good.  There were no hemorrhages from her fall.  It looks the same as the previous study ----- Message ----- From: Crete Charlie DELENA, MD Sent: 06/01/2024   4:45 PM EDT To: [name], MD

## 2024-06-02 NOTE — Telephone Encounter (Signed)
 Called husband at [phone]. Relayed results per Dr. Duncan note. He verbalized understanding.

## 2024-06-09 ENCOUNTER — Encounter: Payer: Self-pay | Admitting: Physician Assistant

## 2024-06-09 ENCOUNTER — Ambulatory Visit (INDEPENDENT_AMBULATORY_CARE_PROVIDER_SITE_OTHER): Admitting: Physician Assistant

## 2024-06-09 DIAGNOSIS — F22 Delusional disorders: Secondary | ICD-10-CM

## 2024-06-09 DIAGNOSIS — R443 Hallucinations, unspecified: Secondary | ICD-10-CM

## 2024-06-09 DIAGNOSIS — F411 Generalized anxiety disorder: Secondary | ICD-10-CM

## 2024-06-09 MED ORDER — OLANZAPINE 15 MG PO TABS: 15.0000 mg | ORAL_TABLET | Freq: Every day | ORAL | 0 refills | Status: DC

## 2024-06-09 MED ORDER — OLANZAPINE 20 MG PO TABS: 20.0000 mg | ORAL_TABLET | Freq: Every day | ORAL | 1 refills | Status: DC

## 2024-06-09 NOTE — Progress Notes (Signed)
 [facility]  Patient ID: Becky Wolfe,  MRN: [id number]  PCP: Loreli Kins, MD  Date of Evaluation: 06/09/2024 Time spent:25 minutes  Chief Complaint:  Chief Complaint   Depression; Drug / Alcohol  Assessment; Follow-up    HISTORY/CURRENT STATUS: For 1 month med check.  Husband accompanies her.   Still talking to 'the people who are coming in the house.' Her husband thinks it might be even worse now than it was. She reports the people are still there, she can hear them, they were opening/unzipping her bags to see what was in there. She went outside after dark last night to take her dog out. She sat on the porch awhile. She denies working in the flower beds as night, but her husband says he doesn't know. She feels more at peace. Energy and motivation are fair.  She hasn't been taking the Wellbutrin , 'thought it was as needed.' Hasn't felt like she needed it. Her husband says she needs more energy and motivation.  No sadness, tearfulness, or feelings of hopelessness.  Sleeps ok.  ADLs and personal hygiene are normal.   Appetite has not changed.  Weight is stable.  No SI/HI.  Gets anxious and overwhelmed a lot. Not having fullblown PA though.   Patient denies increased energy with decreased need for sleep, increased talkativeness, racing thoughts, impulsivity or risky behaviors, increased spending, grandiosity.  She gets irritable easily.   Review of Systems  Constitutional: Negative.   HENT: Negative.    Eyes: Negative.   Respiratory: Negative.    Cardiovascular: Negative.   Gastrointestinal: Negative.   Genitourinary: Negative.   Musculoskeletal: Negative.   Skin: Negative.   Neurological: Negative.   Endo/Heme/Allergies: Negative.   Psychiatric/Behavioral:         See HPI   Individual Medical History/ Review of Systems: Changes? :No   Past medications for mental health diagnoses include: Klonopin , Zoloft , Paxil, Celexa, Ativan , Ambien  caused sleep talking,  Lamictal  she preferred not to take.  Allergies: Etodolac, Ambien  [zolpidem  tartrate], and Namenda [memantine]  Current Medications:  Current Outpatient Medications:    aspirin  81 MG chewable tablet, Chew by mouth daily., Disp: , Rfl:    buPROPion  (WELLBUTRIN  XL) 150 MG 24 hr tablet, Take 150 mg by mouth daily., Disp: , Rfl:    clonazePAM  (KLONOPIN ) 0.5 MG tablet, Take 1 tablet (0.5 mg total) by mouth 2 (two) times daily as needed for anxiety., Disp: 60 tablet, Rfl: 1   cloNIDine  (CATAPRES ) 0.1 MG tablet, Take 1 tablet (0.1 mg total) by mouth 2 (two) times daily., Disp: , Rfl:    clopidogrel  (PLAVIX ) 75 MG tablet, TAKE 1 TABLET(75 MG) BY MOUTH DAILY, Disp: 90 tablet, Rfl: 0   gabapentin  (NEURONTIN ) 400 MG capsule, Take 1 capsule (400 mg total) by mouth 4 (four) times daily., Disp: 120 capsule, Rfl: 1   OLANZapine  (ZYPREXA ) 15 MG tablet, Take 1 tablet (15 mg total) by mouth at bedtime., Disp: 7 tablet, Rfl: 0   OLANZapine  (ZYPREXA ) 20 MG tablet, Take 1 tablet (20 mg total) by mouth at bedtime. Start after the 15 mg for 1 week is complete, Disp: 30 tablet, Rfl: 1   QUEtiapine  (SEROQUEL  XR) 400 MG 24 hr tablet, Take 2 tablets (800 mg total) by mouth at bedtime., Disp: 60 tablet, Rfl: 1   sertraline  (ZOLOFT ) 100 MG tablet, TAKE 2 TABLETS(200 MG) BY MOUTH DAILY, Disp: 180 tablet, Rfl: 1   methadone  (DOLOPHINE ) 5 MG tablet, Take 1 tablet (5 mg total) by mouth every 12 (  twelve) hours. (Patient taking differently: Take 5 mg by mouth in the morning, at noon, and at bedtime.), Disp: 30 tablet, Rfl: 0 Medication Side Effects: none  Family Medical/ Social History: Changes? None  MENTAL HEALTH EXAM:  Last menstrual period 10/16/2011.There is no height or weight on file to calculate BMI.  General Appearance: Casual and Well Groomed  Eye Contact:  Fair  Speech:  Clear and Coherent and Slow  Volume:  Decreased  Mood:  Euthymic  Affect:  Congruent  Thought Process:  Goal Directed and Descriptions of  Associations: Circumstantial  Orientation:  Full (Time, Place, and Person)  Thought Content: Illogical, Hallucinations: Visual, Obsessions, and Paranoid Ideation   Suicidal Thoughts:  No  Homicidal Thoughts:  No  Memory:  Stable  Judgement:  Poor  Insight:  Lacking  Psychomotor Activity:  Normal  Concentration:  Concentration: Fair and Attention Span: Fair  Recall:  Fair  Fund of Knowledge: Good  Language: Good  Assets:  Desire for Improvement Financial Resources/Insurance Housing Transportation  ADL's:  Intact  Cognition: WNL  Prognosis:  Fair    GAD-7    Flowsheet Row [facility] Visit from 07/03/2020 in [facility]  Total GAD-7 Score 19   Mini-Mental    Flowsheet Row Office Visit from 04/18/2023 in [facility]  Total Score (max 30 points ) 30   PHQ2-9    Flowsheet Row Office Visit from 07/03/2020 in [facility]  PHQ-2 Total Score 4  PHQ-9 Total Score 12   Flowsheet Row ED from 05/04/2024 in [facility] Emergency Department at [facility] ED to Hosp-Admission (Discharged) from 02/12/2024 in [facility] 67M KIDNEY UNIT ED from 12/22/2023 in [facility] Emergency Department at [facility]  C-SSRS RISK CATEGORY No Risk No Risk No Risk   DIAGNOSES:    ICD-10-CM   1. Paranoia (HCC)  F22     2. Hallucinations  R44.3     3. Generalized anxiety disorder  F41.1      Receiving Psychotherapy: No    Wilburn Shaver, [facility] in the past.   RECOMMENDATIONS:  PDMP was reviewed.  Gabapentin  filled 06/05/2024.  Oxycodone  filled 06/03/2024.  Klonopin  filled 05/16/2024. I provided approximately  25  minutes of face to face time during this encounter, including time spent before and after the visit in records review, medical decision making, counseling pertinent to today's visit, and charting.   She doesn't seem as agitated today as she has in the past. I think the Zyprexa  is effective  but recommend increasing the dose. Hopefully we can get her off Seroquel  after the Zyprexa  is in full effect.  Continue Klonopin  0.5 mg, 1 po bid prn.  Continue clonidine  0.1 mg, 1 p.o. twice daily by another provider. Continue gabapentin  400 mg qid.  Increase Zyprexa  to 15 mg for 1 week, then 20 mg at bedtime.  Continue Seroquel  XR  800 mg nightly, at 9 PM.  Continue Zoloft  100 mg, 2 po qd.  Strongly recommend counseling. Return in 6-8 weeks.  Verneita Cooks, PA-C

## 2024-06-10 ENCOUNTER — Telehealth: Payer: Self-pay | Admitting: Physician Assistant

## 2024-06-10 NOTE — Telephone Encounter (Signed)
 Next visit is 07/23/24. Becky Wolfe is listed on her patient contact. Becky Wolfe states that she goes outside in the dark and sometimes she doesn't take her medicines. Ronald's phone number is [phone]. Please call her at [phone].

## 2024-06-10 NOTE — Telephone Encounter (Signed)
 Husband called to report pt going outside at night. He reported today she was looking for a ladder in the garage, wanted to go up in the attic to look for the people/voices. He reported she didn't take her medication this morning. I asked him if he had them in a pill box or if he gave them to her. He reported he puts them in a bottle and gives her the bottle each day. He reports that at lunch she had not taken.

## 2024-06-11 NOTE — Telephone Encounter (Signed)
 He is going to have to give her medicine to her and watch her take them.  I want him, or someone, overseeing ALL her meds, dispense them and make sure she's taking them as directed.  She's on a lot of meds, several that cause sedation and can cause confusion, and over, or under, taking them, can be a huge problem.  Remind him she may have to be admitted again if she's a danger to herself and take her to [facility] if needed.

## 2024-06-11 NOTE — Telephone Encounter (Signed)
 Reviewed all recommendations with husband.

## 2024-06-14 ENCOUNTER — Other Ambulatory Visit: Payer: Self-pay | Admitting: Physician Assistant

## 2024-06-16 ENCOUNTER — Other Ambulatory Visit: Payer: Self-pay | Admitting: Physician Assistant

## 2024-06-21 ENCOUNTER — Telehealth: Payer: Self-pay | Admitting: Neurology

## 2024-06-21 NOTE — Telephone Encounter (Signed)
 Ann called from [facility] to inform MD that Pt has been stating she is having really bad Hallucination . Jenkins also stated that Pt keep saying she is seeing People  that are not there. Pt husband has stated that Pt has been very paranoid and is not sure what to do . Jenkins was trying  to se if MD could do another evaluation or can check on Pt medication .  Ann callback # [phone]

## 2024-06-21 NOTE — Telephone Encounter (Signed)
 Dr. Vear- what would you like to do?  Looks like you put in referral to [facility] for second opinion (see below). She also follows behavioral health, had visit 06/09/24 (would have to break the glass to see notes)  [facility] at [facility]    Progressive cognitive, behavioral and gait disorder  (Movement disorder or cognitive neurology) Schedule through spouse  [phone] Isadora Kama)

## 2024-06-22 ENCOUNTER — Telehealth: Payer: Self-pay | Admitting: Physician Assistant

## 2024-06-22 MED ORDER — HYDROXYZINE HCL 25 MG PO TABS: 25.0000 mg | ORAL_TABLET | Freq: Three times a day (TID) | ORAL | 3 refills | Status: DC | PRN

## 2024-06-22 NOTE — Telephone Encounter (Signed)
 Called Therisa back at [phone]. Relayed Dr. Duncan message. She will reach out to pt Behavioral health doctor. Will let husband know we are calling in hydroxyzine  25mg  to take 1 po TID prn for agitation to:  Pharmacy: [facility] #[id number] - [place], [place] - [address] AT [facility] OF [address]   Instructed for husband to call back if any further questions arise. She verbalized understanding.

## 2024-06-22 NOTE — Telephone Encounter (Signed)
 I reviewed the phone notes w/ Dr. Vear and the Hydroxyzine . This is a good idea to help with the agitation. Continue the Zyprexa  and Seroquel  as directed (for the record, I hope to get her off Seroquel  if possible, once the Zyprexa  kicks in.) Remind her husband it might take another few weeks for the Zyprexa  to get to maximum benefit.  Dr. Vear has a referral in at [facility] for a second opinion, so I don't recommend any other changes at this time.  Go to [facility] if she's a danger to herself or others.

## 2024-06-22 NOTE — Telephone Encounter (Signed)
 [facility] called to say that the pt has seen her neurologist Dr Vear and he prescribed Hydroxyzine  for her, but that the pt continues to hallucinate and have paranoia.  Her husband says it's getting worse in the last 2 wks.  Next visit 9/12

## 2024-06-22 NOTE — Telephone Encounter (Signed)
 Dr. Vear just prescribed the hydroxyzine  today, have not picked up from the pharmacy. Husband reports he has been making sure pt is taking meds since 8/1. The reported sx wax and wane periodically.

## 2024-06-23 NOTE — Telephone Encounter (Signed)
 Husband notified of recommendations.

## 2024-07-02 ENCOUNTER — Ambulatory Visit
Admission: RE | Admit: 2024-07-02 | Discharge: 2024-07-02 | Disposition: A | Discharge status: Home / Self Care | Source: Ambulatory Visit | Attending: Nurse Practitioner | Admitting: Nurse Practitioner

## 2024-07-02 DIAGNOSIS — K7469 Other cirrhosis of liver: Secondary | ICD-10-CM

## 2024-07-02 DIAGNOSIS — K7460 Unspecified cirrhosis of liver: Secondary | ICD-10-CM | POA: Diagnosis not present

## 2024-07-13 ENCOUNTER — Other Ambulatory Visit: Payer: Self-pay | Admitting: Physician Assistant

## 2024-07-15 ENCOUNTER — Other Ambulatory Visit: Payer: Self-pay | Admitting: Physician Assistant

## 2024-07-15 DIAGNOSIS — F411 Generalized anxiety disorder: Secondary | ICD-10-CM

## 2024-07-20 DIAGNOSIS — G90522 Complex regional pain syndrome I of left lower limb: Secondary | ICD-10-CM | POA: Diagnosis not present

## 2024-07-20 DIAGNOSIS — M961 Postlaminectomy syndrome, not elsewhere classified: Secondary | ICD-10-CM | POA: Diagnosis not present

## 2024-07-20 DIAGNOSIS — Z79899 Other long term (current) drug therapy: Secondary | ICD-10-CM | POA: Diagnosis not present

## 2024-07-20 DIAGNOSIS — M5412 Radiculopathy, cervical region: Secondary | ICD-10-CM | POA: Diagnosis not present

## 2024-07-20 DIAGNOSIS — G894 Chronic pain syndrome: Secondary | ICD-10-CM | POA: Diagnosis not present

## 2024-07-23 ENCOUNTER — Ambulatory Visit (INDEPENDENT_AMBULATORY_CARE_PROVIDER_SITE_OTHER): Admitting: Physician Assistant

## 2024-07-23 ENCOUNTER — Encounter: Payer: Self-pay | Admitting: Physician Assistant

## 2024-07-23 DIAGNOSIS — R443 Hallucinations, unspecified: Secondary | ICD-10-CM

## 2024-07-23 DIAGNOSIS — F22 Delusional disorders: Secondary | ICD-10-CM

## 2024-07-23 NOTE — Progress Notes (Unsigned)
 [facility]  Patient ID: [place] JON,  MRN: [id number]  PCP: Loreli Kins, MD  Date of Evaluation: 07/23/2024 Time spent:30 minutes  Chief Complaint:  Chief Complaint   Paranoid    HISTORY/CURRENT STATUS: Accompanied by her husband.   Zyprexa  was increased about a week ago.  Still thinks someone is coming into her home, moving things around. States people are calling out to her sometimes, no VH. Not going outside in the dark like she has in the past.  She takes the Klonopin  but not overtaking.  Lyndy gives her all her meds as prescribed.  He thinks she might be a little better.  Dr. Sater gave her hydroxyzine  a few weeks ago for agitation. She's taken it a few times, unsure how much it helps. No reports of increased energy with decreased need for sleep, increased talkativeness, racing thoughts, increased spending, grandiosity, increased irritability or anger.  No reports of sadness, tearfulness, or feelings of hopelessness.  Sleeps ok. ADLs and personal hygiene are normal.   Appetite has not changed.  Weight is stable.  No SI/HI.  Individual Medical History/ Review of Systems: Changes? :No   Past medications for mental health diagnoses include: Klonopin , Zoloft , Paxil, Celexa, Ativan , Ambien  caused sleep talking, Lamictal  she preferred not to take.  Allergies: Etodolac, Ambien  [zolpidem  tartrate], and Namenda [memantine]  Current Medications:  Current Outpatient Medications:    aspirin  81 MG chewable tablet, Chew by mouth daily., Disp: , Rfl:    buPROPion  (WELLBUTRIN  XL) 150 MG 24 hr tablet, Take 150 mg by mouth daily., Disp: , Rfl:    clonazePAM  (KLONOPIN ) 0.5 MG tablet, TAKE 1 TABLET(0.5 MG) BY MOUTH TWICE DAILY AS NEEDED FOR ANXIETY, Disp: 60 tablet, Rfl: 0   cloNIDine  (CATAPRES ) 0.1 MG tablet, Take 1 tablet (0.1 mg total) by mouth 2 (two) times daily., Disp: , Rfl:    clopidogrel  (PLAVIX ) 75 MG tablet, TAKE 1 TABLET(75 MG) BY MOUTH DAILY, Disp: 90 tablet, Rfl:  0   gabapentin  (NEURONTIN ) 400 MG capsule, Take 1 capsule (400 mg total) by mouth 4 (four) times daily., Disp: 120 capsule, Rfl: 1   hydrOXYzine  (ATARAX ) 25 MG tablet, Take 1 tablet (25 mg total) by mouth 3 (three) times daily as needed., Disp: 90 tablet, Rfl: 3   OLANZapine  (ZYPREXA ) 20 MG tablet, Take 1 tablet (20 mg total) by mouth at bedtime. Start after the 15 mg for 1 week is complete, Disp: 30 tablet, Rfl: 1   QUEtiapine  (SEROQUEL  XR) 400 MG 24 hr tablet, Take 2 tablets (800 mg total) by mouth at bedtime., Disp: 60 tablet, Rfl: 1   sertraline  (ZOLOFT ) 100 MG tablet, TAKE 2 TABLETS(200 MG) BY MOUTH DAILY, Disp: 180 tablet, Rfl: 1   methadone  (DOLOPHINE ) 5 MG tablet, Take 1 tablet (5 mg total) by mouth every 12 (twelve) hours. (Patient taking differently: Take 5 mg by mouth in the morning, at noon, and at bedtime.), Disp: 30 tablet, Rfl: 0 Medication Side Effects: none  Family Medical/ Social History: Changes? None  MENTAL HEALTH EXAM:  Last menstrual period 10/16/2011.There is no height or weight on file to calculate BMI.  General Appearance: Casual and Well Groomed  Eye Contact:  Fair  Speech:  Clear and Coherent and Slow  Volume:  Decreased  Mood:  Euthymic  Affect:  Flat  Thought Process:  Goal Directed and Descriptions of Associations: Circumstantial  Orientation:  Full (Time, Place, and Person)  Thought Content: Illogical, Hallucinations: Visual, Obsessions, and Paranoid Ideation   Suicidal  Thoughts:  No  Homicidal Thoughts:  No  Memory:  Stable  Judgement:  Poor  Insight:  Lacking  Psychomotor Activity:  forward leaning when walking. Straightens up when asked to do so.   Concentration:  Concentration: Fair and Attention Span: Fair  Recall:  Fair  Fund of Knowledge: Good  Language: Good  Assets:  Desire for Improvement Financial Resources/Insurance Housing Transportation  ADL's:  Intact  Cognition: WNL  Prognosis:  Fair    GAD-7    Flowsheet Row Office Visit  from 07/03/2020 in [facility]  Total GAD-7 Score 19   Mini-Mental    Flowsheet Row Office Visit from 04/18/2023 in [facility]  Total Score (max 30 points ) 30   PHQ2-9    Flowsheet Row Office Visit from 07/03/2020 in [facility]  PHQ-2 Total Score 4  PHQ-9 Total Score 12   Flowsheet Row ED from 05/04/2024 in [facility] Emergency Department at [facility] ED to Hosp-Admission (Discharged) from 02/12/2024 in [facility] 21M KIDNEY UNIT ED from 12/22/2023 in [facility] Emergency Department at [facility]  C-SSRS RISK CATEGORY No Risk No Risk No Risk   DIAGNOSES:    ICD-10-CM   1. Paranoia (HCC)  F22     2. Hallucinations  R44.3       Receiving Psychotherapy: No    Wilburn Shaver, [facility] in the past.   RECOMMENDATIONS:  PDMP was reviewed.  Gabapentin  filled 07/17/2024.  Oxycodone  filled 07/21/2024.  Klonopin  filled 07/18/2024. I provided approximately 30  minutes of face to face time during this encounter, including time spent before and after the visit in records review, medical decision making, counseling pertinent to today's visit, and charting.   Long discussion about her sx.  The AH may have improved slightly since the increased Zyprexa  last week. It's hard to say at this time. Recommend no changes yet, give it another 3 weeks, Lyndy will call to report response then.  I'll discuss with my supervising physician, Dr. Lorene Macintosh, for his opinion.   Continue Wellbutrin  XL 150 mg, 1 every day.  Continue Klonopin  0.5 mg, 1 po bid prn.  Continue clonidine  0.1 mg, 1 p.o. twice daily by another provider. Continue gabapentin  400 mg qid.  Continue Hydroxyzine  25 mg, 1 tid prn. Continue Zyprexa  20 mg at bedtime.  Continue Seroquel  XR  800 mg nightly, at 9 PM.  Continue Zoloft  100 mg, 2 po qd.  Strongly recommend counseling. Return in 6 weeks.  Verneita Cooks, PA-C

## 2024-07-26 ENCOUNTER — Ambulatory Visit (HOSPITAL_BASED_OUTPATIENT_CLINIC_OR_DEPARTMENT_OTHER): Attending: Family Medicine | Admitting: Physical Therapy

## 2024-08-16 ENCOUNTER — Other Ambulatory Visit: Payer: Self-pay | Admitting: Physician Assistant

## 2024-08-16 DIAGNOSIS — F411 Generalized anxiety disorder: Secondary | ICD-10-CM

## 2024-08-18 ENCOUNTER — Telehealth: Payer: Self-pay | Admitting: Physician Assistant

## 2024-08-18 NOTE — Telephone Encounter (Signed)
 Pt needs rf of Olanzapine    [employer]

## 2024-08-19 MED ORDER — OLANZAPINE 20 MG PO TABS: 20.0000 mg | ORAL_TABLET | Freq: Every day | ORAL | 0 refills | Status: DC

## 2024-08-19 NOTE — Telephone Encounter (Signed)
 Sent!

## 2024-08-30 ENCOUNTER — Other Ambulatory Visit: Payer: Self-pay

## 2024-08-30 ENCOUNTER — Emergency Department (HOSPITAL_COMMUNITY)

## 2024-08-30 ENCOUNTER — Encounter (HOSPITAL_COMMUNITY): Payer: Self-pay | Admitting: Family Medicine

## 2024-08-30 ENCOUNTER — Observation Stay (HOSPITAL_COMMUNITY)
Admission: EM | Admit: 2024-08-30 | Discharge: 2024-08-31 | Disposition: A | Discharge status: Home Health | Attending: Internal Medicine | Admitting: Internal Medicine

## 2024-08-30 DIAGNOSIS — F419 Anxiety disorder, unspecified: Secondary | ICD-10-CM | POA: Diagnosis not present

## 2024-08-30 DIAGNOSIS — G8929 Other chronic pain: Secondary | ICD-10-CM | POA: Diagnosis not present

## 2024-08-30 DIAGNOSIS — W19XXXA Unspecified fall, initial encounter: Secondary | ICD-10-CM | POA: Diagnosis present

## 2024-08-30 DIAGNOSIS — F600 Paranoid personality disorder: Secondary | ICD-10-CM | POA: Diagnosis not present

## 2024-08-30 DIAGNOSIS — I10 Essential (primary) hypertension: Secondary | ICD-10-CM | POA: Diagnosis present

## 2024-08-30 DIAGNOSIS — G35D Multiple sclerosis, unspecified: Secondary | ICD-10-CM | POA: Diagnosis not present

## 2024-08-30 DIAGNOSIS — S0181XA Laceration without foreign body of other part of head, initial encounter: Secondary | ICD-10-CM | POA: Diagnosis present

## 2024-08-30 DIAGNOSIS — K7469 Other cirrhosis of liver: Secondary | ICD-10-CM | POA: Diagnosis not present

## 2024-08-30 DIAGNOSIS — R55 Syncope and collapse: Secondary | ICD-10-CM | POA: Diagnosis not present

## 2024-08-30 DIAGNOSIS — Z7902 Long term (current) use of antithrombotics/antiplatelets: Secondary | ICD-10-CM | POA: Diagnosis not present

## 2024-08-30 DIAGNOSIS — S01411A Laceration without foreign body of right cheek and temporomandibular area, initial encounter: Secondary | ICD-10-CM | POA: Diagnosis not present

## 2024-08-30 DIAGNOSIS — K7460 Unspecified cirrhosis of liver: Secondary | ICD-10-CM | POA: Insufficient documentation

## 2024-08-30 DIAGNOSIS — F32A Depression, unspecified: Secondary | ICD-10-CM | POA: Diagnosis present

## 2024-08-30 DIAGNOSIS — S0292XA Unspecified fracture of facial bones, initial encounter for closed fracture: Principal | ICD-10-CM | POA: Insufficient documentation

## 2024-08-30 DIAGNOSIS — G35A Relapsing-remitting multiple sclerosis: Secondary | ICD-10-CM | POA: Diagnosis present

## 2024-08-30 DIAGNOSIS — Z8673 Personal history of transient ischemic attack (TIA), and cerebral infarction without residual deficits: Secondary | ICD-10-CM | POA: Insufficient documentation

## 2024-08-30 DIAGNOSIS — S0990XA Unspecified injury of head, initial encounter: Secondary | ICD-10-CM | POA: Diagnosis not present

## 2024-08-30 DIAGNOSIS — G894 Chronic pain syndrome: Secondary | ICD-10-CM | POA: Diagnosis not present

## 2024-08-30 DIAGNOSIS — R220 Localized swelling, mass and lump, head: Secondary | ICD-10-CM | POA: Diagnosis not present

## 2024-08-30 LAB — COMPREHENSIVE METABOLIC PANEL WITH GFR
ALT: 50 U/L — ABNORMAL HIGH (ref 0–44)
AST: 49 U/L — ABNORMAL HIGH (ref 15–41)
Albumin: 3.8 g/dL (ref 3.5–5.0)
Alkaline Phosphatase: 96 U/L (ref 38–126)
Anion gap: 10 (ref 5–15)
BUN: 14 mg/dL (ref 8–23)
CO2: 20 mmol/L — ABNORMAL LOW (ref 22–32)
Calcium: 9 mg/dL (ref 8.9–10.3)
Chloride: 108 mmol/L (ref 98–111)
Creatinine, Ser: 0.93 mg/dL (ref 0.44–1.00)
GFR, Estimated: >60 mL/min (ref >60)
Glucose, Bld: 123 mg/dL — ABNORMAL HIGH (ref 70–99)
Potassium: 3.9 mmol/L (ref 3.5–5.1)
Sodium: 138 mmol/L (ref 135–145)
Total Bilirubin: 0.7 mg/dL (ref 0.0–1.2)
Total Protein: 7 g/dL (ref 6.5–8.1)

## 2024-08-30 LAB — I-STAT CHEM 8, ED
BUN: 14 mg/dL (ref 8–23)
Calcium, Ion: 1.09 mmol/L — ABNORMAL LOW (ref 1.15–1.40)
Chloride: 109 mmol/L (ref 98–111)
Creatinine, Ser: 0.9 mg/dL (ref 0.44–1.00)
Glucose, Bld: 119 mg/dL — ABNORMAL HIGH (ref 70–99)
HCT: 33 % — ABNORMAL LOW (ref 36.0–46.0)
Hemoglobin: 11.2 g/dL — ABNORMAL LOW (ref 12.0–15.0)
Potassium: 3.8 mmol/L (ref 3.5–5.1)
Sodium: 143 mmol/L (ref 135–145)
TCO2: 20 mmol/L — ABNORMAL LOW (ref 22–32)

## 2024-08-30 LAB — BASIC METABOLIC PANEL WITH GFR
Anion gap: 11 (ref 5–15)
BUN: 13 mg/dL (ref 8–23)
CO2: 22 mmol/L (ref 22–32)
Calcium: 8.9 mg/dL (ref 8.9–10.3)
Chloride: 107 mmol/L (ref 98–111)
Creatinine, Ser: 0.86 mg/dL (ref 0.44–1.00)
GFR, Estimated: >60 mL/min (ref >60)
Glucose, Bld: 114 mg/dL — ABNORMAL HIGH (ref 70–99)
Potassium: 3.8 mmol/L (ref 3.5–5.1)
Sodium: 140 mmol/L (ref 135–145)

## 2024-08-30 LAB — CBC WITH DIFFERENTIAL/PLATELET
Abs Immature Granulocytes: 0.02 K/uL (ref 0.00–0.07)
Basophils Absolute: 0 K/uL (ref 0.0–0.1)
Basophils Relative: 1 %
Eosinophils Absolute: 0.1 K/uL (ref 0.0–0.5)
Eosinophils Relative: 1 %
HCT: 33.6 % — ABNORMAL LOW (ref 36.0–46.0)
Hemoglobin: 11.5 g/dL — ABNORMAL LOW (ref 12.0–15.0)
Immature Granulocytes: 0 %
Lymphocytes Relative: 24 %
Lymphs Abs: 1.5 K/uL (ref 0.7–4.0)
MCH: 29.1 pg (ref 26.0–34.0)
MCHC: 34.2 g/dL (ref 30.0–36.0)
MCV: 85.1 fL (ref 80.0–100.0)
Monocytes Absolute: 0.4 K/uL (ref 0.1–1.0)
Monocytes Relative: 7 %
Neutro Abs: 4.2 K/uL (ref 1.7–7.7)
Neutrophils Relative %: 67 %
Platelets: 184 K/uL (ref 150–400)
RBC: 3.95 MIL/uL (ref 3.87–5.11)
RDW: 12.5 % (ref 11.5–15.5)
WBC: 6.3 K/uL (ref 4.0–10.5)
nRBC: 0 % (ref 0.0–0.2)

## 2024-08-30 LAB — CBC
HCT: 33.6 % — ABNORMAL LOW (ref 36.0–46.0)
Hemoglobin: 11.4 g/dL — ABNORMAL LOW (ref 12.0–15.0)
MCH: 28.8 pg (ref 26.0–34.0)
MCHC: 33.9 g/dL (ref 30.0–36.0)
MCV: 84.8 fL (ref 80.0–100.0)
Platelets: 188 K/uL (ref 150–400)
RBC: 3.96 MIL/uL (ref 3.87–5.11)
RDW: 12.6 % (ref 11.5–15.5)
WBC: 6 K/uL (ref 4.0–10.5)
nRBC: 0 % (ref 0.0–0.2)

## 2024-08-30 LAB — ETHANOL: Alcohol, Ethyl (B): <15 mg/dL (ref <15)

## 2024-08-30 LAB — TROPONIN I (HIGH SENSITIVITY): Troponin I (High Sensitivity): 4 ng/L (ref <18)

## 2024-08-30 MED ORDER — HYDROXYZINE HCL 25 MG PO TABS: 25.0000 mg | ORAL_TABLET | Freq: Three times a day (TID) | ORAL | Status: DC | PRN

## 2024-08-30 MED ORDER — SERTRALINE HCL 100 MG PO TABS
200.0000 mg | ORAL_TABLET | Freq: Every day | ORAL | Status: DC
  Administered 2024-08-30 – 2024-08-31 (×2): 200 mg via ORAL
  Filled 2024-08-30 (×2): qty 2

## 2024-08-30 MED ORDER — BUPROPION HCL ER (XL) 150 MG PO TB24
150.0000 mg | ORAL_TABLET | Freq: Every day | ORAL | Status: DC
  Administered 2024-08-30 – 2024-08-31 (×2): 150 mg via ORAL
  Filled 2024-08-30 (×2): qty 1

## 2024-08-30 MED ORDER — ACETAMINOPHEN 325 MG PO TABS: 650.0000 mg | ORAL_TABLET | Freq: Four times a day (QID) | ORAL | Status: DC | PRN

## 2024-08-30 MED ORDER — ACETAMINOPHEN 650 MG RE SUPP: 650.0000 mg | Freq: Four times a day (QID) | RECTAL | Status: DC | PRN

## 2024-08-30 MED ORDER — SODIUM CHLORIDE 0.9% FLUSH
3.0000 mL | Freq: Two times a day (BID) | INTRAVENOUS | Status: DC
  Administered 2024-08-30 – 2024-08-31 (×3): 3 mL via INTRAVENOUS

## 2024-08-30 MED ORDER — CLOPIDOGREL BISULFATE 75 MG PO TABS
75.0000 mg | ORAL_TABLET | Freq: Every day | ORAL | Status: DC
  Administered 2024-08-30 – 2024-08-31 (×2): 75 mg via ORAL
  Filled 2024-08-30 (×2): qty 1

## 2024-08-30 MED ORDER — OXYCODONE HCL 5 MG PO TABS
5.0000 mg | ORAL_TABLET | ORAL | Status: DC | PRN
  Administered 2024-08-30: 5 mg via ORAL
  Administered 2024-08-30: 10 mg via ORAL
  Administered 2024-08-30: 5 mg via ORAL
  Administered 2024-08-31 (×2): 10 mg via ORAL
  Filled 2024-08-30 (×2): qty 1
  Filled 2024-08-30: qty 2
  Filled 2024-08-30: qty 1
  Filled 2024-08-30: qty 2
  Filled 2024-08-30: qty 1
  Filled 2024-08-30: qty 2

## 2024-08-30 MED ORDER — QUETIAPINE FUMARATE ER 200 MG PO TB24
800.0000 mg | ORAL_TABLET | Freq: Every day | ORAL | Status: DC
  Administered 2024-08-30: 800 mg via ORAL
  Filled 2024-08-30 (×4): qty 4

## 2024-08-30 MED ORDER — CLONIDINE HCL 0.1 MG PO TABS
0.1000 mg | ORAL_TABLET | Freq: Two times a day (BID) | ORAL | Status: DC
  Administered 2024-08-30 – 2024-08-31 (×3): 0.1 mg via ORAL
  Filled 2024-08-30 (×3): qty 1

## 2024-08-30 MED ORDER — GABAPENTIN 400 MG PO CAPS
400.0000 mg | ORAL_CAPSULE | Freq: Two times a day (BID) | ORAL | Status: DC
Start: 2024-08-30 — End: 2024-08-31
  Administered 2024-08-30 – 2024-08-31 (×2): 400 mg via ORAL
  Filled 2024-08-30 (×2): qty 1

## 2024-08-30 MED ORDER — CLONAZEPAM 0.5 MG PO TABS
0.5000 mg | ORAL_TABLET | Freq: Two times a day (BID) | ORAL | Status: DC | PRN
  Administered 2024-08-30 – 2024-08-31 (×2): 0.5 mg via ORAL
  Filled 2024-08-30 (×2): qty 1

## 2024-08-30 MED ORDER — ONDANSETRON HCL 4 MG/2ML IJ SOLN: 4.0000 mg | Freq: Four times a day (QID) | INTRAMUSCULAR | Status: DC | PRN

## 2024-08-30 MED ORDER — ONDANSETRON HCL 4 MG PO TABS: 4.0000 mg | ORAL_TABLET | Freq: Four times a day (QID) | ORAL | Status: DC | PRN

## 2024-08-30 MED ORDER — SENNOSIDES-DOCUSATE SODIUM 8.6-50 MG PO TABS: 1.0000 | ORAL_TABLET | Freq: Every evening | ORAL | Status: DC | PRN

## 2024-08-30 MED ORDER — LIDOCAINE-EPINEPHRINE-TETRACAINE (LET) TOPICAL GEL
3.0000 mL | Freq: Once | TOPICAL | Status: AC
  Administered 2024-08-30: 3 mL via TOPICAL
  Filled 2024-08-30: qty 3

## 2024-08-30 MED ORDER — LIDOCAINE-EPINEPHRINE (PF) 2 %-1:200000 IJ SOLN
20.0000 mL | Freq: Once | INTRAMUSCULAR | Status: AC
  Administered 2024-08-30: 20 mL
  Filled 2024-08-30: qty 20

## 2024-08-30 MED ORDER — ASPIRIN 81 MG PO CHEW
81.0000 mg | CHEWABLE_TABLET | Freq: Every day | ORAL | Status: DC
  Administered 2024-08-30 – 2024-08-31 (×2): 81 mg via ORAL
  Filled 2024-08-30 (×2): qty 1

## 2024-08-30 MED ORDER — OLANZAPINE 10 MG PO TABS
20.0000 mg | ORAL_TABLET | Freq: Every day | ORAL | Status: DC
  Administered 2024-08-30: 20 mg via ORAL
  Filled 2024-08-30 (×3): qty 2

## 2024-08-30 MED ORDER — GABAPENTIN 400 MG PO CAPS
400.0000 mg | ORAL_CAPSULE | Freq: Three times a day (TID) | ORAL | Status: DC
  Administered 2024-08-30 (×2): 400 mg via ORAL
  Filled 2024-08-30 (×3): qty 4

## 2024-08-30 NOTE — TOC Initial Note (Addendum)
 Transition of Care [facility]) - Initial/Assessment Note    Patient Details  Name: Becky Wolfe MRN: [id number] Date of Birth: 07-Dec-1960  [facility]) CM/SW Contact:    Bridget Cordella Simmonds, LCSW Phone Number: 08/30/2024, 5:18 PM  Clinical Narrative:    CSW spoke with pt by phone regarding PT recommendation for SNF.  Pt from home with her husband Tanda, permission given to speak with him.  No current home services.  Pt does not want to pursue SNF placement at this time, reports Tanda is home to assist her and she would prefer [facility].  Current DME in the home: walker, wheelchair.  Pt previously have a poor experience with [facility] and would prefer another provider.   CSW spoke with husband Tanda by phone as well, he confirmed that he is able to assist pt at home and is in agreement with HHPT.                 Expected Discharge Plan: Home w Home Health Services Barriers to Discharge: Continued Medical Work up   Patient Goals and CMS Choice     Choice offered to / list presented to : Patient (does not want [facility])      Expected Discharge Plan and Services In-house Referral: Clinical Social Work   Post Acute Care Choice: Home Health Living arrangements for the past 2 months: Single Family Home                                      Prior Living Arrangements/Services Living arrangements for the past 2 months: Single Family Home Lives with:: Spouse Patient language and need for interpreter reviewed:: Yes Do you feel safe going back to the place where you live?: Yes      Need for Family Participation in Patient Care: Yes (Comment) Care giver support system in place?: Yes (comment) Current home services: Other (comment) (none) Criminal Activity/Legal Involvement Pertinent to Current Situation/Hospitalization: No - Comment as needed  Activities of Daily Living      Permission Sought/Granted Permission sought to share information with : Family  Supports Permission granted to share information with : Yes, Verbal Permission Granted  Share Information with NAME: husband Tanda           Emotional Assessment Appearance::  (phone contact) Attitude/Demeanor/Rapport: Engaged Affect (typically observed): Appropriate, Pleasant        Admission diagnosis:  Fall [W19.XXXA] Patient Active Problem List   Diagnosis Date Noted   Fall 08/30/2024   Facial laceration 08/30/2024   Depression    AMS (altered mental status) 02/13/2024   Trigger finger of left thumb 07/22/2023   Polypharmacy 04/14/2022   Pulmonary nodule 04/14/2022   Normocytic anemia 04/14/2022   Transaminitis 04/14/2022   Acute UTI (urinary tract infection) 04/14/2022   History of hepatitis C 04/14/2022   Acute respiratory failure with hypoxia (HCC) 04/14/2022   Middle ear effusion, left 04/14/2022   Acute encephalopathy 04/13/2022   Personal history of transient ischemic attack (TIA), and cerebral infarction without residual deficits 02/13/2022   ETD (Eustachian tube dysfunction), bilateral 01/14/2022   Syncope 08/31/2021   Family history of coronary artery disease in mother 08/31/2021   TIA (transient ischemic attack) 07/11/2021   Chronic deep vein thrombosis (DVT) of left lower extremity (HCC) 04/24/2021   Opioid dependence (HCC) 04/24/2021   Other cirrhosis of liver (HCC) 04/24/2021   Essential hypertension  04/24/2021   Relapsing remitting multiple sclerosis 04/24/2021   Slurred speech 03/15/2021   Protruded cervical disc 06/22/2020   Complex regional pain syndrome type 1 of lower extremity 06/22/2020   Numbness 12/30/2018   Cervical myelopathy (HCC) 08/05/2018   Neck pain 02/19/2018   History of fusion of cervical spine 02/19/2018   Urinary hesitancy 02/19/2018   Chronic non-specific white matter lesions on MRI 02/19/2018   Accidental overdose 03/07/2016   Atherosclerosis of native coronary artery of native heart without angina pectoris 02/10/2015    History of DVT (deep vein thrombosis) 02/10/2015   Tobacco abuse 02/10/2015   Anxiety 06/16/2013   Right leg pain 04/23/2013   DVT (deep vein thrombosis) in pregnancy 04/23/2013   Chronic pain disorder 04/23/2013   RSD lower limb 04/23/2013   Chronic anticoagulation 04/23/2013   CNS demyelinating disorder (HCC) 10/19/2012   PCP:  Loreli Kins, MD Pharmacy:   [facility] #[id number] [place], [place] - [address] AT [facility] OF [facility] & [address] [place] [address] Phone: [phone] Fax: [phone]  [facility] [address] [place] [address] Phone: [phone] Fax: [phone]  [facility] #[id number] - [place], [place] - [address] AT [facility] OF [address] [place] [address] Phone: [phone] Fax: [phone]     Social Drivers of Health (SDOH) Social History: SDOH Screenings   Food Insecurity: No Food Insecurity (02/13/2024)  Housing: Low Risk  (02/13/2024)  Transportation Needs: No Transportation Needs (02/13/2024)  Utilities: Not At Risk (02/13/2024)  Depression (PHQ2-9): Medium Risk (07/03/2020)  Financial Resource Strain: Low Risk  (07/03/2020)  Physical Activity: Inactive (07/03/2020)  Social Connections: Unknown (03/12/2022)   Received from [facility]  Stress: Stress Concern Present (07/03/2020)  Tobacco Use: High Risk (08/30/2024)   SDOH Interventions:     Readmission Risk Interventions    02/16/2024    4:12 PM  Readmission Risk Prevention Plan  Transportation Screening Complete  PCP or Specialist Appt within 3-5 Days Complete  HRI or Home Care Consult Complete  Social Work Consult for Recovery Care Planning/Counseling Complete  Palliative Care Screening Not Applicable  Medication Review Oceanographer) Referral to Pharmacy

## 2024-08-30 NOTE — ED Triage Notes (Addendum)
 Patient lost her balance and fell at home this evening , hit her face against the dresser , presents with right cheek laceration /swelling . She can not recall if she lost consciousness . Takes Plavix .

## 2024-08-30 NOTE — Progress Notes (Signed)
 Transition of Care [facility]) - CAGE-AID Screening   Patient Details  Name: Becky Wolfe MRN: [id number] Date of Birth: [birth year]     MARINDA LIONEL Sora, RN Phone Number: 08/30/2024, 4:07 AM   Clinical Narrative:  No current etoh usage + cigarette usage No current elicit substance usage, remote hx of IV drug use  CAGE-AID Screening:    Have You Ever Felt You Ought to Cut Down on Your Drinking or Drug Use?: Yes Have People Annoyed You By Critizing Your Drinking Or Drug Use?: Yes Have You Felt Bad Or Guilty About Your Drinking Or Drug Use?: Yes Have You Ever Had a Drink or Used Drugs First Thing In The Morning to Steady Your Nerves or to Get Rid of a Hangover?: Yes CAGE-AID Score: 4  Substance Abuse Education Offered: Yes

## 2024-08-30 NOTE — Progress Notes (Signed)
 PROGRESS NOTE  Becky Wolfe  DOB: 01/15/1961  PCP: Becky Wolfe FMW:[id number]  DOA: 08/30/2024  LOS: 0 days  Hospital Day: 1  Subjective: Patient was seen and examined this morning. Pleasant middle-aged African-American female. Lying on bed.  Not in distress.  Has slurred speech.  Says she uses walker at baseline.  She confirmed she follows up with Dr. Leatrice but denies a clear diagnosis of multiple sclerosis. Chart reviewed.  Afebrile, heart rate 70s and 80s, blood pressure in 140s this morning Labs this morning with WBC count 6, hemoglobin 11.4, renal function normal  Seen by PT later this morning.  She was unsteady and at high risk of fall.  SNF recommended  Brief narrative: Becky Wolfe is a 63 y.o. female with PMH significant for multiple sclerosis, treated hep C, liver cirrhosis, HTN, TIA on aspirin  and Plavix , h/o DVT no longer anticoagulation, chronic pain, anxiety, paranoia Last night, patient was brought to the ED from home with fall and facial injury. Per report, patient lost her balance, fell and hit her face against the dresser sustaining right cheek laceration/swelling, does not recall losing consciousness, or having any prodromal symptoms of chest pain, palpitation, lightheadedness. She does not drink alcohol  and has not had any recent addition of new medication or change in doses.  In the ED, patient was afebrile, hemodynamically stable, breathing on room air Labs with normal WBC, normal renal function, hemoglobin 11.5, slightly elevated transaminases EKG showed sinus rhythm with normal intervals and no blocks.   CT head, CT maxillofacial, CT cervical spine, chest x-ray unremarkable Patient laceration was sutured.  Kept in observation overnight because of unclear etiology of fall  Assessment and plan: Fall Impaired mobility ??? Multiple sclerosis  Presented to ED after a fall.  Denies any premonition symptoms of lightheadedness, chest discomfort,  palpitation, LOC. It is not clear if patient has a diagnosis of multiple sclerosis.  She confirmed that she follows up with neurologist Dr. Leatrice but denies a clear diagnosis of multiple sclerosis.  I do not see any immunosuppressive medicine and her list of home meds.  She has slurred speech which she says is her baseline. Patient states that she uses walker to get around.  She is not sure how she ended up falling last night.  She is also on multiple pain meds and neuropsych meds on a regular basis.   Seen by PT earlier.  Given her unsteadiness, recommended SNF.  Facial laceration  Repaired with sutures in ED No underlying fracture on CT  Follow-up for suture removal in 5 days       Depression, anxiety, paranoia  States she follows up with psychiatrist at [facility].   PTA meds- Wellbutrin , Zyprexa , Seroquel , Zoloft , and as-needed Klonopin    Currently continued on all   Chronic pain Reflex sympathetic dystrophy PTA meds-Percocet 10/325 4 times daily, Neurontin  400 mg 4 times daily. Denies being on methadone . Currently continued on all   Cirrhosis  Appears compensated, followed by Dr. Burnette     Hypertension  Blood pressure stable on clonidine  0.1 mg twice daily   H/o TIA  Continue aspirin  81 mg daily and Plavix  75 mg daily   Mobility:  PT Orders: Active   PT Follow up Rec:    Goals of care   Code Status: Full Code     DVT prophylaxis:  SCDs Start: 08/30/24 0223   Antimicrobials: None Fluid: None Consultants: None Family Communication: None at bedside  Status: Observation Level of care:  Telemetry Medical   Patient is from: Home Needs to continue in-hospital care: Unsteady gait. Anticipated d/c to: SNF recommended      Diet:  Diet Order             Diet regular Room service appropriate? Yes; Fluid consistency: Thin  Diet effective now                   Scheduled Meds:  aspirin   81 mg Oral Daily   buPROPion   150 mg Oral Daily   cloNIDine    0.1 mg Oral BID   clopidogrel   75 mg Oral Daily   gabapentin   400 mg Oral TID AC & HS   OLANZapine   20 mg Oral QHS   QUEtiapine   800 mg Oral QHS   sertraline   200 mg Oral Daily   sodium chloride  flush  3 mL Intravenous Q12H    PRN meds: acetaminophen  **OR** acetaminophen , clonazePAM , hydrOXYzine , ondansetron  **OR** ondansetron  (ZOFRAN ) IV, oxyCODONE , senna-docusate   Infusions:    Antimicrobials: Anti-infectives (From admission, onward)    None       Objective: Vitals:   08/30/24 1045 08/30/24 1100  BP:  (!) 136/115  Pulse: 81 87  Resp: 15 (!) 21  Temp:    SpO2: 100% 98%   No intake or output data in the 24 hours ending 08/30/24 1308 Filed Weights   08/30/24 0052  Weight: 68 kg   Weight change:  Body mass index is 29.28 kg/m.   Physical Exam: General exam: Pleasant, middle-aged African-American female Skin: No rashes, lesions or ulcers. HEENT: Atraumatic, normocephalic, no obvious bleeding Lungs: Clear to auscultation bilaterally,  CVS: S1, S2, no murmur,   GI/Abd: Soft, nontender, nondistended, bowel sound present,   CNS: Alert, awake, oriented x 3.  Speech slurred at baseline Psychiatry: Mood appropriate Extremities: No pedal edema, no calf tenderness,   Data Review: I have personally reviewed the laboratory data and studies available.  F/u labs ordered Unresulted Labs (From admission, onward)     Start     Ordered   08/30/24 0500  Basic metabolic panel  Daily,   R      08/30/24 0224   08/30/24 0500  CBC  Daily,   R      08/30/24 0224            Signed, Becky Rota, Wolfe [facility] 08/30/2024

## 2024-08-30 NOTE — Progress Notes (Addendum)
 Physical Therapy Evaluation Patient Details Name: Becky Wolfe MRN: [id number] DOB: 1961-01-30 Today's Date: 08/30/2024  History of Present Illness  Becky Wolfe is a 63 y.o. female admitted 10/20 after fall with facial injury.  PMH: multiple sclerosis, anxiety, paranoia, chronic pain, history of DVT no longer anticoagulated, history of TIA, hypertension, hepatitis C (treated), and cirrhosis  Clinical Impression  Pt admitted with above diagnosis. Pt was able to ambulate with rollator but needing mod assist at times due to poor postural stability and ataxia. Pt slightly weaker on right hemibody. Also pt with slurred speech during evaluation. Pt does not correct balance with cues. Pt does report incr headaches, fullness left ear, ringing/static in left ear, and hearing loss left ear. Along with ataxia/dizziness, updated MD regarding these symptoms. Noted CT reveals left mastoid effusion and husband and pt also state pt has drainage out the left ear.  Pt may need post acute rehab < 3 hours day. Will follow acutely.  Pt currently with functional limitations due to the deficits listed below (see PT Problem List). Pt will benefit from acute skilled PT to increase their independence and safety with mobility to allow discharge.           If plan is discharge home, recommend the following: A lot of help with walking and/or transfers;A lot of help with bathing/dressing/bathroom;Assistance with cooking/housework;Assist for transportation;Help with stairs or ramp for entrance   Can travel by private vehicle   No    Equipment Recommendations None recommended by PT  Recommendations for Other Services       Functional Status Assessment Patient has had a recent decline in their functional status and demonstrates the ability to make significant improvements in function in a reasonable and predictable amount of time.     Precautions / Restrictions Precautions Precautions: Fall Restrictions Weight  Bearing Restrictions Per Provider Order: No      Mobility  Bed Mobility Overal bed mobility: Needs Assistance Bed Mobility: Supine to Sit     Supine to sit: Min assist     General bed mobility comments: assist for LEs and trunk elevation with incr time to achieve    Transfers Overall transfer level: Needs assistance Equipment used: Rollator (4 wheels) Transfers: Sit to/from Stand Sit to Stand: Contact guard assist           General transfer comment: Cues needed for hand placement with pt impulsive needing steadying assist    Ambulation/Gait Ambulation/Gait assistance: Min assist Gait Distance (Feet): 15 Feet Assistive device: Rollator (4 wheels) Gait Pattern/deviations: Step-to pattern, Decreased stride length, Decreased step length - right, Decreased step length - left, Knee flexed in stance - right, Knee flexed in stance - left, Ataxic, Staggering left, Staggering right, Trunk flexed   Gait velocity interpretation: <1.31 ft/sec, indicative of household ambulator   General Gait Details: Pt with ataxic gait with significant forward lean at times and pt unaware even with cuing. Pt very unsteady and needed constant cues and support for balance with rollator.  Stairs            Wheelchair Mobility     Tilt Bed    Modified Rankin (Stroke Patients Only) Modified Rankin (Stroke Patients Only) Pre-Morbid Rankin Score: Moderate disability Modified Rankin: Moderately severe disability     Balance Overall balance assessment: Needs assistance Sitting-balance support: No upper extremity supported, Feet supported Sitting balance-Leahy Scale: Fair     Standing balance support: Bilateral upper extremity supported, During functional activity Standing balance-Leahy Scale: Poor  Standing balance comment: relies on UE support                             Pertinent Vitals/Pain Pain Assessment Pain Assessment: 0-10 Pain Score: 7  Pain Location: head Pain  Descriptors / Indicators: Headache Pain Intervention(s): Limited activity within patient's tolerance, Monitored during session, Repositioned    Home Living Family/patient expects to be discharged to:: Private residence Living Arrangements: Spouse/significant other Available Help at Discharge: Family;Available 24 hours/day Type of Home: House Home Access: Stairs to enter Entrance Stairs-Rails: Left Entrance Stairs-Number of Steps: 4   Home Layout: One level Home Equipment: Agricultural consultant (2 wheels);Rollator (4 wheels);Cane - single point;Shower seat;Wheelchair - manual      Prior Function Prior Level of Function : Needs assist;History of Falls (last six months)             Mobility Comments: Reports multiple falls. Uses rollator for most mobility. ADLs Comments: States mostly ind but occasionally has husband assist in/out of shower. Husband assists with IADL such as medication management and driving.     Extremity/Trunk Assessment   Upper Extremity Assessment Upper Extremity Assessment: Defer to OT evaluation    Lower Extremity Assessment Lower Extremity Assessment: RLE deficits/detail RLE Deficits / Details: grossly 3-/5    Cervical / Trunk Assessment Cervical / Trunk Assessment: Kyphotic  Communication   Communication Communication: Impaired Factors Affecting Communication: Reduced clarity of speech (Pt with slurred speech, notified MD)    Cognition Arousal: Alert Behavior During Therapy: Flat affect   PT - Cognitive impairments: Safety/Judgement, Problem solving                         Following commands: Intact       Cueing       General Comments General comments (skin integrity, edema, etc.): See VS flowsheet for orthostatic VS.    Exercises     Assessment/Plan    PT Assessment Patient needs continued PT services  PT Problem List Decreased activity tolerance;Decreased balance;Decreased mobility;Decreased knowledge of use of  DME;Decreased safety awareness;Decreased knowledge of precautions;Cardiopulmonary status limiting activity       PT Treatment Interventions DME instruction;Gait training;Functional mobility training;Therapeutic activities;Therapeutic exercise;Balance training;Patient/family education    PT Goals (Current goals can be found in the Care Plan section)  Acute Rehab PT Goals Patient Stated Goal: to go home PT Goal Formulation: With patient Time For Goal Achievement: 09/13/24 Potential to Achieve Goals: Good    Frequency Min 2X/week     Co-evaluation               AM-PAC PT 6 Clicks Mobility  Outcome Measure Help needed turning from your back to your side while in a flat bed without using bedrails?: None Help needed moving from lying on your back to sitting on the side of a flat bed without using bedrails?: A Little Help needed moving to and from a bed to a chair (including a wheelchair)?: A Lot Help needed standing up from a chair using your arms (e.g., wheelchair or bedside chair)?: A Lot Help needed to walk in hospital room?: A Lot Help needed climbing 3-5 steps with a railing? : Total 6 Click Score: 14    End of Session Equipment Utilized During Treatment: Gait belt Activity Tolerance: Patient limited by fatigue Patient left: with call bell/phone within reach (on strethcer) Nurse Communication: Mobility status PT Visit Diagnosis: Unsteadiness on feet (R26.81)  Time: 8953-8890 PT Time Calculation (min) (ACUTE ONLY): 23 min   Charges:   PT Evaluation $PT Eval Moderate Complexity: 1 Mod PT Treatments $Gait Training: 8-22 mins PT General Charges $$ ACUTE PT VISIT: 1 Visit         Deondrae Mcgrail M,PT Acute Rehab Services [phone]   Stephane JULIANNA Bevel 08/30/2024, 4:10 PM

## 2024-08-30 NOTE — H&P (Addendum)
 History and Physical    Becky Wolfe FMW:[id number] DOB: [birth year] DOA: 08/30/2024  PCP: Loreli Kins, MD   Patient coming from: Home   Chief Complaint: Fall, facial wound   HPI: Becky Wolfe is a 63 y.o. female with medical history significant for multiple sclerosis, anxiety, paranoia, chronic pain, history of DVT no longer anticoagulated, history of TIA, hypertension, hepatitis C (treated), and cirrhosis who presents after a fall with facial injury.  Patient reports that she was in her usual state and was walking into her bedroom to go to bed for the night when she fell and struck her face on a piece of furniture.  She denies any loss of consciousness, did not have any associated lightheadedness, chest pain, or palpitations, but is not sure how or why she fell.    She insist that there was no LOC.  She suffered a laceration on the right cheek and is experiencing facial pain and a headache now.  There is no new focal numbness or weakness.  She does not drink alcohol  and has not had any new medications or changes in dosing.  ED Course: Upon arrival to the ED, patient is found to be afebrile and saturating well on room air with normal HR and stable BP.  Labs are notable for normal creatinine, normal WBC, hemoglobin 11.5, and slight elevation in transaminases.  EKG demonstrates sinus rhythm with normal intervals and no blocks.  There are no acute findings on chest x-ray, head CT, cervical spine CT, or maxillofacial CT.  Hospitalists are asked to admit for high risk syncope but the patient insists that there was no loss of consciousness.  She agrees to observation in the hospital overnight and evaluation by PT in the morning.  Review of Systems:  All other systems reviewed and apart from HPI, are negative.  Past Medical History:  Diagnosis Date   Anxiety 06/16/2013   hx. panic attacks, none recent   Blood transfusion without reported diagnosis    20 yrs ago after childbirth    Bronchitis    hx of   Chronic neck pain    Constipation    Depression    DVT (deep venous thrombosis) (HCC) 2015   rt. leg    Headache(784.0)    migraines-has decreased   Heart murmur    Dr. Woodroe   Hepatitis C    dx. Hep. C(s/p transfusion age 86) low Hgb  .-multiple transfusions. Treated successfully   Multiple sclerosis    dx. -77yrs ago, then med stopped-due to liver enzymes changes,occ. periods of weakness in armsd.  was seen at [facility] and tolded that she does not have MSrops things   Pneumonia    hx of , ? mild emphysema on CT scan 5'14, multiple pulmonary nodules   RSD (reflex sympathetic dystrophy) 06/16/2013   legs   RSD (reflex sympathetic dystrophy) 2009   TIA (transient ischemic attack)    hx of (weakness left side)-none in 1 yr    Past Surgical History:  Procedure Laterality Date   ABDOMINAL HYSTERECTOMY     ANAL RECTAL MANOMETRY N/A 04/11/2015   Procedure: ANO RECTAL MANOMETRY;  Surgeon: Elsie Cree, MD;  Location: [facility];  Service: Endoscopy;  Laterality: N/A;   ANAL RECTAL MANOMETRY N/A 05/21/2017   Procedure: ANO RECTAL MANOMETRY;  Surgeon: Cree Elsie, MD;  Location: [facility];  Service: Endoscopy;  Laterality: N/A;   ANKLE SURGERY Right    no retained hardware   ANTERIOR CERVICAL DECOMP/DISCECTOMY FUSION  09/21/2012  Procedure: ANTERIOR CERVICAL DECOMPRESSION/DISCECTOMY FUSION 2 LEVELS;  Surgeon: Lynwood JONELLE Mill, MD;  Location: [facility];  Service: Neurosurgery;  Laterality: N/A;  Cervical five-six, cervical seven-thoracic one Anterior cervical decompression/diskectomy/fusion/Lifenet bone/trestle plate/Prior Cervical six--seven Anterior cervical fusion/Removing trestle plate   ANTERIOR CERVICAL DECOMP/DISCECTOMY FUSION N/A 08/05/2018   Procedure: Cervical four-five Anterior cervical decompression/discectomy/fusion;  Surgeon: Cheryle Debby LABOR, MD;  Location: [facility];  Service: Neurosurgery;  Laterality: N/A;   APPENDECTOMY     CESAREAN  SECTION     twice   COLONOSCOPY     ESOPHAGOGASTRODUODENOSCOPY (EGD) WITH PROPOFOL  N/A 06/30/2013   Procedure: ESOPHAGOGASTRODUODENOSCOPY (EGD) WITH PROPOFOL ;  Surgeon: Elsie Cree, MD;  [facility]: [facility];  Service: Endoscopy;  Laterality: N/A;   NECK SURGERY     TRIGGER FINGER RELEASE Left 07/22/2023   Procedure: RELEASE TRIGGER FINGER/A-1 PULLEY;  Surgeon: Beverley Evalene BIRCH, MD;  Location: [facility];  Service: Orthopedics;  Laterality: Left;   WEDGE RESECTION     left ovary with cyst removed ,hx. multiple cysts removed prior    Social History:   reports that she has been smoking cigarettes. She has a 4.3 pack-year smoking history. She has never used smokeless tobacco. She reports that she does not drink alcohol  and does not use drugs.  Allergies  Allergen Reactions   Etodolac Swelling and Other (See Comments)    Facial swelling    Ambien  [Zolpidem  Tartrate] Other (See Comments)    Headaches, memory loss    Namenda [Memantine] Other (See Comments)    Headaches     Family History  Problem Relation Age of Onset   Heart attack Mother 38       with CABG   Heart disease Mother    Hypertension Mother    Migraines Mother    Heart attack Father    Heart disease Father    Diabetes Mellitus II Father    Hypertension Father    Lupus Sister    Depression Sister    Migraines Sister    Renal Disease Brother    Diabetes Sister    Hypertension Sister    Migraines Sister    Migraines Sister    Healthy Sister    Healthy Sister    Healthy Sister    Healthy Brother    Dementia Maternal Grandfather    Colon cancer Maternal Grandmother    Heart disease Maternal Grandmother    Hypertension Maternal Grandmother    Healthy Son    Migraines Son      Prior to Admission medications   Medication Sig Start Date End Date Taking? Authorizing Provider  aspirin  81 MG chewable tablet Chew by mouth daily.    [provider]  buPROPion  (WELLBUTRIN  XL) 150 MG  24 hr tablet Take 150 mg by mouth daily.    [provider]  clonazePAM  (KLONOPIN ) 0.5 MG tablet TAKE 1 TABLET(0.5 MG) BY MOUTH TWICE DAILY AS NEEDED FOR ANXIETY 08/16/24   White, Redell LABOR, NP  cloNIDine  (CATAPRES ) 0.1 MG tablet Take 1 tablet (0.1 mg total) by mouth 2 (two) times daily. 02/14/24   Cindy Garnette POUR, MD  clopidogrel  (PLAVIX ) 75 MG tablet TAKE 1 TABLET(75 MG) BY MOUTH DAILY 05/12/24   Sater, Charlie LABOR, MD  gabapentin  (NEURONTIN ) 400 MG capsule Take 1 capsule (400 mg total) by mouth 4 (four) times daily. 02/19/24   Rhys Verneita DASEN, PA-C  hydrOXYzine  (ATARAX ) 25 MG tablet Take 1 tablet (25 mg total) by mouth 3 (three) times daily as needed.  06/22/24   Sater, Charlie LABOR, MD  methadone  (DOLOPHINE ) 5 MG tablet Take 1 tablet (5 mg total) by mouth every 12 (twelve) hours. Patient taking differently: Take 5 mg by mouth in the morning, at noon, and at bedtime. 04/15/22   Vann, Jessica U, DO  OLANZapine  (ZYPREXA ) 20 MG tablet Take 1 tablet (20 mg total) by mouth at bedtime. 08/19/24   Rhys Verneita DASEN, PA-C  QUEtiapine  (SEROQUEL  XR) 400 MG 24 hr tablet Take 2 tablets (800 mg total) by mouth at bedtime. 05/11/24   Rhys Verneita T, PA-C  sertraline  (ZOLOFT ) 100 MG tablet TAKE 2 TABLETS(200 MG) BY MOUTH DAILY 05/11/24   Rhys Verneita DASEN, [place]    Physical Exam: Vitals:   08/30/24 0115 08/30/24 0130 08/30/24 0145 08/30/24 0200  BP:  117/65 (!) 103/56 124/68  Pulse:      Resp:  17 19 17   Temp: 97.9 F (36.6 C)     TempSrc: Temporal     SpO2:  98%    Weight:      Height:        Constitutional: NAD, no pallor or diaphoresis   Eyes: PERTLA, lids and conjunctivae normal ENMT: Mucous membranes are moist. Posterior pharynx clear of any exudate or lesions.   Neck: supple, no masses  Respiratory: no wheezing, no crackles. No accessory muscle use.  Cardiovascular: S1 & S2 heard, regular rate and rhythm. No extremity edema.  Abdomen: No tenderness, soft. Bowel sounds active.  Musculoskeletal: no clubbing  / cyanosis. No joint deformity upper and lower extremities.   Skin: Right cheek laceration and ecchymosis. Warm, dry, well-perfused. Neurologic: CN 2-12 grossly intact. Sensation intact, DTR normal. Moving all extremities. Alert and oriented to person, place, and situation.  Psychiatric: Calm. Cooperative.    Labs and Imaging on Admission: I have personally reviewed following labs and imaging studies  CBC: Recent Labs  Lab 08/30/24 0058 08/30/24 0104  WBC 6.3  --   NEUTROABS 4.2  --   HGB 11.5* 11.2*  HCT 33.6* 33.0*  MCV 85.1  --   PLT 184  --    Basic Metabolic Panel: Recent Labs  Lab 08/30/24 0058 08/30/24 0104  NA 138 143  K 3.9 3.8  CL 108 109  CO2 20*  --   GLUCOSE 123* 119*  BUN 14 14  CREATININE 0.93 0.90  CALCIUM 9.0  --    GFR: Estimated Creatinine Clearance: 54.3 mL/min (by C-G formula based on SCr of 0.9 mg/dL). Liver Function Tests: Recent Labs  Lab 08/30/24 0058  AST 49*  ALT 50*  ALKPHOS 96  BILITOT 0.7  PROT 7.0  ALBUMIN 3.8   No results for input(s): LIPASE, AMYLASE in the last 168 hours. No results for input(s): AMMONIA  in the last 168 hours. Coagulation Profile: No results for input(s): INR, PROTIME in the last 168 hours. Cardiac Enzymes: No results for input(s): CKTOTAL, CKMB, CKMBINDEX, TROPONINI in the last 168 hours. BNP (last 3 results) No results for input(s): PROBNP in the last 8760 hours. HbA1C: No results for input(s): HGBA1C in the last 72 hours. CBG: No results for input(s): GLUCAP in the last 168 hours. Lipid Profile: No results for input(s): CHOL, HDL, LDLCALC, TRIG, CHOLHDL, LDLDIRECT in the last 72 hours. Thyroid  Function Tests: No results for input(s): TSH, T4TOTAL, FREET4, T3FREE, THYROIDAB in the last 72 hours. Anemia Panel: No results for input(s): VITAMINB12, FOLATE, FERRITIN, TIBC, IRON, RETICCTPCT in the last 72 hours. Urine analysis:    Component Value  Date/Time  COLORURINE YELLOW 05/04/2024 1734   APPEARANCEUR CLEAR 05/04/2024 1734   APPEARANCEUR Cloudy (A) 12/17/2022 1030   LABSPEC 1.019 05/04/2024 1734   PHURINE 6.0 05/04/2024 1734   GLUCOSEU NEGATIVE 05/04/2024 1734   HGBUR NEGATIVE 05/04/2024 1734   BILIRUBINUR NEGATIVE 05/04/2024 1734   BILIRUBINUR Negative 12/17/2022 1030   KETONESUR NEGATIVE 05/04/2024 1734   PROTEINUR NEGATIVE 05/04/2024 1734   UROBILINOGEN 1.0 07/19/2022 1418   UROBILINOGEN 1.0 09/02/2012 1917   NITRITE NEGATIVE 05/04/2024 1734   LEUKOCYTESUR SMALL (A) 05/04/2024 1734   Sepsis Labs: @LABRCNTIP (procalcitonin:4,lacticidven:4) )No results found for this or any previous visit (from the past 240 hours).   Radiological Exams on Admission: CT Head Wo Contrast Result Date: 08/30/2024 EXAM: CT HEAD, FACIAL BONES AND CERVICAL SPINE WITHOUT CONTRAST 08/30/2024 01:13:48 AM TECHNIQUE: CT of the head, facial bones and cervical spine was performed without the administration of intravenous contrast. Multiplanar reformatted images are provided for review. Automated exposure control, iterative reconstruction, and/or weight based adjustment of the mA/kV was utilized to reduce the radiation dose to as low as reasonably achievable. COMPARISON: CT head and c-spine 12/22/2023, CT head 06/01/2024. CLINICAL HISTORY: Head trauma, moderate-severe. Patient lost her balance and fell at home this evening, hit her face against the dresser, presents with right cheek laceration/swelling. She cannot recall if she lost consciousness. Takes Plavix . FINDINGS: CT HEAD BRAIN AND VENTRICLES: Patchy and confluent areas of decreased attenuation are noted throughout the deep and periventricular white matter of the cerebral hemispheres bilaterally suggestive of chronic microvascular ischemic changes. Atherosclerotic calcifications are present within the cavernous internal carotid arteries. No acute intracranial hemorrhage. No mass effect or midline shift.  No extra-axial fluid collection. No evidence of acute infarct. No hydrocephalus. SKULL AND SCALP: No acute skull fracture. No scalp hematoma. CT FACIAL BONES FACIAL BONES: Multiple caries are noted. Periapical lucencies noted along several maxillary teeth. No acute facial fracture. No mandibular dislocation. No suspicious bone lesion. ORBITS: No acute traumatic injury. SINUSES AND MASTOIDS: Partial left mastoid effusion. The visualized paranasal sinuses are clear. SOFT TISSUES: No acute abnormality. CT CERVICAL SPINE BONES AND ALIGNMENT: C4-C5 and C7-T1 anterior discectomy and fusion surgical hardware. C5-C6 and C6-C7 interbody surgical hardware. No acute fracture or traumatic malalignment. DEGENERATIVE CHANGES: Mild degenerative changes of the spine. SOFT TISSUES: No prevertebral soft tissue swelling. IMPRESSION: 1. No acute intracranial abnormality. 2. No acute facial bone fracture. 3. No acute fracture or traumatic malalignment of the cervical spine. 4. Partial left mastoid effusion. Electronically signed by: Morgane Naveau MD 08/30/2024 01:25 AM EDT RP Workstation: [id number]   CT Maxillofacial Wo Contrast Result Date: 08/30/2024 EXAM: CT HEAD, FACIAL BONES AND CERVICAL SPINE WITHOUT CONTRAST 08/30/2024 01:13:48 AM TECHNIQUE: CT of the head, facial bones and cervical spine was performed without the administration of intravenous contrast. Multiplanar reformatted images are provided for review. Automated exposure control, iterative reconstruction, and/or weight based adjustment of the mA/kV was utilized to reduce the radiation dose to as low as reasonably achievable. COMPARISON: CT head and c-spine 12/22/2023, CT head 06/01/2024. CLINICAL HISTORY: Head trauma, moderate-severe. Patient lost her balance and fell at home this evening, hit her face against the dresser, presents with right cheek laceration/swelling. She cannot recall if she lost consciousness. Takes Plavix . FINDINGS: CT HEAD BRAIN AND VENTRICLES:  Patchy and confluent areas of decreased attenuation are noted throughout the deep and periventricular white matter of the cerebral hemispheres bilaterally suggestive of chronic microvascular ischemic changes. Atherosclerotic calcifications are present within the cavernous internal carotid arteries. No acute intracranial  hemorrhage. No mass effect or midline shift. No extra-axial fluid collection. No evidence of acute infarct. No hydrocephalus. SKULL AND SCALP: No acute skull fracture. No scalp hematoma. CT FACIAL BONES FACIAL BONES: Multiple caries are noted. Periapical lucencies noted along several maxillary teeth. No acute facial fracture. No mandibular dislocation. No suspicious bone lesion. ORBITS: No acute traumatic injury. SINUSES AND MASTOIDS: Partial left mastoid effusion. The visualized paranasal sinuses are clear. SOFT TISSUES: No acute abnormality. CT CERVICAL SPINE BONES AND ALIGNMENT: C4-C5 and C7-T1 anterior discectomy and fusion surgical hardware. C5-C6 and C6-C7 interbody surgical hardware. No acute fracture or traumatic malalignment. DEGENERATIVE CHANGES: Mild degenerative changes of the spine. SOFT TISSUES: No prevertebral soft tissue swelling. IMPRESSION: 1. No acute intracranial abnormality. 2. No acute facial bone fracture. 3. No acute fracture or traumatic malalignment of the cervical spine. 4. Partial left mastoid effusion. Electronically signed by: Morgane Naveau MD 08/30/2024 01:25 AM EDT RP Workstation: [id number]   CT Cervical Spine Wo Contrast Result Date: 08/30/2024 EXAM: CT HEAD, FACIAL BONES AND CERVICAL SPINE WITHOUT CONTRAST 08/30/2024 01:13:48 AM TECHNIQUE: CT of the head, facial bones and cervical spine was performed without the administration of intravenous contrast. Multiplanar reformatted images are provided for review. Automated exposure control, iterative reconstruction, and/or weight based adjustment of the mA/kV was utilized to reduce the radiation dose to as low as  reasonably achievable. COMPARISON: CT head and c-spine 12/22/2023, CT head 06/01/2024. CLINICAL HISTORY: Head trauma, moderate-severe. Patient lost her balance and fell at home this evening, hit her face against the dresser, presents with right cheek laceration/swelling. She cannot recall if she lost consciousness. Takes Plavix . FINDINGS: CT HEAD BRAIN AND VENTRICLES: Patchy and confluent areas of decreased attenuation are noted throughout the deep and periventricular white matter of the cerebral hemispheres bilaterally suggestive of chronic microvascular ischemic changes. Atherosclerotic calcifications are present within the cavernous internal carotid arteries. No acute intracranial hemorrhage. No mass effect or midline shift. No extra-axial fluid collection. No evidence of acute infarct. No hydrocephalus. SKULL AND SCALP: No acute skull fracture. No scalp hematoma. CT FACIAL BONES FACIAL BONES: Multiple caries are noted. Periapical lucencies noted along several maxillary teeth. No acute facial fracture. No mandibular dislocation. No suspicious bone lesion. ORBITS: No acute traumatic injury. SINUSES AND MASTOIDS: Partial left mastoid effusion. The visualized paranasal sinuses are clear. SOFT TISSUES: No acute abnormality. CT CERVICAL SPINE BONES AND ALIGNMENT: C4-C5 and C7-T1 anterior discectomy and fusion surgical hardware. C5-C6 and C6-C7 interbody surgical hardware. No acute fracture or traumatic malalignment. DEGENERATIVE CHANGES: Mild degenerative changes of the spine. SOFT TISSUES: No prevertebral soft tissue swelling. IMPRESSION: 1. No acute intracranial abnormality. 2. No acute facial bone fracture. 3. No acute fracture or traumatic malalignment of the cervical spine. 4. Partial left mastoid effusion. Electronically signed by: Morgane Naveau MD 08/30/2024 01:25 AM EDT RP Workstation: [id number]   DG Chest Portable 1 View Result Date: 08/30/2024 EXAM: 1 VIEW XRAY OF THE CHEST 08/30/2024 12:59:53 AM  COMPARISON: Chest x-ray 05/17/2023, CT chest 06/24/2023. CLINICAL HISTORY: Fall. Patient lost her balance and fell at home this evening, hit her face against the dresser, presents with right cheek laceration/swelling. She cannot recall if she lost consciousness. Takes Plavix . FINDINGS: LUNGS AND PLEURA: No focal pulmonary opacity. No pulmonary edema. No pleural effusion. No pneumothorax. HEART AND MEDIASTINUM: No acute abnormality of the cardiac and mediastinal silhouettes. BONES AND SOFT TISSUES: Cervical thoracic spine surgical hardware. No acute osseous abnormality. IMPRESSION: 1. No acute cardiopulmonary process. Electronically signed by: Morgane Naveau MD  08/30/2024 01:08 AM EDT RP Workstation: [id number]    EKG: Independently reviewed. Sinus rhythm.   Assessment/Plan   1. Fall - Presents after fall at home; she is not sure why she fell but denies LOC, lightheadedness, chest discomfort, or palpitations      - Fall precautions, PT consult    2. Facial laceration  - Repaired with sutures in ED - No underlying fracture on CT  - Follow-up for suture removal in 5 days     3. Multiple sclerosis  - Follows with Dr. Vear for relapsing-remitting MS - She denies having any new deficits    4. Depression, anxiety, paranoia  - Continue Wellbutrin , Zyprexa , Seroquel , Zoloft , and as-needed Klonopin     5. Chronic pain - Prescription database reviewed, plan to continue home regimen    6. Cirrhosis  - Appears compensated, followed by Dr. Burnette    7. Hypertension  - Continue clonidine    8. Hx of TIA  - Continue aspirin  and Plavix     DVT prophylaxis: SCDs  Code Status: Full  Level of Care: Level of care: Telemetry Medical Family Communication: Husband at bedside  Disposition Plan:  Patient is from: Home  Anticipated d/c is to: TBD Anticipated d/c date is: 08/31/24  Patient currently: Pending cardiac monitoring, orthostatic vitals, and PT evaluation  Consults called: None  Admission  status: Observation     Evalene GORMAN Sprinkles, MD [facility]  08/30/2024, 2:27 AM

## 2024-08-30 NOTE — Progress Notes (Signed)
 Orthopedic Tech Progress Note Patient Details:  Becky Wolfe Jul 01, 1961 [id number]  Responded to level 2 trauma, not needed at this time  Patient ID: Becky Wolfe, female   DOB: October 06, 1961, 63 y.o.   MRN: [id number]  Becky Wolfe December 08/30/2024, 12:52 AM

## 2024-08-30 NOTE — Progress Notes (Signed)
 ..Trauma Response Nurse Documentation   Becky Wolfe is a 63 y.o. female arriving to [facility] ED via POV  On clopidogrel  75 mg daily. Trauma was activated as a Level 2 by charge nurse based on the following trauma criteria Elderly patients > 65 with head trauma on anti-coagulation (excluding ASA).  Patient cleared for CT by Dr. Carita. Pt transported to CT with trauma response nurse present to monitor. RN remained with the patient throughout their absence from the department for clinical observation.   GCS 15.  Trauma MD Arrival Time: N/A  History   Past Medical History:  Diagnosis Date   Anxiety 06/16/2013   hx. panic attacks, none recent   Blood transfusion without reported diagnosis    20 yrs ago after childbirth   Bronchitis    hx of   Chronic neck pain    Constipation    Depression    DVT (deep venous thrombosis) (HCC) 2015   rt. leg    Headache(784.0)    migraines-has decreased   Heart murmur    Dr. Woodroe   Hepatitis C    dx. Hep. C(s/p transfusion age 29) low Hgb  .-multiple transfusions. Treated successfully   Multiple sclerosis    dx. -67yrs ago, then med stopped-due to liver enzymes changes,occ. periods of weakness in armsd.  was seen at [facility] and tolded that she does not have MSrops things   Pneumonia    hx of , ? mild emphysema on CT scan 5'14, multiple pulmonary nodules   RSD (reflex sympathetic dystrophy) 06/16/2013   legs   RSD (reflex sympathetic dystrophy) 2009   TIA (transient ischemic attack)    hx of (weakness left side)-none in 1 yr     Past Surgical History:  Procedure Laterality Date   ABDOMINAL HYSTERECTOMY     ANAL RECTAL MANOMETRY N/A 04/11/2015   Procedure: ANO RECTAL MANOMETRY;  Surgeon: Becky Cree, MD;  [facility]: [facility];  Service: Endoscopy;  Laterality: N/A;   ANAL RECTAL MANOMETRY N/A 05/21/2017   Procedure: ANO RECTAL MANOMETRY;  Surgeon: Wolfe Elsie, MD;  Location: [facility];  Service: Endoscopy;  Laterality:  N/A;   ANKLE SURGERY Right    no retained hardware   ANTERIOR CERVICAL DECOMP/DISCECTOMY FUSION  09/21/2012   Procedure: ANTERIOR CERVICAL DECOMPRESSION/DISCECTOMY FUSION 2 LEVELS;  Surgeon: Becky JONELLE Mill, MD;  [facility]: [facility];  Service: Neurosurgery;  Laterality: N/A;  Cervical five-six, cervical seven-thoracic one Anterior cervical decompression/diskectomy/fusion/Lifenet bone/trestle plate/Prior Cervical six--seven Anterior cervical fusion/Removing trestle plate   ANTERIOR CERVICAL DECOMP/DISCECTOMY FUSION N/A 08/05/2018   Procedure: Cervical four-five Anterior cervical decompression/discectomy/fusion;  Surgeon: Becky Debby LABOR, MD;  Location: [facility];  Service: Neurosurgery;  Laterality: N/A;   APPENDECTOMY     CESAREAN SECTION     twice   COLONOSCOPY     ESOPHAGOGASTRODUODENOSCOPY (EGD) WITH PROPOFOL  N/A 06/30/2013   Procedure: ESOPHAGOGASTRODUODENOSCOPY (EGD) WITH PROPOFOL ;  Surgeon: Becky Cree, MD;  [facility]: [facility];  Service: Endoscopy;  Laterality: N/A;   NECK SURGERY     TRIGGER FINGER RELEASE Left 07/22/2023   Procedure: RELEASE TRIGGER FINGER/A-1 PULLEY;  Surgeon: Becky Evalene BIRCH, MD;  Location: [facility];  Service: Orthopedics;  Laterality: Left;   WEDGE RESECTION     left ovary with cyst removed ,hx. multiple cysts removed prior       Initial Focused Assessment (If applicable, or please see trauma documentation):   CT's Completed:   CT Head, CT Maxillofacial, and CT C-Spine   Interventions:  Plan for disposition:  Admission to floor   Consults completed:  none   Event Summary: Pt arrived POV with husband after falling at home, Pt reports she takes Plavix .  pt reports he husband distributes her medication which include several sedating medications. Pt reports she must have tripped and struck the R side of her face on dresser, small hemostatic laceration noted to R cheek. Are cleaned, LET applied, pt transported to CT and back trauma  bay on CCM with TRN. Pt A & O but does appear somewhat sedated.  Lac to face repaired by EDP, Neg CT's, pt to be admitted medically for further work up for possible syncope and collapse. Pt is under pain management contract, current smoker, denies etoh and former IV drug use.   MTP Summary (If applicable): N/A  Bedside handoff with ED RN Becky Wolfe.    Becky Wolfe  Trauma Response RN  Please call TRN at [phone] for further assistance.

## 2024-08-30 NOTE — ED Provider Notes (Signed)
 [place] [facility] AT [facility] Provider Note   CSN: [id number] Arrival date & time: 08/30/24  0028     Patient presents with: Fall ( takes Plavix )    Becky Wolfe is a 63 y.o. female.   Patient presents as a level 2 trauma.  Had a fall sustaining laceration to her right cheek.  Does not know what happened.  Apparently hit her head on a dresser.  Does not know what made her fall.  Denies tripping or stumbling.  No preceding dizziness or lightheadedness.  Does not know if she lost consciousness.  Does take Plavix .  Level 2 trauma activated on arrival.  No visual changes.  No neck or back pain.  No chest pain or abdominal pain.  No shortness of breath.  Does have a history of multiple sclerosis, TIA, DVT, RSD.  Does not take any blood thinners other than Plavix .  Does not think she lost consciousness.  No preceding dizziness or lightheadedness.  The history is provided by the patient.       Prior to Admission medications   Medication Sig Start Date End Date Taking? Authorizing Provider  aspirin  81 MG chewable tablet Chew by mouth daily.    [provider]  buPROPion  (WELLBUTRIN  XL) 150 MG 24 hr tablet Take 150 mg by mouth daily.    [provider]  clonazePAM  (KLONOPIN ) 0.5 MG tablet TAKE 1 TABLET(0.5 MG) BY MOUTH TWICE DAILY AS NEEDED FOR ANXIETY 08/16/24   White, Redell LABOR, NP  cloNIDine  (CATAPRES ) 0.1 MG tablet Take 1 tablet (0.1 mg total) by mouth 2 (two) times daily. 02/14/24   Cindy Garnette POUR, MD  clopidogrel  (PLAVIX ) 75 MG tablet TAKE 1 TABLET(75 MG) BY MOUTH DAILY 05/12/24   Sater, Charlie LABOR, MD  gabapentin  (NEURONTIN ) 400 MG capsule Take 1 capsule (400 mg total) by mouth 4 (four) times daily. 02/19/24   Rhys Verneita DASEN, PA-C  hydrOXYzine  (ATARAX ) 25 MG tablet Take 1 tablet (25 mg total) by mouth 3 (three) times daily as needed. 06/22/24   Sater, Charlie LABOR, MD  methadone  (DOLOPHINE ) 5 MG tablet Take 1 tablet (5 mg total) by mouth every 12  (twelve) hours. Patient taking differently: Take 5 mg by mouth in the morning, at noon, and at bedtime. 04/15/22   Vann, Jessica U, DO  OLANZapine  (ZYPREXA ) 20 MG tablet Take 1 tablet (20 mg total) by mouth at bedtime. 08/19/24   Rhys Verneita T, PA-C  QUEtiapine  (SEROQUEL  XR) 400 MG 24 hr tablet Take 2 tablets (800 mg total) by mouth at bedtime. 05/11/24   Rhys Verneita T, PA-C  sertraline  (ZOLOFT ) 100 MG tablet TAKE 2 TABLETS(200 MG) BY MOUTH DAILY 05/11/24   Rhys Verneita T, PA-C    Allergies: Etodolac, Ambien  [zolpidem  tartrate], and Namenda [memantine]    Review of Systems  Constitutional:  Negative for activity change, appetite change and fever.  HENT:  Negative for congestion and rhinorrhea.   Respiratory:  Negative for cough, chest tightness and shortness of breath.   Cardiovascular:  Negative for chest pain.  Gastrointestinal:  Negative for abdominal pain, nausea and vomiting.  Genitourinary:  Negative for dysuria.  Musculoskeletal:  Negative for arthralgias and myalgias.  Skin:  Positive for wound.  Neurological:  Positive for weakness and headaches. Negative for dizziness.   all other systems are negative except as noted in the HPI and PMH.    Updated Vital Signs LMP 10/16/2011   Physical Exam Vitals and nursing note reviewed.  Constitutional:      General: She is not in acute distress.    Appearance: She is well-developed.     Comments: Slurred speech  HENT:     Head: Normocephalic and atraumatic.     Mouth/Throat:     Pharynx: No oropharyngeal exudate.     Comments: 2 cm laceration R cheek is hemostatic Eyes:     Conjunctiva/sclera: Conjunctivae normal.     Pupils: Pupils are equal, round, and reactive to light.  Neck:     Comments: No Midline C-spine tenderness Cardiovascular:     Rate and Rhythm: Normal rate and regular rhythm.     Heart sounds: Normal heart sounds. No murmur heard. Pulmonary:     Effort: Pulmonary effort is normal. No respiratory distress.      Breath sounds: Normal breath sounds.  Abdominal:     Palpations: Abdomen is soft.     Tenderness: There is no abdominal tenderness. There is no guarding or rebound.  Musculoskeletal:        General: No tenderness. Normal range of motion.     Cervical back: Normal range of motion and neck supple.     Comments: No T or L spine tenderness  Skin:    General: Skin is warm.  Neurological:     Mental Status: She is alert and oriented to person, place, and time.     Cranial Nerves: No cranial nerve deficit.     Motor: No abnormal muscle tone.     Coordination: Coordination normal.     Comments:  5/5 strength throughout. CN 2-12 intact.Equal grip strength.   Psychiatric:        Behavior: Behavior normal.     (all labs ordered are listed, but only abnormal results are displayed) Labs Reviewed  CBC WITH DIFFERENTIAL/PLATELET - Abnormal; Notable for the following components:      Result Value   Hemoglobin 11.5 (*)    HCT 33.6 (*)    All other components within normal limits  COMPREHENSIVE METABOLIC PANEL WITH GFR - Abnormal; Notable for the following components:   CO2 20 (*)    Glucose, Bld 123 (*)    AST 49 (*)    ALT 50 (*)    All other components within normal limits  I-STAT CHEM 8, ED - Abnormal; Notable for the following components:   Glucose, Bld 119 (*)    Calcium, Ion 1.09 (*)    TCO2 20 (*)    Hemoglobin 11.2 (*)    HCT 33.0 (*)    All other components within normal limits  ETHANOL  RAPID URINE DRUG SCREEN, HOSP PERFORMED  TROPONIN I (HIGH SENSITIVITY)    EKG: EKG Interpretation Date/Time:  Monday August 30 2024 00:55:59 EDT Ventricular Rate:  76 PR Interval:  145 QRS Duration:  89 QT Interval:  365 QTC Calculation: 411 R Axis:   58  Text Interpretation: Sinus rhythm Consider left atrial enlargement Low voltage, precordial leads duplicate Confirmed by Carita Senior [phone]) on 08/30/2024 1:08:29 AM  Radiology: CT Head Wo Contrast Result Date:  08/30/2024 EXAM: CT HEAD, FACIAL BONES AND CERVICAL SPINE WITHOUT CONTRAST 08/30/2024 01:13:48 AM TECHNIQUE: CT of the head, facial bones and cervical spine was performed without the administration of intravenous contrast. Multiplanar reformatted images are provided for review. Automated exposure control, iterative reconstruction, and/or weight based adjustment of the mA/kV was utilized to reduce the radiation dose to as low as reasonably achievable. COMPARISON: CT head and c-spine 12/22/2023, CT head 06/01/2024. CLINICAL HISTORY:  Head trauma, moderate-severe. Patient lost her balance and fell at home this evening, hit her face against the dresser, presents with right cheek laceration/swelling. She cannot recall if she lost consciousness. Takes Plavix . FINDINGS: CT HEAD BRAIN AND VENTRICLES: Patchy and confluent areas of decreased attenuation are noted throughout the deep and periventricular white matter of the cerebral hemispheres bilaterally suggestive of chronic microvascular ischemic changes. Atherosclerotic calcifications are present within the cavernous internal carotid arteries. No acute intracranial hemorrhage. No mass effect or midline shift. No extra-axial fluid collection. No evidence of acute infarct. No hydrocephalus. SKULL AND SCALP: No acute skull fracture. No scalp hematoma. CT FACIAL BONES FACIAL BONES: Multiple caries are noted. Periapical lucencies noted along several maxillary teeth. No acute facial fracture. No mandibular dislocation. No suspicious bone lesion. ORBITS: No acute traumatic injury. SINUSES AND MASTOIDS: Partial left mastoid effusion. The visualized paranasal sinuses are clear. SOFT TISSUES: No acute abnormality. CT CERVICAL SPINE BONES AND ALIGNMENT: C4-C5 and C7-T1 anterior discectomy and fusion surgical hardware. C5-C6 and C6-C7 interbody surgical hardware. No acute fracture or traumatic malalignment. DEGENERATIVE CHANGES: Mild degenerative changes of the spine. SOFT TISSUES: No  prevertebral soft tissue swelling. IMPRESSION: 1. No acute intracranial abnormality. 2. No acute facial bone fracture. 3. No acute fracture or traumatic malalignment of the cervical spine. 4. Partial left mastoid effusion. Electronically signed by: Morgane Naveau MD 08/30/2024 01:25 AM EDT RP Workstation: [id number]   CT Maxillofacial Wo Contrast Result Date: 08/30/2024 EXAM: CT HEAD, FACIAL BONES AND CERVICAL SPINE WITHOUT CONTRAST 08/30/2024 01:13:48 AM TECHNIQUE: CT of the head, facial bones and cervical spine was performed without the administration of intravenous contrast. Multiplanar reformatted images are provided for review. Automated exposure control, iterative reconstruction, and/or weight based adjustment of the mA/kV was utilized to reduce the radiation dose to as low as reasonably achievable. COMPARISON: CT head and c-spine 12/22/2023, CT head 06/01/2024. CLINICAL HISTORY: Head trauma, moderate-severe. Patient lost her balance and fell at home this evening, hit her face against the dresser, presents with right cheek laceration/swelling. She cannot recall if she lost consciousness. Takes Plavix . FINDINGS: CT HEAD BRAIN AND VENTRICLES: Patchy and confluent areas of decreased attenuation are noted throughout the deep and periventricular white matter of the cerebral hemispheres bilaterally suggestive of chronic microvascular ischemic changes. Atherosclerotic calcifications are present within the cavernous internal carotid arteries. No acute intracranial hemorrhage. No mass effect or midline shift. No extra-axial fluid collection. No evidence of acute infarct. No hydrocephalus. SKULL AND SCALP: No acute skull fracture. No scalp hematoma. CT FACIAL BONES FACIAL BONES: Multiple caries are noted. Periapical lucencies noted along several maxillary teeth. No acute facial fracture. No mandibular dislocation. No suspicious bone lesion. ORBITS: No acute traumatic injury. SINUSES AND MASTOIDS: Partial left  mastoid effusion. The visualized paranasal sinuses are clear. SOFT TISSUES: No acute abnormality. CT CERVICAL SPINE BONES AND ALIGNMENT: C4-C5 and C7-T1 anterior discectomy and fusion surgical hardware. C5-C6 and C6-C7 interbody surgical hardware. No acute fracture or traumatic malalignment. DEGENERATIVE CHANGES: Mild degenerative changes of the spine. SOFT TISSUES: No prevertebral soft tissue swelling. IMPRESSION: 1. No acute intracranial abnormality. 2. No acute facial bone fracture. 3. No acute fracture or traumatic malalignment of the cervical spine. 4. Partial left mastoid effusion. Electronically signed by: Morgane Naveau MD 08/30/2024 01:25 AM EDT RP Workstation: [id number]   CT Cervical Spine Wo Contrast Result Date: 08/30/2024 EXAM: CT HEAD, FACIAL BONES AND CERVICAL SPINE WITHOUT CONTRAST 08/30/2024 01:13:48 AM TECHNIQUE: CT of the head, facial bones and cervical spine was performed  without the administration of intravenous contrast. Multiplanar reformatted images are provided for review. Automated exposure control, iterative reconstruction, and/or weight based adjustment of the mA/kV was utilized to reduce the radiation dose to as low as reasonably achievable. COMPARISON: CT head and c-spine 12/22/2023, CT head 06/01/2024. CLINICAL HISTORY: Head trauma, moderate-severe. Patient lost her balance and fell at home this evening, hit her face against the dresser, presents with right cheek laceration/swelling. She cannot recall if she lost consciousness. Takes Plavix . FINDINGS: CT HEAD BRAIN AND VENTRICLES: Patchy and confluent areas of decreased attenuation are noted throughout the deep and periventricular white matter of the cerebral hemispheres bilaterally suggestive of chronic microvascular ischemic changes. Atherosclerotic calcifications are present within the cavernous internal carotid arteries. No acute intracranial hemorrhage. No mass effect or midline shift. No extra-axial fluid collection. No  evidence of acute infarct. No hydrocephalus. SKULL AND SCALP: No acute skull fracture. No scalp hematoma. CT FACIAL BONES FACIAL BONES: Multiple caries are noted. Periapical lucencies noted along several maxillary teeth. No acute facial fracture. No mandibular dislocation. No suspicious bone lesion. ORBITS: No acute traumatic injury. SINUSES AND MASTOIDS: Partial left mastoid effusion. The visualized paranasal sinuses are clear. SOFT TISSUES: No acute abnormality. CT CERVICAL SPINE BONES AND ALIGNMENT: C4-C5 and C7-T1 anterior discectomy and fusion surgical hardware. C5-C6 and C6-C7 interbody surgical hardware. No acute fracture or traumatic malalignment. DEGENERATIVE CHANGES: Mild degenerative changes of the spine. SOFT TISSUES: No prevertebral soft tissue swelling. IMPRESSION: 1. No acute intracranial abnormality. 2. No acute facial bone fracture. 3. No acute fracture or traumatic malalignment of the cervical spine. 4. Partial left mastoid effusion. Electronically signed by: Morgane Naveau MD 08/30/2024 01:25 AM EDT RP Workstation: [id number]   DG Chest Portable 1 View Result Date: 08/30/2024 EXAM: 1 VIEW XRAY OF THE CHEST 08/30/2024 12:59:53 AM COMPARISON: Chest x-ray 05/17/2023, CT chest 06/24/2023. CLINICAL HISTORY: Fall. Patient lost her balance and fell at home this evening, hit her face against the dresser, presents with right cheek laceration/swelling. She cannot recall if she lost consciousness. Takes Plavix . FINDINGS: LUNGS AND PLEURA: No focal pulmonary opacity. No pulmonary edema. No pleural effusion. No pneumothorax. HEART AND MEDIASTINUM: No acute abnormality of the cardiac and mediastinal silhouettes. BONES AND SOFT TISSUES: Cervical thoracic spine surgical hardware. No acute osseous abnormality. IMPRESSION: 1. No acute cardiopulmonary process. Electronically signed by: Morgane Naveau MD 08/30/2024 01:08 AM EDT RP Workstation: [id number]     .Laceration Repair  Date/Time: 08/30/2024 1:22  AM  Performed by: Carita Senior, MD Authorized by: Carita Senior, MD   Consent:    Consent obtained:  Verbal   Consent given by:  Patient   Risks, benefits, and alternatives were discussed: yes     Risks discussed:  Infection, need for additional repair, nerve damage, poor wound healing, poor cosmetic result, pain, retained foreign body, tendon damage and vascular damage   Alternatives discussed:  No treatment Universal protocol:    Procedure explained and questions answered to patient or proxy's satisfaction: yes     Relevant documents present and verified: yes     Test results available: yes     Immediately prior to procedure, a time out was called: yes     Patient identity confirmed:  Verbally with patient Anesthesia:    Anesthesia method:  Topical application and local infiltration   Topical anesthetic:  LET   Local anesthetic:  Lidocaine  2% WITH epi Laceration details:    Location:  Face   Face location:  R cheek   Length (cm):  2 Pre-procedure details:    Preparation:  Patient was prepped and draped in usual sterile fashion Exploration:    Hemostasis achieved with:  Epinephrine  and direct pressure   Imaging obtained: bedside ultrasound     Imaging outcome: foreign body not noted     Wound exploration: wound explored through full range of motion     Wound extent: no nerve damage, no tendon damage and no vascular damage     Contaminated: no   Treatment:    Area cleansed with:  Povidone-iodine    Amount of cleaning:  Standard   Irrigation solution:  Sterile saline Skin repair:    Repair method:  Sutures   Suture size:  6-0   Suture material:  Prolene   Suture technique:  Simple interrupted   Number of sutures:  3 Approximation:    Approximation:  Close Repair type:    Repair type:  Simple Post-procedure details:    Dressing:  Adhesive bandage   Procedure completion:  Tolerated    Medications Ordered in the ED  lidocaine -EPINEPHrine -tetracaine  (LET) topical  gel (has no administration in time range)  lidocaine -EPINEPHrine  (XYLOCAINE  W/EPI) 2 %-1:200000 (PF) injection 20 mL (has no administration in time range)                                    Medical Decision Making Amount and/or Complexity of Data Reviewed Labs: ordered. Decision-making details documented in ED Course. Radiology: ordered and independent interpretation performed. Decision-making details documented in ED Course. ECG/medicine tests: ordered and independent interpretation performed. Decision-making details documented in ED Course.  Risk Prescription drug management. Decision regarding hospitalization.   Level 2 Trauma.  Fall with head injury and facial laceration.  Unclear circumstances.  GCS 15 intact.  EKG is sinus rhythm, no Brugada, no prolonged QT.  CT head, face and C-spine negative for acute traumatic injury.  EKG is normal sinus rhythm without evidence of Brugada.  Concern for cardiogenic syncope.  She does have slurred speech and does take multiple sedating medications including gabapentin , zyprexa , seroquel , and methadone .  Laceration repaired as above.  Tetanus up-to-date.  Unclear etiology of her fall versus possible syncope.  May be due to polypharmacy and sedating medications.  Concern For possible cardiogenic syncope with lack of prodrome.  She does take multiple sedating medications including methadone , Seroquel , Zyprexa , Neurontin .  Trauma workup is reassuring.  Laceration repaired as above.  Tetanus is up-to-date. Plan observation admission for high risk syncope given lack of prodrome.  EKG without acute arrhythmia.  Admission discussed with Dr. Charlton.       Final diagnoses:  None    ED Discharge Orders     None          Iam Lipson, Garnette, MD 08/30/24 [phone]

## 2024-08-30 NOTE — ED Notes (Signed)
 Patient transported to CT with Autumn, TRN

## 2024-08-31 DIAGNOSIS — W19XXXA Unspecified fall, initial encounter: Secondary | ICD-10-CM | POA: Diagnosis not present

## 2024-08-31 DIAGNOSIS — S0181XA Laceration without foreign body of other part of head, initial encounter: Secondary | ICD-10-CM | POA: Diagnosis not present

## 2024-08-31 LAB — BASIC METABOLIC PANEL WITH GFR
Anion gap: 8 (ref 5–15)
BUN: 10 mg/dL (ref 8–23)
CO2: 24 mmol/L (ref 22–32)
Calcium: 8.5 mg/dL — ABNORMAL LOW (ref 8.9–10.3)
Chloride: 106 mmol/L (ref 98–111)
Creatinine, Ser: 0.91 mg/dL (ref 0.44–1.00)
GFR, Estimated: >60 mL/min (ref >60)
Glucose, Bld: 168 mg/dL — ABNORMAL HIGH (ref 70–99)
Potassium: 4 mmol/L (ref 3.5–5.1)
Sodium: 138 mmol/L (ref 135–145)

## 2024-08-31 LAB — CBC
HCT: 34.9 % — ABNORMAL LOW (ref 36.0–46.0)
Hemoglobin: 12 g/dL (ref 12.0–15.0)
MCH: 29.1 pg (ref 26.0–34.0)
MCHC: 34.4 g/dL (ref 30.0–36.0)
MCV: 84.7 fL (ref 80.0–100.0)
Platelets: 197 K/uL (ref 150–400)
RBC: 4.12 MIL/uL (ref 3.87–5.11)
RDW: 12.5 % (ref 11.5–15.5)
WBC: 4.8 K/uL (ref 4.0–10.5)
nRBC: 0 % (ref 0.0–0.2)

## 2024-08-31 NOTE — Care Management Obs Status (Signed)
 MEDICARE OBSERVATION STATUS NOTIFICATION   Patient Details  Name: AGAPITA SAVARINO MRN: [id number] Date of Birth: Mar 15, 1961   Medicare Observation Status Notification Given:  Yes    Nena LITTIE Coffee, RN 08/31/2024, 10:28 AM

## 2024-08-31 NOTE — TOC Transition Note (Incomplete)
 Transition of Care [facility]) - Discharge Note   Patient Details  Name: Becky Wolfe MRN: [id number] Date of Birth: 1960-12-21  Transition of Care [facility]) CM/SW Contact:  Rosalva Jon Bloch, RN Phone Number: 08/31/2024, 12:22 PM   Clinical Narrative:    Patient will DC to: home Anticipated DC date: 08/31/2024 Family notified: yes Transport by: car  Presented after a fall and facial injury. Per MD patient ready for DC today. RN, patient, and patient's family notified of DC. Pt agreeable to home health services. Pt provided choice. [facility] selected. NCM made [facility] aware via HUB and accepted. Pt without RX med concerns. Post hospital f/u noted on AVS.   Husband to provide transportation to home.  RNCM will sign off for now as intervention is no longer needed. Please consult us  again if new needs arise.    Final next level of care: Home w Home Health Services Barriers to Discharge: No Barriers Identified   Patient Goals and CMS Choice     Choice offered to / list presented to : Patient      Discharge Placement     Discharge Plan and Services Additional resources added to the After Visit Summary for   In-house Referral: Clinical Social Work   Post Acute Care Choice: Home Health                    HH Arranged: PT, OT, Social Work [facility] Agency: [facility] Date [facility] Agency Contacted: 08/31/24 Time HH Agency Contacted: 1222 Representative spoke with at [facility] Agency: Darleene  Social Drivers of Health (SDOH) Interventions SDOH Screenings   Food Insecurity: No Food Insecurity (08/31/2024)  Housing: Low Risk  (08/31/2024)  Transportation Needs: No Transportation Needs (08/31/2024)  Utilities: Not At Risk (08/31/2024)  Depression (PHQ2-9): Medium Risk (07/03/2020)  Financial Resource Strain: Low Risk  (07/03/2020)  Physical Activity: Inactive (07/03/2020)  Social Connections: Unknown (03/12/2022)   Received from [facility]  Stress: Stress Concern Present  (07/03/2020)  Tobacco Use: High Risk (08/30/2024)     Readmission Risk Interventions    02/16/2024    4:12 PM  Readmission Risk Prevention Plan  Transportation Screening Complete  PCP or Specialist Appt within 3-5 Days Complete  HRI or Home Care Consult Complete  Social Work Consult for Recovery Care Planning/Counseling Complete  Palliative Care Screening Not Applicable  Medication Review Oceanographer) Referral to Pharmacy

## 2024-08-31 NOTE — Progress Notes (Signed)
 Mobility Specialist Progress Note:    08/31/24 1200  Mobility  Activity Ambulated with assistance  Level of Assistance Contact guard assist, steadying assist  Assistive Device Four wheel walker  Distance Ambulated (ft) 50 ft (x2)  Activity Response Tolerated well  Mobility Referral Yes  Mobility visit 1 Mobility  Mobility Specialist Start Time (ACUTE ONLY) 1124  Mobility Specialist Stop Time (ACUTE ONLY) 1138  Mobility Specialist Time Calculation (min) (ACUTE ONLY) 14 min   Pt received in bed agreeable to mobility. No physical assistance required but a very close contact guard d/t unsteadiness and impulsivity. Returned to room w/o fault. Left in bed w/ call bell and personal belongings in reach. All needs met. Bed alarm on.  Thersia Minder Mobility Specialist  Please contact vis Secure Chat or  Rehab Office [phone]

## 2024-08-31 NOTE — Plan of Care (Signed)
  Problem: Activity: Goal: Risk for activity intolerance will decrease Outcome: Not Progressing   Problem: Coping: Goal: Level of anxiety will decrease Outcome: Not Progressing   Problem: Elimination: Goal: Will not experience complications related to bowel motility Outcome: Not Progressing   Problem: Pain Managment: Goal: General experience of comfort will improve and/or be controlled Outcome: Not Progressing   Problem: Safety: Goal: Ability to remain free from injury will improve Outcome: Not Progressing

## 2024-08-31 NOTE — Progress Notes (Signed)
 Physician Discharge Summary  Becky Wolfe FMW:[id number] DOB: [birth year] DOA: 08/30/2024  PCP: Loreli Kins, MD  Admit date: 08/30/2024 Discharge date: 08/31/2024  Admitted from: Home Discharge disposition: Home with home with PT OT  Recommendations at discharge:  Follow-up with her mental health provider as well as pain management provider to reduce the number and dose of neuropsych meds to minimize the risk of polypharmacy.  Subjective: Patient was seen and examined this morning. Walking around the hallway with mobility.  Not in distress.  Really wants to go home. Afebrile, hemodynamically stable, breathing room air Labs this morning with glucose level 168, otherwise unremarkable  Brief narrative: Becky Wolfe is a 63 y.o. female with PMH significant for multiple sclerosis, treated hep C, liver cirrhosis, HTN, TIA on aspirin  and Plavix , h/o DVT no longer anticoagulation, chronic pain, anxiety, paranoia Last night, patient was brought to the ED from home with fall and facial injury. Per report, patient lost her balance, fell and hit her face against the dresser sustaining right cheek laceration/swelling, does not recall losing consciousness, or having any prodromal symptoms of chest pain, palpitation, lightheadedness. She does not drink alcohol  and has not had any recent addition of new medication or change in doses.  In the ED, patient was afebrile, hemodynamically stable, breathing on room air Labs with normal WBC, normal renal function, hemoglobin 11.5, slightly elevated transaminases EKG showed sinus rhythm with normal intervals and no blocks.   CT head, CT maxillofacial, CT cervical spine, chest x-ray unremarkable Patient laceration was sutured. Admitted to [facility]: Fall Impaired mobility ??? Multiple sclerosis  Presented to ED after a fall.  Denies any premonition symptoms of lightheadedness, chest discomfort, palpitation, LOC. It is not clear if  patient has a diagnosis of multiple sclerosis.  She confirmed that she follows up with neurologist Dr. Leatrice but denies a clear diagnosis of multiple sclerosis.  I do not see any immunosuppressive medicine and her list of home meds.  She has slurred speech which she says is her baseline. Patient states that she uses walker to get around.  She is not sure how she ended up falling on the night of presentation.   She is also on multiple pain meds and neuropsych meds on a regular basis.  I think polypharmacy should be a concern. Seen by PT.  Given her unsteadiness, recommended SNF. Social worker discussed with patient and her husband.  Both are rather preferring home health PT and refusing SNF.  Home with PT ordered for discharge today  Facial laceration  Repaired with sutures in ED No underlying fracture on CT  Follow-up for suture removal in 5 days       Depression, anxiety, paranoia  States she follows up with psychiatrist at [facility].   PTA meds- Wellbutrin , Zyprexa , Seroquel , Zoloft , and as-needed Klonopin    Currently continued on all. I have counseled her to follow-up with her mental health provider to reduce the number and dose of pills to minimize the risk of polypharmacy.   Chronic pain Reflex sympathetic dystrophy On Percocet 10/325 4 times daily, Neurontin  400 mg as daily. Denies being on methadone .   Cirrhosis  Appears compensated, followed by Dr. Burnette     Hypertension  Blood pressure stable on clonidine  0.1 mg twice daily   H/o TIA  Continue aspirin  81 mg daily and Plavix  75 mg daily  Goals of care   Code Status: Full Code   Diet:  Diet Order  Diet general           Diet regular Room service appropriate? Yes; Fluid consistency: Thin  Diet effective now                   Nutritional status:  Body mass index is 29.28 kg/m.       Wounds:  - Wound 08/30/24 1700 Traumatic Face Right (Active)  Date First Assessed/Time First Assessed:  08/30/24 1700   Present on Original Admission: Yes  Primary Wound Type: Traumatic  Secondary Wound Type - Traumatic: Abrasion  Location: Face  Location Orientation: Right    Assessments 08/30/2024  5:00 PM 08/31/2024 10:46 AM  Site / Wound Assessment Clean;Dry;Purple;Painful Dry;Clean  Peri-wound Assessment Pink Erythema (blanchable)  Drainage Description -- No odor  Drainage Amount -- None  Dressing Type -- None     No associated orders.    Discharge Medications:   Allergies as of 08/31/2024       Reactions   Etodolac Swelling, Other (See Comments)   Facial swelling   Ambien  [zolpidem  Tartrate] Other (See Comments)   Headaches, memory loss   Namenda [memantine] Other (See Comments)   Headaches         Medication List     TAKE these medications    aspirin  81 MG chewable tablet Chew 81 mg by mouth daily.   buPROPion  150 MG 24 hr tablet Commonly known as: WELLBUTRIN  XL Take 150 mg by mouth daily.   clonazePAM  0.5 MG tablet Commonly known as: KLONOPIN  TAKE 1 TABLET(0.5 MG) BY MOUTH TWICE DAILY AS NEEDED FOR ANXIETY   cloNIDine  0.1 MG tablet Commonly known as: CATAPRES  Take 1 tablet (0.1 mg total) by mouth 2 (two) times daily.   clopidogrel  75 MG tablet Commonly known as: PLAVIX  TAKE 1 TABLET(75 MG) BY MOUTH DAILY   COLLAGEN PO Take 1 Dose by mouth daily.   gabapentin  400 MG capsule Commonly known as: NEURONTIN  Take 1 capsule (400 mg total) by mouth 4 (four) times daily. What changed: when to take this   hydrOXYzine  25 MG tablet Commonly known as: ATARAX  Take 1 tablet (25 mg total) by mouth 3 (three) times daily as needed.   OLANZapine  20 MG tablet Commonly known as: ZYPREXA  Take 1 tablet (20 mg total) by mouth at bedtime.   oxyCODONE -acetaminophen  10-325 MG tablet Commonly known as: PERCOCET Take 1 tablet by mouth 4 (four) times daily as needed for pain.   QUEtiapine  400 MG 24 hr tablet Commonly known as: SEROQUEL  XR Take 2 tablets (800 mg total)  by mouth at bedtime.   sertraline  100 MG tablet Commonly known as: ZOLOFT  TAKE 2 TABLETS(200 MG) BY MOUTH DAILY               Discharge Care Instructions  (From admission, onward)           Start     Ordered   08/31/24 0000  Discharge wound care:        08/31/24 1303             Follow ups:    Contact information for follow-up providers     Loreli Kins, MD Follow up.   Specialty: Family Medicine Contact information: [address] [place] [address] [phone]              Contact information for after-discharge care     Home Medical Care     [facility] .   Service:  Home Health Services Contact information: [address] [place]  [address] [phone]                     Discharge Instructions:   Discharge Instructions     Call MD for:  difficulty breathing, headache or visual disturbances   Complete by: As directed    Call MD for:  extreme fatigue   Complete by: As directed    Call MD for:  hives   Complete by: As directed    Call MD for:  persistant dizziness or light-headedness   Complete by: As directed    Call MD for:  persistant nausea and vomiting   Complete by: As directed    Call MD for:  severe uncontrolled pain   Complete by: As directed    Call MD for:  temperature >100.4   Complete by: As directed    Diet general   Complete by: As directed    Discharge instructions   Complete by: As directed    follow-up with her mental health provider to reduce the number and dose of pills to minimize the risk of polypharmacy.  General discharge instructions: Follow with Primary MD Loreli Kins, MD in 7 days  Please request your PCP  to go over your hospital tests, procedures, radiology results at the follow up. Please get your medicines reviewed and adjusted.  Your PCP may decide to repeat certain labs or tests as needed. Do not drive, operate  heavy machinery, perform activities at heights, swimming or participation in water activities or provide baby sitting services if your were admitted for syncope or siezures until you have seen by Primary MD or a Neurologist and advised to do so again. [place]  Controlled Substance Reporting System database was reviewed. Do not drive, operate heavy machinery, perform activities at heights, swim, participate in water activities or provide baby-sitting services while on medications for pain, sleep and mood until your outpatient physician has reevaluated you and advised to do so again.  You are strongly recommended to comply with the dose, frequency and duration of prescribed medications. Activity: As tolerated with Full fall precautions use walker/cane & assistance as needed Avoid using any recreational substances like cigarette, tobacco, alcohol , or non-prescribed drug. If you experience worsening of your admission symptoms, develop shortness of breath, life threatening emergency, suicidal or homicidal thoughts you must seek medical attention immediately by calling 911 or calling your MD immediately  if symptoms less severe. You must read complete instructions/literature along with all the possible adverse reactions/side effects for all the medicines you take and that have been prescribed to you. Take any new medicine only after you have completely understood and accepted all the possible adverse reactions/side effects.  Wear Seat belts while driving. You were cared for by a hospitalist during your hospital stay. If you have any questions about your discharge medications or the care you received while you were in the hospital after you are discharged, you can call the unit and ask to speak with the hospitalist or the covering physician. Once you are discharged, your primary care physician will handle any further medical issues. Please note that NO REFILLS for any discharge medications will be authorized  once you are discharged, as it is imperative that you return to your primary care physician (or establish a relationship with a primary care physician if you do not have one).   Discharge wound care:   Complete by: As directed  Increase activity slowly   Complete by: As directed        Discharge Exam:   Vitals:   08/30/24 1552 08/30/24 2005 08/31/24 0306 08/31/24 0745  BP:  (!) 153/79 121/65 130/82  Pulse:  85 86 75  Resp:  17 18 16   Temp: 97.6 F (36.4 C) 98.5 F (36.9 C) 98.8 F (37.1 C) 98.2 F (36.8 C)  TempSrc: Oral Oral Oral Oral  SpO2:  94% 96% 96%  Weight:      Height:        Body mass index is 29.28 kg/m.  General exam: Pleasant, middle-aged African-American female Skin: No rashes, lesions or ulcers. HEENT: Atraumatic, normocephalic, no obvious bleeding Lungs: Clear to auscultation bilaterally,  CVS: S1, S2, no murmur,   GI/Abd: Soft, nontender, nondistended, bowel sound present,   CNS: Alert, awake, oriented x 3.  Speech slurred at baseline Psychiatry: Mood appropriate Extremities: No pedal edema, no calf tenderness,    The results of significant diagnostics from this hospitalization (including imaging, microbiology, ancillary and laboratory) are listed below for reference.    Procedures and Diagnostic Studies:   CT Head Wo Contrast Result Date: 08/30/2024 EXAM: CT HEAD, FACIAL BONES AND CERVICAL SPINE WITHOUT CONTRAST 08/30/2024 01:13:48 AM TECHNIQUE: CT of the head, facial bones and cervical spine was performed without the administration of intravenous contrast. Multiplanar reformatted images are provided for review. Automated exposure control, iterative reconstruction, and/or weight based adjustment of the mA/kV was utilized to reduce the radiation dose to as low as reasonably achievable. COMPARISON: CT head and c-spine 12/22/2023, CT head 06/01/2024. CLINICAL HISTORY: Head trauma, moderate-severe. Patient lost her balance and fell at home this evening,  hit her face against the dresser, presents with right cheek laceration/swelling. She cannot recall if she lost consciousness. Takes Plavix . FINDINGS: CT HEAD BRAIN AND VENTRICLES: Patchy and confluent areas of decreased attenuation are noted throughout the deep and periventricular white matter of the cerebral hemispheres bilaterally suggestive of chronic microvascular ischemic changes. Atherosclerotic calcifications are present within the cavernous internal carotid arteries. No acute intracranial hemorrhage. No mass effect or midline shift. No extra-axial fluid collection. No evidence of acute infarct. No hydrocephalus. SKULL AND SCALP: No acute skull fracture. No scalp hematoma. CT FACIAL BONES FACIAL BONES: Multiple caries are noted. Periapical lucencies noted along several maxillary teeth. No acute facial fracture. No mandibular dislocation. No suspicious bone lesion. ORBITS: No acute traumatic injury. SINUSES AND MASTOIDS: Partial left mastoid effusion. The visualized paranasal sinuses are clear. SOFT TISSUES: No acute abnormality. CT CERVICAL SPINE BONES AND ALIGNMENT: C4-C5 and C7-T1 anterior discectomy and fusion surgical hardware. C5-C6 and C6-C7 interbody surgical hardware. No acute fracture or traumatic malalignment. DEGENERATIVE CHANGES: Mild degenerative changes of the spine. SOFT TISSUES: No prevertebral soft tissue swelling. IMPRESSION: 1. No acute intracranial abnormality. 2. No acute facial bone fracture. 3. No acute fracture or traumatic malalignment of the cervical spine. 4. Partial left mastoid effusion. Electronically signed by: Morgane Naveau MD 08/30/2024 01:25 AM EDT RP Workstation: [id number]   CT Maxillofacial Wo Contrast Result Date: 08/30/2024 EXAM: CT HEAD, FACIAL BONES AND CERVICAL SPINE WITHOUT CONTRAST 08/30/2024 01:13:48 AM TECHNIQUE: CT of the head, facial bones and cervical spine was performed without the administration of intravenous contrast. Multiplanar reformatted images are  provided for review. Automated exposure control, iterative reconstruction, and/or weight based adjustment of the mA/kV was utilized to reduce the radiation dose to as low as reasonably achievable. COMPARISON: CT head and c-spine 12/22/2023, CT head 06/01/2024. CLINICAL HISTORY:  Head trauma, moderate-severe. Patient lost her balance and fell at home this evening, hit her face against the dresser, presents with right cheek laceration/swelling. She cannot recall if she lost consciousness. Takes Plavix . FINDINGS: CT HEAD BRAIN AND VENTRICLES: Patchy and confluent areas of decreased attenuation are noted throughout the deep and periventricular white matter of the cerebral hemispheres bilaterally suggestive of chronic microvascular ischemic changes. Atherosclerotic calcifications are present within the cavernous internal carotid arteries. No acute intracranial hemorrhage. No mass effect or midline shift. No extra-axial fluid collection. No evidence of acute infarct. No hydrocephalus. SKULL AND SCALP: No acute skull fracture. No scalp hematoma. CT FACIAL BONES FACIAL BONES: Multiple caries are noted. Periapical lucencies noted along several maxillary teeth. No acute facial fracture. No mandibular dislocation. No suspicious bone lesion. ORBITS: No acute traumatic injury. SINUSES AND MASTOIDS: Partial left mastoid effusion. The visualized paranasal sinuses are clear. SOFT TISSUES: No acute abnormality. CT CERVICAL SPINE BONES AND ALIGNMENT: C4-C5 and C7-T1 anterior discectomy and fusion surgical hardware. C5-C6 and C6-C7 interbody surgical hardware. No acute fracture or traumatic malalignment. DEGENERATIVE CHANGES: Mild degenerative changes of the spine. SOFT TISSUES: No prevertebral soft tissue swelling. IMPRESSION: 1. No acute intracranial abnormality. 2. No acute facial bone fracture. 3. No acute fracture or traumatic malalignment of the cervical spine. 4. Partial left mastoid effusion. Electronically signed by: Morgane  Naveau MD 08/30/2024 01:25 AM EDT RP Workstation: [id number]   CT Cervical Spine Wo Contrast Result Date: 08/30/2024 EXAM: CT HEAD, FACIAL BONES AND CERVICAL SPINE WITHOUT CONTRAST 08/30/2024 01:13:48 AM TECHNIQUE: CT of the head, facial bones and cervical spine was performed without the administration of intravenous contrast. Multiplanar reformatted images are provided for review. Automated exposure control, iterative reconstruction, and/or weight based adjustment of the mA/kV was utilized to reduce the radiation dose to as low as reasonably achievable. COMPARISON: CT head and c-spine 12/22/2023, CT head 06/01/2024. CLINICAL HISTORY: Head trauma, moderate-severe. Patient lost her balance and fell at home this evening, hit her face against the dresser, presents with right cheek laceration/swelling. She cannot recall if she lost consciousness. Takes Plavix . FINDINGS: CT HEAD BRAIN AND VENTRICLES: Patchy and confluent areas of decreased attenuation are noted throughout the deep and periventricular white matter of the cerebral hemispheres bilaterally suggestive of chronic microvascular ischemic changes. Atherosclerotic calcifications are present within the cavernous internal carotid arteries. No acute intracranial hemorrhage. No mass effect or midline shift. No extra-axial fluid collection. No evidence of acute infarct. No hydrocephalus. SKULL AND SCALP: No acute skull fracture. No scalp hematoma. CT FACIAL BONES FACIAL BONES: Multiple caries are noted. Periapical lucencies noted along several maxillary teeth. No acute facial fracture. No mandibular dislocation. No suspicious bone lesion. ORBITS: No acute traumatic injury. SINUSES AND MASTOIDS: Partial left mastoid effusion. The visualized paranasal sinuses are clear. SOFT TISSUES: No acute abnormality. CT CERVICAL SPINE BONES AND ALIGNMENT: C4-C5 and C7-T1 anterior discectomy and fusion surgical hardware. C5-C6 and C6-C7 interbody surgical hardware. No acute  fracture or traumatic malalignment. DEGENERATIVE CHANGES: Mild degenerative changes of the spine. SOFT TISSUES: No prevertebral soft tissue swelling. IMPRESSION: 1. No acute intracranial abnormality. 2. No acute facial bone fracture. 3. No acute fracture or traumatic malalignment of the cervical spine. 4. Partial left mastoid effusion. Electronically signed by: Morgane Naveau MD 08/30/2024 01:25 AM EDT RP Workstation: [id number]   DG Chest Portable 1 View Result Date: 08/30/2024 EXAM: 1 VIEW XRAY OF THE CHEST 08/30/2024 12:59:53 AM COMPARISON: Chest x-ray 05/17/2023, CT chest 06/24/2023. CLINICAL HISTORY: Fall. Patient lost her balance  and fell at home this evening, hit her face against the dresser, presents with right cheek laceration/swelling. She cannot recall if she lost consciousness. Takes Plavix . FINDINGS: LUNGS AND PLEURA: No focal pulmonary opacity. No pulmonary edema. No pleural effusion. No pneumothorax. HEART AND MEDIASTINUM: No acute abnormality of the cardiac and mediastinal silhouettes. BONES AND SOFT TISSUES: Cervical thoracic spine surgical hardware. No acute osseous abnormality. IMPRESSION: 1. No acute cardiopulmonary process. Electronically signed by: Kate Plummer MD 08/30/2024 01:08 AM EDT RP Workstation: [id number]     Labs:   Basic Metabolic Panel: Recent Labs  Lab 08/30/24 0058 08/30/24 0104 08/30/24 0401 08/31/24 0500  NA 138 143 140 138  K 3.9 3.8 3.8 4.0  CL 108 109 107 106  CO2 20*  --  22 24  GLUCOSE 123* 119* 114* 168*  BUN 14 14 13 10   CREATININE 0.93 0.90 0.86 0.91  CALCIUM 9.0  --  8.9 8.5*   GFR Estimated Creatinine Clearance: 53.7 mL/min (by C-G formula based on SCr of 0.91 mg/dL). Liver Function Tests: Recent Labs  Lab 08/30/24 0058  AST 49*  ALT 50*  ALKPHOS 96  BILITOT 0.7  PROT 7.0  ALBUMIN 3.8   No results for input(s): LIPASE, AMYLASE in the last 168 hours. No results for input(s): AMMONIA  in the last 168 hours. Coagulation  profile No results for input(s): INR, PROTIME in the last 168 hours.  CBC: Recent Labs  Lab 08/30/24 0058 08/30/24 0104 08/30/24 0401 08/31/24 0500  WBC 6.3  --  6.0 4.8  NEUTROABS 4.2  --   --   --   HGB 11.5* 11.2* 11.4* 12.0  HCT 33.6* 33.0* 33.6* 34.9*  MCV 85.1  --  84.8 84.7  PLT 184  --  188 197   Cardiac Enzymes: No results for input(s): CKTOTAL, CKMB, CKMBINDEX, TROPONINI in the last 168 hours. BNP: Invalid input(s): POCBNP CBG: No results for input(s): GLUCAP in the last 168 hours. D-Dimer No results for input(s): DDIMER in the last 72 hours. Hgb A1c No results for input(s): HGBA1C in the last 72 hours. Lipid Profile No results for input(s): CHOL, HDL, LDLCALC, TRIG, CHOLHDL, LDLDIRECT in the last 72 hours. Thyroid  function studies No results for input(s): TSH, T4TOTAL, T3FREE, THYROIDAB in the last 72 hours.  Invalid input(s): FREET3 Anemia work up No results for input(s): VITAMINB12, FOLATE, FERRITIN, TIBC, IRON, RETICCTPCT in the last 72 hours. Microbiology No results found for this or any previous visit (from the past 240 hours).  Time coordinating discharge: 45 minutes  Signed: Aanya Haynes  [facility] 08/31/2024, 1:03 PM

## 2024-08-31 NOTE — Discharge Summary (Signed)
 Physician Discharge Summary  ANTONIETTE PEAKE FMW:[id number] DOB: Apr 18, 1961 DOA: 08/30/2024  PCP: Loreli Kins, MD  Admit date: 08/30/2024 Discharge date: 08/31/2024  Admitted from: Home Discharge disposition: Home with home with PT OT  Recommendations at discharge:  Follow-up with her mental health provider as well as pain management provider to reduce the number and dose of neuropsych meds to minimize the risk of polypharmacy.  Subjective: Patient was seen and examined this morning. Walking around the hallway with mobility.  Not in distress.  Really wants to go home. Afebrile, hemodynamically stable, breathing room air Labs this morning with glucose level 168, otherwise unremarkable  Brief narrative: Becky Wolfe is a 63 y.o. female with PMH significant for multiple sclerosis, treated hep C, liver cirrhosis, HTN, TIA on aspirin  and Plavix , h/o DVT no longer anticoagulation, chronic pain, anxiety, paranoia Last night, patient was brought to the ED from home with fall and facial injury. Per report, patient lost her balance, fell and hit her face against the dresser sustaining right cheek laceration/swelling, does not recall losing consciousness, or having any prodromal symptoms of chest pain, palpitation, lightheadedness. She does not drink alcohol  and has not had any recent addition of new medication or change in doses.  In the ED, patient was afebrile, hemodynamically stable, breathing on room air Labs with normal WBC, normal renal function, hemoglobin 11.5, slightly elevated transaminases EKG showed sinus rhythm with normal intervals and no blocks.   CT head, CT maxillofacial, CT cervical spine, chest x-ray unremarkable Patient laceration was sutured. Admitted to [facility] course: Fall Impaired mobility ??? Multiple sclerosis  Presented to ED after a fall.  Denies any premonition symptoms of lightheadedness, chest discomfort, palpitation, LOC. It is not clear if  patient has a diagnosis of multiple sclerosis.  She confirmed that she follows up with neurologist Dr. Leatrice but denies a clear diagnosis of multiple sclerosis.  I do not see any immunosuppressive medicine and her list of home meds.  She has slurred speech which she says is her baseline. Patient states that she uses walker to get around.  She is not sure how she ended up falling on the night of presentation.   She is also on multiple pain meds and neuropsych meds on a regular basis.  I think polypharmacy should be a concern. Seen by PT.  Given her unsteadiness, recommended SNF. Social worker discussed with patient and her husband.  Both are rather preferring home health PT and refusing SNF.  Home with PT ordered for discharge today  Facial laceration  Repaired with sutures in ED No underlying fracture on CT  Follow-up for suture removal in 5 days       Depression, anxiety, paranoia  States she follows up with psychiatrist at [facility].   PTA meds- Wellbutrin , Zyprexa , Seroquel , Zoloft , and as-needed Klonopin    Currently continued on all. I have counseled her to follow-up with her mental health provider to reduce the number and dose of pills to minimize the risk of polypharmacy.   Chronic pain Reflex sympathetic dystrophy On Percocet 10/325 4 times daily, Neurontin  400 mg as daily. Denies being on methadone .   Cirrhosis  Appears compensated, followed by Dr. Burnette     Hypertension  Blood pressure stable on clonidine  0.1 mg twice daily   H/o TIA  Continue aspirin  81 mg daily and Plavix  75 mg daily  Goals of care   Code Status: Full Code   Diet:  Diet Order  Diet general           Diet regular Room service appropriate? Yes; Fluid consistency: Thin  Diet effective now                   Nutritional status:  Body mass index is 29.28 kg/m.       Wounds:  - Wound 08/30/24 1700 Traumatic Face Right (Active)  Date First Assessed/Time First Assessed:  08/30/24 1700   Present on Original Admission: Yes  Primary Wound Type: Traumatic  Secondary Wound Type - Traumatic: Abrasion  Location: Face  Location Orientation: Right    Assessments 08/30/2024  5:00 PM 08/31/2024 10:46 AM  Site / Wound Assessment Clean;Dry;Purple;Painful Dry;Clean  Peri-wound Assessment Pink Erythema (blanchable)  Drainage Description -- No odor  Drainage Amount -- None  Dressing Type -- None     No associated orders.    Discharge Medications:   Allergies as of 08/31/2024       Reactions   Etodolac Swelling, Other (See Comments)   Facial swelling   Ambien  [zolpidem  Tartrate] Other (See Comments)   Headaches, memory loss   Namenda [memantine] Other (See Comments)   Headaches         Medication List     TAKE these medications    aspirin  81 MG chewable tablet Chew 81 mg by mouth daily.   buPROPion  150 MG 24 hr tablet Commonly known as: WELLBUTRIN  XL Take 150 mg by mouth daily.   clonazePAM  0.5 MG tablet Commonly known as: KLONOPIN  TAKE 1 TABLET(0.5 MG) BY MOUTH TWICE DAILY AS NEEDED FOR ANXIETY   cloNIDine  0.1 MG tablet Commonly known as: CATAPRES  Take 1 tablet (0.1 mg total) by mouth 2 (two) times daily.   clopidogrel  75 MG tablet Commonly known as: PLAVIX  TAKE 1 TABLET(75 MG) BY MOUTH DAILY   COLLAGEN PO Take 1 Dose by mouth daily.   gabapentin  400 MG capsule Commonly known as: NEURONTIN  Take 1 capsule (400 mg total) by mouth 4 (four) times daily. What changed: when to take this   hydrOXYzine  25 MG tablet Commonly known as: ATARAX  Take 1 tablet (25 mg total) by mouth 3 (three) times daily as needed.   OLANZapine  20 MG tablet Commonly known as: ZYPREXA  Take 1 tablet (20 mg total) by mouth at bedtime.   oxyCODONE -acetaminophen  10-325 MG tablet Commonly known as: PERCOCET Take 1 tablet by mouth 4 (four) times daily as needed for pain.   QUEtiapine  400 MG 24 hr tablet Commonly known as: SEROQUEL  XR Take 2 tablets (800 mg total)  by mouth at bedtime.   sertraline  100 MG tablet Commonly known as: ZOLOFT  TAKE 2 TABLETS(200 MG) BY MOUTH DAILY               Discharge Care Instructions  (From admission, onward)           Start     Ordered   08/31/24 0000  Discharge wound care:        08/31/24 1303             Follow ups:    Contact information for follow-up providers     Loreli Kins, MD Follow up.   Specialty: Family Medicine Contact information: [address] [place] [address] [phone]              Contact information for after-discharge care     Home Medical Care     [facility]) .   Service:  Home Health Services Contact information: [address] [place]  [address] [phone]                     Discharge Instructions:   Discharge Instructions     Call MD for:  difficulty breathing, headache or visual disturbances   Complete by: As directed    Call MD for:  extreme fatigue   Complete by: As directed    Call MD for:  hives   Complete by: As directed    Call MD for:  persistant dizziness or light-headedness   Complete by: As directed    Call MD for:  persistant nausea and vomiting   Complete by: As directed    Call MD for:  severe uncontrolled pain   Complete by: As directed    Call MD for:  temperature >100.4   Complete by: As directed    Diet general   Complete by: As directed    Discharge instructions   Complete by: As directed    follow-up with her mental health provider to reduce the number and dose of pills to minimize the risk of polypharmacy.  General discharge instructions: Follow with Primary MD Loreli Kins, MD in 7 days  Please request your PCP  to go over your hospital tests, procedures, radiology results at the follow up. Please get your medicines reviewed and adjusted.  Your PCP may decide to repeat certain labs or tests as needed. Do not drive, operate  heavy machinery, perform activities at heights, swimming or participation in water activities or provide baby sitting services if your were admitted for syncope or siezures until you have seen by Primary MD or a Neurologist and advised to do so again. [place]  Controlled Substance Reporting System database was reviewed. Do not drive, operate heavy machinery, perform activities at heights, swim, participate in water activities or provide baby-sitting services while on medications for pain, sleep and mood until your outpatient physician has reevaluated you and advised to do so again.  You are strongly recommended to comply with the dose, frequency and duration of prescribed medications. Activity: As tolerated with Full fall precautions use walker/cane & assistance as needed Avoid using any recreational substances like cigarette, tobacco, alcohol , or non-prescribed drug. If you experience worsening of your admission symptoms, develop shortness of breath, life threatening emergency, suicidal or homicidal thoughts you must seek medical attention immediately by calling 911 or calling your MD immediately  if symptoms less severe. You must read complete instructions/literature along with all the possible adverse reactions/side effects for all the medicines you take and that have been prescribed to you. Take any new medicine only after you have completely understood and accepted all the possible adverse reactions/side effects.  Wear Seat belts while driving. You were cared for by a hospitalist during your hospital stay. If you have any questions about your discharge medications or the care you received while you were in the hospital after you are discharged, you can call the unit and ask to speak with the hospitalist or the covering physician. Once you are discharged, your primary care physician will handle any further medical issues. Please note that NO REFILLS for any discharge medications will be authorized  once you are discharged, as it is imperative that you return to your primary care physician (or establish a relationship with a primary care physician if you do not have one).   Discharge wound care:   Complete by: As directed  Increase activity slowly   Complete by: As directed        Discharge Exam:   Vitals:   08/30/24 1552 08/30/24 2005 08/31/24 0306 08/31/24 0745  BP:  (!) 153/79 121/65 130/82  Pulse:  85 86 75  Resp:  17 18 16   Temp: 97.6 F (36.4 C) 98.5 F (36.9 C) 98.8 F (37.1 C) 98.2 F (36.8 C)  TempSrc: Oral Oral Oral Oral  SpO2:  94% 96% 96%  Weight:      Height:        Body mass index is 29.28 kg/m.  General exam: Pleasant, middle-aged African-American female Skin: No rashes, lesions or ulcers. HEENT: Atraumatic, normocephalic, no obvious bleeding Lungs: Clear to auscultation bilaterally,  CVS: S1, S2, no murmur,   GI/Abd: Soft, nontender, nondistended, bowel sound present,   CNS: Alert, awake, oriented x 3.  Speech slurred at baseline Psychiatry: Mood appropriate Extremities: No pedal edema, no calf tenderness,    The results of significant diagnostics from this hospitalization (including imaging, microbiology, ancillary and laboratory) are listed below for reference.    Procedures and Diagnostic Studies:   CT Head Wo Contrast Result Date: 08/30/2024 EXAM: CT HEAD, FACIAL BONES AND CERVICAL SPINE WITHOUT CONTRAST 08/30/2024 01:13:48 AM TECHNIQUE: CT of the head, facial bones and cervical spine was performed without the administration of intravenous contrast. Multiplanar reformatted images are provided for review. Automated exposure control, iterative reconstruction, and/or weight based adjustment of the mA/kV was utilized to reduce the radiation dose to as low as reasonably achievable. COMPARISON: CT head and c-spine 12/22/2023, CT head 06/01/2024. CLINICAL HISTORY: Head trauma, moderate-severe. Patient lost her balance and fell at home this evening,  hit her face against the dresser, presents with right cheek laceration/swelling. She cannot recall if she lost consciousness. Takes Plavix . FINDINGS: CT HEAD BRAIN AND VENTRICLES: Patchy and confluent areas of decreased attenuation are noted throughout the deep and periventricular white matter of the cerebral hemispheres bilaterally suggestive of chronic microvascular ischemic changes. Atherosclerotic calcifications are present within the cavernous internal carotid arteries. No acute intracranial hemorrhage. No mass effect or midline shift. No extra-axial fluid collection. No evidence of acute infarct. No hydrocephalus. SKULL AND SCALP: No acute skull fracture. No scalp hematoma. CT FACIAL BONES FACIAL BONES: Multiple caries are noted. Periapical lucencies noted along several maxillary teeth. No acute facial fracture. No mandibular dislocation. No suspicious bone lesion. ORBITS: No acute traumatic injury. SINUSES AND MASTOIDS: Partial left mastoid effusion. The visualized paranasal sinuses are clear. SOFT TISSUES: No acute abnormality. CT CERVICAL SPINE BONES AND ALIGNMENT: C4-C5 and C7-T1 anterior discectomy and fusion surgical hardware. C5-C6 and C6-C7 interbody surgical hardware. No acute fracture or traumatic malalignment. DEGENERATIVE CHANGES: Mild degenerative changes of the spine. SOFT TISSUES: No prevertebral soft tissue swelling. IMPRESSION: 1. No acute intracranial abnormality. 2. No acute facial bone fracture. 3. No acute fracture or traumatic malalignment of the cervical spine. 4. Partial left mastoid effusion. Electronically signed by: Morgane Naveau MD 08/30/2024 01:25 AM EDT RP Workstation: [id number]   CT Maxillofacial Wo Contrast Result Date: 08/30/2024 EXAM: CT HEAD, FACIAL BONES AND CERVICAL SPINE WITHOUT CONTRAST 08/30/2024 01:13:48 AM TECHNIQUE: CT of the head, facial bones and cervical spine was performed without the administration of intravenous contrast. Multiplanar reformatted images are  provided for review. Automated exposure control, iterative reconstruction, and/or weight based adjustment of the mA/kV was utilized to reduce the radiation dose to as low as reasonably achievable. COMPARISON: CT head and c-spine 12/22/2023, CT head 06/01/2024. CLINICAL HISTORY:  Head trauma, moderate-severe. Patient lost her balance and fell at home this evening, hit her face against the dresser, presents with right cheek laceration/swelling. She cannot recall if she lost consciousness. Takes Plavix . FINDINGS: CT HEAD BRAIN AND VENTRICLES: Patchy and confluent areas of decreased attenuation are noted throughout the deep and periventricular white matter of the cerebral hemispheres bilaterally suggestive of chronic microvascular ischemic changes. Atherosclerotic calcifications are present within the cavernous internal carotid arteries. No acute intracranial hemorrhage. No mass effect or midline shift. No extra-axial fluid collection. No evidence of acute infarct. No hydrocephalus. SKULL AND SCALP: No acute skull fracture. No scalp hematoma. CT FACIAL BONES FACIAL BONES: Multiple caries are noted. Periapical lucencies noted along several maxillary teeth. No acute facial fracture. No mandibular dislocation. No suspicious bone lesion. ORBITS: No acute traumatic injury. SINUSES AND MASTOIDS: Partial left mastoid effusion. The visualized paranasal sinuses are clear. SOFT TISSUES: No acute abnormality. CT CERVICAL SPINE BONES AND ALIGNMENT: C4-C5 and C7-T1 anterior discectomy and fusion surgical hardware. C5-C6 and C6-C7 interbody surgical hardware. No acute fracture or traumatic malalignment. DEGENERATIVE CHANGES: Mild degenerative changes of the spine. SOFT TISSUES: No prevertebral soft tissue swelling. IMPRESSION: 1. No acute intracranial abnormality. 2. No acute facial bone fracture. 3. No acute fracture or traumatic malalignment of the cervical spine. 4. Partial left mastoid effusion. Electronically signed by: Morgane  Naveau MD 08/30/2024 01:25 AM EDT RP Workstation: [id number]   CT Cervical Spine Wo Contrast Result Date: 08/30/2024 EXAM: CT HEAD, FACIAL BONES AND CERVICAL SPINE WITHOUT CONTRAST 08/30/2024 01:13:48 AM TECHNIQUE: CT of the head, facial bones and cervical spine was performed without the administration of intravenous contrast. Multiplanar reformatted images are provided for review. Automated exposure control, iterative reconstruction, and/or weight based adjustment of the mA/kV was utilized to reduce the radiation dose to as low as reasonably achievable. COMPARISON: CT head and c-spine 12/22/2023, CT head 06/01/2024. CLINICAL HISTORY: Head trauma, moderate-severe. Patient lost her balance and fell at home this evening, hit her face against the dresser, presents with right cheek laceration/swelling. She cannot recall if she lost consciousness. Takes Plavix . FINDINGS: CT HEAD BRAIN AND VENTRICLES: Patchy and confluent areas of decreased attenuation are noted throughout the deep and periventricular white matter of the cerebral hemispheres bilaterally suggestive of chronic microvascular ischemic changes. Atherosclerotic calcifications are present within the cavernous internal carotid arteries. No acute intracranial hemorrhage. No mass effect or midline shift. No extra-axial fluid collection. No evidence of acute infarct. No hydrocephalus. SKULL AND SCALP: No acute skull fracture. No scalp hematoma. CT FACIAL BONES FACIAL BONES: Multiple caries are noted. Periapical lucencies noted along several maxillary teeth. No acute facial fracture. No mandibular dislocation. No suspicious bone lesion. ORBITS: No acute traumatic injury. SINUSES AND MASTOIDS: Partial left mastoid effusion. The visualized paranasal sinuses are clear. SOFT TISSUES: No acute abnormality. CT CERVICAL SPINE BONES AND ALIGNMENT: C4-C5 and C7-T1 anterior discectomy and fusion surgical hardware. C5-C6 and C6-C7 interbody surgical hardware. No acute  fracture or traumatic malalignment. DEGENERATIVE CHANGES: Mild degenerative changes of the spine. SOFT TISSUES: No prevertebral soft tissue swelling. IMPRESSION: 1. No acute intracranial abnormality. 2. No acute facial bone fracture. 3. No acute fracture or traumatic malalignment of the cervical spine. 4. Partial left mastoid effusion. Electronically signed by: Morgane Naveau MD 08/30/2024 01:25 AM EDT RP Workstation: [id number]   DG Chest Portable 1 View Result Date: 08/30/2024 EXAM: 1 VIEW XRAY OF THE CHEST 08/30/2024 12:59:53 AM COMPARISON: Chest x-ray 05/17/2023, CT chest 06/24/2023. CLINICAL HISTORY: Fall. Patient lost her balance  and fell at home this evening, hit her face against the dresser, presents with right cheek laceration/swelling. She cannot recall if she lost consciousness. Takes Plavix . FINDINGS: LUNGS AND PLEURA: No focal pulmonary opacity. No pulmonary edema. No pleural effusion. No pneumothorax. HEART AND MEDIASTINUM: No acute abnormality of the cardiac and mediastinal silhouettes. BONES AND SOFT TISSUES: Cervical thoracic spine surgical hardware. No acute osseous abnormality. IMPRESSION: 1. No acute cardiopulmonary process. Electronically signed by: Kate Plummer MD 08/30/2024 01:08 AM EDT RP Workstation: [id number]     Labs:   Basic Metabolic Panel: Recent Labs  Lab 08/30/24 0058 08/30/24 0104 08/30/24 0401 08/31/24 0500  NA 138 143 140 138  K 3.9 3.8 3.8 4.0  CL 108 109 107 106  CO2 20*  --  22 24  GLUCOSE 123* 119* 114* 168*  BUN 14 14 13 10   CREATININE 0.93 0.90 0.86 0.91  CALCIUM 9.0  --  8.9 8.5*   GFR Estimated Creatinine Clearance: 53.7 mL/min (by C-G formula based on SCr of 0.91 mg/dL). Liver Function Tests: Recent Labs  Lab 08/30/24 0058  AST 49*  ALT 50*  ALKPHOS 96  BILITOT 0.7  PROT 7.0  ALBUMIN 3.8   No results for input(s): LIPASE, AMYLASE in the last 168 hours. No results for input(s): AMMONIA  in the last 168 hours. Coagulation  profile No results for input(s): INR, PROTIME in the last 168 hours.  CBC: Recent Labs  Lab 08/30/24 0058 08/30/24 0104 08/30/24 0401 08/31/24 0500  WBC 6.3  --  6.0 4.8  NEUTROABS 4.2  --   --   --   HGB 11.5* 11.2* 11.4* 12.0  HCT 33.6* 33.0* 33.6* 34.9*  MCV 85.1  --  84.8 84.7  PLT 184  --  188 197   Cardiac Enzymes: No results for input(s): CKTOTAL, CKMB, CKMBINDEX, TROPONINI in the last 168 hours. BNP: Invalid input(s): POCBNP CBG: No results for input(s): GLUCAP in the last 168 hours. D-Dimer No results for input(s): DDIMER in the last 72 hours. Hgb A1c No results for input(s): HGBA1C in the last 72 hours. Lipid Profile No results for input(s): CHOL, HDL, LDLCALC, TRIG, CHOLHDL, LDLDIRECT in the last 72 hours. Thyroid  function studies No results for input(s): TSH, T4TOTAL, T3FREE, THYROIDAB in the last 72 hours.  Invalid input(s): FREET3 Anemia work up No results for input(s): VITAMINB12, FOLATE, FERRITIN, TIBC, IRON, RETICCTPCT in the last 72 hours. Microbiology No results found for this or any previous visit (from the past 240 hours).  Time coordinating discharge: 45 minutes  Signed: Amyria Komar  [facility] 08/31/2024, 1:04 PM

## 2024-09-03 ENCOUNTER — Encounter: Payer: Self-pay | Admitting: Physician Assistant

## 2024-09-03 ENCOUNTER — Ambulatory Visit (INDEPENDENT_AMBULATORY_CARE_PROVIDER_SITE_OTHER): Admitting: Physician Assistant

## 2024-09-03 ENCOUNTER — Other Ambulatory Visit: Payer: Self-pay | Admitting: Physician Assistant

## 2024-09-03 DIAGNOSIS — F411 Generalized anxiety disorder: Secondary | ICD-10-CM | POA: Diagnosis not present

## 2024-09-03 DIAGNOSIS — R296 Repeated falls: Secondary | ICD-10-CM

## 2024-09-03 DIAGNOSIS — R2689 Other abnormalities of gait and mobility: Secondary | ICD-10-CM | POA: Diagnosis not present

## 2024-09-03 DIAGNOSIS — R443 Hallucinations, unspecified: Secondary | ICD-10-CM

## 2024-09-03 DIAGNOSIS — F22 Delusional disorders: Secondary | ICD-10-CM

## 2024-09-03 MED ORDER — OLANZAPINE 20 MG PO TABS: 30.0000 mg | ORAL_TABLET | Freq: Every day | ORAL | 1 refills | Status: DC
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
## 2024-09-06 ENCOUNTER — Other Ambulatory Visit: Payer: Self-pay | Admitting: Neurology

## 2024-09-06 NOTE — Telephone Encounter (Signed)
 Last seen on 05/28/24 Follow up scheduled on 05/24/25   Was patient to continue medication?

## 2024-09-15 ENCOUNTER — Other Ambulatory Visit: Payer: Self-pay | Admitting: Behavioral Health

## 2024-09-15 ENCOUNTER — Telehealth: Payer: Self-pay | Admitting: Physician Assistant

## 2024-09-15 ENCOUNTER — Telehealth: Payer: Self-pay

## 2024-09-15 DIAGNOSIS — F411 Generalized anxiety disorder: Secondary | ICD-10-CM

## 2024-09-15 NOTE — Telephone Encounter (Signed)
 Referral was sent thru EPIC to [facility] Neurology to Camie Sevin, PA for eval of imbalance and falls.

## 2024-09-15 NOTE — Telephone Encounter (Signed)
 PA denied for Olanzapine  20 mg 1.5 tablets daily (30 mg), coverage for dose/quantity require support by manufacturer's prescribing information or one of the following medicare approved compendia: [facility] Formulary Service information or [employer] DRUGDEX

## 2024-09-17 NOTE — Telephone Encounter (Signed)
 The AH/VH and paranoia were slightly better at the last visit.  I will leave medication doses the same at this point.  If those symptoms worsen again or have not improved further by the next visit then I will change antipsychotics.

## 2024-09-21 ENCOUNTER — Other Ambulatory Visit: Payer: Self-pay | Admitting: Obstetrics and Gynecology

## 2024-09-21 DIAGNOSIS — Z8249 Family history of ischemic heart disease and other diseases of the circulatory system: Secondary | ICD-10-CM

## 2024-09-21 DIAGNOSIS — E041 Nontoxic single thyroid nodule: Secondary | ICD-10-CM

## 2024-09-26 ENCOUNTER — Emergency Department (HOSPITAL_BASED_OUTPATIENT_CLINIC_OR_DEPARTMENT_OTHER)
Admission: EM | Admit: 2024-09-26 | Discharge: 2024-09-26 | Disposition: A | Discharge status: Home / Self Care | Attending: Emergency Medicine | Admitting: Emergency Medicine

## 2024-09-26 ENCOUNTER — Encounter (HOSPITAL_BASED_OUTPATIENT_CLINIC_OR_DEPARTMENT_OTHER): Payer: Self-pay

## 2024-09-26 ENCOUNTER — Other Ambulatory Visit: Payer: Self-pay

## 2024-09-26 DIAGNOSIS — S01411D Laceration without foreign body of right cheek and temporomandibular area, subsequent encounter: Secondary | ICD-10-CM | POA: Diagnosis not present

## 2024-09-26 DIAGNOSIS — Z4802 Encounter for removal of sutures: Secondary | ICD-10-CM | POA: Diagnosis present

## 2024-09-26 DIAGNOSIS — X58XXXD Exposure to other specified factors, subsequent encounter: Secondary | ICD-10-CM | POA: Diagnosis not present

## 2024-09-26 DIAGNOSIS — Z7982 Long term (current) use of aspirin: Secondary | ICD-10-CM | POA: Diagnosis not present

## 2024-09-26 NOTE — ED Provider Notes (Signed)
 [place] [facility] Provider Note   CSN: [id number] Arrival date & time: 09/26/24  8361     Patient presents with: Suture / Staple Removal   Becky Wolfe is a 63 y.o. female.  Patient is a 63 year old female who presents to the ED for suture removal to a wound to the right cheek.  She notes they have been in there for approximately 2 weeks.  States she tried to remove them but was not able to get all of them out.  Denies any redness or drainage from the area.  Has not had any fevers or further difficulty since admission to the hospital.  No further complaints.  Suture / Staple Removal       Prior to Admission medications   Medication Sig Start Date End Date Taking? Authorizing Provider  aspirin  81 MG chewable tablet Chew 81 mg by mouth daily.    [provider]  buPROPion  (WELLBUTRIN  XL) 150 MG 24 hr tablet Take 150 mg by mouth daily.    [provider]  clonazePAM  (KLONOPIN ) 0.5 MG tablet TAKE 1 TABLET(0.5 MG) BY MOUTH TWICE DAILY AS NEEDED FOR ANXIETY 09/15/24   Hurst, Verneita T, PA-C  cloNIDine  (CATAPRES ) 0.1 MG tablet Take 1 tablet (0.1 mg total) by mouth 2 (two) times daily. 02/14/24   Cindy Garnette POUR, MD  clopidogrel  (PLAVIX ) 75 MG tablet TAKE 1 TABLET(75 MG) BY MOUTH DAILY 09/06/24   Sater, Charlie LABOR, MD  Collagen-Vitamin C-Biotin (COLLAGEN PO) Take 1 Dose by mouth daily.    [provider]  gabapentin  (NEURONTIN ) 400 MG capsule Take 1 capsule (400 mg total) by mouth 4 (four) times daily. 02/19/24   Rhys Verneita T, PA-C  hydrOXYzine  (ATARAX ) 25 MG tablet Take 1 tablet (25 mg total) by mouth 3 (three) times daily as needed. 06/22/24   Sater, Charlie LABOR, MD  OLANZapine  (ZYPREXA ) 20 MG tablet Take 1.5 tablets (30 mg total) by mouth at bedtime. 09/03/24   Rhys Verneita T, PA-C  oxyCODONE -acetaminophen  (PERCOCET) 10-325 MG tablet Take 1 tablet by mouth 4 (four) times daily as needed for pain. 07/21/24   [provider]  QUEtiapine  (SEROQUEL  XR) 400 MG 24 hr tablet Take 2 tablets (800 mg total) by mouth at bedtime. 05/11/24   Rhys Verneita T, PA-C  sertraline  (ZOLOFT ) 100 MG tablet TAKE 2 TABLETS(200 MG) BY MOUTH DAILY 05/11/24   Rhys Verneita T, PA-C    Allergies: Etodolac, Ambien  [zolpidem  tartrate], and Namenda [memantine]    Review of Systems  Skin:  Positive for wound.  All other systems reviewed and are negative.   Updated Vital Signs BP 120/60 (BP Location: Right Arm)   Pulse 65   Temp 98.3 F (36.8 C) (Oral)   Resp 14   LMP 10/16/2011   SpO2 97%   Physical Exam Constitutional:      Appearance: Normal appearance.  HENT:     Head: Normocephalic.     Comments: Small 2 cm laceration present on the right upper cheek.  2 sutures in place with no dehiscence or drainage.  No surrounding erythema or edema. Neurological:     Mental Status: She is alert and oriented to person, place, and time.     (all labs ordered are listed, but only abnormal results are displayed) Labs Reviewed - No data to display  EKG: None  Radiology: No results found.   Suture Removal  Date/Time: 09/26/2024 5:28 PM  Performed by: Neysa Thersia RAMAN, PA-C Authorized  by: Neysa Thersia RAMAN, PA-C   Consent:    Consent obtained:  Verbal   Consent given by:  Patient   Risks discussed:  Bleeding Location:    Location:  Head/neck   Head/neck location:  Cheek   Cheek location:  R cheek Procedure details:    Wound appearance:  No signs of infection and good wound healing   Number of sutures removed:  2 Post-procedure details:    Post-removal:  No dressing applied   Procedure completion:  Tolerated well, no immediate complications    Medications Ordered in the ED - No data to display                                 Medical Decision Making Patient is a 63 year old female who presents to the ED for suture removal for a laceration to the right cheek that she believes occurred approximately 2 weeks  ago.  Review of patient's chart reveals sutures were placed at an ED visit on 08/30/2024.  Exam patient is alert and well-appearing.  Physical exam as noted above.  Wound has healed well with no signs of surrounding cellulitis, dehiscence, or drainage.  2 sutures removed as patient had already removed 1 suture on her own.  No dehiscence noted after removal.  Otherwise well-appearing and vital signs stable.  Stable for discharge home.  Wound care and scar instructions provided.  Advised PCP follow-up in 1 to 2 weeks for any continued concerns.  Return precautions provided.       Final diagnoses:  Visit for suture removal    ED Discharge Orders     None          Neysa Thersia RAMAN, [place] 09/26/24 1732    Dreama Longs, MD 09/27/24 2156

## 2024-09-26 NOTE — ED Notes (Signed)
 SUTURE REMOVAL KIT AT BEDSIDE

## 2024-09-26 NOTE — ED Triage Notes (Signed)
 States she has stitches on R side of cheek for 2 weeks. Needs them removed  Pt stumbling, slurring speech, has inappropriate reactions/responses.  States speech and stumbling is baseline for 6 months

## 2024-09-26 NOTE — Discharge Instructions (Addendum)
 Apply sunscreen to the face to help with any scarring.  May use cocoa butter or Aquaphor to the area to help as well.  Follow-up with PCP for any continued concerns.  Return to ED for any signs of infection to the area.

## 2024-09-27 ENCOUNTER — Telehealth: Payer: Self-pay | Admitting: Physician Assistant

## 2024-09-27 NOTE — Telephone Encounter (Signed)
 Patient's husband called in for refill on Olazapine 20mg . He also inquried about an increase in dosage. He thinks that at last appt TH said medication could be increased. If so Becky Wolfe would like to increase. Ph: [phone] appt 12/9 Pharmacy [facility] [address] [place]

## 2024-09-28 NOTE — Telephone Encounter (Signed)
 The 10/24 Rx for olanzapine  was picked up yesterday and it was for an increased dose. LVM to RC.

## 2024-09-29 NOTE — Progress Notes (Signed)
 [name]                                          MRN: [id number]   09/29/2024   The VBCI Quality Team Specialist reviewed this patient medical record for the purposes of chart review for care gap closure. The following were reviewed: chart review for care gap closure-breast cancer screening.    VBCI Quality Team

## 2024-10-08 ENCOUNTER — Emergency Department (HOSPITAL_BASED_OUTPATIENT_CLINIC_OR_DEPARTMENT_OTHER)

## 2024-10-08 ENCOUNTER — Emergency Department (HOSPITAL_BASED_OUTPATIENT_CLINIC_OR_DEPARTMENT_OTHER)
Admission: EM | Admit: 2024-10-08 | Discharge: 2024-10-08 | Disposition: A | Discharge status: Home / Self Care | Attending: Emergency Medicine | Admitting: Emergency Medicine

## 2024-10-08 ENCOUNTER — Encounter (HOSPITAL_BASED_OUTPATIENT_CLINIC_OR_DEPARTMENT_OTHER): Payer: Self-pay

## 2024-10-08 ENCOUNTER — Other Ambulatory Visit: Payer: Self-pay

## 2024-10-08 DIAGNOSIS — J069 Acute upper respiratory infection, unspecified: Secondary | ICD-10-CM | POA: Insufficient documentation

## 2024-10-08 DIAGNOSIS — Z7982 Long term (current) use of aspirin: Secondary | ICD-10-CM | POA: Diagnosis not present

## 2024-10-08 DIAGNOSIS — Z7901 Long term (current) use of anticoagulants: Secondary | ICD-10-CM | POA: Insufficient documentation

## 2024-10-08 DIAGNOSIS — R059 Cough, unspecified: Secondary | ICD-10-CM | POA: Diagnosis present

## 2024-10-08 LAB — RESP PANEL BY RT-PCR (RSV, FLU A&B, COVID)  RVPGX2
Influenza A by PCR: NEGATIVE
Influenza B by PCR: NEGATIVE
Resp Syncytial Virus by PCR: NEGATIVE
SARS Coronavirus 2 by RT PCR: NEGATIVE

## 2024-10-08 MED ORDER — DOXYCYCLINE HYCLATE 100 MG PO CAPS: 100.0000 mg | ORAL_CAPSULE | Freq: Two times a day (BID) | ORAL | 0 refills | Status: AC

## 2024-10-08 NOTE — ED Triage Notes (Signed)
 Pt reports productive cough X 1.5 weeks. Generalized body aches & weakness.

## 2024-10-08 NOTE — ED Provider Notes (Signed)
 [place] [facility] Provider Note   CSN: [id number] Arrival date & time: 10/08/24  1814     Patient presents with: Cough   Becky Wolfe is a 63 y.o. female.   63 year old female with a past medical history of TIA, anxiety, depression, hepatitis C presents to the ED with a chief complaint of cough, body aches, fatigue over the past 2 weeks.  Patient reports her symptoms have been ongoing, she has not felt any better.  She does endorse a productive cough with some clear sputum.  She does endorse tobacco use however has stopped smoking since she did not receive.  She is concerned as she has no relief with it and overall really tired.  She does not have any underlying COPD.  There is no alleviating or remitting factors.  No fever, no chest pain, no leg swelling.  The history is provided by the patient.  Cough Cough characteristics:  Productive Sputum characteristics:  Clear Severity:  Moderate Onset quality:  Gradual Duration:  3 days Timing:  Constant Progression:  Unchanged Chronicity:  New Smoker: yes   Relieved by:  Nothing Worsened by:  Deep breathing Associated symptoms: chills   Associated symptoms: no chest pain and no sore throat        Prior to Admission medications   Medication Sig Start Date End Date Taking? Authorizing Provider  doxycycline (VIBRAMYCIN) 100 MG capsule Take 1 capsule (100 mg total) by mouth 2 (two) times daily for 7 days. 10/08/24 10/15/24 Yes Wing Schoch, PA-C  aspirin  81 MG chewable tablet Chew 81 mg by mouth daily.    [provider]  buPROPion  (WELLBUTRIN  XL) 150 MG 24 hr tablet Take 150 mg by mouth daily.    [provider]  clonazePAM  (KLONOPIN ) 0.5 MG tablet TAKE 1 TABLET(0.5 MG) BY MOUTH TWICE DAILY AS NEEDED FOR ANXIETY 09/15/24   Hurst, Verneita T, PA-C  cloNIDine  (CATAPRES ) 0.1 MG tablet Take 1 tablet (0.1 mg total) by mouth 2 (two) times daily. 02/14/24   Cindy Garnette POUR, MD   clopidogrel  (PLAVIX ) 75 MG tablet TAKE 1 TABLET(75 MG) BY MOUTH DAILY 09/06/24   Sater, Charlie LABOR, MD  Collagen-Vitamin C-Biotin (COLLAGEN PO) Take 1 Dose by mouth daily.    [provider]  gabapentin  (NEURONTIN ) 400 MG capsule Take 1 capsule (400 mg total) by mouth 4 (four) times daily. 02/19/24   Rhys Verneita T, PA-C  hydrOXYzine  (ATARAX ) 25 MG tablet Take 1 tablet (25 mg total) by mouth 3 (three) times daily as needed. 06/22/24   Sater, Charlie LABOR, MD  OLANZapine  (ZYPREXA ) 20 MG tablet Take 1.5 tablets (30 mg total) by mouth at bedtime. 09/03/24   Rhys Verneita T, PA-C  oxyCODONE -acetaminophen  (PERCOCET) 10-325 MG tablet Take 1 tablet by mouth 4 (four) times daily as needed for pain. 07/21/24   [provider]  QUEtiapine  (SEROQUEL  XR) 400 MG 24 hr tablet Take 2 tablets (800 mg total) by mouth at bedtime. 05/11/24   Rhys Verneita T, PA-C  sertraline  (ZOLOFT ) 100 MG tablet TAKE 2 TABLETS(200 MG) BY MOUTH DAILY 05/11/24   Rhys Verneita T, PA-C    Allergies: Etodolac, Ambien  [zolpidem  tartrate], and Namenda [memantine]    Review of Systems  Constitutional:  Positive for chills and fatigue.  HENT:  Negative for sore throat.   Respiratory:  Positive for cough.   Cardiovascular:  Negative for chest pain, palpitations and leg swelling.  Gastrointestinal:  Negative for abdominal pain, diarrhea and vomiting.  Genitourinary:  Negative for flank pain.  Musculoskeletal:  Negative for back pain.  All other systems reviewed and are negative.   Updated Vital Signs BP (!) 131/97 (BP Location: Right Arm)   Pulse 85   Temp 98.4 F (36.9 C) (Oral)   Resp 18   LMP 10/16/2011   SpO2 97%   Physical Exam Vitals and nursing note reviewed.  Constitutional:      Appearance: Normal appearance. She is not ill-appearing.  HENT:     Head: Normocephalic and atraumatic.     Mouth/Throat:     Mouth: Mucous membranes are dry.     Pharynx: No oropharyngeal exudate or posterior oropharyngeal  erythema.  Eyes:     Pupils: Pupils are equal, round, and reactive to light.  Cardiovascular:     Rate and Rhythm: Normal rate.  Pulmonary:     Effort: Pulmonary effort is normal.     Breath sounds: No wheezing.  Abdominal:     General: Abdomen is flat.  Musculoskeletal:     Cervical back: Normal range of motion and neck supple.  Skin:    General: Skin is warm and dry.  Neurological:     Mental Status: She is alert and oriented to person, place, and time.     (all labs ordered are listed, but only abnormal results are displayed) Labs Reviewed  RESP PANEL BY RT-PCR (RSV, FLU A&B, COVID)  [id number]    EKG: None  Radiology: DG Chest 2 View Result Date: 10/08/2024 EXAM: 2 VIEW(S) XRAY OF THE CHEST 10/08/2024 06:52:04 PM COMPARISON: 08/30/2024 CLINICAL HISTORY: chest pain FINDINGS: LUNGS AND PLEURA: Bony or scarring or atelectasis in the left mid lung. Otherwise no focal pulmonary opacity. No pleural effusion. No pneumothorax. HEART AND MEDIASTINUM: No acute abnormality of the cardiac and mediastinal silhouettes. BONES AND SOFT TISSUES: Cervicothoracic hardware noted. IMPRESSION: 1. No acute cardiopulmonary process. Electronically signed by: Norman Gatlin MD 10/08/2024 07:07 PM EST RP Workstation: [id number]     Procedures   Medications Ordered in the ED - No data to display                                  Medical Decision Making Amount and/or Complexity of Data Reviewed Radiology: ordered.   Patient with no underlying COPD presents to the ED with a chief complaint of cough, body aches, fatigue over the past week and a half.  She reports ongoing clear cough, she does smoke however has not done so since she got sick.  She states she just has been feeling very fatigued.  Husband at the bedside reports that she has not been wanting to get out of bed.  Respiratory panel today showed negative flu, influenza, RSV.  Exam is benign without any wheezing rhonchi or rails.  There is no  pitting edema bilaterally.  Her oropharynx is clear as well.  Abdomen is soft nontender to palpation.  Stick obtained did not show any signs of pneumonia.  Did discuss results with patient.  Due to her age along with comorbidities I did recommend lab work at this time.  However patient refused that she reports that she will trial antibiotics instead of blood work at this time.  Decision making will treat her symptomatically for pneumonia with a short course of doxycycline.  She is agreeable to plan of treatment at this time.  Hemodynamically stable for discharge.   Portions of this note were generated  with Scientist, clinical (histocompatibility and immunogenetics). Dictation errors may occur despite best attempts at proofreading.   Final diagnoses:  Viral URI with cough    ED Discharge Orders          Ordered    doxycycline (VIBRAMYCIN) 100 MG capsule  2 times daily        10/08/24 1934               Anzleigh Slaven, PA-C 10/08/24 1939    Cottie Donnice PARAS, MD 10/08/24 2155

## 2024-10-08 NOTE — ED Notes (Signed)

## 2024-10-08 NOTE — Discharge Instructions (Signed)
 Your respiratory panel was negative for influenza, COVID-19, RSV.  We discussed treating you for pneumonia at this time as her symptoms have been ongoing for more than a week and a half.  I prescribed 1 tablet twice a day for the next 7 days.  If you experience any worsening symptoms in the next couple days, please return to the emergency department for blood work as we discussed.

## 2024-10-08 NOTE — ED Notes (Signed)
 Body aches and other viral symptoms, post nasal drip and sore throat. 10 days.

## 2024-10-11 ENCOUNTER — Other Ambulatory Visit: Payer: Self-pay | Admitting: Neurology

## 2024-10-11 DIAGNOSIS — F418 Other specified anxiety disorders: Secondary | ICD-10-CM

## 2024-10-13 ENCOUNTER — Ambulatory Visit
Admission: RE | Admit: 2024-10-13 | Discharge: 2024-10-13 | Disposition: A | Source: Ambulatory Visit | Attending: Obstetrics and Gynecology | Admitting: Obstetrics and Gynecology

## 2024-10-13 ENCOUNTER — Inpatient Hospital Stay
Admission: RE | Admit: 2024-10-13 | Discharge: 2024-10-13 | Disposition: A | Source: Ambulatory Visit | Attending: Obstetrics and Gynecology | Admitting: Obstetrics and Gynecology

## 2024-10-13 DIAGNOSIS — E041 Nontoxic single thyroid nodule: Secondary | ICD-10-CM

## 2024-10-13 DIAGNOSIS — Z8249 Family history of ischemic heart disease and other diseases of the circulatory system: Secondary | ICD-10-CM

## 2024-10-14 ENCOUNTER — Telehealth: Payer: Self-pay | Admitting: Physician Assistant

## 2024-10-14 NOTE — Telephone Encounter (Signed)
 Patient called in for refill on Quetiapine  400mg . Ph: [phone] Appt 12/9 Pharmacy [facility] [address] [place]

## 2024-10-14 NOTE — Telephone Encounter (Signed)
 Call pharmacy to get fill history.

## 2024-10-15 MED ORDER — QUETIAPINE FUMARATE ER 400 MG PO TB24: 800.0000 mg | ORAL_TABLET | Freq: Every day | ORAL | 0 refills | Status: AC

## 2024-10-15 NOTE — Telephone Encounter (Signed)
 Rx sent.

## 2024-10-15 NOTE — Telephone Encounter (Signed)
 Last 2 RF - 8/31, 10/1

## 2024-10-18 ENCOUNTER — Other Ambulatory Visit (HOSPITAL_COMMUNITY): Payer: Self-pay

## 2024-10-18 ENCOUNTER — Other Ambulatory Visit: Payer: Self-pay | Admitting: Obstetrics and Gynecology

## 2024-10-18 ENCOUNTER — Telehealth: Payer: Self-pay

## 2024-10-18 DIAGNOSIS — R931 Abnormal findings on diagnostic imaging of heart and coronary circulation: Secondary | ICD-10-CM

## 2024-10-18 NOTE — Telephone Encounter (Signed)
 Pharmacy Patient Advocate Encounter  Insurance verification completed.   The patient is insured through [employer] claim for [employer]. Currently a quantity of 84 is a 28 day supply and will need a PA . Generic Epclusa will need a PA   This test claim was processed through [facility]- copay amounts may vary at other pharmacies due to pharmacy/plan contracts, or as the patient moves through the different stages of their insurance plan.

## 2024-10-19 ENCOUNTER — Ambulatory Visit: Admitting: Physician Assistant

## 2024-10-20 NOTE — Progress Notes (Signed)
 She was over 15 minutes late for her appointment.  Not seen.  R/S

## 2024-10-26 ENCOUNTER — Encounter: Admitting: Infectious Diseases

## 2024-10-28 ENCOUNTER — Inpatient Hospital Stay
Admission: RE | Admit: 2024-10-28 | Discharge: 2024-10-28 | Attending: Obstetrics and Gynecology | Admitting: Obstetrics and Gynecology

## 2024-10-28 DIAGNOSIS — R931 Abnormal findings on diagnostic imaging of heart and coronary circulation: Secondary | ICD-10-CM

## 2024-11-09 ENCOUNTER — Other Ambulatory Visit: Payer: Self-pay | Admitting: Physician Assistant

## 2024-11-15 ENCOUNTER — Other Ambulatory Visit: Payer: Self-pay | Admitting: Physician Assistant

## 2024-11-15 DIAGNOSIS — F411 Generalized anxiety disorder: Secondary | ICD-10-CM

## 2024-11-16 ENCOUNTER — Other Ambulatory Visit: Payer: Self-pay | Admitting: Physician Assistant

## 2024-11-24 ENCOUNTER — Encounter: Payer: Self-pay | Admitting: Physician Assistant

## 2024-11-24 ENCOUNTER — Ambulatory Visit: Admitting: Physician Assistant

## 2024-11-24 DIAGNOSIS — F3341 Major depressive disorder, recurrent, in partial remission: Secondary | ICD-10-CM

## 2024-11-24 DIAGNOSIS — F4310 Post-traumatic stress disorder, unspecified: Secondary | ICD-10-CM | POA: Diagnosis not present

## 2024-11-24 DIAGNOSIS — F411 Generalized anxiety disorder: Secondary | ICD-10-CM

## 2024-11-24 DIAGNOSIS — F22 Delusional disorders: Secondary | ICD-10-CM

## 2024-11-24 DIAGNOSIS — R443 Hallucinations, unspecified: Secondary | ICD-10-CM | POA: Diagnosis not present
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
## 2024-11-27 ENCOUNTER — Other Ambulatory Visit: Payer: Self-pay | Admitting: Physician Assistant

## 2024-11-29 ENCOUNTER — Other Ambulatory Visit: Payer: Self-pay | Admitting: Neurology

## 2024-11-30 NOTE — Telephone Encounter (Signed)
 Last seen on 05/28/24 Follow up scheduled on 05/24/25   Did you want patient to continue Plavix  75 mg. I didn't see it mentioned in recent office note.   Rx is pending

## 2024-12-10 ENCOUNTER — Other Ambulatory Visit: Payer: Self-pay | Admitting: Nurse Practitioner

## 2024-12-10 ENCOUNTER — Other Ambulatory Visit: Payer: Self-pay | Admitting: Physician Assistant

## 2024-12-10 DIAGNOSIS — K7469 Other cirrhosis of liver: Secondary | ICD-10-CM

## 2024-12-10 DIAGNOSIS — F411 Generalized anxiety disorder: Secondary | ICD-10-CM

## 2024-12-12 ENCOUNTER — Other Ambulatory Visit: Payer: Self-pay | Admitting: Physician Assistant

## 2024-12-13 ENCOUNTER — Ambulatory Visit: Admitting: Cardiovascular Disease

## 2024-12-13 NOTE — Telephone Encounter (Signed)
 Left message with Husband to have [facility] call me back, trying to send med but says it should go to [facility].

## 2024-12-13 NOTE — Telephone Encounter (Signed)
 Spoke to [place], wants to keep meds at [facility]

## 2024-12-24 ENCOUNTER — Other Ambulatory Visit

## 2024-12-28 ENCOUNTER — Ambulatory Visit: Admitting: Cardiovascular Disease

## 2025-02-22 ENCOUNTER — Ambulatory Visit (INDEPENDENT_AMBULATORY_CARE_PROVIDER_SITE_OTHER): Admitting: Physician Assistant

## 2025-05-24 ENCOUNTER — Ambulatory Visit: Admitting: Neurology
